# Patient Record
Sex: Female | Born: 1970
Health system: Southern US, Community
[De-identification: ages and names within clinical notes are randomized; demographics above are authoritative.]

## PROBLEM LIST (undated history)

## (undated) DIAGNOSIS — T7840XA Allergy, unspecified, initial encounter: Secondary | ICD-10-CM

## (undated) DIAGNOSIS — M51369 Other intervertebral disc degeneration, lumbar region without mention of lumbar back pain or lower extremity pain: Secondary | ICD-10-CM

## (undated) DIAGNOSIS — I471 Supraventricular tachycardia, unspecified: Secondary | ICD-10-CM

## (undated) DIAGNOSIS — M489 Spondylopathy, unspecified: Secondary | ICD-10-CM

## (undated) DIAGNOSIS — J189 Pneumonia, unspecified organism: Secondary | ICD-10-CM

## (undated) DIAGNOSIS — R634 Abnormal weight loss: Secondary | ICD-10-CM

## (undated) DIAGNOSIS — G43909 Migraine, unspecified, not intractable, without status migrainosus: Secondary | ICD-10-CM

## (undated) DIAGNOSIS — M1990 Unspecified osteoarthritis, unspecified site: Secondary | ICD-10-CM

## (undated) DIAGNOSIS — M5136 Other intervertebral disc degeneration, lumbar region: Secondary | ICD-10-CM

## (undated) DIAGNOSIS — I8390 Asymptomatic varicose veins of unspecified lower extremity: Secondary | ICD-10-CM

## (undated) DIAGNOSIS — I10 Essential (primary) hypertension: Secondary | ICD-10-CM

## (undated) DIAGNOSIS — M539 Dorsopathy, unspecified: Secondary | ICD-10-CM

## (undated) DIAGNOSIS — Z9889 Other specified postprocedural states: Secondary | ICD-10-CM

## (undated) DIAGNOSIS — E739 Lactose intolerance, unspecified: Secondary | ICD-10-CM

## (undated) DIAGNOSIS — M549 Dorsalgia, unspecified: Secondary | ICD-10-CM

## (undated) DIAGNOSIS — E119 Type 2 diabetes mellitus without complications: Secondary | ICD-10-CM

## (undated) DIAGNOSIS — D649 Anemia, unspecified: Secondary | ICD-10-CM

## (undated) DIAGNOSIS — M5126 Other intervertebral disc displacement, lumbar region: Secondary | ICD-10-CM

## (undated) DIAGNOSIS — K219 Gastro-esophageal reflux disease without esophagitis: Secondary | ICD-10-CM

## (undated) DIAGNOSIS — D689 Coagulation defect, unspecified: Secondary | ICD-10-CM

## (undated) DIAGNOSIS — E559 Vitamin D deficiency, unspecified: Secondary | ICD-10-CM

## (undated) DIAGNOSIS — M2550 Pain in unspecified joint: Secondary | ICD-10-CM

## (undated) DIAGNOSIS — E669 Obesity, unspecified: Secondary | ICD-10-CM

## (undated) DIAGNOSIS — M533 Sacrococcygeal disorders, not elsewhere classified: Secondary | ICD-10-CM

## (undated) DIAGNOSIS — R112 Nausea with vomiting, unspecified: Secondary | ICD-10-CM

## (undated) HISTORY — DX: Other intervertebral disc displacement, lumbar region: M51.26

## (undated) HISTORY — DX: Type 2 diabetes mellitus without complications: E11.9

## (undated) HISTORY — DX: Unspecified osteoarthritis, unspecified site: M19.90

## (undated) HISTORY — DX: Essential (primary) hypertension: I10

## (undated) HISTORY — DX: Supraventricular tachycardia, unspecified: I47.10

## (undated) HISTORY — DX: Pneumonia, unspecified organism: J18.9

## (undated) HISTORY — DX: Other intervertebral disc degeneration, lumbar region: M51.36

## (undated) HISTORY — DX: Dorsopathy, unspecified: M53.9

## (undated) HISTORY — DX: Dorsalgia, unspecified: M54.9

## (undated) HISTORY — DX: Other intervertebral disc degeneration, lumbar region without mention of lumbar back pain or lower extremity pain: M51.369

## (undated) HISTORY — DX: Allergy, unspecified, initial encounter: T78.40XA

## (undated) HISTORY — DX: Pain in unspecified joint: M25.50

## (undated) HISTORY — DX: Abnormal weight loss: R63.4

## (undated) HISTORY — DX: Vitamin D deficiency, unspecified: E55.9

## (undated) HISTORY — DX: Lactose intolerance, unspecified: E73.9

## (undated) HISTORY — DX: Sacrococcygeal disorders, not elsewhere classified: M53.3

## (undated) HISTORY — DX: Asymptomatic varicose veins of unspecified lower extremity: I83.90

## (undated) HISTORY — DX: Supraventricular tachycardia: I47.1

## (undated) HISTORY — DX: Obesity, unspecified: E66.9

## (undated) HISTORY — DX: Migraine, unspecified, not intractable, without status migrainosus: G43.909

## (undated) HISTORY — DX: Coagulation defect, unspecified: D68.9

## (undated) HISTORY — DX: Spondylopathy, unspecified: M48.9

## (undated) HISTORY — DX: Gastro-esophageal reflux disease without esophagitis: K21.9

---

## 1994-06-22 HISTORY — PX: TUBAL LIGATION: SHX77

## 2001-09-20 ENCOUNTER — Encounter: Payer: Self-pay | Admitting: Emergency Medicine

## 2001-09-20 ENCOUNTER — Emergency Department (HOSPITAL_COMMUNITY): Admission: EM | Admit: 2001-09-20 | Discharge: 2001-09-20 | Payer: Self-pay | Admitting: *Deleted

## 2002-06-05 ENCOUNTER — Encounter: Payer: Self-pay | Admitting: Otolaryngology

## 2002-06-05 ENCOUNTER — Ambulatory Visit (HOSPITAL_COMMUNITY): Admission: RE | Admit: 2002-06-05 | Discharge: 2002-06-05 | Payer: Self-pay | Admitting: Otolaryngology

## 2004-03-06 ENCOUNTER — Emergency Department (HOSPITAL_COMMUNITY): Admission: EM | Admit: 2004-03-06 | Discharge: 2004-03-06 | Payer: Self-pay | Admitting: Emergency Medicine

## 2004-05-02 ENCOUNTER — Emergency Department (HOSPITAL_COMMUNITY): Admission: EM | Admit: 2004-05-02 | Discharge: 2004-05-02 | Payer: Self-pay | Admitting: Emergency Medicine

## 2004-05-13 ENCOUNTER — Ambulatory Visit (HOSPITAL_COMMUNITY): Admission: RE | Admit: 2004-05-13 | Discharge: 2004-05-13 | Payer: Self-pay | Admitting: Internal Medicine

## 2004-12-19 ENCOUNTER — Encounter: Admission: RE | Admit: 2004-12-19 | Discharge: 2004-12-19 | Payer: Self-pay | Admitting: Internal Medicine

## 2005-06-06 ENCOUNTER — Emergency Department (HOSPITAL_COMMUNITY): Admission: EM | Admit: 2005-06-06 | Discharge: 2005-06-06 | Payer: Self-pay | Admitting: Emergency Medicine

## 2005-06-11 ENCOUNTER — Ambulatory Visit: Payer: Self-pay | Admitting: Orthopedic Surgery

## 2005-06-19 ENCOUNTER — Ambulatory Visit (HOSPITAL_COMMUNITY): Admission: RE | Admit: 2005-06-19 | Discharge: 2005-06-19 | Payer: Self-pay | Admitting: Orthopaedic Surgery

## 2005-06-19 ENCOUNTER — Encounter: Payer: Self-pay | Admitting: Family Medicine

## 2005-06-22 DIAGNOSIS — J189 Pneumonia, unspecified organism: Secondary | ICD-10-CM

## 2005-06-22 DIAGNOSIS — G43909 Migraine, unspecified, not intractable, without status migrainosus: Secondary | ICD-10-CM

## 2005-06-22 HISTORY — DX: Migraine, unspecified, not intractable, without status migrainosus: G43.909

## 2005-06-22 HISTORY — DX: Pneumonia, unspecified organism: J18.9

## 2005-07-02 ENCOUNTER — Ambulatory Visit: Payer: Self-pay | Admitting: Orthopedic Surgery

## 2005-08-21 ENCOUNTER — Ambulatory Visit (HOSPITAL_COMMUNITY): Admission: RE | Admit: 2005-08-21 | Discharge: 2005-08-21 | Payer: Self-pay | Admitting: Neurosurgery

## 2005-08-21 ENCOUNTER — Encounter: Payer: Self-pay | Admitting: Family Medicine

## 2005-09-08 ENCOUNTER — Inpatient Hospital Stay (HOSPITAL_COMMUNITY): Admission: EM | Admit: 2005-09-08 | Discharge: 2005-09-15 | Payer: Self-pay | Admitting: Emergency Medicine

## 2005-09-08 ENCOUNTER — Ambulatory Visit: Payer: Self-pay | Admitting: Cardiology

## 2005-09-08 ENCOUNTER — Ambulatory Visit: Payer: Self-pay | Admitting: Internal Medicine

## 2005-09-10 ENCOUNTER — Encounter: Payer: Self-pay | Admitting: Family Medicine

## 2005-09-11 ENCOUNTER — Encounter (INDEPENDENT_AMBULATORY_CARE_PROVIDER_SITE_OTHER): Payer: Self-pay | Admitting: Specialist

## 2005-09-15 ENCOUNTER — Encounter: Payer: Self-pay | Admitting: Family Medicine

## 2005-09-22 ENCOUNTER — Ambulatory Visit (HOSPITAL_COMMUNITY): Admission: RE | Admit: 2005-09-22 | Discharge: 2005-09-22 | Payer: Self-pay | Admitting: Internal Medicine

## 2005-10-05 ENCOUNTER — Ambulatory Visit: Payer: Self-pay | Admitting: Internal Medicine

## 2005-10-13 ENCOUNTER — Encounter (HOSPITAL_COMMUNITY): Admission: RE | Admit: 2005-10-13 | Discharge: 2005-11-12 | Payer: Self-pay | Admitting: Neurology

## 2005-10-13 ENCOUNTER — Ambulatory Visit (HOSPITAL_COMMUNITY): Payer: Self-pay | Admitting: Neurology

## 2005-10-21 ENCOUNTER — Emergency Department (HOSPITAL_COMMUNITY): Admission: EM | Admit: 2005-10-21 | Discharge: 2005-10-21 | Payer: Self-pay | Admitting: Emergency Medicine

## 2006-04-20 ENCOUNTER — Emergency Department (HOSPITAL_COMMUNITY): Admission: EM | Admit: 2006-04-20 | Discharge: 2006-04-20 | Payer: Self-pay | Admitting: Emergency Medicine

## 2008-06-10 ENCOUNTER — Emergency Department (HOSPITAL_COMMUNITY): Admission: EM | Admit: 2008-06-10 | Discharge: 2008-06-10 | Payer: Self-pay | Admitting: Emergency Medicine

## 2009-04-24 ENCOUNTER — Encounter (INDEPENDENT_AMBULATORY_CARE_PROVIDER_SITE_OTHER): Payer: Self-pay | Admitting: *Deleted

## 2009-04-24 ENCOUNTER — Emergency Department (HOSPITAL_COMMUNITY): Admission: EM | Admit: 2009-04-24 | Discharge: 2009-04-24 | Payer: Self-pay | Admitting: Emergency Medicine

## 2009-04-24 LAB — CONVERTED CEMR LAB
BUN: 13 mg/dL
CO2: 24 meq/L
Creatinine, Ser: 0.84 mg/dL
GFR calc non Af Amer: >60 mL/min
Glomerular Filtration Rate, Af Am: >60 mL/min/{1.73_m2}
Glucose, Bld: 91 mg/dL
HCT: 44.3 %
Hemoglobin: 15 g/dL
Sodium: 139 meq/L
Troponin I: <0.05 ng/mL
WBC: 7.5 10*3/uL

## 2009-05-02 DIAGNOSIS — I152 Hypertension secondary to endocrine disorders: Secondary | ICD-10-CM | POA: Insufficient documentation

## 2009-05-02 DIAGNOSIS — J189 Pneumonia, unspecified organism: Secondary | ICD-10-CM | POA: Insufficient documentation

## 2009-05-02 DIAGNOSIS — I10 Essential (primary) hypertension: Secondary | ICD-10-CM | POA: Insufficient documentation

## 2009-05-02 DIAGNOSIS — I498 Other specified cardiac arrhythmias: Secondary | ICD-10-CM | POA: Insufficient documentation

## 2009-05-02 DIAGNOSIS — E1159 Type 2 diabetes mellitus with other circulatory complications: Secondary | ICD-10-CM | POA: Insufficient documentation

## 2009-05-02 DIAGNOSIS — R634 Abnormal weight loss: Secondary | ICD-10-CM | POA: Insufficient documentation

## 2009-05-03 ENCOUNTER — Encounter (INDEPENDENT_AMBULATORY_CARE_PROVIDER_SITE_OTHER): Payer: Self-pay | Admitting: *Deleted

## 2010-02-10 ENCOUNTER — Ambulatory Visit: Payer: Self-pay | Admitting: Family Medicine

## 2010-02-10 DIAGNOSIS — R1013 Epigastric pain: Secondary | ICD-10-CM

## 2010-02-10 DIAGNOSIS — R5383 Other fatigue: Secondary | ICD-10-CM | POA: Insufficient documentation

## 2010-02-10 DIAGNOSIS — E669 Obesity, unspecified: Secondary | ICD-10-CM | POA: Insufficient documentation

## 2010-02-10 DIAGNOSIS — N926 Irregular menstruation, unspecified: Secondary | ICD-10-CM | POA: Insufficient documentation

## 2010-02-10 DIAGNOSIS — K3189 Other diseases of stomach and duodenum: Secondary | ICD-10-CM | POA: Insufficient documentation

## 2010-02-10 DIAGNOSIS — R5381 Other malaise: Secondary | ICD-10-CM | POA: Insufficient documentation

## 2010-02-10 DIAGNOSIS — E049 Nontoxic goiter, unspecified: Secondary | ICD-10-CM | POA: Insufficient documentation

## 2010-02-11 ENCOUNTER — Encounter: Payer: Self-pay | Admitting: Family Medicine

## 2010-02-16 DIAGNOSIS — L259 Unspecified contact dermatitis, unspecified cause: Secondary | ICD-10-CM | POA: Insufficient documentation

## 2010-02-16 DIAGNOSIS — R9409 Abnormal results of other function studies of central nervous system: Secondary | ICD-10-CM | POA: Insufficient documentation

## 2010-02-16 DIAGNOSIS — B359 Dermatophytosis, unspecified: Secondary | ICD-10-CM | POA: Insufficient documentation

## 2010-02-17 LAB — CONVERTED CEMR LAB
AST: 19 units/L (ref 0–37)
Albumin: 4.4 g/dL (ref 3.5–5.2)
Alkaline Phosphatase: 74 units/L (ref 39–117)
Bilirubin, Direct: 0.2 mg/dL (ref 0.0–0.3)
CO2: 23 meq/L (ref 19–32)
Calcium: 9.4 mg/dL (ref 8.4–10.5)
Chloride: 100 meq/L (ref 96–112)
Creatinine, Ser: 0.91 mg/dL (ref 0.40–1.20)
Glucose, Bld: 95 mg/dL (ref 70–99)
HDL: 37 mg/dL — ABNORMAL LOW (ref >39)
Helicobacter Pylori Antibody-IgG: 0.7
LDL Cholesterol: 91 mg/dL (ref 0–99)
TSH: 2.175 microintl units/mL (ref 0.350–4.500)
Total Bilirubin: 0.4 mg/dL (ref 0.3–1.2)

## 2010-02-25 ENCOUNTER — Ambulatory Visit (HOSPITAL_COMMUNITY): Admission: RE | Admit: 2010-02-25 | Discharge: 2010-02-25 | Payer: Self-pay | Admitting: Family Medicine

## 2010-03-13 ENCOUNTER — Encounter: Payer: Self-pay | Admitting: Family Medicine

## 2010-05-07 ENCOUNTER — Other Ambulatory Visit: Admission: RE | Admit: 2010-05-07 | Discharge: 2010-05-07 | Payer: Self-pay | Admitting: Family Medicine

## 2010-05-07 ENCOUNTER — Ambulatory Visit: Payer: Self-pay | Admitting: Family Medicine

## 2010-05-07 DIAGNOSIS — L909 Atrophic disorder of skin, unspecified: Secondary | ICD-10-CM | POA: Insufficient documentation

## 2010-05-07 DIAGNOSIS — L919 Hypertrophic disorder of the skin, unspecified: Secondary | ICD-10-CM

## 2010-05-12 ENCOUNTER — Ambulatory Visit (HOSPITAL_COMMUNITY): Admission: RE | Admit: 2010-05-12 | Discharge: 2010-05-12 | Payer: Self-pay | Admitting: Family Medicine

## 2010-05-12 LAB — CONVERTED CEMR LAB: Pap Smear: NEGATIVE

## 2010-05-13 ENCOUNTER — Encounter: Payer: Self-pay | Admitting: Family Medicine

## 2010-05-17 DIAGNOSIS — B369 Superficial mycosis, unspecified: Secondary | ICD-10-CM | POA: Insufficient documentation

## 2010-05-17 DIAGNOSIS — E1169 Type 2 diabetes mellitus with other specified complication: Secondary | ICD-10-CM | POA: Insufficient documentation

## 2010-05-17 DIAGNOSIS — E669 Obesity, unspecified: Secondary | ICD-10-CM

## 2010-05-20 ENCOUNTER — Ambulatory Visit (HOSPITAL_COMMUNITY): Admission: RE | Admit: 2010-05-20 | Discharge: 2010-05-20 | Payer: Self-pay | Admitting: Diagnostic Neuroimaging

## 2010-05-31 ENCOUNTER — Encounter: Payer: Self-pay | Admitting: Family Medicine

## 2010-07-13 ENCOUNTER — Encounter: Payer: Self-pay | Admitting: Family Medicine

## 2010-07-24 NOTE — Letter (Signed)
Summary: dermatology  dermatology   Imported By: Lind Guest 06/09/2010 13:19:26  _____________________________________________________________________  External Attachment:    Type:   Image     Comment:   External Document

## 2010-07-24 NOTE — Letter (Signed)
Summary: Discharge Summary  Discharge Summary   Imported By: Lind Guest 02/11/2010 08:48:39  _____________________________________________________________________  External Attachment:    Type:   Image     Comment:   External Document

## 2010-07-24 NOTE — Assessment & Plan Note (Signed)
Summary: new pt per dr. Lodema Hong   Vital Signs:  Patient profile:   40 year old female Menstrual status:  regular LMP:     01/20/2010 Height:      71 inches Weight:      280.25 pounds BMI:     39.23 O2 Sat:      98 % Pulse rate:   71 / minute Pulse rhythm:   regular Resp:     16 per minute BP sitting:   138 / 86  (left arm) Cuff size:   large  Vitals Entered By: Everitt Amber LPN (February 10, 2010 1:10 PM)  Nutrition Counseling: Patient's BMI is greater than 25 and therefore counseled on weight management options. CC: New patient  LMP (date): 01/20/2010     Menstrual Status regular Enter LMP: 01/20/2010   CC:  New patient .  History of Present Illness: New pt evaluation foir this pt with HTN,obesity, h/o abn brain scan whjo was told she had MS in the past. She has recently joinewd weight watchers and i9s doing very well so far. In the past she joined a physician directed weight loss program, lost an excessive amt of weight rapidly, developed protein malnutrtion and required hospitalisation asa result. She had multiple symptoms including abn in her neurologic sysytem, apart from a markedly abn brain scan she was told by 1 neurologist that she appeared to have mS, another told her no such thing. She denies vision disturbance , but reports intermittent tingling and numnbbness iun the extremity. She does HAVE A POSITIVR FAMILY H/O DIabetes, but has never been dx with this She has not started regular physical activity, butintends to add this to her weight loss plan  Current Medications (verified): 1)  Diovan Hct 160-12.5 Mg Tabs (Valsartan-Hydrochlorothiazide) .... Take 1 Tab Daily  Allergies (verified): 1)  ! Codeine 2)  ! Reglan  Past History:  Social History: Last updated: 02/10/2010 Married  x 14 years two daughters ages 74 and 40 Tobacco Use - No.  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  Risk  Factors: Exercise: no (05/02/2009)  Risk Factors: Smoking Status: never (05/02/2009)  Past Medical History: Current Problems:  HYPERTENSION (ICD-401.9) since 2001 SUPRAVENTRICULAR TACHYCARDIA (ICD-427.89) occured in 04/2009 on the job, pt was stressed WEIGHT LOSS (ICD-783.21), hospitalised 4 yrs ago aftyer a 200 pound weight loss in 1 year to 1256 pounds on a supervised program, malnourished with severe protein deficiency, spent 1 week PNEUMONIA (ICD-486) hospitalised in 2007 for this at the same time she was in for malnutrtion Obesity since childhood Migraine headaches, most recent 2007 posssible MS dx in 2007 but uncertain of this, maybe not proven Spinal disease  Past Surgical History: Tubal ligation x  1996  Family History: Mother-living-HTN, diabetes, had a heart blockage and uterine cancer-finished chemo last year, CABG x 1 in her 71's Father-Living-HTN, had prostate cancer age 26 in 2011, has bradycardia and an irregular rate 2 brothers-living-HTN, diabetes (both), one brother had tachycardia in his teens and required surgery to correct this , was done in his 60's in [place]  Social History: Married  x 14 years two daughters ages 22 and 21 Tobacco Use - No.  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 Eyes:  Denies blurring, discharge, eye pain, and red eye. ENT:  Denies hoarseness, nasal congestion, postnasal drainage, sinus pressure, and sore throat. CV:  Denies chest pain or discomfort, difficulty breathing while lying down, lightheadness, palpitations, shortness of breath with exertion, and swelling of feet. Resp:  Denies cough, sputum productive, and wheezing. GI:  Complains of abdominal pain; mild dyspeptic , lactose intolerant. GU:  Complains of abnormal vaginal bleeding; denies discharge, dysuria,  incontinence, and urinary frequency; heavy cycles on avg 5 to 7 days, flood x 3 days  needing double protection, spotting this month. MS:  Complains of low back pain; LBP sometime radiates to right lower ext over 15 yrs, flares intermittently, thinks since she was a teen. Derm:  Complains of itching and rash; bilateral puritic rasch underboth breasts. Neuro:  Complains of headaches, numbness, and tingling; h/o numbness in right leg several yrs ago and abn MRI brain, at one ime felt to have MS, occassional tingling still. Psych:  Denies anxiety, depression, and mental problems. Endo:  Denies cold intolerance, excessive thirst, excessive urination, and heat intolerance. Heme:  Denies abnormal bruising and bleeding. Allergy:  Complains of seasonal allergies; spring primarily pollemn aggravates.  Physical Exam  General:  Well-developed,obese,in no acute distress; alert,appropriate and cooperative throughout examination HEENT: No facial asymmetry,  EOMI, No sinus tenderness, TM's Clear, oropharynx  pink and moist.Goiter   Chest: Clear to auscultation bilaterally.  CVS: S1, S2, No murmurs, No S3.   Abd: Soft, Nontender.  MS: Adequate ROM spine, hips, shoulders and knees.  Ext: No edema.   CNS: CN 2-12 intact, power tone and sensation normal throughout.   Skin: Intact, fungal infection under breast Psych: Good eye contact, normal affect.  Memory intact, not anxious or depressed appearing.    Impression & Recommendations:  Problem # 1:  OTH ABNORMAL BRAIN & CNS FUNCTION STUDY (ICD-794.09) Assessment Comment Only  Orders: Radiology Referral (Radiology)  Problem # 2:  IMPAIRED FASTING GLUCOSE (ICD-790.21) Assessment: Comment Only  Orders: T- Hemoglobin A1C (83036-23375)6.54, will need to follow low carb diet , start regular physical activity and lose weight  Problem # 3:  GOITER, UNSPECIFIED (ICD-240.9) Assessment: Comment Only  Orders: Radiology Referral (Radiology)  Problem # 4:   IRREGULAR MENSES (ICD-626.4) Assessment: Comment Only  Orders: Radiology Referral (Radiology)  Problem # 5:  HYPERTENSION (ICD-401.9) Assessment: Comment Only  The following medications were removed from the medication list:    Diovan Hct 160-12.5 Mg Tabs (Valsartan-hydrochlorothiazide) .Marland Kitchen... Take 1 tab daily Her updated medication list for this problem includes:    Diovan Hct 160-25 Mg Tabs (Valsartan-hydrochlorothiazide) .Marland Kitchen... Take 1 tablet by mouth once a day  Orders: T-Basic Metabolic Panel [id number])  BP today: 138/86  Labs Reviewed: K+: 3.4 (04/24/2009) Creat: : 0.84 (04/24/2009)     Problem # 6:  DERMATOPHYTOSIS OF UNSPECIFIED SITE (ICD-110.9) Assessment: Comment Only clotrimazole/betameth prescribed  Complete Medication List: 1)  Diovan Hct 160-25 Mg Tabs (Valsartan-hydrochlorothiazide) .... Take 1 tablet by mouth once a day 2)  Clotrimazole-betamethasone 1-0.05 % Crea (Clotrimazole-betamethasone) .... Apply twice daily for 10 days to affected areas, then as needed  Other Orders: T-Hepatic Function [id number]) T-Lipid Profile [id number]) T-TSH [id number]) TLB-H. Pylori Abs(Helicobacter Pylori) ([id number])  Patient Instructions: 1)  CPE in3rd week in October. 2)  BMP prior to visit, ICD-9: 3)  Lipid Panel prior to visit, ICD-9: 4)  TSH prior to visit, ICD-9: 5)  CBC w/ Diff prior to visit, ICD-9:   fasting asap 6)  HbgA1C prior to visit, ICD-9: 7)  Vitamin D 8)  It is important that you exercise regularly at least 30 minutes 7 times a week. If you develop chest pain, have severe difficulty breathing, or feel very tired , stop exercising immediately and seek medical attention. 9)  You need to lose weight. Consider a lower calorie diet and regular exercise. Congrats  on new resolve to lose weight healthily. 10)  You are referred for mamo, pelvic and thyroid ultrasounds Prescriptions: CLOTRIMAZOLE-BETAMETHASONE 1-0.05 % CREA  (CLOTRIMAZOLE-BETAMETHASONE) apply twice daily for 10 days to affected areas, then as needed  #45 gm x 2   Entered and Authorized by:   Syliva Overman MD   Signed by:   Syliva Overman MD on 02/16/2010   Method used:   Historical   RxID:   [id number] DIOVAN HCT 160-25 MG TABS (VALSARTAN-HYDROCHLOROTHIAZIDE) Take 1 tablet by mouth once a day  #90 x 1   Entered and Authorized by:   Syliva Overman MD   Signed by:   Syliva Overman MD on 02/10/2010   Method used:   Print then Give to Patient   RxID:   [id number]

## 2010-07-24 NOTE — Miscellaneous (Signed)
  Clinical Lists Changes  Problems: Added new problem of OTH ABNORMAL BRAIN & CNS FUNCTION STUDY (ICD-794.09) Medications: Added new medication of PENICILLIN V POTASSIUM 500 MG TABS (PENICILLIN V POTASSIUM) Take 1 tablet by mouth three times a day - Signed Rx of PENICILLIN V POTASSIUM 500 MG TABS (PENICILLIN V POTASSIUM) Take 1 tablet by mouth three times a day;  #63 x 0;  Signed;  Entered by: Syliva Overman MD;  Authorized by: Syliva Overman MD;  Method used: Electronically to [facility] [address] [phone]*, [address], [place], [place]  [address], Ph: [phone], Fax: [phone] Orders: Added new Referral order of Neurology Referral (Neuro) - Signed    Prescriptions: PENICILLIN V POTASSIUM 500 MG TABS (PENICILLIN V POTASSIUM) Take 1 tablet by mouth three times a day  #63 x 0   Entered and Authorized by:   Syliva Overman MD   Signed by:   Syliva Overman MD on 03/13/2010   Method used:   Electronically to        [facility] [address] [phone]* (retail)       [address] [place]  [address]       Ph: [phone]       Fax: [phone]   RxID:   [id number]

## 2010-07-24 NOTE — Letter (Signed)
Summary: [facility]   Imported By: Lind Guest 05/14/2010 10:45:22  _____________________________________________________________________  External Attachment:    Type:   Image     Comment:   External Document

## 2010-07-24 NOTE — Assessment & Plan Note (Signed)
Summary: PHY   Vital Signs:  Patient profile:   40 year old female Menstrual status:  regular Height:      71 inches Weight:      262 pounds BMI:     36.67 O2 Sat:      94 % on Room air Pulse rate:   91 / minute Pulse rhythm:   irregular Resp:     16 per minute BP sitting:   104 / 80  (left arm)  Vitals Entered By: Mauricia Area CMA (May 07, 2010 2:31 PM)  Nutrition Counseling: Patient's BMI is greater than 25 and therefore counseled on weight management options.  O2 Flow:  Room air CC: physical  Vision Screening:Left eye w/o correction: 20 / 13 Right Eye w/o correction: 20 / 20 Both eyes w/o correction:  20/ 10        Vision Entered By: Mauricia Area CMA (May 07, 2010 2:40 PM)   CC:  physical.  History of Present Illness: Reports  that she has been doing very well. She has been fllowing a reduced calorie diet with excellent results. Denies recent fever or chills. Denies sinus pressure, nasal congestion , ear pain or sore throat. Denies chest congestion, or cough productive of sputum. Denies chest pain, palpitations, PND, orthopnea or leg swelling. Denies abdominal pain, nausea, vomitting, diarrhea or constipation. Denies change in bowel movements or bloody stool. Denies dysuria , frequency, incontinence or hesitancy. Denies  joint pain, swelling, or reduced mobility. Denies headaches, vertigo, seizures. Denies depression, anxiety or insomnia. c/o mutiple skin tags which she wants removed since they are irritating her.     Current Medications (verified): 1)  Diovan Hct 160-25 Mg Tabs (Valsartan-Hydrochlorothiazide) .... Take 1 Tablet By Mouth Once A Day 2)  Clotrimazole-Betamethasone 1-0.05 % Crea (Clotrimazole-Betamethasone) .... Apply Twice Daily For 10 Days To Affected Areas, Then As Needed  Allergies (verified): 1)  ! Codeine 2)  ! Reglan  Review of Systems      See HPI General:  Complains of fatigue. Eyes:  Denies blurring and  discharge. Derm:  Complains of lesion(s). Endo:  Complains of weight change; denies cold intolerance, excessive thirst, excessive urination, and polyuria; intentional weight loss. Heme:  Denies abnormal bruising and bleeding. Allergy:  Denies hives or rash, itching eyes, and seasonal allergies.  Physical Exam  General:  Well-developed,obese,in no acute distress; alert,appropriate and cooperative throughout examination Head:  Normocephalic and atraumatic without obvious abnormalities. No apparent alopecia or balding. Eyes:  No corneal or conjunctival inflammation noted. EOMI. Perrla. Funduscopic exam benign, without hemorrhages, exudates or papilledema. Vision grossly normal. Ears:  External ear exam shows no significant lesions or deformities.  Otoscopic examination reveals clear canals, tympanic membranes are intact bilaterally without bulging, retraction, inflammation or discharge. Hearing is grossly normal bilaterally. Nose:  External nasal examination shows no deformity or inflammation. Nasal mucosa are pink and moist without lesions or exudates. Mouth:  Oral mucosa and oropharynx without lesions or exudates.  Teeth in good repair. Neck:  No deformities, masses, or tenderness noted. Chest Wall:  No deformities, masses, or tenderness noted. Breasts:  No mass, nodules, thickening, tenderness, bulging, retraction, inflamation, nipple discharge or skin changes noted.   Lungs:  Normal respiratory effort, chest expands symmetrically. Lungs are clear to auscultation, no crackles or wheezes. Heart:  Normal rate and regular rhythm. S1 and S2 normal without gallop, murmur, click, rub or other extra sounds. Abdomen:  Bowel sounds positive,abdomen soft and non-tender without masses, organomegaly or hernias noted. Rectal:  No external abnormalities noted. Normal sphincter tone. No rectal masses or tenderness. Genitalia:  Normal introitus for age, no external lesions, no vaginal discharge, mucosa pink and  moist, no vaginal or cervical lesions, no vaginal atrophy, no friaility or hemorrhage, normal uterus size and position, no adnexal masses or tenderness Msk:  No deformity or scoliosis noted of thoracic or lumbar spine.   Pulses:  R and L carotid,radial,femoral,dorsalis pedis and posterior tibial pulses are full and equal bilaterally Extremities:  No clubbing, cyanosis, edema, or deformity noted with normal full range of motion of all joints.   Neurologic:  No cranial nerve deficits noted. Station and gait are normal. Plantar reflexes are down-going bilaterally. DTRs are symmetrical throughout. Sensory, motor and coordinative functions appear intact. Skin:  multiple skin tags, also tinea pedis Cervical Nodes:  No lymphadenopathy noted Axillary Nodes:  No palpable lymphadenopathy Inguinal Nodes:  No significant adenopathy Psych:  Cognition and judgment appear intact. Alert and cooperative with normal attention span and concentration. No apparent delusions, illusions, hallucinations   Impression & Recommendations:  Problem # 1:  SKIN TAG (ICD-701.9) Assessment Comment Only  Orders: Dermatology Referral (Derma)  Problem # 2:  HYPERTENSION (ICD-401.9) Assessment: Improved  Her updated medication list for this problem includes:    Diovan Hct 160-25 Mg Tabs (Valsartan-hydrochlorothiazide) .Marland Kitchen... Take 1 tablet by mouth once a day  BP today: 104/80 Prior BP: 138/86 (02/10/2010)  Labs Reviewed: K+: 4.3 (02/11/2010) Creat: : 0.91 (02/11/2010)   Chol: 159 (02/11/2010)   HDL: 37 (02/11/2010)   LDL: 91 (02/11/2010)   TG: 156 (02/11/2010)  Problem # 3:  OBESITY, UNSPECIFIED (ICD-278.00) Assessment: Improved  Ht: 71 (05/07/2010)   Wt: 262 (05/07/2010)   BMI: 36.67 (05/07/2010)  Problem # 4:  DERMATOMYCOSIS (ICD-111.9) Assessment: Comment Only  Her updated medication list for this problem includes:    Clotrimazole-betamethasone 1-0.05 % Crea (Clotrimazole-betamethasone) .Marland Kitchen... Apply twice  daily for 10 days to affected areas, then as needed  Problem # 5:  PREDIABETES (ICD-790.29) Assessment: Comment Only  Labs Reviewed: Creat: 0.91 (02/11/2010)    hBA1c was 6.5 in August, Pt advised to reduce carbohydrate intake, espescially sweets, and to start regular physical activity, at least 30 minutes 5 days weekly, to enable weight loss, and reduce the risk of becoming diabetic   Complete Medication List: 1)  Diovan Hct 160-25 Mg Tabs (Valsartan-hydrochlorothiazide) .... Take 1 tablet by mouth once a day 2)  Clotrimazole-betamethasone 1-0.05 % Crea (Clotrimazole-betamethasone) .... Apply twice daily for 10 days to affected areas, then as needed  Other Orders: T-Basic Metabolic Panel [phone]) T- Hemoglobin A1C [phone]) T-Basic Metabolic Panel [id number]) T-Lipid Profile [id number]) T- Hemoglobin A1C ([id number]) Radiology Referral (Radiology) Hemoccult Guaiac-1 spec.(in office) ([id number]) Pap Smear ([id number])  Patient Instructions: 1)  Please schedule a follow-up appointment in 4 months. 2)  It is important that you exercise regularly at least 20 minutes 5 times a week. If you develop chest pain, have severe difficulty breathing, or feel very tired , stop exercising immediately and seek medical attention. 3)  You need to lose weight. Consider a lower calorie diet and regular exercise. CONGRATS 4)  HbgA1C prior to visit, ICD-9:  in 2 weeks and chem 7 fasting  first week dec 5)  pls keep appt with neurology. 6)  No med changes, except for med for fungal infection Prescriptions: DIOVAN HCT 160-25 MG TABS (VALSARTAN-HYDROCHLOROTHIAZIDE) Take 1 tablet by mouth once a day  #90 x 1   Entered by:   Adella Hare LPN  Authorized by:   Syliva Overman MD   Signed by:   Adella Hare LPN on 19/14/7829   Method used:   Electronically to        [facility]* (retail)       [address]       [address] Shipping/mailing       [place], [place]   [address]       Ph: [phone]       Fax: [phone]   RxID:   [id number]    Orders Added: 1)  Est. Patient 40-64 years [[id number]] 2)  T-Basic Metabolic Panel [phone] 3)  T- Hemoglobin A1C [[id number]] 4)  T-Basic Metabolic Panel [[id number]] 5)  T-Lipid Profile [[id number]] 6)  T- Hemoglobin A1C [83036-23375] 7)  Radiology Referral [Radiology] 8)  Hemoccult Guaiac-1 spec.(in office) [[id number]] 9)  Pap Smear [[id number]] 10)  Dermatology Referral [Derma]    Laboratory Results  Date/Time Received: May 07, 2010 3:28 PM  Date/Time Reported: May 07, 2010 3:28 PM   Stool - Occult Blood Hemmoccult #1: negative Date: 05/07/2010 Comments: [id number] 13L 10/13 118  10/12 [facility] LPN  May 07, 2010 3:28 PM

## 2010-08-25 ENCOUNTER — Encounter: Payer: Self-pay | Admitting: Family Medicine

## 2010-09-05 LAB — CONVERTED CEMR LAB
BUN: 10 mg/dL (ref 6–23)
CO2: 24 meq/L (ref 19–32)
Calcium: 8.9 mg/dL (ref 8.4–10.5)
Cholesterol: 156 mg/dL (ref 0–200)
Creatinine, Ser: 0.8 mg/dL (ref 0.40–1.20)
Glucose, Bld: 89 mg/dL (ref 70–99)
Hgb A1c MFr Bld: 6.3 % — ABNORMAL HIGH (ref <5.7)
Sodium: 141 meq/L (ref 135–145)

## 2010-09-08 ENCOUNTER — Encounter: Payer: Self-pay | Admitting: Family Medicine

## 2010-09-08 ENCOUNTER — Ambulatory Visit (INDEPENDENT_AMBULATORY_CARE_PROVIDER_SITE_OTHER): Payer: Self-pay | Admitting: Family Medicine

## 2010-09-08 DIAGNOSIS — R7309 Other abnormal glucose: Secondary | ICD-10-CM

## 2010-09-08 DIAGNOSIS — J309 Allergic rhinitis, unspecified: Secondary | ICD-10-CM

## 2010-09-08 DIAGNOSIS — E669 Obesity, unspecified: Secondary | ICD-10-CM

## 2010-09-08 DIAGNOSIS — I10 Essential (primary) hypertension: Secondary | ICD-10-CM

## 2010-09-09 ENCOUNTER — Encounter: Payer: Self-pay | Admitting: Family Medicine

## 2010-09-18 NOTE — Assessment & Plan Note (Signed)
Summary: f up   Vital Signs:  Patient profile:   40 year old female Menstrual status:  regular Height:      71 inches Weight:      257.75 pounds BMI:     36.08 O2 Sat:      97 % Pulse rate:   70 / minute Pulse rhythm:   regular Resp:     16 per minute BP sitting:   108 / 78  (left arm) Cuff size:   large  Vitals Entered By: Everitt Amber LPN (September 08, 2010 4:41 PM)  Nutrition Counseling: Patient's BMI is greater than 25 and therefore counseled on weight management options. CC: Follow up chronic problems   CC:  Follow up chronic problems.  History of Present Illness: Reports  that she is  doing well. Denies recent fever or chills. Denies sinus pressure, nasal congestion , ear pain or sore throat. Denies chest congestion, or cough productive of sputum. Denies chest pain, palpitations, PND, orthopnea or leg swelling. Denies abdominal pain, nausea, vomitting, diarrhea or constipation. Denies change in bowel movements or bloody stool. Denies dysuria , frequency, incontinence or hesitancy. Denies  joint pain, swelling, or reduced mobility. Denies headaches, vertigo, seizures. Denies depression, anxiety or insomnia. Denies  rash, lesions, or itch. Requiests nasal spray for uncontrolle allergies which have already std and will onl;y worsen     Current Medications (verified): 1)  Diovan Hct 160-25 Mg Tabs (Valsartan-Hydrochlorothiazide) .... Take 1 Tablet By Mouth Once A Day 2)  Clotrimazole-Betamethasone 1-0.05 % Crea (Clotrimazole-Betamethasone) .... Apply Twice Daily For 10 Days To Affected Areas, Then As Needed  Allergies (verified): 1)  ! Codeine 2)  ! Reglan  Review of Systems      See HPI Eyes:  Denies blurring and discharge; reportedly advised to have a formal eye exam by neurologist due to possible cataract. Neuro:  full eval by neuriologist conclusion is no Ms , but changes previously seen on mRI likely due to nutrtional deficiency at the time. Endo:  Denies cold  intolerance, excessive hunger, excessive thirst, and excessive urination. Heme:  Denies abnormal bruising, bleeding, and enlarge lymph nodes. Allergy:  Complains of seasonal allergies.  Physical Exam  General:  Well-developed,well-nourished,in no acute distress; alert,appropriate and cooperative throughout examination HEENT: No facial asymmetry,  EOMI, No sinus tenderness, TM's Clear, oropharynx  pink and moist.   Chest: Clear to auscultation bilaterally.  CVS: S1, S2, No murmurs, No S3.   Abd: Soft, Nontender.  MS: Adequate ROM spine, hips, shoulders and knees.  Ext: No edema.   CNS: CN 2-12 intact, power tone and sensation normal throughout.   Skin: Intact, no visible lesions or rashes.  Psych: Good eye contact, normal affect.  Memory intact, not anxious or depressed appearing.    Impression & Recommendations:  Problem # 1:  PREDIABETES (ICD-790.29) Assessment Improved  Orders: Ophthalmology Referral (Ophthalmology) T- Hemoglobin A1C ([id number])6.3 was 6.5  Labs Reviewed: Creat: 0.80 (09/05/2010)     Problem # 2:  HYPERTENSION (ICD-401.9) Assessment: Unchanged  Her updated medication list for this problem includes:    Diovan Hct 160-25 Mg Tabs (Valsartan-hydrochlorothiazide) .Marland Kitchen... Take 1 tablet by mouth once a day  Orders: T-Basic Metabolic Panel [id number])  BP today: 108/78 Prior BP: 104/80 (05/07/2010)  Labs Reviewed: K+: 4.3 (09/05/2010) Creat: : 0.80 (09/05/2010)   Chol: 156 (09/05/2010)   HDL: 45 (09/05/2010)   LDL: 88 (09/05/2010)   TG: 117 (09/05/2010)  Problem # 3:  ALLERGIC RHINITIS CAUSE UNSPECIFIED (ICD-477.9) Assessment: Deteriorated  Her updated medication list for this problem includes:    Fluticasone Propionate 50 Mcg/act Susp (Fluticasone propionate) ..... One to two puffs per nostril once daily for allergies  Problem # 4:  OBESITY (ICD-278.00) Assessment: Improved  Ht: 71 (09/08/2010)   Wt: 257.75 (09/08/2010)   BMI: 36.08  (09/08/2010) therapeutic lifestyle change discussed and encouraged  Complete Medication List: 1)  Diovan Hct 160-25 Mg Tabs (Valsartan-hydrochlorothiazide) .... Take 1 tablet by mouth once a day 2)  Clotrimazole-betamethasone 1-0.05 % Crea (Clotrimazole-betamethasone) .... Apply twice daily for 10 days to affected areas, then as needed 3)  Fluticasone Propionate 50 Mcg/act Susp (Fluticasone propionate) .... One to two puffs per nostril once daily for allergies  Other Orders: T-CBC w/Diff [id number]) T-TSH [id number]) T-Vitamin D (25-Hydroxy) [phone])  Patient Instructions: 1)  CPE and pap 11/18 or after. 2)  It is important that you exercise regularly at least 20 minutes 5 times a week. If you develop chest pain, have severe difficulty breathing, or feel very tired , stop exercising immediately and seek medical attention. 3)  You need to lose weight. Consider a lower calorie diet and regular exercise.  4)  HBA1C, Vit D and CBC in mid september, non fasting. 5)  chem 7 and TSH 6)  BOP is great, and your blood sugars have improved 7)  You are referred for eye exam Prescriptions: FLUTICASONE PROPIONATE 50 MCG/ACT SUSP (FLUTICASONE PROPIONATE) one to two puffs per nostril once daily for allergies  #3 x 2   Entered and Authorized by:   Syliva Overman MD   Signed by:   Syliva Overman MD on 09/08/2010   Method used:   Electronically to        [facility]* (retail)       [address]       [address] Shipping/mailing       [place], [place]  [address]       Ph: [phone]       Fax: [phone]   RxID:   [id number]    Orders Added: 1)  Ophthalmology Referral [Ophthalmology] 2)  T-Basic Metabolic Panel [phone] 3)  T-CBC w/Diff [[id number]] 4)  T-TSH [[id number]] 5)  T- Hemoglobin A1C [83036-23375] 6)  T-Vitamin D (25-Hydroxy) [[id number]] 7)  Est. Patient Level IV [27062]

## 2010-09-24 LAB — DIFFERENTIAL
Basophils Relative: 0 % (ref 0–1)
Eosinophils Absolute: 0.2 10*3/uL (ref 0.0–0.7)
Lymphs Abs: 2.8 10*3/uL (ref 0.7–4.0)
Monocytes Absolute: 1.1 10*3/uL — ABNORMAL HIGH (ref 0.1–1.0)
Monocytes Relative: 14 % — ABNORMAL HIGH (ref 3–12)
Neutro Abs: 3.4 10*3/uL (ref 1.7–7.7)
Neutrophils Relative %: 45 % (ref 43–77)

## 2010-09-24 LAB — POCT CARDIAC MARKERS
CKMB, poc: 1.2 ng/mL (ref 1.0–8.0)
Myoglobin, poc: 153 ng/mL (ref 12–200)

## 2010-09-24 LAB — BASIC METABOLIC PANEL
CO2: 24 mEq/L (ref 19–32)
Chloride: 105 mEq/L (ref 96–112)
Creatinine, Ser: 0.84 mg/dL (ref 0.4–1.2)
GFR calc Af Amer: >60 mL/min (ref >60)
Sodium: 139 mEq/L (ref 135–145)

## 2010-09-24 LAB — CBC
Hemoglobin: 15 g/dL (ref 12.0–15.0)
MCHC: 33.9 g/dL (ref 30.0–36.0)
MCV: 84.1 fL (ref 78.0–100.0)
RBC: 5.27 MIL/uL — ABNORMAL HIGH (ref 3.87–5.11)
WBC: 7.5 10*3/uL (ref 4.0–10.5)

## 2010-11-07 NOTE — Procedures (Signed)
[name], [name]             ACCOUNT NO.:  [id number]   MEDICAL RECORD NO.:  [id number]          PATIENT TYPE:  INP   LOCATION:  A203                          FACILITY:  [facility]   PHYSICIAN:  Olga Millers, M.D. [facility] OF BIRTH:  02-11-1971   DATE OF PROCEDURE:  09/09/2005  DATE OF DISCHARGE:                                  ECHOCARDIOGRAM   INDICATIONS:  The patient is a 40 year old female who has ascites.  The  study is performed to quantify left ventricular function.  It is technically  adequate.  The two-dimensional measurements are as follows:  Left  ventricular end-diastolic dimension is 4.3 cm, left ventricular end-systolic  dimension is 2.9 cm, the septum is 1.2 cm and the posterior wall is 1.1 cm.  The left atrium is 3.9 cm and the aorta is 2.8 cm.   The overall left ventricular function is normal with an estimated ejection  fraction of 65%.  There are no focal wall motion abnormalities noted.  The  left atrial size is normal.  There is evidence of an atrial septal aneurysm.  The right atrium and right ventricle are normal.  There is a small posterior  pericardial effusion.   The aortic valve is trileaflet and there is no aortic stenosis or aortic  insufficiency by Doppler.  The mitral valve is normal and there is trace  mitral regurgitation.  There is trace tricuspid and pulmonic insufficiency.   FINAL INTERPRETATION:  1.  Normal left ventricular function.  2.  Atrial septal aneurysm.  3.  Small posterior pericardial effusion.  4.  Trace mitral, tricuspid and pulmonic insufficiency.           ______________________________  Olga Millers, M.D. [facility]     BC/MEDQ  D:  09/09/2005  T:  09/10/2005  Job:  [id number]

## 2010-11-07 NOTE — H&P (Signed)
[name], [name]             ACCOUNT NO.:  [id number]   MEDICAL RECORD NO.:  [id number]          PATIENT TYPE:  INP   LOCATION:  A203                          FACILITY:  [facility]   PHYSICIAN:  Tesfaye D. [name], MD   DATE OF BIRTH:  06/28/1970   DATE OF ADMISSION:  09/08/2005  DATE OF DISCHARGE:  LH                                HISTORY & PHYSICAL   CHIEF COMPLAINT:  Abdominal distention.   HISTORY OF PRESENT ILLNESS:  This is a 40 year old female patient with  history of hypertension who came to the emergency room with above complaint.  The patient has lost significant amount of weight in the last one year. She  used to weigh over 300. Currently, she is around 130 pounds. However, the  patient claimed she gained about 30 pounds in about three days. She noticed  abdominal distention and pain in the right upper quadrant area. She has also  noticed being weak and nauseated. The patient has no vomiting. She has  epigastric pain. Pain sensation around her epigastric area. CT scan in the  emergency room was done which showed significant ascites with hepatomegaly.  The patient also had a low potassium. The patient was admitted for further  treatment and further evaluation.   PAST MEDICAL HISTORY:  1.  Hypertension.  2.  History of obesity about year ago.  3.  She has previously diabetes mellitus but improved after she lost the      weight.  4.  History of back pain and lower extremity pain, etiology not clear.      Patient is currently on evaluation.   CURRENT MEDICATIONS:  1.  Diovan/hydrochlorothiazide 165/12.5 mg 1 tablet p.o. daily  2.  Neurontin 300 mg p.o. nightly.   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 No  history of alcohol, tobacco or substance abuse.   REVIEW OF SYSTEMS:  The patient feels generally weak and fatigued. No  headache, fever, chills, cough, shortness of breath, dysuria, urgency or  frequency of urination.   PHYSICAL  EXAMINATION:  GENERAL:  The patient is alert, awake and clinically  sick looking with vitals on admission:  Blood pressure 110/57, pulse 157,  respiratory rate 22, temperature 99.1 degree Fahrenheit.  HEENT:  Pupils are equal and reactive.  NECK:  Supple.  CHEST:  Clear lung fields. Good air entry.  CARDIOVASCULAR:  First and second heart sounds heard. Tachycardic. There is  a systolic murmur, grade 3/6.  ABDOMEN:  Soft and lax. Bowel sounds are positive. There is flank fullness  and mid distention.  EXTREMITIES:  No leg edema.   ADMISSION LABORATORY DATA:  CBC:  WBC 6.1, hemoglobin 9.5, hematocrit 28.5,  platelets 173. Sodium 143, potassium 2.8, chloride 106, carbon dioxide 29,  glucose 80, BUN 13, creatinine 0.6, bilirubin 0.5, alkaline phosphatase 67,  AST 60, ALT 35, total protein 5.9, albumin 3.3 and calcium 8.4. Ammonium 54.   ASSESSMENT:  1.  Ascites and hepatomegaly. Etiology not clear at this time.  2.  Hypokalemia.  3.  Supraventricular tachycardia.  4.  History of hypertension.  5.  History of recent accelerated weight loss.   PLAN:  Will supplement patient with potassium. Will give patient Lopressor  to control her tachycardia. Will do GI consult. Will do cardiology consult.  Will monitor her electrolytes and liver functions.      Tesfaye D. [name], MD  Electronically Signed     TDF/MEDQ  D:  09/09/2005  T:  09/09/2005  Job:  [id number]

## 2010-11-07 NOTE — Consult Note (Signed)
NAMERYANN, PAULI             ACCOUNT NO.:  [id number]   MEDICAL RECORD NO.:  [id number]          PATIENT TYPE:  INP   LOCATION:  A203                          FACILITY:  [facility]   PHYSICIAN:  R. Roetta Sessions, M.D. DATE OF BIRTH:  11/13/1970   DATE OF CONSULTATION:  09/08/2005  DATE OF DISCHARGE:                                   CONSULTATION   REASON FOR CONSULTATION:  Hepatomegaly.   HISTORY OF PRESENT ILLNESS:  Naiya is a 40 year old African-American female  patient of Dr. [name] who presented to the emergency department today with  complaints of a 30 pound weight gain over the last 3 days and abdominal  discomfort. She states that she has been feeling fairly weak and fatigued  lately. Over the last 3 days she has gained 30 pounds. She complains of  abdominal distention with abdominal discomfort which she described as  burning pain. The majority of her pain is in the upper abdomen. She has  had some nausea but no vomiting. Denies any change in bowel movements. She  has chronic constipation and uses laxatives intermittently. Denies any  melena or rectal bleeding, dysuria, hematuria. Last menstrual cycle was in  February, 2007. Cycles have been regular. Denies any chest pain, shortness  of breath. She has had some numbness in her lower extremities and has seen a  neurologist. Further details unavailable.   MEDICATIONS AT HOME:  Diovan HCT 165/12.5 mg daily.   ALLERGIES:  CODEINE - causes nausea and vomiting.   PAST MEDICAL HISTORY:  1.  She was diagnosed with non-insulin-dependent diabetes mellitus several      years ago when she was obese. She weighed over 300 pounds. She last      approximately 200 pounds in 1 year and has kept it off for the past 1      year. No evidence of diabetes at this time.  2.  Hypertension.  3.  Prior tubal ligation.   FAMILY HISTORY:  Negative for colorectal cancer or chronic GI illnesses,  liver disease. Her father has been treated for  prostate cancer and is doing  well.   SOCIAL HISTORY:  She is married and has two children. She is currently  unemployed. She denies any alcohol or drug use. She does have a tattoo. She  smokes 5-6 cigarettes daily.   REVIEW OF SYSTEMS:  See history of present illness for GI. Cardiopulmonary -  no chest pain or shortness of breath. Constitutional - see HPI. See HPI for  GU.   PHYSICAL EXAMINATION:  VITAL SIGNS:  Weight 164.6. Height 5 feet, 11 inches.  Temperature 98.9, pulse 100, respiration 22, blood pressure 94/62.  GENERAL:  A pleasant young black female in no acute distress.  SKIN:  Warm and dry, no jaundice.  HEENT:  Conjunctivae are pink. Sclerae are non icteric. Oropharyngeal mucosa  moist and pink, no lesions, erythema or exudates.  NECK:  No lymphadenopathy, thyromegaly.  CHEST:  Lungs are clear to auscultation.  CARDIAC:  Regular rate and rhythm, normal S1, S2, no murmurs, rubs, or  gallops.  ABDOMEN:  Positive bowel sounds, distended  but soft. She has mild diffuse  tenderness to deep palpation but more so in the right upper quadrant.  Subjective guarding with palpation in the right upper quadrant. Liver edge  palpable below the right costal margin in the midclavicular line. Exam  somewhat limited due to patient discomfort. Spleen not appreciated.  EXTREMITIES:  No edema.   LABORATORY DATA:  White count 9400, hemoglobin 11, hematocrit 31.8, MCV  96.9, platelets 210,000. Sodium 143, potassium 2.8, BUN 13, creatinine 0.6,  glucose 80, total bilirubin 0.5, alkaline phosphatase 67, AST 60, ALT 35,  albumin 3.3, calcium 8.4. Urinalysis negative except for ketones. Chest x-  ray negative. CAT scan of the abdomen and pelvis with contrast revealed  ascites, mild peri-portal edema and hepatomegaly, sclerosis of the SI joints  bilaterally.   IMPRESSION:  The patient is a 40 year old lady with new onset ascites, upper  abdominal pain, mildly elevated AST and hypokalemia. She is  mildly anemic as  well. CT reveals evidence of hepatomegaly and ascites. Etiology of the  ascites is unknown at this time but differential includes malignancy,  peritoneal infection, metabolic, cirrhosis. Doubt she has significant heart  failure or nephrotic syndrome based on current clinical findings of labs.   RECOMMENDATIONS:  1.  TSH, INR, iron, ferritin.  2.  Liver function tests, Met-7 in the morning.  3.  Daily weights, agree with low sodium diet.  4.  Agree with IV Protonix.  5.  Review CT.  6.  She will need to have a diagnostic abdominal paracentesis. Will arrange      for this to be done by radiology with ultrasound guidance.  7.  Recommend pelvic/transvaginal ultrasound to assess ovaries and exclude      ovarian neoplasm.  8.  Further recommendations to follow.      Tana Coast, P.AJonathon Bellows, M.D.  Electronically Signed    LL/MEDQ  D:  09/08/2005  T:  09/08/2005  Job:  [id number]   cc:   Tesfaye D. [name], MD  Fax: [phone]

## 2010-11-07 NOTE — Group Therapy Note (Signed)
NAMEFATMATA, LEGERE             ACCOUNT NO.:  [id number]   MEDICAL RECORD NO.:  [id number]          PATIENT TYPE:  INP   LOCATION:  A203                          FACILITY:  [facility]   PHYSICIAN:  Kofi A. Gerilyn Pilgrim, M.D. DATE OF BIRTH:  1970-09-18   DATE OF PROCEDURE:  09/14/2005  DATE OF DISCHARGE:                                   PROGRESS NOTE   The patient does not report any new complaints, although she continues to  have episodic headaches which are relieved with Vicodin.  Blood test results  hemoglobin 8.9, platelets 161.  Vitamin B12 level 612, ferritin 85, TSH 1.5,  ammonia 54 and the repeat 35.  CPK 91.  Ceruloplasmin 35.  Homocystine 10.1.  HIV nonreactive.  Spinal fluid analysis:  A wbc 0, rbc 0, protein 15,  glucose 60.  RPR nonreactive.  ANA negative.  Cryptococcal antigen negative.   ASSESSMENT/PLAN:  Multiple symptoms with a large corpus callosum lesion in  the midline into the right.  Differential is likely multiple sclerosis,  although etiology appears less likely at this time.  There are other tests  pending, particularly the oligoclonal banding which will be followed up.      Kofi A. Gerilyn Pilgrim, M.D.  Electronically Signed     KAD/MEDQ  D:  09/14/2005  T:  09/14/2005  Job:  [id number]

## 2010-11-07 NOTE — Consult Note (Signed)
[name], [name]             ACCOUNT NO.:  [id number]   MEDICAL RECORD NO.:  [id number]          PATIENT TYPE:  INP   LOCATION:  A203                          FACILITY:  [facility]   PHYSICIAN:  Kofi A. Gerilyn Pilgrim, M.D. DATE OF BIRTH:  01-Jun-1971   DATE OF CONSULTATION:  09/11/2005  DATE OF DISCHARGE:                                   CONSULTATION   REFERRING PHYSICIAN:  Tesfaye D. [name], M.D.   REASON FOR CONSULTATION:  Abnormal MRI finding.   HISTORY OF PRESENT ILLNESS:  This is a 40 year old, African-American female  who presented to the hospital with abdominal distention and increased  weight.  She apparently had a 30 pound weight loss over the past year or  more.  She had some nausea, vomiting, epigastric pain and headaches.  She  indicated that she is not a headache person at baseline, at least not  recently.  She indicates as a child she would have frequent headaches, but  recently she may have severe migraines about once or twice a year.  Over the  past several days, however, she has had bifrontal headaches which resulted  in the patient obtaining an MRI scan.   PAST MEDICAL HISTORY:  1.  Hypertension.  2.  Obesity 1 year ago.  3.  Previously diagnosed with diabetes mellitus, but this resolved after      weight loss.  4.  History of back pain.  5.  Paresthesias involving the feet and legs.   MEDICATIONS ON ADMISSION:  Neurontin, Diovan/hydrochlorothiazide.   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  She works at  [employer].  She does smoke cigarettes.  No alcohol or illicit drug use.   REVIEW OF SYSTEMS:  As in history of present illness.  The patient reports  she had a lot of severe fatigue recently.  Significant weight loss.  She did  come to our office several months ago and had a nerve conduction test  because of right leg numbness and paresthesias.  Results are not available  at this time.  She also saw Dr. Romeo Apple because of back pain and leg  weakness.  MRI was done of the entire spine which essentially has been  unrevealing.  She did see a neurosurgeon who thought that the mild spinal  stenosis does not explain her symptoms.   PHYSICAL EXAMINATION:  GENERAL:  This is a thin lady who is currently in no  acute distress.  VITAL SIGNS:  Low-grade fevers and naturally T-max of 100.8, latest  temperature 100.3, pulse 82, respirations 20, blood pressure 89/64.  HEENT:  She does have oral stigmata from chronic tobacco use and poor  dentition.  NECK:  Supple.  ABDOMEN:  Currently soft.  EXTREMITIES:  No significant edema.  NEUROLOGIC:  She is awake and alert.  She converses well.  Speech, language  and cognition are intact.  Pupils equal round and reactive to light and  accommodation.  Visual fields are intact.  Extraocular movements full.  Facial muscle strength is symmetric.  Tongue is midline.  Uvula is midline.  She looks normal.  Motor examination shows normal tone, bulk and strength.  There is no pronator drift.  Coordination is also intact.  Reflexes are  slight diminished.  Plantar reflexes are downgoing.  Sensory examination is  normal to temperature and light touch.  Gait is normal.   LABORATORY DATA AND X-RAY FINDINGS:  Folate 12.4.  Vitamin B12 level 612.  Folate 12.  Cortisol a.m. level 9.1.  Urinalysis negative.  Blood cultures  pending with no growth after 2 days.  Urine cultures showed 50 colonies/mL.  ANA pending.  ESR 11.  CPK 91.  AST 60, ALT 35, Alk phos 67, ammonia 54.  TSH 1.5.   MRI of the brain was reviewed and shows about six to eight very tiny, deep  white matter hyperintensities on flare imaging.  There is a single  hyperintense lesion on diffusion lesion in the corpus callosum involving the  middle of the corpus callosum somewhat more to the right side, but the  middle actually might be involved on both sides.  The lesion is not seen on  any other scans including the flare scans.  The area does not  enhance with  gadolinium which was given.   ASSESSMENT:  1.  A lady with multiple symptoms.  Regarding the magnetic resonance imaging      findings, the differential for this lesion is typically multiple      sclerosis which is very high on the list.  Ischemic stroke is also on      the differential.  Additional considerations are malignancies      particularly primary central nervous system lymphoma in the setting of      human immunodeficiency virus positive.  Glioma is also a present      consideration, although less likely.  2.  She has had a lot of chronic symptoms which could definitely be      explained from multiple sclerosis.  The weight loss is very concerning,      however.   RECOMMENDATIONS:  1.  I think she should have a spinal tap to further assess her condition and      help obtain information regarding the possibility of the multiple      sclerosis or other processes.  2.  She has had some basic blood testing which so far has been negative.      Additional testing should include a RPR, HIV testing and homocystine      level.  3.  We are going to go ahead and treat her with thiamine for presumed      malnutrition as she appears quite malnourished.  4.  Further recommendations will be based on the initial evaluation.   Thanks for this consultation.      Kofi A. Gerilyn Pilgrim, M.D.  Electronically Signed     KAD/MEDQ  D:  09/11/2005  T:  09/11/2005  Job:  [id number]

## 2010-11-07 NOTE — Discharge Summary (Signed)
[name], [name]             ACCOUNT NO.:  [id number]   MEDICAL RECORD NO.:  [id number]          PATIENT TYPE:  INP   LOCATION:  A203                          FACILITY:  [facility]   PHYSICIAN:  Tesfaye D. [name], MD   DATE OF BIRTH:  1970-08-06   DATE OF ADMISSION:  09/08/2005  DATE OF DISCHARGE:  03/27/2007LH                                 DISCHARGE SUMMARY   DISCHARGE DIAGNOSES:  1.  Ascites and hepatomegaly, etiology unclear.  2.  Pneumonia.  3.  Weight loss.  4.  Supraventricular tachycardia.  5.  Multiple lesions on brain MRI, etiology not clear, to rule out multiple      sclerosis.  6.  History of hypertension.  7.  Back pain and lower extremity pain on evaluation etiology not clear at      the time of discharge.   DISCHARGE MEDICATIONS:  1.  Gabapentin 300 mg p.o. q.h.s.  2.  Protonix 40 mg p.o. daily.  3.  Metoprolol 25 mg b.i.d.  4.  Cefzil 500 mg p.o. b.i.d. x5 days.  5.  MiraLax 17 grams p.o. daily.   HOSPITAL COURSE:  This is a 40 year old female patient who was admitted due  to abdominal distention and weight loss. Patient lost over 300 pounds over a  year. The patient was on a diet and exercise. However she continues to lose  weight and has become very weak. Recently patient developed abdominal  distention and abdominal discomfort. She was admitted due to ascites and  hepatomegaly. While the patient was in the hospital she had low-grade fever  and a chest x-ray showed a pneumonia. The patient had a problem with  tachycardia for which she was started on a beta blocker.   The patient had a CT scan of the head due to her generalized weakness and  back and lower extremity pain. There were multiple brain lesions, etiology  not clear. The patient was evaluated by a neurologist and the possibility of  multiple sclerosis was entertained. The patient gradually improved and her  fever subsided and she was discharged to continue follow up as an  outpatient.      Tesfaye D. [name], MD  Electronically Signed     TDF/MEDQ  D:  10/14/2005  T:  10/14/2005  Job:  [id number]

## 2010-11-07 NOTE — Op Note (Signed)
Kelsey, Ortiz             ACCOUNT NO.:  [id number]   MEDICAL RECORD NO.:  [id number]          PATIENT TYPE:  INP   LOCATION:  A203                          FACILITY:  [facility]   PHYSICIAN:  Kofi A. Gerilyn Pilgrim, M.D. DATE OF BIRTH:  October 18, 1970   DATE OF PROCEDURE:  09/11/2005  DATE OF DISCHARGE:                                  PROCEDURE NOTE   PROCEDURE:  Lumbar spinal tap.   INDICATION:  A 40 year old lady who has corpus callosum lesions suspicious  for multiple sclerosis or lymphoma.   DESCRIPTION OF PROCEDURE:  Informed consent was obtained in the usual  fashion.  The patient was placed in the laying down fetal position and the  area prepped.  The L4-L5 interspace was localized and the area anesthetized.  A 22-gauage needle was passed and the spinal fluid was accessed during a  single attempt.  Postop fluid is clear.  The fluid was sent for routine  analysis, additionally oligoclonal banding, cryptococcal antigen cytology  and VDRL are also obtained.  She tolerated the procedure well.      Kofi A. Gerilyn Pilgrim, M.D.  Electronically Signed     KAD/MEDQ  D:  09/11/2005  T:  09/12/2005  Job:  [id number]

## 2010-11-07 NOTE — Group Therapy Note (Signed)
[name], [name]             ACCOUNT NO.:  [id number]   MEDICAL RECORD NO.:  [id number]          PATIENT TYPE:  INP   LOCATION:  A203                          FACILITY:  [facility]   PHYSICIAN:  Angus G. Renard Matter, MD   DATE OF BIRTH:  02/15/1971   DATE OF PROCEDURE:  09/13/2005  DATE OF DISCHARGE:                                   PROGRESS NOTE   SUBJECTIVE:  This patient does have ascites and hepatomegaly, history of SVT  and hypertension.  She had MRI of brain which showed focal area of  abnormality, but nothing to suggest neoplasm.  A chest CT showed lower lobe  opacities compatible with pneumonia.   OBJECTIVE:  VITAL SIGNS:  Blood pressure 100/69, respirations 20, pulse 73,  temperature 98.4.  LUNGS:  Clear.  HEART:  Regular rhythm.  ABDOMEN:  No palpable organs or masses.   ASSESSMENT:  See above.   PLAN:  Continue current regimen.      Angus G. Renard Matter, MD  Electronically Signed     AGM/MEDQ  D:  09/13/2005  T:  09/14/2005  Job:  [id number]

## 2011-01-10 ENCOUNTER — Other Ambulatory Visit: Payer: Self-pay | Admitting: Family Medicine

## 2011-01-12 ENCOUNTER — Telehealth: Payer: Self-pay | Admitting: Family Medicine

## 2011-01-12 NOTE — Telephone Encounter (Signed)
Med sent as requeseted

## 2011-01-13 ENCOUNTER — Encounter: Payer: Self-pay | Admitting: Family Medicine

## 2011-01-15 ENCOUNTER — Ambulatory Visit (INDEPENDENT_AMBULATORY_CARE_PROVIDER_SITE_OTHER): Payer: Commercial Managed Care - PPO | Admitting: Family Medicine

## 2011-01-15 ENCOUNTER — Encounter: Payer: Self-pay | Admitting: Family Medicine

## 2011-01-15 DIAGNOSIS — I10 Essential (primary) hypertension: Secondary | ICD-10-CM

## 2011-01-15 DIAGNOSIS — R5381 Other malaise: Secondary | ICD-10-CM

## 2011-01-15 DIAGNOSIS — R079 Chest pain, unspecified: Secondary | ICD-10-CM | POA: Insufficient documentation

## 2011-01-15 DIAGNOSIS — R5383 Other fatigue: Secondary | ICD-10-CM

## 2011-01-15 DIAGNOSIS — I498 Other specified cardiac arrhythmias: Secondary | ICD-10-CM

## 2011-01-15 DIAGNOSIS — E669 Obesity, unspecified: Secondary | ICD-10-CM

## 2011-01-15 DIAGNOSIS — F17200 Nicotine dependence, unspecified, uncomplicated: Secondary | ICD-10-CM | POA: Insufficient documentation

## 2011-01-15 DIAGNOSIS — R7309 Other abnormal glucose: Secondary | ICD-10-CM

## 2011-01-15 NOTE — Patient Instructions (Addendum)
cPE November 17 or after.  You are being referred to cardiology for further evaluation of chest pain and palpitations.   Labs today. 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    You will be referred to the nutrtionist for individual counselling    Please think about quitting smoking.  This is very important for your health.  Consider setting a quit date, then cutting back or switching brands to prepare to stop.  Also think of the money you will save every day by not smoking.  Quick Tips to Quit Smoking: Fix a date i.e. keep a date in mind from when you would not touch a tobacco product to smoke  Keep yourself busy and block your mind with work loads or reading books or watching movies in malls where smoking is not allowed  Vanish off the things which reminds you about smoking for example match box, or your favorite lighter, or the pipe you used for smoking, or your favorite jeans and shirt with which you used to enjoy smoking, or the club where you used to do smoking  Try to avoid certain people places and incidences where and with whom smoking is a common factor to add on  Praise yourself with some token gifts from the money you saved by stopping smoking  Anti Smoking teams are there to help you. Join their programs  Anti-smoking Gums are there in many medical shops. Try them to quit smoking   Side-effects of Smoking: Disease caused by smoking cigarettes are emphysema, bronchitis, heart failures  Premature death  Cancer is the major side effect of smoking  Heart attacks and strokes are the quick effects of smoking causing sudden death  Some smokers lives end up with limbs amputated  Breathing problem or fast breathing is another side effect of smoking  Due to more intakes of smokes, carbon mono-oxide goes into your brain and other muscles of the body which leads to swelling of the veins and blockage to the air passage to lungs  Carbon monoxide  blocks blood vessels which leads to blockage in the flow of blood to different major body organs like heart lungs and thus leads to attacks and deaths  During pregnancy smoking is very harmful and leads to premature birth of the infant, spontaneous abortions, low weight of the infant during birth  Fat depositions to narrow and blocked blood vessels causing heart attacks  In many cases cigarette smoking caused infertility in men   Mammogram due in November , pls schedule

## 2011-01-15 NOTE — Assessment & Plan Note (Signed)
Needs rept lab, with excessive weight gain and poor eating pt likely has reverted to diabetes, and will require meds. She is refered to diabetic ed individual classes

## 2011-01-15 NOTE — Assessment & Plan Note (Signed)
Multiple cardiac risk factors, both personal and also based on family history, will refer to cardiology

## 2011-01-15 NOTE — Assessment & Plan Note (Signed)
Reports increased use in recent times, counseled to quit

## 2011-01-15 NOTE — Assessment & Plan Note (Signed)
Recurrent episode reported in th past 2 weeks, will refer to cardiology esp as there is also a family h/o tachy rythmia requiring intervention

## 2011-01-15 NOTE — Assessment & Plan Note (Signed)
Deteriorated. Patient re-educated about  the importance of commitment to a  minimum of 150 minutes of exercise per week. The importance of healthy food choices with portion control discussed. Encouraged to start a food diary, count calories and to consider  joining a support group. Sample diet sheets offered. Goals set by the patient for the next several months.    

## 2011-01-15 NOTE — Progress Notes (Signed)
  Subjective:    Patient ID: [name], female    DOB: 10-04-1970, 40 y.o.   MRN: [id number]  HPI 2 week h/o 3 episodes of racing of the heart up to 150 with chest tightness, feeel anxious with this , but denies all symptoms of associated panic or uuncrontoled anxiety.Longest duration , other 2 were 15 mins each She has been under increased financial stress in the past several months but otherwise no new stresses She has in the past received IV med in the ED  Two doses, approx 3 years ago.  Motherhas CAD , dx in her 78's  Brother had rapid heart rate from teen years and eventually had surgical correction in his 95's. Pt is diabetic and has hTN and also smokes Review of Systems Denies recent fever or chills. Denies sinus pressure, nasal congestion, ear pain or sore throat. Denies chest congestion, productive cough or wheezing. Denies  paroxysmal nocturnal dyspnea, orthopnea and leg swelling Denies abdominal pain, nausea, vomiting,diarrhea or constipation.   Denies dysuria, frequency, hesitancy or incontinence. Denies joint pain, swelling and limitation in mobility. Denies headaches, seizure, numbness, or tingling. Denies depression, or insomnia. Denies skin break down or rash.        Objective:   Physical Exam Patient alert and oriented and in no Cardiopulmonary distress.  HEENT: No facial asymmetry, EOMI, no sinus tenderness, TM's clear, Oropharynx pink and moist.  Neck supple no adenopathy.  Chest: Clear to auscultation bilaterally.decreased air entry bilaterally  CVS: S1, S2 no murmurs, no S3.  ABD: Soft non tender. Bowel sounds normal.  Ext: No edema  MS: Adequate ROM spine, shoulders, hips and knees.  Skin: Intact, no ulcerations or rash noted.  Psych: Good eye contact, normal affect. Memory intact not anxious or depressed appearing.  CNS: CN 2-12 intact, power, tone and sensation normal throughout.        Assessment & Plan:

## 2011-01-16 LAB — CBC WITH DIFFERENTIAL/PLATELET
Eosinophils Absolute: 0.2 10*3/uL (ref 0.0–0.7)
Eosinophils Relative: 3 % (ref 0–5)
HCT: 41 % (ref 36.0–46.0)
Hemoglobin: 13.5 g/dL (ref 12.0–15.0)
Lymphs Abs: 3.6 10*3/uL (ref 0.7–4.0)
MCH: 27.4 pg (ref 26.0–34.0)
MCV: 83.3 fL (ref 78.0–100.0)
Monocytes Absolute: 0.9 10*3/uL (ref 0.1–1.0)
Monocytes Relative: 11 % (ref 3–12)
Neutrophils Relative %: 42 % — ABNORMAL LOW (ref 43–77)
RBC: 4.92 MIL/uL (ref 3.87–5.11)

## 2011-01-16 LAB — BASIC METABOLIC PANEL
CO2: 25 mEq/L (ref 19–32)
Chloride: 101 mEq/L (ref 96–112)
Creat: 0.76 mg/dL (ref 0.50–1.10)

## 2011-01-16 LAB — TSH: TSH: 1.169 u[IU]/mL (ref 0.350–4.500)

## 2011-01-16 MED ORDER — METFORMIN HCL 500 MG PO TABS: 500.0000 mg | ORAL_TABLET | Freq: Two times a day (BID) | ORAL | Status: DC

## 2011-01-16 NOTE — Progress Notes (Signed)
Addended by: Adella Hare B on: 01/16/2011 02:48 PM   Modules accepted: Orders

## 2011-01-19 ENCOUNTER — Other Ambulatory Visit: Payer: Self-pay | Admitting: *Deleted

## 2011-01-19 MED ORDER — FREESTYLE LANCETS MISC: Status: DC

## 2011-01-19 MED ORDER — GLUCOSE BLOOD VI STRP: ORAL_STRIP | Status: DC

## 2011-01-20 ENCOUNTER — Encounter: Payer: Self-pay | Admitting: Family Medicine

## 2011-01-23 ENCOUNTER — Encounter: Payer: Self-pay | Admitting: Cardiology

## 2011-01-23 ENCOUNTER — Ambulatory Visit (INDEPENDENT_AMBULATORY_CARE_PROVIDER_SITE_OTHER): Payer: Commercial Managed Care - PPO | Admitting: Cardiology

## 2011-01-23 VITALS — BP 123/82 | HR 86 | Ht 71.0 in | Wt 282.0 lb

## 2011-01-23 DIAGNOSIS — I498 Other specified cardiac arrhythmias: Secondary | ICD-10-CM

## 2011-01-23 DIAGNOSIS — I10 Essential (primary) hypertension: Secondary | ICD-10-CM

## 2011-01-23 DIAGNOSIS — F17200 Nicotine dependence, unspecified, uncomplicated: Secondary | ICD-10-CM

## 2011-01-23 DIAGNOSIS — E669 Obesity, unspecified: Secondary | ICD-10-CM

## 2011-01-23 MED ORDER — DILTIAZEM HCL 30 MG PO TABS: 30.0000 mg | ORAL_TABLET | ORAL | Status: DC | PRN

## 2011-01-23 NOTE — Assessment & Plan Note (Signed)
Discussed diet and weight loss. 

## 2011-01-23 NOTE — Patient Instructions (Signed)
**Note De-Identified Shaivi Rothschild Obfuscation** Your physician has recommended you make the following change in your medication: Start taking Cardizem 30 mg (may take 1 to 2 tablets every 6 hrs. as needed for palpitations)   Your physician recommends that you schedule a follow-up appointment in: 6 months

## 2011-01-23 NOTE — Progress Notes (Signed)
Clinical Summary [name] is a 40 y.o.female referred for cardiology consultation by Dr. Lodema Hong. She has a history of PSVT, noted below, and after a two-year hiatus, had 3 separate episodes back in July. During these episodes she felt rapid palpitations with anxiety, chest tightness, shortness of breath. The longest episode was 30 minutes, and she was able to make things subside with vagal maneuvers, did not require any ER visits. She had no dizziness or syncope.  Record review finds a previous history of PSVT, treated with adenosine and Cardizem during an emergency department visit back in 2010. ECG from that time shows a narrow complex tachycardia at 171 beats per minute, probable reentrant mechanism, although an atypical flutter is also a possibility. She states that her brother had a history of SVT that required what sounds like ablation procedure.  Previously she was prescribed Cardizem CD following her ER visit in 2010, although she states that she did not take the medication, and had no cardiology follow up subsequently.  She reports being under increased stress in July, and since then has had no further episodes. Today we discussed vagal maneuvers, potential mechanisms of PSVT, and avenues for treatment. Also discussed caffeine restriction, smoking cessation.   Allergies  Allergen Reactions  . Codeine   . Metoclopramide Hcl     Medication list reviewed.  Past Medical History  Diagnosis Date  . Essential hypertension, benign   . Supraventricular tachycardia   . Weight loss     Hopitalized 4 years ago after loosing 200lb on a supervised program , malnurished with severe protein deficiency   . Pneumonia 2007  . Obesity     Since childhood   . Migraine headache 2007  . Spinal disease   . Type 2 diabetes mellitus     Past Surgical History  Procedure Date  . Tubal ligation 1996    Family History  Problem Relation Age of Onset  . Hypertension Mother   . Diabetes Mother     . Cancer Mother     Uterine   . Heart failure Mother     CABG  . Cancer Father 63    Prostate   . Hypertension Father   . Bradycardia Father     Irregular heart beat   . Diabetes Brother   . Diabetes Brother   . Hypertension Brother   . Hypertension Brother     Social History [name] reports that she has been smoking Cigarettes.  She has a 3.5 pack-year smoking history. She has never used smokeless tobacco. [name] reports that she does not drink alcohol.  Review of Systems Otherwise negative except as outlined.  Physical Examination Filed Vitals:   01/23/11 1347  BP: 123/82  Pulse: 86  Morbidly obese woman in no acute distress. HEENT: Conjunctiva and lids normal, oropharynx with moist mucosa. Neck: Supple, no elevated JVP or carotid bruits, no thyromegaly. Lungs: Clear to auscultation, and unlabored. Cardiac: Regular rate and rhythm, soft systolic murmur at the base, no S3 gallop. Abdomen: Soft, nontender, bowel sounds present. Skin: Warm and dry. Musculoskeletal: No kyphosis. Extremities: No pitting edema, distal pulses full. Neuropsychiatric: Alert and oriented x3, affect appropriate.   ECG Copy of ECG from July 26 shows normal sinus rhythm, PR interval 188 ms, nonspecific T-wave changes, decreased R wave progression, QT interval approximally 430 ms.  Studies Echocardiogram 09/09/2005: Normal LVEF of 65% without wall motion abnormalities, normal left atrial size, no significant valvular lesions beyond trace mitral regurgitation and trace tricuspid regurgitation.  Small posterior pericardial effusion.    Problem List and Plan

## 2011-01-23 NOTE — Assessment & Plan Note (Signed)
Blood pressure is well-controlled today. 

## 2011-01-23 NOTE — Assessment & Plan Note (Signed)
Have discussed smoking cessation.

## 2011-01-23 NOTE — Assessment & Plan Note (Signed)
Based on her history, suspect recent symptoms of palpitations and chest discomfort reflect recurrent PSVT back in July. She has had no dizziness or syncope with these episodes, able to make them resolve with vagal maneuvers. We discussed dietary modifications, caffeine restriction, and will also provide Cardizem 30 mg p.r.n. If her symptoms progress, we may need to consider daily medical therapy, otherwise can refer her for electrophysiology consultation.

## 2011-03-30 ENCOUNTER — Other Ambulatory Visit: Payer: Self-pay

## 2011-03-30 MED ORDER — VALSARTAN-HYDROCHLOROTHIAZIDE 160-25 MG PO TABS: ORAL_TABLET | ORAL | Status: DC

## 2011-04-20 ENCOUNTER — Telehealth: Payer: Self-pay | Admitting: Family Medicine

## 2011-04-20 MED ORDER — VALSARTAN-HYDROCHLOROTHIAZIDE 160-25 MG PO TABS: ORAL_TABLET | ORAL | Status: DC

## 2011-04-20 NOTE — Telephone Encounter (Signed)
Sent in

## 2011-05-06 ENCOUNTER — Encounter: Payer: Self-pay | Admitting: Family Medicine

## 2011-05-11 ENCOUNTER — Encounter: Payer: Self-pay | Admitting: Family Medicine

## 2011-06-22 ENCOUNTER — Encounter: Payer: Self-pay | Admitting: Family Medicine

## 2011-06-24 ENCOUNTER — Encounter: Payer: Commercial Managed Care - PPO | Admitting: Family Medicine

## 2012-01-03 ENCOUNTER — Emergency Department (HOSPITAL_COMMUNITY): Payer: 59

## 2012-01-03 ENCOUNTER — Encounter (HOSPITAL_COMMUNITY): Payer: Self-pay

## 2012-01-03 ENCOUNTER — Emergency Department (HOSPITAL_COMMUNITY)
Admission: EM | Admit: 2012-01-03 | Discharge: 2012-01-03 | Disposition: A | Discharge status: Home / Self Care | Payer: 59 | Attending: Emergency Medicine | Admitting: Emergency Medicine

## 2012-01-03 DIAGNOSIS — G43909 Migraine, unspecified, not intractable, without status migrainosus: Secondary | ICD-10-CM | POA: Insufficient documentation

## 2012-01-03 DIAGNOSIS — E119 Type 2 diabetes mellitus without complications: Secondary | ICD-10-CM | POA: Insufficient documentation

## 2012-01-03 DIAGNOSIS — F17200 Nicotine dependence, unspecified, uncomplicated: Secondary | ICD-10-CM | POA: Insufficient documentation

## 2012-01-03 DIAGNOSIS — I10 Essential (primary) hypertension: Secondary | ICD-10-CM | POA: Insufficient documentation

## 2012-01-03 LAB — POCT I-STAT, CHEM 8
BUN: 9 mg/dL (ref 6–23)
Creatinine, Ser: 0.8 mg/dL (ref 0.50–1.10)
Potassium: 3.4 mEq/L — ABNORMAL LOW (ref 3.5–5.1)
Sodium: 140 mEq/L (ref 135–145)
TCO2: 23 mmol/L (ref 0–100)

## 2012-01-03 MED ORDER — DIPHENHYDRAMINE HCL 50 MG/ML IJ SOLN
12.5000 mg | Freq: Once | INTRAMUSCULAR | Status: AC
  Administered 2012-01-03: 12.5 mg via INTRAVENOUS
  Filled 2012-01-03: qty 1

## 2012-01-03 MED ORDER — ONDANSETRON HCL 4 MG/2ML IJ SOLN
4.0000 mg | Freq: Once | INTRAMUSCULAR | Status: AC
  Administered 2012-01-03: 4 mg via INTRAVENOUS
  Filled 2012-01-03: qty 2

## 2012-01-03 MED ORDER — PROCHLORPERAZINE EDISYLATE 5 MG/ML IJ SOLN: 10.0000 mg | Freq: Once | INTRAMUSCULAR | Status: DC

## 2012-01-03 MED ORDER — ISOMETHEPTENE-APAP-DICHLORAL 65-325-100 MG PO CAPS: 1.0000 | ORAL_CAPSULE | Freq: Four times a day (QID) | ORAL | Status: DC | PRN

## 2012-01-03 MED ORDER — HYDROMORPHONE HCL PF 1 MG/ML IJ SOLN
1.0000 mg | Freq: Once | INTRAMUSCULAR | Status: AC
  Administered 2012-01-03: 1 mg via INTRAVENOUS
  Filled 2012-01-03: qty 1

## 2012-01-03 NOTE — ED Notes (Signed)
Patient vomited, EDP made aware before IV removed.

## 2012-01-03 NOTE — ED Provider Notes (Signed)
History     CSN: [id number]  Arrival date & time 01/03/12  1345   First MD Initiated Contact with Patient 01/03/12 1403      Chief Complaint  Patient presents with  . Headache    (Consider location/radiation/quality/duration/timing/severity/associated sxs/prior treatment) Patient is a 41 y.o. female presenting with headaches. The history is provided by the patient.  Headache  This is a new (Pt had history of migraines, used to get a bad one once per year but has not had any in a while.) problem. The current episode started 6 to 12 hours ago. The problem occurs constantly. The problem has not changed since onset.The headache is associated with nothing. Pain location: top of her head. The quality of the pain is described as dull and throbbing. The pain is at a severity of 7/10. The pain is severe. The pain does not radiate. Associated symptoms include anorexia and nausea. Pertinent negatives include no fever, no chest pressure and no vomiting. She has tried acetaminophen for the symptoms. The treatment provided no relief.  She does feel like her right side is heavy today.  No history of stroke.  She did have an episode of leg numbness in the past and was checked for MS but that was negative.  Past Medical History  Diagnosis Date  . Essential hypertension, benign   . Supraventricular tachycardia   . Weight loss     Hopitalized 4 years ago after loosing 200lb on a supervised program , malnurished with severe protein deficiency   . Pneumonia 2007  . Obesity     Since childhood   . Migraine headache 2007  . Spinal disease   . Type 2 diabetes mellitus     Past Surgical History  Procedure Date  . Tubal ligation 1996    Family History  Problem Relation Age of Onset  . Hypertension Mother   . Diabetes Mother   . Cancer Mother     Uterine   . Heart failure Mother     CABG  . Cancer Father 86    Prostate   . Hypertension Father   . Bradycardia Father     Irregular heart beat     . Diabetes Brother   . Diabetes Brother   . Hypertension Brother   . Hypertension Brother     History  Substance Use Topics  . Smoking status: Current Everyday Smoker -- 0.5 packs/day for 7 years    Types: Cigarettes  . Smokeless tobacco: Never Used  . Alcohol Use: No    OB History    Grav Para Term Preterm Abortions TAB SAB Ect Mult Living                  Review of Systems  Constitutional: Negative for fever.  Gastrointestinal: Positive for nausea and anorexia. Negative for vomiting.  Neurological: Positive for headaches.    Allergies  Codeine and Metoclopramide hcl  Home Medications   Current Outpatient Rx  Name Route Sig Dispense Refill  . CLOTRIMAZOLE-BETAMETHASONE 1-0.05 % EX CREA Topical Apply topically 2 (two) times daily. Apply twice a day for 10 days to affected areas, then as needed     . DILTIAZEM HCL 30 MG PO TABS Oral Take 1 tablet (30 mg total) by mouth as needed. May take 1 to 2 tablets by mouth every 6 hrs as needed for palpitations  30 tablet 6  . GLUCOSE BLOOD VI STRP  Once daily testing 100 each 11  . FREESTYLE LANCETS MISC  Once daily testing 100 each 11  . METFORMIN HCL 500 MG PO TABS Oral Take 1 tablet (500 mg total) by mouth 2 (two) times daily with a meal. 60 tablet 3  . VALSARTAN-HYDROCHLOROTHIAZIDE 160-25 MG PO TABS  TAKE 1 TABLET BY MOUTH EVERY DAY 90 tablet 0    BP 190/87  Pulse 96  Temp 98.3 F (36.8 C)  Resp 18  Ht 5\' 11"  (1.803 m)  Wt 285 lb (129.275 kg)  BMI 39.75 kg/m2  SpO2 97%  LMP 01/01/2012  Physical Exam  Nursing note and vitals reviewed. Constitutional: She is oriented to person, place, and time. She appears well-developed and well-nourished. No distress.  HENT:  Head: Normocephalic and atraumatic.  Right Ear: External ear normal.  Left Ear: External ear normal.  Mouth/Throat: Oropharynx is clear and moist.  Eyes: Conjunctivae are normal. Right eye exhibits no discharge. Left eye exhibits no discharge. No scleral  icterus.  Neck: Neck supple. No tracheal deviation present.  Cardiovascular: Normal rate, regular rhythm and intact distal pulses.   Pulmonary/Chest: Effort normal and breath sounds normal. No stridor. No respiratory distress. She has no wheezes. She has no rales.  Abdominal: Soft. Bowel sounds are normal. She exhibits no distension. There is no tenderness. There is no rebound and no guarding.  Musculoskeletal: She exhibits no edema and no tenderness.  Neurological: She is alert and oriented to person, place, and time. She has normal strength. No cranial nerve deficit ( no gross defecits noted) or sensory deficit. She exhibits normal muscle tone. She displays no seizure activity. Coordination normal.       No pronator drift bilateral upper extrem, able to hold both legs off bed for 5 seconds, sensation intact in all extremities, no visual field cuts, no left or right sided neglect  Skin: Skin is warm and dry. No rash noted.  Psychiatric: She has a normal mood and affect.    ED Course  Procedures (including critical care time) EKG Normal sinus rhythm Possible Left atrial enlargement Incomplete right bundle branch block Prolonged QT Abnormal ECG When compared with ECG of 24-Apr-2009 12:57, Incomplete right bundle branch block is now Present Labs Reviewed  POCT I-STAT, CHEM 8 - Abnormal; Notable for the following:    Potassium 3.4 (*)     Glucose, Bld 106 (*)     All other components within normal limits   Ct Head Wo Contrast  01/03/2012  *RADIOLOGY REPORT*  Clinical Data:  Severe headache.  CT HEAD WITHOUT CONTRAST  Technique:  Contiguous axial images were obtained from the base of the skull through the vertex without contrast  Comparison:  MRI of brain dated 02/25/2010  Findings:  The brain has a normal appearance without evidence for hemorrhage, acute infarction, hydrocephalus, or mass lesion.  There is no extra axial fluid collection.  The skull and paranasal sinuses are normal.   IMPRESSION: Normal CT of the head without contrast.  Original Report Authenticated By: Reola Calkins, M.D.     MDM  Pt without focal neuro findings on exam.  Suspect her symptoms are related to a complex migraine.  Doubt TIA, stroke.  Symptoms are better after treatment in the ED.  Will dc home with pain medications.  Encouraged to return for worsening symptoms and follow up with her PCP if not improving, other concerns.        Celene Kras, MD 01/03/12 [phone]

## 2012-01-03 NOTE — ED Notes (Signed)
Pt reports woke up around 0730 with severe headache in top of head and c/o nausea.  Reports r side started feeling "heavy" approx 1 hour ago.  Reports history of migraine headaches but says hasn't had a migraine in years.

## 2012-04-18 ENCOUNTER — Encounter: Payer: Self-pay | Admitting: Family Medicine

## 2012-04-18 ENCOUNTER — Other Ambulatory Visit: Payer: Self-pay | Admitting: Family Medicine

## 2012-04-18 ENCOUNTER — Ambulatory Visit (INDEPENDENT_AMBULATORY_CARE_PROVIDER_SITE_OTHER): Payer: 59 | Admitting: Family Medicine

## 2012-04-18 ENCOUNTER — Other Ambulatory Visit (HOSPITAL_COMMUNITY)
Admission: RE | Admit: 2012-04-18 | Discharge: 2012-04-18 | Disposition: A | Discharge status: Home / Self Care | Payer: 59 | Source: Ambulatory Visit | Attending: Family Medicine | Admitting: Family Medicine

## 2012-04-18 VITALS — BP 142/78 | HR 98 | Resp 18 | Ht 71.5 in | Wt 300.0 lb

## 2012-04-18 DIAGNOSIS — Z01419 Encounter for gynecological examination (general) (routine) without abnormal findings: Secondary | ICD-10-CM | POA: Insufficient documentation

## 2012-04-18 DIAGNOSIS — Z13220 Encounter for screening for lipoid disorders: Secondary | ICD-10-CM

## 2012-04-18 DIAGNOSIS — Z1151 Encounter for screening for human papillomavirus (HPV): Secondary | ICD-10-CM | POA: Insufficient documentation

## 2012-04-18 DIAGNOSIS — R5383 Other fatigue: Secondary | ICD-10-CM

## 2012-04-18 DIAGNOSIS — J309 Allergic rhinitis, unspecified: Secondary | ICD-10-CM

## 2012-04-18 DIAGNOSIS — Z0000 Encounter for general adult medical examination without abnormal findings: Secondary | ICD-10-CM | POA: Insufficient documentation

## 2012-04-18 DIAGNOSIS — IMO0001 Reserved for inherently not codable concepts without codable children: Secondary | ICD-10-CM

## 2012-04-18 DIAGNOSIS — Z1211 Encounter for screening for malignant neoplasm of colon: Secondary | ICD-10-CM

## 2012-04-18 DIAGNOSIS — R5381 Other malaise: Secondary | ICD-10-CM

## 2012-04-18 DIAGNOSIS — E049 Nontoxic goiter, unspecified: Secondary | ICD-10-CM

## 2012-04-18 DIAGNOSIS — I10 Essential (primary) hypertension: Secondary | ICD-10-CM

## 2012-04-18 DIAGNOSIS — R7309 Other abnormal glucose: Secondary | ICD-10-CM

## 2012-04-18 DIAGNOSIS — F17200 Nicotine dependence, unspecified, uncomplicated: Secondary | ICD-10-CM

## 2012-04-18 MED ORDER — BUPROPION HCL ER (SR) 150 MG PO TB12: 150.0000 mg | ORAL_TABLET | Freq: Two times a day (BID) | ORAL | Status: DC

## 2012-04-18 MED ORDER — FLUTICASONE PROPIONATE 50 MCG/ACT NA SUSP: 2.0000 | Freq: Every day | NASAL | Status: DC | PRN

## 2012-04-18 MED ORDER — VALSARTAN-HYDROCHLOROTHIAZIDE 160-25 MG PO TABS: 1.0000 | ORAL_TABLET | Freq: Every day | ORAL | Status: DC

## 2012-04-18 NOTE — Progress Notes (Signed)
  Subjective:    Patient ID: [name], female    DOB: 1971/03/28, 41 y.o.   MRN: [id number]  HPI Pt in fo annual exam after not being in for over 12 months. Nicotine use is worse, eating habits have deteriorated with weight gain States she has ben under increased stress and has had poor control over unhealthy habits, wants to quit smoking and interested in  Stopped metformin say it caused diarreah   Review of Systems See HPI Denies recent fever or chills. Denies sinus pressure, nasal congestion, ear pain or sore throat. Denies chest congestion, productive cough or wheezing. Denies chest pains, palpitations and leg swelling Denies abdominal pain, nausea, vomiting,diarrhea or constipation.   Denies dysuria, frequency, hesitancy or incontinence. Denies joint pain, swelling and limitation in mobility. Denies headaches, seizures, numbness, or tingling. Denies skin break down or rash.        Objective:   Physical Exam Pleasant obese, alert and oriented x 3, in no cardio-pulmonary distress. Afebrile. HEENT No facial trauma or asymetry. Sinuses non tender.  EOMI, PERTL, fundoscopic exam is normal, no hemorhage or exudate.  External ears normal, tympanic membranes clear. Oropharynx moist, no exudate, good dentition. Neck: supple, no adenopathy,JVD or thyromegaly.No bruits.  Chest: Clear to ascultation bilaterally.No crackles or wheezes.decrease air entry throughout Non tender to palpation  Breast: No asymetry,no masses. No nipple discharge or inversion. No axillary or supraclavicular adenopathy  Cardiovascular system; Heart sounds normal,  S1 and  S2 ,no S3.  No murmur, or thrill. Apical beat not displaced Peripheral pulses normal.  Abdomen: Soft, non tender, no organomegaly or masses. No bruits. Bowel sounds normal. No guarding, tenderness or rebound.  Rectal:  No mass. Guaiac negative stool.  GU: External genitalia normal. No lesions. Vaginal canal  normal.No discharge. Uterus normal size, no adnexal masses, no cervical motion or adnexal tenderness.  Musculoskeletal exam: Full ROM of spine, hips , shoulders and knees. No deformity ,swelling or crepitus noted. No muscle wasting or atrophy.   Neurologic: Cranial nerves 2 to 12 intact. Power, tone ,sensation and reflexes normal throughout. No disturbance in gait. No tremor.  Skin: Intact, no ulceration, erythema , scaling or rash noted. Pigmentation normal throughout  Psych; Normal mood and affect. Judgement and concentration normal        Assessment & Plan:

## 2012-04-18 NOTE — Assessment & Plan Note (Signed)
Uncontrolled, no med change DASH diet and commitment to daily physical activity for a minimum of 30 minutes discussed and encouraged, as a part of hypertension management. The importance of attaining a healthy weight is also discussed.  

## 2012-04-18 NOTE — Assessment & Plan Note (Signed)
Controlled , meds refilled  

## 2012-04-18 NOTE — Assessment & Plan Note (Signed)
Deteriorated Patient counseled for approximately 5 minutes regarding the health risks of ongoing nicotine use, specifically all types of cancer, heart disease, stroke and respiratory failure. The options available for help with cessation ,the behavioral changes to assist the process, and the option to either gradully reduce usage  Or abruptly stop.is also discussed. Pt is also encouraged to set specific goals in number of cigarettes used daily, as well as to set a quit date.    

## 2012-04-18 NOTE — Assessment & Plan Note (Addendum)
General exam significant for morbid obesity with weight gain and recurrent nicotine use. counselled pt re need for lifestyle change to improve health and lose weight Mammogram past due pt to schedule

## 2012-04-18 NOTE — Assessment & Plan Note (Signed)
Patient educated about the importance of limiting  Carbohydrate intake , the need to commit to daily physical activity for a minimum of 30 minutes , and to commit weight loss. The fact that changes in all these areas will reduce or eliminate all together the development of diabetes is stressed.   Updated lab needed 

## 2012-04-18 NOTE — Patient Instructions (Addendum)
F/U in 3 month  It is important that you exercise regularly at least 30 minutes 5 times a week. If you develop chest pain, have severe difficulty breathing, or feel very tired, stop exercising immediately and seek medical attention    A healthy diet is rich in fruit, vegetables and whole grains. Poultry fish, nuts and beans are a healthy choice for protein rather then red meat. A low sodium diet and drinking 64 ounces of water daily is generally recommended. Oils and sweet should be limited. Carbohydrates especially for those who are diabetic or overweight, should be limited to 34-45 gram per meal. It is important to eat on a regular schedule, at least 3 times daily. Snacks should be primarily fruits, vegetables or nuts. Weight loss goal of 4 pounds per month  You need to set a quit date and stop smoking. You are prescribed wellbutrin to help with this, start with 1 tab daily for 1 week then increase to twice daily as prescribed.  Fasting CBC, chem 7 lipid, TSH and HBA1C THIS WEEK please  90 day supply of diovan and flonase with 1 refill will be sent to your pharmacy  Pls verify when you had your Tdap and let us know

## 2012-04-20 LAB — CBC
Hemoglobin: 13.4 g/dL (ref 12.0–15.0)
MCH: 26.9 pg (ref 26.0–34.0)
MCV: 77.8 fL — ABNORMAL LOW (ref 78.0–100.0)
RBC: 4.99 MIL/uL (ref 3.87–5.11)

## 2012-04-20 LAB — LIPID PANEL
Cholesterol: 157 mg/dL (ref 0–200)
Total CHOL/HDL Ratio: 4.2 Ratio
Triglycerides: 171 mg/dL — ABNORMAL HIGH (ref <150)
VLDL: 34 mg/dL (ref 0–40)

## 2012-04-20 LAB — COMPREHENSIVE METABOLIC PANEL
BUN: 10 mg/dL (ref 6–23)
CO2: 25 mEq/L (ref 19–32)
Creat: 0.79 mg/dL (ref 0.50–1.10)
Glucose, Bld: 110 mg/dL — ABNORMAL HIGH (ref 70–99)
Total Bilirubin: 0.4 mg/dL (ref 0.3–1.2)

## 2012-04-21 ENCOUNTER — Other Ambulatory Visit: Payer: Self-pay | Admitting: Family Medicine

## 2012-04-25 ENCOUNTER — Telehealth: Payer: Self-pay | Admitting: Family Medicine

## 2012-04-25 MED ORDER — SITAGLIPTIN PHOS-METFORMIN HCL 50-1000 MG PO TABS: 1.0000 | ORAL_TABLET | Freq: Two times a day (BID) | ORAL | Status: DC

## 2012-04-25 NOTE — Telephone Encounter (Signed)
Resent into pharmacy 

## 2012-04-26 ENCOUNTER — Other Ambulatory Visit: Payer: Self-pay

## 2012-04-26 ENCOUNTER — Other Ambulatory Visit: Payer: Self-pay | Admitting: Family Medicine

## 2012-04-26 MED ORDER — METRONIDAZOLE 500 MG PO TABS: ORAL_TABLET | ORAL | Status: DC

## 2012-04-26 NOTE — Progress Notes (Signed)
There was no message when I opened the phone message sent to me

## 2012-05-16 ENCOUNTER — Ambulatory Visit (HOSPITAL_COMMUNITY)
Admission: RE | Admit: 2012-05-16 | Discharge: 2012-05-16 | Disposition: A | Discharge status: Home / Self Care | Payer: 59 | Source: Ambulatory Visit | Attending: Family Medicine | Admitting: Family Medicine

## 2012-05-16 DIAGNOSIS — IMO0001 Reserved for inherently not codable concepts without codable children: Secondary | ICD-10-CM

## 2012-05-16 DIAGNOSIS — Z1231 Encounter for screening mammogram for malignant neoplasm of breast: Secondary | ICD-10-CM | POA: Insufficient documentation

## 2012-07-19 ENCOUNTER — Ambulatory Visit: Payer: 59 | Admitting: Family Medicine

## 2012-08-03 ENCOUNTER — Ambulatory Visit: Payer: 59 | Admitting: Family Medicine

## 2012-09-06 ENCOUNTER — Ambulatory Visit: Payer: 59 | Admitting: Family Medicine

## 2012-09-22 ENCOUNTER — Encounter: Payer: Self-pay | Admitting: Family Medicine

## 2012-09-22 ENCOUNTER — Ambulatory Visit (INDEPENDENT_AMBULATORY_CARE_PROVIDER_SITE_OTHER): Payer: 59 | Admitting: Family Medicine

## 2012-09-22 VITALS — BP 130/84 | HR 98 | Resp 18 | Wt 301.1 lb

## 2012-09-22 DIAGNOSIS — I10 Essential (primary) hypertension: Secondary | ICD-10-CM

## 2012-09-22 DIAGNOSIS — J309 Allergic rhinitis, unspecified: Secondary | ICD-10-CM

## 2012-09-22 DIAGNOSIS — E669 Obesity, unspecified: Secondary | ICD-10-CM

## 2012-09-22 DIAGNOSIS — E119 Type 2 diabetes mellitus without complications: Secondary | ICD-10-CM

## 2012-09-22 LAB — HEMOGLOBIN A1C
Hgb A1c MFr Bld: 7.8 % — ABNORMAL HIGH (ref <5.7)
Mean Plasma Glucose: 177 mg/dL — ABNORMAL HIGH (ref <117)

## 2012-09-22 MED ORDER — LOSARTAN POTASSIUM-HCTZ 100-25 MG PO TABS: 1.0000 | ORAL_TABLET | Freq: Every day | ORAL | Status: DC

## 2012-09-22 MED ORDER — FLUTICASONE PROPIONATE 50 MCG/ACT NA SUSP: 2.0000 | Freq: Every day | NASAL | Status: DC

## 2012-09-22 NOTE — Progress Notes (Signed)
  Subjective:    Patient ID: [name], female    DOB: Nov 03, 1970, 42 y.o.   MRN: [id number]  HPI The PT is here for follow up and re-evaluation of chronic medical conditions, medication management and review of any available recent lab and radiology data.  Preventive health is updated, specifically  Cancer screening and Immunization.   Questions or concerns regarding consultations or procedures which the PT has had in the interim are  addressed. The PT denies any adverse reactions to current medications since the last visit.  There are no new concerns.  There are no specific complaints       Review of Systems See HPI Denies recent fever or chills. Denies sinus pressure, nasal congestion, ear pain or sore throat. Denies chest congestion, productive cough or wheezing. Denies chest pains, palpitations and leg swelling Denies abdominal pain, nausea, vomiting,diarrhea or constipation.   Denies dysuria, frequency, hesitancy or incontinence. Denies joint pain, swelling and limitation in mobility. Denies headaches, seizures, numbness, or tingling. Denies depression, anxiety or insomnia. Denies skin break down or rash.        Objective:   Physical Exam  Patient alert and oriented and in no cardiopulmonary distress.  HEENT: No facial asymmetry, EOMI, no sinus tenderness,  oropharynx pink and moist.  Neck supple no adenopathy.  Chest: Clear to auscultation bilaterally.  CVS: S1, S2 no murmurs, no S3.  ABD: Soft non tender. Bowel sounds normal.  Ext: No edema  MS: Adequate ROM spine, shoulders, hips and knees.  Skin: Intact, no ulcerations or rash noted.  Psych: Good eye contact, normal affect. Memory intact not anxious or depressed appearing.  CNS: CN 2-12 intact, power, tone and sensation normal throughout.       Assessment & Plan:

## 2012-09-22 NOTE — Assessment & Plan Note (Signed)
Patient advised to reduce carb and sweets, commit to regular physical activity, take meds as prescribed, test blood as directed, and attempt to lose weight, to improve blood sugar control. Updated lab today 

## 2012-09-22 NOTE — Assessment & Plan Note (Signed)
Increased symptoms in Spring, flonase prescribed

## 2012-09-22 NOTE — Patient Instructions (Addendum)
F/u in 4 month  It is important that you exercise regularly at least 30 minutes 5 times a week. If you develop chest pain, have severe difficulty breathing, or feel very tired, stop exercising immediately and seek medical attention   Weight loss goal of 16 to 20 pounds.  Congrats on smoking cessation  HBA1C , chem 7 and EGFR todat  Fasting lipid, chem 7 and HBA1C in 4 month and EGFR  Change in blood pressure med due to formulary change.  Pneumonia vaccine and microalb today. You are also referred for eye exam  If blood sugar not at goal , less than 7 I will add additional drug [place]

## 2012-09-22 NOTE — Assessment & Plan Note (Signed)
Unchanged. Patient re-educated about  the importance of commitment to a  minimum of 150 minutes of exercise per week. The importance of healthy food choices with portion control discussed. Encouraged to start a food diary, count calories and to consider  joining a support group. Sample diet sheets offered. Goals set by the patient for the next several months.    

## 2012-09-22 NOTE — Assessment & Plan Note (Signed)
Controlled, however, due to formulary change med is being changed DASH diet and commitment to daily physical activity for a minimum of 30 minutes discussed and encouraged, as a part of hypertension management. The importance of attaining a healthy weight is also discussed.

## 2012-09-23 ENCOUNTER — Other Ambulatory Visit: Payer: Self-pay

## 2012-09-23 ENCOUNTER — Other Ambulatory Visit: Payer: Self-pay | Admitting: Family Medicine

## 2012-09-23 LAB — COMPLETE METABOLIC PANEL WITH GFR
ALT: 17 U/L (ref 0–35)
AST: 21 U/L (ref 0–37)
Albumin: 4.3 g/dL (ref 3.5–5.2)
BUN: 11 mg/dL (ref 6–23)
CO2: 26 mEq/L (ref 19–32)
Calcium: 9.6 mg/dL (ref 8.4–10.5)
Chloride: 98 mEq/L (ref 96–112)
Creat: 0.81 mg/dL (ref 0.50–1.10)
GFR, Est African American: >89 mL/min
Potassium: 4.1 mEq/L (ref 3.5–5.3)

## 2012-09-23 LAB — MICROALBUMIN / CREATININE URINE RATIO: Microalb, Ur: 0.5 mg/dL (ref 0.00–1.89)

## 2012-09-23 MED ORDER — DAPAGLIFLOZIN PROPANEDIOL 5 MG PO TABS: 1.0000 | ORAL_TABLET | Freq: Every day | ORAL | Status: DC

## 2012-12-29 ENCOUNTER — Other Ambulatory Visit: Payer: Self-pay

## 2013-01-24 ENCOUNTER — Ambulatory Visit: Payer: 59 | Admitting: Family Medicine

## 2013-04-27 ENCOUNTER — Other Ambulatory Visit: Payer: Self-pay

## 2013-07-24 ENCOUNTER — Ambulatory Visit (INDEPENDENT_AMBULATORY_CARE_PROVIDER_SITE_OTHER): Payer: 59 | Admitting: Cardiology

## 2013-07-24 ENCOUNTER — Encounter: Payer: Self-pay | Admitting: Cardiology

## 2013-07-24 VITALS — BP 132/73 | HR 82 | Ht 71.0 in | Wt 303.8 lb

## 2013-07-24 DIAGNOSIS — I471 Supraventricular tachycardia: Secondary | ICD-10-CM

## 2013-07-24 DIAGNOSIS — I10 Essential (primary) hypertension: Secondary | ICD-10-CM

## 2013-07-24 DIAGNOSIS — I498 Other specified cardiac arrhythmias: Secondary | ICD-10-CM

## 2013-07-24 MED ORDER — DILTIAZEM HCL ER COATED BEADS 120 MG PO CP24: 120.0000 mg | ORAL_CAPSULE | Freq: Every day | ORAL | Status: DC

## 2013-07-24 MED ORDER — DILTIAZEM HCL 30 MG PO TABS: ORAL_TABLET | ORAL | Status: DC

## 2013-07-24 MED ORDER — DILTIAZEM HCL 30 MG PO TABS
ORAL_TABLET | ORAL | Status: DC
Start: 2013-07-24 — End: 2013-07-24

## 2013-07-24 NOTE — Patient Instructions (Addendum)
Your physician recommends that you schedule a follow-up appointment in: 3 months  Your physician has recommended you make the following change in your medication:   START Cardizem CD 120 mg daily  USE Cardizem 30 mg  AS NEEDED for breakthrough palpitations   Thanks for choosing [facility] !

## 2013-07-24 NOTE — Progress Notes (Signed)
Clinical Summary [name] is a 43 y.o.female last seen in the office in August 2012. She presents describing recent breakthrough episodes of SVT. In the last year she had 3 episodes, but in the last week she has had an additional 3 episodes. Longest one lasted for several hours despite use of short acting Cardizem. She has been under stress with her job, but otherwise reports no major health changes, no increase in caffeine. She has had no chest pain or syncope with these events.  She has a previous history of PSVT, treated with adenosine and Cardizem during an emergency department visit back in 2010. ECG from that time shows a narrow complex tachycardia at 171 beats per minute, probable reentrant mechanism, although an atypical flutter is also a possibility. She states that her brother had a history of SVT that required what sounds like ablation procedure.  ECG today shows normal sinus rhythm, borderline low voltage in the precordial leads, leftward axis, decreased R wave progression.  Allergies  Allergen Reactions  . Codeine Nausea And Vomiting  . Metoclopramide Hcl Other (See Comments)    Not in right state of mind    Current Outpatient Prescriptions  Medication Sig Dispense Refill  . acetaminophen (TYLENOL) 500 MG tablet Take 1,000 mg by mouth every 6 (six) hours as needed. Pain      . Dapagliflozin Propanediol (FARXIGA) 5 MG TABS Take 1 tablet by mouth daily.  90 tablet  1  . fluticasone (FLONASE) 50 MCG/ACT nasal spray Place 2 sprays into the nose daily.  48 g  1  . losartan-hydrochlorothiazide (HYZAAR) 100-25 MG per tablet Take 1 tablet by mouth daily.  90 tablet  3  . sitaGLIPtan-metformin (JANUMET) 50-1000 MG per tablet Take 1 tablet by mouth 2 (two) times daily with a meal.  180 tablet  1  . diltiazem (CARDIZEM CD) 120 MG 24 hr capsule Take 1 capsule (120 mg total) by mouth daily.  90 capsule  3   No current facility-administered medications for this visit.    Past  Medical History  Diagnosis Date  . Essential hypertension, benign   . Supraventricular tachycardia   . Weight loss     Hopitalized 4 years ago after loosing 200lb on a supervised program , malnurished with severe protein deficiency   . Pneumonia 2007  . Obesity     Since childhood   . Migraine headache 2007  . Spinal disease   . Type 2 diabetes mellitus     Past Surgical History  Procedure Laterality Date  . Tubal ligation  1996    Review of Systems Negative except as outlined above.  Physical Examination Filed Vitals:   07/24/13 1616  BP: 132/73  Pulse: 82   Filed Weights   07/24/13 1616  Weight: 303 lb 12 oz (137.78 kg)    Obese woman in no acute distress.  HEENT: Conjunctiva and lids normal, oropharynx with moist mucosa.  Neck: Supple, no elevated JVP or carotid bruits, no thyromegaly.  Lungs: Clear to auscultation, and nonlabored.  Cardiac: Regular rate and rhythm, soft systolic murmur at the base, no S3 gallop.  Skin: Warm and dry.  Extremities: No pitting edema, distal pulses full.  Neuropsychiatric: Alert and oriented x3, affect appropriate.   Problem List and Plan   SUPRAVENTRICULAR TACHYCARDIA Previously documented history as outlined above. She has had more frequent symptoms in the last week over baseline. ECG reviewed. Plan is to initiate Cardizem CD 120 mg daily. She can still use  short-acting 30 mg doses as needed. May need to concurrently reduce her dose of Hyzaar depending on affect on blood pressure. If symptoms do not abate with medication adjustments, we have discussed referral to EP for consideration of ablation. For now she was most comfortable with medication trial.  Hypertension goal BP (blood pressure) < 130/80 I have asked her to keep an eye on her blood pressure. May need to consider reducing dose of Hyzaar once she starts on Cardizem CD.    Satira Sark, M.D., F.A.C.C.

## 2013-07-24 NOTE — Assessment & Plan Note (Signed)
I have asked her to keep an eye on her blood pressure. May need to consider reducing dose of Hyzaar once she starts on Cardizem CD.

## 2013-07-24 NOTE — Assessment & Plan Note (Signed)
Previously documented history as outlined above. She has had more frequent symptoms in the last week over baseline. ECG reviewed. Plan is to initiate Cardizem CD 120 mg daily. She can still use short-acting 30 mg doses as needed. May need to concurrently reduce her dose of Hyzaar depending on affect on blood pressure. If symptoms do not abate with medication adjustments, we have discussed referral to EP for consideration of ablation. For now she was most comfortable with medication trial.

## 2013-07-25 ENCOUNTER — Telehealth: Payer: Self-pay | Admitting: Family Medicine

## 2013-07-25 NOTE — Telephone Encounter (Signed)
Left message for patient to call back and schedule an appointment.

## 2013-07-25 NOTE — Telephone Encounter (Signed)
Pls try and contact pt , needs OV has not been in for over 1 year, I see where she was recently at the cardiologist , will need fasting labs for this visit, I will send a flag to Velna Hatchet re labs needed and ask her to fax the request to the lab

## 2013-08-02 ENCOUNTER — Telehealth: Payer: Self-pay

## 2013-08-02 DIAGNOSIS — Z79899 Other long term (current) drug therapy: Secondary | ICD-10-CM

## 2013-08-02 DIAGNOSIS — R5383 Other fatigue: Secondary | ICD-10-CM

## 2013-08-02 DIAGNOSIS — I10 Essential (primary) hypertension: Secondary | ICD-10-CM

## 2013-08-02 DIAGNOSIS — E669 Obesity, unspecified: Secondary | ICD-10-CM

## 2013-08-02 DIAGNOSIS — R5381 Other malaise: Secondary | ICD-10-CM

## 2013-08-02 DIAGNOSIS — E119 Type 2 diabetes mellitus without complications: Secondary | ICD-10-CM

## 2013-08-02 NOTE — Telephone Encounter (Signed)
Labs ordered and will be mailed.

## 2013-08-21 ENCOUNTER — Other Ambulatory Visit: Payer: Self-pay | Admitting: Family Medicine

## 2013-08-21 DIAGNOSIS — Z1231 Encounter for screening mammogram for malignant neoplasm of breast: Secondary | ICD-10-CM

## 2013-08-28 ENCOUNTER — Ambulatory Visit (HOSPITAL_COMMUNITY)
Admission: RE | Admit: 2013-08-28 | Discharge: 2013-08-28 | Disposition: A | Discharge status: Home / Self Care | Payer: 59 | Source: Ambulatory Visit | Attending: Family Medicine | Admitting: Family Medicine

## 2013-08-28 DIAGNOSIS — Z1231 Encounter for screening mammogram for malignant neoplasm of breast: Secondary | ICD-10-CM | POA: Insufficient documentation

## 2013-09-14 ENCOUNTER — Other Ambulatory Visit: Payer: Self-pay

## 2013-09-14 DIAGNOSIS — J309 Allergic rhinitis, unspecified: Secondary | ICD-10-CM

## 2013-09-14 MED ORDER — FLUTICASONE PROPIONATE 50 MCG/ACT NA SUSP: 2.0000 | Freq: Every day | NASAL | Status: DC

## 2013-10-24 LAB — CBC WITH DIFFERENTIAL/PLATELET
Basophils Absolute: 0 10*3/uL (ref 0.0–0.1)
Basophils Relative: 0 % (ref 0–1)
Eosinophils Absolute: 0.1 10*3/uL (ref 0.0–0.7)
Eosinophils Relative: 2 % (ref 0–5)
HCT: 37.5 % (ref 36.0–46.0)
Hemoglobin: 12.5 g/dL (ref 12.0–15.0)
Lymphocytes Relative: 57 % — ABNORMAL HIGH (ref 12–46)
Lymphs Abs: 4 10*3/uL (ref 0.7–4.0)
MCH: 26.9 pg (ref 26.0–34.0)
MCHC: 33.3 g/dL (ref 30.0–36.0)
MCV: 80.8 fL (ref 78.0–100.0)
Monocytes Absolute: 0.6 10*3/uL (ref 0.1–1.0)
Monocytes Relative: 8 % (ref 3–12)
NEUTROS PCT: 33 % — AB (ref 43–77)
Neutro Abs: 2.3 10*3/uL (ref 1.7–7.7)
PLATELETS: 321 10*3/uL (ref 150–400)
RBC: 4.64 MIL/uL (ref 3.87–5.11)
RDW: 14.8 % (ref 11.5–15.5)
WBC: 7.1 10*3/uL (ref 4.0–10.5)

## 2013-10-24 LAB — COMPLETE METABOLIC PANEL WITH GFR
ALBUMIN: 3.7 g/dL (ref 3.5–5.2)
ALT: 21 U/L (ref 0–35)
AST: 28 U/L (ref 0–37)
Alkaline Phosphatase: 61 U/L (ref 39–117)
BUN: 9 mg/dL (ref 6–23)
CO2: 25 mEq/L (ref 19–32)
Calcium: 8.7 mg/dL (ref 8.4–10.5)
Chloride: 104 mEq/L (ref 96–112)
Creat: 0.74 mg/dL (ref 0.50–1.10)
GFR, Est African American: >89 mL/min
GFR, Est Non African American: >89 mL/min
Glucose, Bld: 148 mg/dL — ABNORMAL HIGH (ref 70–99)
POTASSIUM: 3.9 meq/L (ref 3.5–5.3)
SODIUM: 139 meq/L (ref 135–145)
Total Bilirubin: 0.2 mg/dL (ref 0.2–1.2)
Total Protein: 6.6 g/dL (ref 6.0–8.3)

## 2013-10-24 LAB — LIPID PANEL
Cholesterol: 137 mg/dL (ref 0–200)
HDL: 32 mg/dL — AB (ref >39)
LDL CALC: 74 mg/dL (ref 0–99)
TRIGLYCERIDES: 154 mg/dL — AB (ref <150)
Total CHOL/HDL Ratio: 4.3 Ratio
VLDL: 31 mg/dL (ref 0–40)

## 2013-10-24 LAB — HEMOGLOBIN A1C
Hgb A1c MFr Bld: 8.7 % — ABNORMAL HIGH (ref <5.7)
Mean Plasma Glucose: 203 mg/dL — ABNORMAL HIGH (ref <117)

## 2013-10-24 LAB — TSH: TSH: 3.916 u[IU]/mL (ref 0.350–4.500)

## 2013-10-25 ENCOUNTER — Ambulatory Visit (INDEPENDENT_AMBULATORY_CARE_PROVIDER_SITE_OTHER): Payer: 59 | Admitting: Family Medicine

## 2013-10-25 ENCOUNTER — Encounter: Payer: Self-pay | Admitting: Family Medicine

## 2013-10-25 ENCOUNTER — Other Ambulatory Visit (HOSPITAL_COMMUNITY)
Admission: RE | Admit: 2013-10-25 | Discharge: 2013-10-25 | Disposition: A | Discharge status: Home / Self Care | Payer: 59 | Source: Ambulatory Visit | Attending: Family Medicine | Admitting: Family Medicine

## 2013-10-25 VITALS — BP 132/84 | HR 95 | Resp 18 | Ht 71.5 in | Wt 298.0 lb

## 2013-10-25 DIAGNOSIS — Z0000 Encounter for general adult medical examination without abnormal findings: Secondary | ICD-10-CM

## 2013-10-25 DIAGNOSIS — E1165 Type 2 diabetes mellitus with hyperglycemia: Secondary | ICD-10-CM

## 2013-10-25 DIAGNOSIS — Z124 Encounter for screening for malignant neoplasm of cervix: Secondary | ICD-10-CM

## 2013-10-25 DIAGNOSIS — Z1211 Encounter for screening for malignant neoplasm of colon: Secondary | ICD-10-CM

## 2013-10-25 DIAGNOSIS — I10 Essential (primary) hypertension: Secondary | ICD-10-CM

## 2013-10-25 DIAGNOSIS — Z01419 Encounter for gynecological examination (general) (routine) without abnormal findings: Secondary | ICD-10-CM | POA: Insufficient documentation

## 2013-10-25 DIAGNOSIS — E118 Type 2 diabetes mellitus with unspecified complications: Secondary | ICD-10-CM

## 2013-10-25 DIAGNOSIS — IMO0002 Reserved for concepts with insufficient information to code with codable children: Secondary | ICD-10-CM

## 2013-10-25 DIAGNOSIS — E119 Type 2 diabetes mellitus without complications: Secondary | ICD-10-CM

## 2013-10-25 LAB — POC HEMOCCULT BLD/STL (OFFICE/1-CARD/DIAGNOSTIC): Fecal Occult Blood, POC: NEGATIVE

## 2013-10-25 MED ORDER — GLIPIZIDE ER 10 MG PO TB24: 10.0000 mg | ORAL_TABLET | Freq: Every day | ORAL | Status: DC

## 2013-10-25 MED ORDER — ROSUVASTATIN CALCIUM 5 MG PO TABS: 5.0000 mg | ORAL_TABLET | Freq: Every day | ORAL | Status: DC

## 2013-10-25 NOTE — Patient Instructions (Addendum)
F/u in 3 month, call if you need me before  Blood sugar is too high, it is out of control.You need to test daily, take med as prescribed, call with problems, and follow the diet and exercise plan as best as you are able Goal for fasting blood sugar ranges from 80 to 120 and 2 hours after any meal or at bedtime should be between 130 to 170.  New medication for lipids to help reduce heart disease risk  Fasting lipid, cmp and EGFr and HBA1C in 3 month

## 2013-10-26 LAB — MICROALBUMIN / CREATININE URINE RATIO
Creatinine, Urine: 56.9 mg/dL
Microalb, Ur: <0.5 mg/dL (ref 0.00–1.89)

## 2013-10-26 NOTE — Assessment & Plan Note (Signed)
Annual exam as documented. Counseling done  re healthy lifestyle involving commitment to 150 minutes exercise per week, heart healthy diet, and attaining healthy weight.The importance of adequate sleep also discussed. Regular seat belt use and safe storage  of firearms if patient has them, is also discussed. Changes in health habits are decided on by the patient with goals and time frames  set for achieving them. Immunization and cancer screening needs are specifically addressed at this visit.  

## 2013-10-26 NOTE — Progress Notes (Signed)
Subjective:    Patient ID: [name], female    DOB: 13-Nov-1970, 43 y.o.   MRN: [id number]  HPI Patient is in for annual exam Health maintainance is reviewed and updated, specifically screening tests and recommended immunizations. Recent lab and radiologic data, since previous visit is also reviewed with the patient. Chronic medical conditions are also reviewed, pt has not been in for 1 year and her diabetes is uncontrolled, states she does not know how to test   Has been having recurrent SVT in recent times and may need ablation, her brother needed ablation at a young age also    Review of Systems See HPI Denies recent fever or chills. Denies sinus pressure, nasal congestion, ear pain or sore throat. Denies chest congestion, productive cough or wheezing.  Denies abdominal pain, nausea, vomiting,diarrhea or constipation.   Denies dysuria, frequency, hesitancy or incontinence. Denies joint pain, swelling and limitation in mobility. Denies headaches, seizures, numbness, or tingling. Denies depression, anxiety or insomnia. Denies skin break down or rash.        Objective:   Physical Exam BP 132/84  Pulse 95  Resp 18  Ht 5' 11.5" (1.816 m)  Wt 298 lb 0.6 oz (135.19 kg)  BMI 40.99 kg/m2  SpO2 95% Pleasant obese female, alert and oriented x 3, in no cardio-pulmonary distress. Afebrile. HEENT No facial trauma or asymetry. Sinuses non tender.  EOMI, PERTL, fundoscopic exam is normal, no hemorhage or exudate.  External ears normal, tympanic membranes clear. Oropharynx moist, no exudate, good dentition. Neck: supple, no adenopathy,JVD or thyromegaly.No bruits.  Chest: Clear to ascultation bilaterally.No crackles or wheezes. Non tender to palpation  Breast: No asymetry,no masses. No nipple discharge or inversion. No axillary or supraclavicular adenopathy  Cardiovascular system; Heart sounds normal,  S1 and  S2 ,no S3.  No murmur, or thrill. Apical beat not  displaced Peripheral pulses normal.  Abdomen: Soft, non tender, no organomegaly or masses. No bruits. Bowel sounds normal. No guarding, tenderness or rebound.  Rectal:  No mass. Guaiac negative stool.  GU: External genitalia normal. No lesions. Vaginal canal normal.No discharge. Uterus normal size, no adnexal masses, no cervical motion or adnexal tenderness.  Musculoskeletal exam: Full ROM of spine, hips , shoulders and knees. No deformity ,swelling or crepitus noted. No muscle wasting or atrophy.   Neurologic: Cranial nerves 2 to 12 intact. Power, tone ,sensation and reflexes normal throughout. No disturbance in gait. No tremor.  Skin: Intact, no ulceration, erythema , scaling or rash noted. Pigmentation normal throughout  Psych; Normal mood and affect. Judgement and concentration normal        Assessment & Plan:  Routine general medical examination at a health care facility Annual exam as documented. Counseling done  re healthy lifestyle involving commitment to 150 minutes exercise per week, heart healthy diet, and attaining healthy weight.The importance of adequate sleep also discussed. Regular seat belt use and safe storage  of firearms if patient has them, is also discussed. Changes in health habits are decided on by the patient with goals and time frames  set for achieving them. Immunization and cancer screening needs are specifically addressed at this visit.   Diabetes mellitus type 2, uncontrolled, with complications Non compliant with medication , not  Tolerant of prescribed meds, not testing. Re educated, start glipizide call oin 4 weeks if still uncontrolled , f/u in 3 month Add statin Commit to daily exercise and change in diet to improve blood sugar and promote weight loss

## 2013-10-26 NOTE — Assessment & Plan Note (Signed)
Non compliant with medication , not  Tolerant of prescribed meds, not testing. Re educated, start glipizide call oin 4 weeks if still uncontrolled , f/u in 3 month Add statin Commit to daily exercise and change in diet to improve blood sugar and promote weight loss

## 2013-11-02 ENCOUNTER — Encounter: Payer: Self-pay | Admitting: Family Medicine

## 2013-11-02 ENCOUNTER — Telehealth: Payer: Self-pay | Admitting: Family Medicine

## 2013-11-02 MED ORDER — GLIPIZIDE ER 5 MG PO TB24: 5.0000 mg | ORAL_TABLET | Freq: Every day | ORAL | Status: DC

## 2013-11-02 NOTE — Telephone Encounter (Signed)
Spoke with patient through [name]. She understands Dr Camillia Herter directions that were sent to her

## 2013-11-02 NOTE — Telephone Encounter (Signed)
Called patient and left message for them to return call at the office   

## 2013-11-02 NOTE — Telephone Encounter (Signed)
She was in a meeting when I called and left a message for her to call back. Per Mychart message he understands directions and wanted to know where the med was sent. I told her [facility] and to call if she has any questions. She said she understands.

## 2013-11-02 NOTE — Telephone Encounter (Signed)
Please go over pt message response directly with pt since there is a CHANGE in med dose, ensure she undwerstands. I have sent in the extra glipizide 5mg  tab let her know, please

## 2013-11-03 ENCOUNTER — Ambulatory Visit (INDEPENDENT_AMBULATORY_CARE_PROVIDER_SITE_OTHER): Payer: 59 | Admitting: Cardiology

## 2013-11-03 ENCOUNTER — Encounter: Payer: Self-pay | Admitting: Cardiology

## 2013-11-03 VITALS — BP 108/64 | HR 88 | Ht 71.0 in | Wt 292.0 lb

## 2013-11-03 DIAGNOSIS — I10 Essential (primary) hypertension: Secondary | ICD-10-CM

## 2013-11-03 DIAGNOSIS — I471 Supraventricular tachycardia, unspecified: Secondary | ICD-10-CM

## 2013-11-03 DIAGNOSIS — I498 Other specified cardiac arrhythmias: Secondary | ICD-10-CM

## 2013-11-03 NOTE — Progress Notes (Signed)
Clinical Summary [name] is a 43 y.o.female last seen in February of this year. At that time we added calcium channel blocker therapy due to recurring palpitations with her history of PSVT. She reports 3 additional episodes, essentially once a month, and requiring additional short acting Cardizem for symptom control. She is interested in EP consultation to discuss the possibility of an ablation.  She has a previous history of PSVT, treated with adenosine and Cardizem during an emergency department visit back in 2010. ECG from that time shows a narrow complex tachycardia at 171 beats per minute, probable reentrant mechanism, although an atypical flutter is also a possibility. She states that her brother had a history of SVT that required what sounds like ablation procedure, and he has done well since then.  Recent lab work showed cholesterol 137, triglycerides 154, HDL 32, LDL 74.   Allergies  Allergen Reactions  . Codeine Nausea And Vomiting  . Metoclopramide Hcl Other (See Comments)    Not in right state of mind    Current Outpatient Prescriptions  Medication Sig Dispense Refill  . acetaminophen (TYLENOL) 500 MG tablet Take 1,000 mg by mouth every 6 (six) hours as needed. Pain      . diltiazem (CARDIZEM CD) 120 MG 24 hr capsule Take 1 capsule (120 mg total) by mouth daily.  90 capsule  3  . diltiazem (CARDIZEM) 30 MG tablet Use cardizem 30 mg as needed for breakthrough palpitations  90 tablet  3  . fluticasone (FLONASE) 50 MCG/ACT nasal spray Place 2 sprays into both nostrils daily.  48 g  1  . glipiZIDE (GLUCOTROL XL) 10 MG 24 hr tablet Take 1 tablet (10 mg total) by mouth daily with breakfast.  90 tablet  1  . glipiZIDE (GLUCOTROL XL) 5 MG 24 hr tablet Take 1 tablet (5 mg total) by mouth daily with breakfast.  90 tablet  3  . rosuvastatin (CRESTOR) 5 MG tablet Take 1 tablet (5 mg total) by mouth at bedtime.  90 tablet  1  . losartan-hydrochlorothiazide (HYZAAR) 100-25 MG per  tablet Take 1 tablet by mouth daily.  90 tablet  3   No current facility-administered medications for this visit.    Past Medical History  Diagnosis Date  . Essential hypertension, benign   . Supraventricular tachycardia   . Weight loss     Hopitalized 4 years ago after loosing 200lb on a supervised program , malnurished with severe protein deficiency   . Pneumonia 2007  . Obesity     Since childhood   . Migraine headache 2007  . Spinal disease   . Type 2 diabetes mellitus     Social History [name] reports that she quit smoking about 14 months ago. Her smoking use included Cigarettes. She has a 3.5 pack-year smoking history. She has never used smokeless tobacco. [name] reports that she does not drink alcohol.  Review of Systems Negative except as outlined.  Physical Examination Filed Vitals:   11/03/13 1622  BP: 108/64  Pulse: 88   Filed Weights   11/03/13 1622  Weight: 292 lb (132.45 kg)    Obese woman in no acute distress.  HEENT: Conjunctiva and lids normal, oropharynx with moist mucosa.  Neck: Supple, no elevated JVP or carotid bruits, no thyromegaly.  Lungs: Clear to auscultation, and nonlabored.  Cardiac: Regular rate and rhythm, soft systolic murmur at the base, no S3 gallop.  Skin: Warm and dry.  Extremities: No pitting edema, distal pulses  full.  Neuropsychiatric: Alert and oriented x3, affect appropriate.   Problem List and Plan   SUPRAVENTRICULAR TACHYCARDIA Plan to continue calcium channel blocker therapy and refer her for EP consultation regarding possibility of radiofrequency ablation.  Essential hypertension, benign Blood pressure is well-controlled today.    Satira Sark, M.D., F.A.C.C.

## 2013-11-03 NOTE — Patient Instructions (Addendum)
Your physician recommends that you schedule a follow-up appointment in: 3 months    Your physician recommends that you continue on your current medications as directed. Please refer to the Current Medication list given to you today.    We will schedule you an appointment with our EP physicians      Thank you for choosing [facility] !

## 2013-11-03 NOTE — Assessment & Plan Note (Signed)
Blood pressure is well-controlled today. 

## 2013-11-03 NOTE — Assessment & Plan Note (Signed)
Plan to continue calcium channel blocker therapy and refer her for EP consultation regarding possibility of radiofrequency ablation.

## 2013-11-23 ENCOUNTER — Ambulatory Visit (INDEPENDENT_AMBULATORY_CARE_PROVIDER_SITE_OTHER): Payer: 59 | Admitting: Internal Medicine

## 2013-11-23 ENCOUNTER — Encounter: Payer: Self-pay | Admitting: Internal Medicine

## 2013-11-23 ENCOUNTER — Encounter: Payer: Self-pay | Admitting: *Deleted

## 2013-11-23 VITALS — BP 125/77 | HR 83 | Ht 71.0 in | Wt 288.0 lb

## 2013-11-23 DIAGNOSIS — I471 Supraventricular tachycardia: Secondary | ICD-10-CM

## 2013-11-23 DIAGNOSIS — I498 Other specified cardiac arrhythmias: Secondary | ICD-10-CM

## 2013-11-23 NOTE — Progress Notes (Signed)
HPI [name] is referred today by Dr. Domenic Polite for evaluation of SVT. She is a pleasant 43 yo woman with a h/o tachypalpitations and HTN. Her history dates back 5 years when she presented with SVT at 170/min. The patient describes being treated with IV adenosine with no termination of her rhythm and the IV cardizem with resolution of her symptoms. Her longest episode of palpitations occurred for almost 8 hours. She takes po cardizem. Her symptoms start and stop suddenly, and are associated with sob, near syncope and chest pressure. Allergies  Allergen Reactions  . Codeine Nausea And Vomiting  . Metoclopramide Hcl Other (See Comments)    Not in right state of mind     Current Outpatient Prescriptions  Medication Sig Dispense Refill  . acetaminophen (TYLENOL) 500 MG tablet Take 1,000 mg by mouth every 6 (six) hours as needed. Pain      . diltiazem (CARDIZEM CD) 120 MG 24 hr capsule Take 1 capsule (120 mg total) by mouth daily.  90 capsule  3  . diltiazem (CARDIZEM) 30 MG tablet Use cardizem 30 mg as needed for breakthrough palpitations  90 tablet  3  . fluticasone (FLONASE) 50 MCG/ACT nasal spray Place 2 sprays into both nostrils daily.  48 g  1  . glipiZIDE (GLUCOTROL XL) 10 MG 24 hr tablet Take 1 tablet (10 mg total) by mouth daily with breakfast.  90 tablet  1  . glipiZIDE (GLUCOTROL XL) 5 MG 24 hr tablet Take 1 tablet (5 mg total) by mouth daily with breakfast.  90 tablet  3  . rosuvastatin (CRESTOR) 5 MG tablet Take 1 tablet (5 mg total) by mouth at bedtime.  90 tablet  1  . losartan-hydrochlorothiazide (HYZAAR) 100-25 MG per tablet Take 1 tablet by mouth daily.  90 tablet  3   No current facility-administered medications for this visit.     Past Medical History  Diagnosis Date  . Essential hypertension, benign   . Supraventricular tachycardia   . Weight loss     Hopitalized 4 years ago after loosing 200lb on a supervised program , malnurished with severe protein  deficiency   . Pneumonia 2007  . Obesity     Since childhood   . Migraine headache 2007  . Spinal disease   . Type 2 diabetes mellitus     ROS:   All systems reviewed and negative except as noted in the HPI.   Past Surgical History  Procedure Laterality Date  . Tubal ligation  1996     Family History  Problem Relation Age of Onset  . Hypertension Mother   . Diabetes Mother   . Cancer Mother     Uterine   . Heart failure Mother     CABG  . Cancer Father 65    Prostate   . Hypertension Father   . Bradycardia Father     Irregular heart beat   . Diabetes Brother   . Diabetes Brother   . Hypertension Brother   . Hypertension Brother      History   Social History  . Marital Status: Single    Spouse Name: N/A    Number of Children: 2  . Years of Education: N/A   Occupational History  . 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 Smoking status: Former Smoker -- 0.50 packs/day for 7 years    Types: Cigarettes    Quit date: 08/05/2012  . Smokeless  tobacco: Never Used  . Alcohol Use: No  . Drug Use: No  . Sexual Activity: Not on file   Other Topics Concern  . Not on file   Social History Narrative  . No narrative on file     BP 125/77  Pulse 83  Ht 5\' 11"  (1.803 m)  Wt 288 lb (130.636 kg)  BMI 40.19 kg/m2  Physical Exam:  Obese appearing NAD HEENT: Unremarkable Neck:  No JVD, no thyromegally Back:  No CVA tenderness Lungs:  Clear with no wheezes, rales or rhonchi. HEART:  Regular rate rhythm, no murmurs, no rubs, no clicks Abd:  soft, positive bowel sounds, no organomegally, no rebound, no guarding Ext:  2 plus pulses, no edema, no cyanosis, no clubbing Skin:  No rashes no nodules Neuro:  CN II through XII intact, motor grossly intact  EKG - nsr with no pre-excitation   Assess/Plan:

## 2013-11-23 NOTE — Assessment & Plan Note (Signed)
We need to try and obtain a 12 lead ECG of her SVT. I suspect that she has atrial tachycardia based on her response to adenosine. I discussed the treatment options including the risks/benefits of catheter ablation as well as anti-arrhythmic therapy with flecainide. She is strongly considering catheter ablation. If so, we will want CARTO.

## 2013-11-23 NOTE — Patient Instructions (Addendum)
Your physician has recommended that you have an ablation. Catheter ablation is a medical procedure used to treat some cardiac arrhythmias (irregular heartbeats). During catheter ablation, a long, thin, flexible tube is put into a blood vessel in your groin (upper thigh), or neck. This tube is called an ablation catheter. It is then guided to your heart through the blood vessel. Radio frequency waves destroy small areas of heart tissue where abnormal heartbeats may cause an arrhythmia to start. Please see the instruction sheet given to you today---12/28/13---will need to be at the hospital at 5:30am  Please come to our office on 12/21/13 anytime for labs---do not have to be fasting

## 2013-12-20 ENCOUNTER — Encounter: Payer: Self-pay | Admitting: Family Medicine

## 2013-12-20 ENCOUNTER — Other Ambulatory Visit: Payer: Self-pay | Admitting: Family Medicine

## 2013-12-20 ENCOUNTER — Other Ambulatory Visit: Payer: Self-pay

## 2013-12-20 DIAGNOSIS — I10 Essential (primary) hypertension: Secondary | ICD-10-CM

## 2013-12-20 MED ORDER — LOSARTAN POTASSIUM-HCTZ 100-25 MG PO TABS: 1.0000 | ORAL_TABLET | Freq: Every day | ORAL | Status: DC

## 2013-12-21 ENCOUNTER — Other Ambulatory Visit (INDEPENDENT_AMBULATORY_CARE_PROVIDER_SITE_OTHER): Payer: 59

## 2013-12-21 DIAGNOSIS — I471 Supraventricular tachycardia: Secondary | ICD-10-CM

## 2013-12-21 DIAGNOSIS — I498 Other specified cardiac arrhythmias: Secondary | ICD-10-CM

## 2013-12-21 LAB — CBC WITH DIFFERENTIAL/PLATELET
BASOS ABS: 0 10*3/uL (ref 0.0–0.1)
BASOS PCT: 0.3 % (ref 0.0–3.0)
Eosinophils Absolute: 0.1 10*3/uL (ref 0.0–0.7)
Eosinophils Relative: 1.4 % (ref 0.0–5.0)
HEMATOCRIT: 37.9 % (ref 36.0–46.0)
HEMOGLOBIN: 12.7 g/dL (ref 12.0–15.0)
LYMPHS ABS: 3.7 10*3/uL (ref 0.7–4.0)
LYMPHS PCT: 40.9 % (ref 12.0–46.0)
MCHC: 33.4 g/dL (ref 30.0–36.0)
MCV: 82.5 fl (ref 78.0–100.0)
Monocytes Absolute: 0.7 10*3/uL (ref 0.1–1.0)
Monocytes Relative: 8.2 % (ref 3.0–12.0)
Neutro Abs: 4.4 10*3/uL (ref 1.4–7.7)
Neutrophils Relative %: 49.2 % (ref 43.0–77.0)
Platelets: 336 10*3/uL (ref 150.0–400.0)
RBC: 4.6 Mil/uL (ref 3.87–5.11)
RDW: 14.4 % (ref 11.5–15.5)
WBC: 8.9 10*3/uL (ref 4.0–10.5)

## 2013-12-21 LAB — BASIC METABOLIC PANEL
BUN: 14 mg/dL (ref 6–23)
CO2: 28 meq/L (ref 19–32)
Calcium: 9.2 mg/dL (ref 8.4–10.5)
Chloride: 103 mEq/L (ref 96–112)
Creatinine, Ser: 0.9 mg/dL (ref 0.4–1.2)
GFR: 88.65 mL/min (ref >60.00)
GLUCOSE: 174 mg/dL — AB (ref 70–99)
Potassium: 3.1 mEq/L — ABNORMAL LOW (ref 3.5–5.1)
SODIUM: 136 meq/L (ref 135–145)

## 2013-12-25 ENCOUNTER — Encounter (HOSPITAL_COMMUNITY): Payer: Self-pay | Admitting: Pharmacy Technician

## 2013-12-28 ENCOUNTER — Encounter (HOSPITAL_COMMUNITY): Payer: Self-pay | Admitting: Anesthesiology

## 2013-12-28 ENCOUNTER — Ambulatory Visit (HOSPITAL_COMMUNITY)
Admission: RE | Admit: 2013-12-28 | Discharge: 2013-12-29 | Disposition: A | Discharge status: Home / Self Care | Payer: 59 | Source: Ambulatory Visit | Attending: Internal Medicine | Admitting: Internal Medicine

## 2013-12-28 ENCOUNTER — Encounter (HOSPITAL_COMMUNITY)
Admission: RE | Disposition: A | Discharge status: Home / Self Care | Payer: Self-pay | Source: Ambulatory Visit | Attending: Internal Medicine

## 2013-12-28 DIAGNOSIS — E1165 Type 2 diabetes mellitus with hyperglycemia: Secondary | ICD-10-CM

## 2013-12-28 DIAGNOSIS — Z6841 Body Mass Index (BMI) 40.0 and over, adult: Secondary | ICD-10-CM | POA: Insufficient documentation

## 2013-12-28 DIAGNOSIS — E669 Obesity, unspecified: Secondary | ICD-10-CM | POA: Insufficient documentation

## 2013-12-28 DIAGNOSIS — Z87891 Personal history of nicotine dependence: Secondary | ICD-10-CM | POA: Insufficient documentation

## 2013-12-28 DIAGNOSIS — E118 Type 2 diabetes mellitus with unspecified complications: Secondary | ICD-10-CM

## 2013-12-28 DIAGNOSIS — E119 Type 2 diabetes mellitus without complications: Secondary | ICD-10-CM | POA: Insufficient documentation

## 2013-12-28 DIAGNOSIS — I471 Supraventricular tachycardia: Secondary | ICD-10-CM

## 2013-12-28 DIAGNOSIS — I10 Essential (primary) hypertension: Secondary | ICD-10-CM | POA: Insufficient documentation

## 2013-12-28 DIAGNOSIS — I498 Other specified cardiac arrhythmias: Secondary | ICD-10-CM | POA: Insufficient documentation

## 2013-12-28 DIAGNOSIS — IMO0002 Reserved for concepts with insufficient information to code with codable children: Secondary | ICD-10-CM

## 2013-12-28 HISTORY — PX: SUPRAVENTRICULAR TACHYCARDIA ABLATION: SHX5492

## 2013-12-28 HISTORY — PX: ELECTROPHYSIOLOGY STUDY: SHX5467

## 2013-12-28 HISTORY — PX: ABLATION: SHX5711

## 2013-12-28 LAB — GLUCOSE, CAPILLARY
Glucose-Capillary: 89 mg/dL (ref 70–99)
Glucose-Capillary: 98 mg/dL (ref 70–99)
Glucose-Capillary: 89 mg/dL (ref 70–99)

## 2013-12-28 LAB — POTASSIUM: Potassium: 3.6 mEq/L — ABNORMAL LOW (ref 3.7–5.3)

## 2013-12-28 SURGERY — ELECTROPHYSIOLOGY STUDY
Anesthesia: LOCAL

## 2013-12-28 MED ORDER — ACETAMINOPHEN 500 MG PO TABS: 1000.0000 mg | ORAL_TABLET | Freq: Four times a day (QID) | ORAL | Status: DC | PRN

## 2013-12-28 MED ORDER — MIDAZOLAM HCL 5 MG/5ML IJ SOLN
INTRAMUSCULAR | Status: AC
  Filled 2013-12-28: qty 5

## 2013-12-28 MED ORDER — FENTANYL CITRATE 0.05 MG/ML IJ SOLN
INTRAMUSCULAR | Status: AC
  Filled 2013-12-28: qty 2

## 2013-12-28 MED ORDER — FLUTICASONE PROPIONATE 50 MCG/ACT NA SUSP
2.0000 | Freq: Every day | NASAL | Status: DC
  Administered 2013-12-28 – 2013-12-29 (×2): 2 via NASAL
  Filled 2013-12-28: qty 16

## 2013-12-28 MED ORDER — HEPARIN (PORCINE) IN NACL 2-0.9 UNIT/ML-% IJ SOLN
INTRAMUSCULAR | Status: AC
  Filled 2013-12-28: qty 500

## 2013-12-28 MED ORDER — SODIUM CHLORIDE 0.9 % IV SOLN: 250.0000 mL | INTRAVENOUS | Status: DC | PRN

## 2013-12-28 MED ORDER — ACETAMINOPHEN 325 MG PO TABS
650.0000 mg | ORAL_TABLET | ORAL | Status: DC | PRN
  Administered 2013-12-28: 650 mg via ORAL

## 2013-12-28 MED ORDER — LOSARTAN POTASSIUM-HCTZ 100-25 MG PO TABS: 1.0000 | ORAL_TABLET | Freq: Every day | ORAL | Status: DC

## 2013-12-28 MED ORDER — GLIPIZIDE ER 10 MG PO TB24: 10.0000 mg | ORAL_TABLET | Freq: Every day | ORAL | Status: DC

## 2013-12-28 MED ORDER — DEXTROSE 50 % IV SOLN
INTRAVENOUS | Status: AC
  Filled 2013-12-28: qty 50

## 2013-12-28 MED ORDER — ATORVASTATIN CALCIUM 10 MG PO TABS
10.0000 mg | ORAL_TABLET | Freq: Every day | ORAL | Status: DC
  Administered 2013-12-28: 10 mg via ORAL
  Filled 2013-12-28 (×2): qty 1

## 2013-12-28 MED ORDER — ONDANSETRON HCL 4 MG/2ML IJ SOLN
4.0000 mg | Freq: Four times a day (QID) | INTRAMUSCULAR | Status: DC | PRN
  Filled 2013-12-28: qty 2

## 2013-12-28 MED ORDER — SODIUM CHLORIDE 0.9 % IJ SOLN
3.0000 mL | Freq: Two times a day (BID) | INTRAMUSCULAR | Status: DC
  Administered 2013-12-28: 3 mL via INTRAVENOUS

## 2013-12-28 MED ORDER — BUPIVACAINE HCL (PF) 0.25 % IJ SOLN
INTRAMUSCULAR | Status: AC
  Filled 2013-12-28: qty 60

## 2013-12-28 MED ORDER — DILTIAZEM HCL ER COATED BEADS 120 MG PO CP24
120.0000 mg | ORAL_CAPSULE | Freq: Every day | ORAL | Status: DC
  Administered 2013-12-28 – 2013-12-29 (×2): 120 mg via ORAL
  Filled 2013-12-28 (×2): qty 1

## 2013-12-28 MED ORDER — HYDROCHLOROTHIAZIDE 25 MG PO TABS
25.0000 mg | ORAL_TABLET | Freq: Every day | ORAL | Status: DC
  Administered 2013-12-28 – 2013-12-29 (×2): 25 mg via ORAL
  Filled 2013-12-28 (×2): qty 1

## 2013-12-28 MED ORDER — SODIUM CHLORIDE 0.9 % IJ SOLN: 3.0000 mL | INTRAMUSCULAR | Status: DC | PRN

## 2013-12-28 MED ORDER — GLIPIZIDE ER 5 MG PO TB24
15.0000 mg | ORAL_TABLET | Freq: Every day | ORAL | Status: DC
Start: 2013-12-29 — End: 2013-12-29
  Administered 2013-12-29: 15 mg via ORAL
  Filled 2013-12-28 (×2): qty 3

## 2013-12-28 MED ORDER — LOSARTAN POTASSIUM 50 MG PO TABS
100.0000 mg | ORAL_TABLET | Freq: Every day | ORAL | Status: DC
  Administered 2013-12-28 – 2013-12-29 (×2): 100 mg via ORAL
  Filled 2013-12-28 (×2): qty 2

## 2013-12-28 NOTE — CV Procedure (Signed)
Electrophysiology procedure note  Procedure: Electrophysiology study and catheter ablation of AV node reentrant tachycardia  Indication: Symptomatic SVT, despite medical therapy  Description of the procedure: After informed consent was obtained, the patient was taken to the diagnostic electrophysiology laboratory in the fasting state. After the usual preparation and draping, a 6 French quadripolar catheter was inserted percutaneously into the right femoral vein and advanced to the right ventricle. A 6 French quadripolar catheter was inserted percutaneously into the right femoral vein and advanced to the His bundle region. A 6 French hexapolar catheter was inserted percutaneously into the right jugular vein and advanced under fluoroscopic guidance to the coronary sinus. After measurement of the basic intervals, rapid ventricular pacing was carried out from the right ventricle at 600 ms. The pacing cycle length was stepwise decreased down to 320 ms where VA block was demonstrated. During rapid ventricular pacing, the atrial activation was midline and decremental. Next programmed ventricular stimulation was carried out from the right ventricle at a pacing cycle length of 600 ms. The S1-S2 interval was stepwise decreased from 500 ms down to 230 ms where the ventricular ERP was demonstrated. During programmed ventricular stimulation, the atrial activation was midline and decremental. There was a VA jump noted. There is no inducible SVT. Next programmed atrial stimulation was carried out from the coronary sinus at 500 ms. The S1 and S2 interval was stepwise decreased from 440 ms down to 290 ms, resulting in the initiation of SVT. The cycle length of SVT was approximately 430 ms. The VA interval was essentially 0. The atrial activation was midline. PVCs placed at the time of His bundle refractoriness did not preexcitation. During tachycardia, ventricular pacing demonstrated a VAV activation sequence. Finally, the  tachycardia was terminated by pacing in the atrium at 300 ms. Next rapid atrial pacing was carried out from the coronary sinus and stepwise decreased down to 330 ms were A-V block was demonstrated. The PR interval was equal to the RR interval. There was no inducible SVT with rapid atrial pacing. At this point a 7 [place] ablation catheter was maneuvered via the right femoral vein into the right atrium. Mapping was carried out. Koch's triangle was displaced superiorly. 11 RF energy applications were delivered. During RF application, accelerated junction rhythm was present but there would be VA block and the catheter moved immediately. Following the 9th RFA there was no inducible SVT and the patient was observed for 30 minutes. Additional AEST was carried out and the patient had recurrent, slower  AVNRT at 460 ms. At this point 2 additional RF's were delivered and during the last RF, there was prolonged junctional rhythm and after there was no inducible SVT. The catheters were removed, and the patient was returned to the recovery area in satisfactory condition.  Complications: no immediate complications.  Results. A. Baseline ECG. The baseline ECG demonstrated nsr with normal axis and intervals. B. Baseline intervals. The sinus node cycle length was 823 ms, the AH interval was 85, and HV - 59, QRS duration - 100 ms. C. Rapid ventricular pacing. Rapid ventricular pacing was carried out at 500 ms and decreased down to 320 ms where VA block was demonstrated. D. Programmed Ventricular stimulation. Programmed ventricular stimulation was carried out at an S1 coupling interval of 500/440 and stepwise decreased down to 230 ms where ventricular refractoriness was demonstrated. The atrial activation appeared midline and decremental E. Rapid ventricular pacing. RV pacing was carried out from the RV at 600 ms and stepwise decreased down  to 320 ms where VA block was observed. F. Programmed atrial stimulation was carried  out at 500 ms. The S1S2 interval was stepwise decreased down to 290 ms where SVT was induced. Following ablation, the S1S2 interval was stepwise decreased down to 260 ms and there was no inducible SVT. G. Rapid atrial pacing. Rapid atrial pacing was carried out at 500 ms and stepwise decreased down to 330 ms where AV block was demonstrated. During rapid atrial pacing, there was no inducible SVT and the PR interval was less than or equal to the RR interval. H. Arrhythmias observed. AVNRT, initiation was with AEST, duration was sustained and the episode was terminated with RAP. I. Mapping. Mapping of Koch's triangle demonstrated that the region was displaced superiorly. J. RF energy application. 11 RF energy applications were delivered resulting in accelerated junction rhytm.   Conclusion. Successful EPS/RFA of AVNRT with 11 RF energy applications, applied very close to the region of the AV node.  [name].D.

## 2013-12-28 NOTE — Progress Notes (Signed)
Site area: right groin; venous sheaths x3  Site Prior to Removal:  Level 0  Pressure Applied For 15 MINUTES    Manual: yes  Patient Status During Pull:  Stable  Post Pull Groin Site:  Level 0  Post Pull Instructions Given:  Yes.    Post Pull Pulses Present:  Yes.    Dressing Applied:  Yes.     Bedrest begins @ 11:15  Comments Patient stable during sheath pull

## 2013-12-28 NOTE — Progress Notes (Signed)
Site area: right IJ  Site Prior to Removal:  Level 0  Pressure Applied For 15  MINUTES    Manual:   Yes.    Patient Status During Pull:  Stable   Post Pull IJ Site:  Level 0  Post Pull Instructions Given:  Yes.    Dressing Applied:  Yes.

## 2013-12-28 NOTE — H&P (Signed)
HPI [name] is referred today by Dr. Domenic Polite for evaluation of SVT. She is a pleasant 43 yo woman with a h/o tachypalpitations and HTN. Her history dates back 5 years when she presented with SVT at 170/min. The patient describes being treated with IV adenosine with no termination of her rhythm and the IV cardizem with resolution of her symptoms. Her longest episode of palpitations occurred for almost 8 hours. She takes po cardizem. Her symptoms start and stop suddenly, and are associated with sob, near syncope and chest pressure. Allergies   Allergen  Reactions   .  Codeine  Nausea And Vomiting   .  Metoclopramide Hcl  Other (See Comments)       Not in right state of mind           Current Outpatient Prescriptions   Medication  Sig  Dispense  Refill   .  acetaminophen (TYLENOL) 500 MG tablet  Take 1,000 mg by mouth every 6 (six) hours as needed. Pain         .  diltiazem (CARDIZEM CD) 120 MG 24 hr capsule  Take 1 capsule (120 mg total) by mouth daily.   90 capsule   3   .  diltiazem (CARDIZEM) 30 MG tablet  Use cardizem 30 mg as needed for breakthrough palpitations   90 tablet   3   .  fluticasone (FLONASE) 50 MCG/ACT nasal spray  Place 2 sprays into both nostrils daily.   48 g   1   .  glipiZIDE (GLUCOTROL XL) 10 MG 24 hr tablet  Take 1 tablet (10 mg total) by mouth daily with breakfast.   90 tablet   1   .  glipiZIDE (GLUCOTROL XL) 5 MG 24 hr tablet  Take 1 tablet (5 mg total) by mouth daily with breakfast.   90 tablet   3   .  rosuvastatin (CRESTOR) 5 MG tablet  Take 1 tablet (5 mg total) by mouth at bedtime.   90 tablet   1   .  losartan-hydrochlorothiazide (HYZAAR) 100-25 MG per tablet  Take 1 tablet by mouth daily.   90 tablet   3       No current facility-administered medications for this visit.           Past Medical History   Diagnosis  Date   .  Essential hypertension, benign     .  Supraventricular tachycardia     .  Weight loss         Hopitalized 4 years ago  after loosing 200lb on a supervised program , malnurished with severe protein deficiency    .  Pneumonia  2007   .  Obesity         Since childhood    .  Migraine headache  2007   .  Spinal disease     .  Type 2 diabetes mellitus          ROS:    All systems reviewed and negative except as noted in the HPI.      Past Surgical History   Procedure  Laterality  Date   .  Tubal ligation    1996           Family History   Problem  Relation  Age of Onset   .  Hypertension  Mother     .  Diabetes  Mother     .  Cancer  Mother  Uterine    .  Heart failure  Mother         CABG   .  Cancer  Father  9       Prostate    .  Hypertension  Father     .  Bradycardia  Father         Irregular heart beat    .  Diabetes  Brother     .  Diabetes  Brother     .  Hypertension  Brother     .  Hypertension  Brother             History       Social History   .  Marital Status:  Single       Spouse Name:  N/A       Number of Children:  2   .  Years of Education:  N/A       Occupational History   .  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  Smoking status:  Former Smoker -- 0.50 packs/day for 7 years       Types:  Cigarettes       Quit date:  08/05/2012   .  Smokeless tobacco:  Never Used   .  Alcohol Use:  No   .  Drug Use:  No   .  Sexual Activity:  Not on file       Other Topics  Concern   .  Not on file       Social History Narrative   .  No narrative on file          BP 125/77  Pulse 83  Ht 5\' 11"  (1.803 m)  Wt 288 lb (130.636 kg)  BMI 40.19 kg/m2   Physical Exam:   Obese appearing NAD HEENT: Unremarkable Neck:  No JVD, no thyromegally Back:  No CVA tenderness Lungs:  Clear with no wheezes, rales or rhonchi. HEART:  Regular rate rhythm, no murmurs, no rubs, no clicks Abd:  soft, positive bowel sounds, no organomegally, no rebound, no guarding Ext:  2 plus pulses, no edema, no cyanosis, no clubbing Skin:  No  rashes no nodules Neuro:  CN II through XII intact, motor grossly intact   EKG - nsr with no pre-excitation     Assess/Plan:         SUPRAVENTRICULAR TACHYCARDIA -     Status: Written Related Problem: SUPRAVENTRICULAR TACHYCARDIA    We need to try and obtain a 12 lead ECG of her SVT. I suspect that she has atrial tachycardia based on her response to adenosine. I discussed the treatment options including the risks/benefits of catheter ablation as well as anti-arrhythmic therapy with flecainide. She is strongly considering catheter ablation. If so, we will want CARTO.     EP Attending  Patient seen and examined. Since her prior clinic visit 5 weeks ago she has decided to proceed with catheter ablation.   Mikle Bosworth.D.

## 2013-12-28 NOTE — Anesthesia Preprocedure Evaluation (Deleted)
Anesthesia Evaluation  Patient identified by MRN, date of birth, ID band Patient awake    Reviewed: Allergy & Precautions, H&P , NPO status , Patient's Chart, lab work & pertinent test results  Airway       Dental   Pulmonary former smoker,          Cardiovascular hypertension, + dysrhythmias     Neuro/Psych  Headaches,    GI/Hepatic   Endo/Other  diabetes, Type 2, Oral Hypoglycemic AgentsMorbid obesity  Renal/GU      Musculoskeletal   Abdominal   Peds  Hematology   Anesthesia Other Findings   Reproductive/Obstetrics                           Anesthesia Physical Anesthesia Plan  ASA: III  Anesthesia Plan: General and MAC   Post-op Pain Management:    Induction: Intravenous  Airway Management Planned: LMA  Additional Equipment:   Intra-op Plan:   Post-operative Plan: Extubation in OR  Informed Consent: I have reviewed the patients History and Physical, chart, labs and discussed the procedure including the risks, benefits and alternatives for the proposed anesthesia with the patient or authorized representative who has indicated his/her understanding and acceptance.     Plan Discussed with:   Anesthesia Plan Comments:         Anesthesia Quick Evaluation

## 2013-12-29 DIAGNOSIS — I471 Supraventricular tachycardia: Secondary | ICD-10-CM

## 2013-12-29 LAB — GLUCOSE, CAPILLARY: GLUCOSE-CAPILLARY: 103 mg/dL — AB (ref 70–99)

## 2013-12-29 NOTE — Discharge Instructions (Signed)
Cardiac Ablation  Cardiac ablation is a procedure to stop some heart tissue from causing problems. The heart has many electrical connections. Sometimes these connections cause the heart to beat very fast or irregularly. Removing some of the problem areas can improve heart rhythm or make it normal. Ablation is done for people who:   Have Wolff-Parkinson-White syndrome.  Have other fast heart rhythms (tachycardia).  Have taken medicines for an abnormal heart rhythm (arrhythmia) and the medicines had:  No success.  Side effects.  May have a type of heartbeat that could cause death. BEFORE THE PROCEDURE   Follow instructions from your doctor about eating and drinking before the procedure.  Take your medicines as told by your doctor. Take them at regular times with water unless told differently by your doctor.  If you are taking diabetes medicine, ask your doctor how to take it. Ask if there are any special instructions you should follow. Your doctor may change how much insulin you take the day of the procedure. PROCEDURE   A special type of X-ray will be used. The X-ray helps your doctor see images of your heart during the procedure.  A small cut (incision) will be made in your neck or groin.  An IV tube will be started before the procedure begins.  You will be given a numbing medicine (anesthetic) or a medicine to help you relax (sedative).  The skin on your neck or groin will be numbed.  A needle will be put into a large vein in your neck or groin.  A thin, flexible tube (catheter) will be put in to reach your heart.  A dye will be put in the tube. The dye will show up on X-rays. It will help your doctor see the area of the heart that needs treatment.  When the heart tissue that is causing problems is found, the tip of the tube will send an electrical current to it. This will stop it from causing problems.  The tube will be taken out.  Pressure will be put on the area where  the tube was. This will keep it from bleeding. A bandage will be placed over the area.  The procedure can take 1-6 hours to complete. AFTER THE PROCEDURE  You will be taken to a recovery area. Your blood pressure, heart rate, and breathing will be watched. The area where the tube was will also be watched for bleeding.  You will need to lie still for 4-6 hours. This keeps the area where the tube was from bleeding.  If your blood pressure, heart rate, and breathing are stable and if no bleeding occurs, you may go home.  If complications occur or your doctor feels you should be watched, you may need to stay in the hospital overnight. Document Released: 02/08/2013 Document Reviewed: 02/08/2013 [facility] Patient Information 2015 [place]. This information is not intended to replace advice given to you by your health care provider. Make sure you discuss any questions you have with your health care provider.

## 2013-12-29 NOTE — Progress Notes (Signed)
Reviewed discharge instructions with patient and family, they stated their understanding.  Discharged home with family.  Sanda Linger

## 2013-12-29 NOTE — Discharge Summary (Signed)
ELECTROPHYSIOLOGY PROCEDURE DISCHARGE SUMMARY    Patient ID: [name],  MRN: [id number], DOB/AGE: 1970/08/16 43 y.o.  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  Hypertension 2.  Diabetes 3.  Obesity  Allergies  Allergen Reactions  . Codeine Nausea And Vomiting  . Metoclopramide Hcl Other (See Comments)    Not in right state of mind     Procedures This Admission:  1.  Electrophysiology study and radiofrequency catheter ablation of AVNRT on 12-28-2013 by Dr Lovena Le.  This study demonstrated successful ablation of AVNRT.  There were no early post procedure complications.   Brief HPI: [name] is a 43 y.o. female with a 5 year history of tachypalpitations.  Her episodes have been treated with adenosine and cardizem and are lifestyle limiting.  The patient was referred to EP in the outpatient setting for treatment options.  Risks, benefits, and alternatives to ablation were reviewed with the patient who wished to proceed.   Hospital Course:  The patient was admitted and underwent ablation of AVNRT with details as outlined above.  She was monitored on telemetry overnight which demonstrated sinus rhythm.  Her groin and neck incisions were without complication.  She was examined by Dr Rayann Heman and considered stable for discharge to home.  Wound care and activity restrictions were reviewed with the patient.   Discharge Vitals: Blood pressure 96/59, pulse 78, temperature 98.1 F (36.7 C), temperature source Oral, resp. rate 20, height 5\' 11"  (1.803 m), weight 279 lb 1.6 oz (126.6 kg), last menstrual period 12/06/2013, SpO2 97.00%.  Physical Exam: Filed Vitals:   12/28/13 1122 12/28/13 1127 12/28/13 2025 12/29/13 0500  BP: 130/69 121/71 90/47 96/59     Pulse: 77 75 64 78  Temp:   98.4 F (36.9 C) 98.1 F (36.7 C)  TempSrc:   Oral Oral  Resp: 22 24 20 20   Height:      Weight:    279 lb 1.6 oz (126.6 kg)  SpO2: 97% 97% 96% 97%    GEN- The patient is well appearing, alert and oriented x 3 today.   Head- normocephalic, atraumatic Eyes-  Sclera clear, conjunctiva pink Ears- hearing intact Oropharynx- clear Neck- supple, Lungs- Clear to ausculation bilaterally, normal work of breathing Heart- Regular rate and rhythm, no murmurs, rubs or gallops, PMI not laterally displaced GI- soft, NT, ND, + BS Extremities- no clubbing, cyanosis, or edema, groin is without hematoma/ bruit Neuro- strength and sensation are intact   Labs:   Lab Results  Component Value Date   WBC 8.9 12/21/2013   HGB 12.7 12/21/2013   HCT 37.9 12/21/2013   MCV 82.5 12/21/2013   PLT 336.0 12/21/2013     Recent Labs Lab 12/28/13 0622  K 3.6*     Discharge Medications:    Medication List    ASK your doctor about these medications       acetaminophen 500 MG tablet  Commonly known as:  TYLENOL  Take 1,000 mg by mouth every 6 (six) hours as needed (for pain).     diltiazem 120 MG 24 hr capsule  Commonly known as:  CARDIZEM CD  Take 1 capsule (120 mg total) by mouth daily.     diltiazem 30 MG tablet  Commonly known as:  CARDIZEM  Take 30 mg by mouth as needed (for  breakthrough palpitations).     fluticasone 50 MCG/ACT nasal spray  Commonly known as:  FLONASE  Place 2 sprays into both nostrils daily.     glipiZIDE 10 MG 24 hr tablet  Commonly known as:  GLUCOTROL XL  Take 10 mg by mouth daily with breakfast. Take 5 mg and 10 mg to equal 15 mg     glipiZIDE 5 MG 24 hr tablet  Commonly known as:  GLUCOTROL XL  Take 5 mg by mouth daily with breakfast. Take 5 mg and 10 mg to equal 15 mg     losartan-hydrochlorothiazide 100-25 MG per tablet  Commonly known as:  HYZAAR  Take 1 tablet by mouth daily.     rosuvastatin 5 MG tablet  Commonly known as:   CRESTOR  Take 1 tablet (5 mg total) by mouth at bedtime.        Disposition:     Duration of Discharge Encounter: Greater than 30 minutes including physician time.  Signed,   Thompson Grayer MD

## 2014-01-08 ENCOUNTER — Encounter: Payer: Self-pay | Admitting: Family Medicine

## 2014-01-22 LAB — COMPLETE METABOLIC PANEL WITH GFR
ALBUMIN: 4.1 g/dL (ref 3.5–5.2)
ALT: 14 U/L (ref 0–35)
AST: 14 U/L (ref 0–37)
Alkaline Phosphatase: 62 U/L (ref 39–117)
BUN: 14 mg/dL (ref 6–23)
CALCIUM: 9.4 mg/dL (ref 8.4–10.5)
CHLORIDE: 101 meq/L (ref 96–112)
CO2: 27 mEq/L (ref 19–32)
Creat: 0.85 mg/dL (ref 0.50–1.10)
GFR, EST NON AFRICAN AMERICAN: 84 mL/min
GFR, Est African American: >89 mL/min
Glucose, Bld: 94 mg/dL (ref 70–99)
POTASSIUM: 4.1 meq/L (ref 3.5–5.3)
Sodium: 138 mEq/L (ref 135–145)
Total Bilirubin: 0.3 mg/dL (ref 0.2–1.2)
Total Protein: 7.1 g/dL (ref 6.0–8.3)

## 2014-01-22 LAB — LIPID PANEL
CHOLESTEROL: 88 mg/dL (ref 0–200)
HDL: 33 mg/dL — AB (ref >39)
LDL Cholesterol: 34 mg/dL (ref 0–99)
Total CHOL/HDL Ratio: 2.7 Ratio
Triglycerides: 105 mg/dL (ref <150)
VLDL: 21 mg/dL (ref 0–40)

## 2014-01-22 LAB — HEMOGLOBIN A1C
Hgb A1c MFr Bld: 6.4 % — ABNORMAL HIGH (ref <5.7)
Mean Plasma Glucose: 137 mg/dL — ABNORMAL HIGH (ref <117)

## 2014-01-25 ENCOUNTER — Ambulatory Visit (INDEPENDENT_AMBULATORY_CARE_PROVIDER_SITE_OTHER): Payer: 59 | Admitting: Family Medicine

## 2014-01-25 ENCOUNTER — Encounter: Payer: Self-pay | Admitting: Family Medicine

## 2014-01-25 VITALS — BP 104/70 | HR 100 | Resp 18 | Ht 71.0 in | Wt 274.1 lb

## 2014-01-25 DIAGNOSIS — I10 Essential (primary) hypertension: Secondary | ICD-10-CM

## 2014-01-25 DIAGNOSIS — E118 Type 2 diabetes mellitus with unspecified complications: Principal | ICD-10-CM

## 2014-01-25 DIAGNOSIS — Z23 Encounter for immunization: Secondary | ICD-10-CM

## 2014-01-25 DIAGNOSIS — J309 Allergic rhinitis, unspecified: Secondary | ICD-10-CM

## 2014-01-25 DIAGNOSIS — E1165 Type 2 diabetes mellitus with hyperglycemia: Secondary | ICD-10-CM

## 2014-01-25 DIAGNOSIS — IMO0002 Reserved for concepts with insufficient information to code with codable children: Secondary | ICD-10-CM

## 2014-01-25 DIAGNOSIS — E669 Obesity, unspecified: Secondary | ICD-10-CM

## 2014-01-25 DIAGNOSIS — E785 Hyperlipidemia, unspecified: Secondary | ICD-10-CM

## 2014-01-25 MED ORDER — METFORMIN HCL ER (OSM) 500 MG PO TB24: 500.0000 mg | ORAL_TABLET | Freq: Every day | ORAL | Status: DC

## 2014-01-25 NOTE — Patient Instructions (Addendum)
F/u in 3 month, call if you need me before  CONGRATS , keep it up  HBA1C , chem 7 and EGFR  In 3 month  New for diabetes, metformin 557m daily and reduce the glucotrol from 15 mg daily to 10 mg daily, call if blood sugars are high.  KEEP counting carbs!  Prevnar today

## 2014-01-26 DIAGNOSIS — Z23 Encounter for immunization: Secondary | ICD-10-CM | POA: Insufficient documentation

## 2014-01-26 DIAGNOSIS — E785 Hyperlipidemia, unspecified: Secondary | ICD-10-CM | POA: Insufficient documentation

## 2014-01-26 DIAGNOSIS — E1169 Type 2 diabetes mellitus with other specified complication: Secondary | ICD-10-CM | POA: Insufficient documentation

## 2014-01-26 DIAGNOSIS — E663 Overweight: Secondary | ICD-10-CM | POA: Insufficient documentation

## 2014-01-26 NOTE — Assessment & Plan Note (Signed)
Controlled, no change in medication  

## 2014-01-26 NOTE — Assessment & Plan Note (Signed)
Improved TG, needs to commit to exercise to improve HDl Continue statin therapy due to dx of DM

## 2014-01-26 NOTE — Assessment & Plan Note (Signed)
Improved. Pt applauded on succesful weight loss through lifestyle change, and encouraged to continue same. Weight loss goal set for the next several months.  

## 2014-01-26 NOTE — Assessment & Plan Note (Signed)
Vaccine administered.

## 2014-01-26 NOTE — Assessment & Plan Note (Signed)
Controlled, no change in medication DASH diet and commitment to daily physical activity for a minimum of 30 minutes discussed and encouraged, as a part of hypertension management. The importance of attaining a healthy weight is also discussed.  

## 2014-01-26 NOTE — Progress Notes (Signed)
   Subjective:    Patient ID: [name], female    DOB: 07-26-1970, 43 y.o.   MRN: [id number]  HPI The PT is here for follow up and re-evaluation of chronic medical conditions, medication management and review of any available recent lab and radiology data.  Preventive health is updated, specifically  Cancer screening and Immunization.   She has had ablation since last seen, still has palpitations , but less frequently, fells "much better" The PT denies any adverse reactions to current medications since the last visit.  There are no new concerns. She has been very diligent with dietary change with great success with weight loss and improved blood sugars , she is also now testing her blood sugars Blood sugars are sometimes down to 70's, has not yet committed to regular exercise but intends to do so       Review of Systems See HPI Denies recent fever or chills. Denies sinus pressure, nasal congestion, ear pain or sore throat. Denies chest congestion, productive cough or wheezing. Denies chest pains, PND or orthopnea and leg swelling Denies abdominal pain, nausea, vomiting,diarrhea or constipation.   Denies dysuria, frequency, hesitancy or incontinence. Denies joint pain, swelling and limitation in mobility. Denies headaches, seizures, numbness, or tingling. Denies depression, anxiety or insomnia. Denies skin break down or rash.        Objective:   Physical Exam BP 104/70  Pulse 100  Resp 18  Ht 5\' 11"  (1.803 m)  Wt 274 lb 1.9 oz (124.34 kg)  BMI 38.25 kg/m2  SpO2 95% Patient alert and oriented and in no cardiopulmonary distress.  HEENT: No facial asymmetry, EOMI,   oropharynx pink and moist.  Neck supple no JVD, no mass.  Chest: Clear to auscultation bilaterally.  CVS: S1, S2 no murmurs, no S3.Regular rate.  ABD: Soft non tender.   Ext: No edema  MS: Adequate ROM spine, shoulders, hips and knees.  Skin: Intact, no ulcerations or rash noted.  Psych: Good  eye contact, normal affect. Memory intact not anxious or depressed appearing.  CNS: CN 2-12 intact, power,  normal throughout.no focal deficits noted.        Assessment & Plan:  Diabetes mellitus type 2, uncontrolled, with complications Marked improvement on medication and with dietary change. Add metformin and reduce glipizide dose Patient advised to reduce carb and sweets, commit to regular physical activity, take meds as prescribed, test blood as directed, and attempt to lose weight, to improve blood sugar control. Pt applauded on success  Essential hypertension, benign Controlled, no change in medication DASH diet and commitment to daily physical activity for a minimum of 30 minutes discussed and encouraged, as a part of hypertension management. The importance of attaining a healthy weight is also discussed.   Hyperlipidemia LDL goal <100 Improved TG, needs to commit to exercise to improve HDl Continue statin therapy due to dx of DM  ALLERGIC RHINITIS CAUSE UNSPECIFIED Controlled, no change in medication   Obesity (BMI 30-39.9) Improved. Pt applauded on succesful weight loss through lifestyle change, and encouraged to continue same. Weight loss goal set for the next several months.   Need for vaccination with 13-polyvalent pneumococcal conjugate vaccine Vaccine administered

## 2014-01-26 NOTE — Assessment & Plan Note (Signed)
Marked improvement on medication and with dietary change. Add metformin and reduce glipizide dose Patient advised to reduce carb and sweets, commit to regular physical activity, take meds as prescribed, test blood as directed, and attempt to lose weight, to improve blood sugar control. Pt applauded on success

## 2014-02-01 ENCOUNTER — Encounter: Payer: Self-pay | Admitting: Cardiology

## 2014-02-01 ENCOUNTER — Ambulatory Visit (INDEPENDENT_AMBULATORY_CARE_PROVIDER_SITE_OTHER): Payer: 59 | Admitting: Cardiology

## 2014-02-01 VITALS — BP 108/68 | HR 70 | Ht 71.0 in | Wt 272.0 lb

## 2014-02-01 DIAGNOSIS — I471 Supraventricular tachycardia: Secondary | ICD-10-CM

## 2014-02-01 DIAGNOSIS — I498 Other specified cardiac arrhythmias: Secondary | ICD-10-CM

## 2014-02-01 NOTE — Progress Notes (Signed)
Clinical Summary [name] is a 43 y.o.female last seen in May of this year with history of PSVT. I referred her to Dr. Lovena Le for EP consultation to discuss radiofrequency ablation. She underwent successful ablation of AVNRT in July.  She has been doing well since hospital discharge. No significant palpitations at this time. ECG today shows normal sinus rhythm. She will be seeing Dr. Lovena Le soon.  Recent lab work showed cholesterol 88, triglycerides 105, HDL 33, LDL 34, potassium 4.1, BUN 14, creatinine 0.8, normal LFTs.  Allergies  Allergen Reactions  . Codeine Nausea And Vomiting  . Metoclopramide Hcl Other (See Comments)    Not in right state of mind    Current Outpatient Prescriptions  Medication Sig Dispense Refill  . acetaminophen (TYLENOL) 500 MG tablet Take 1,000 mg by mouth every 6 (six) hours as needed (for pain).       Marland Kitchen diltiazem (CARDIZEM CD) 120 MG 24 hr capsule Take 1 capsule (120 mg total) by mouth daily.  90 capsule  3  . diltiazem (CARDIZEM) 30 MG tablet Take 30 mg by mouth as needed (for breakthrough palpitations).      . fluticasone (FLONASE) 50 MCG/ACT nasal spray Place 2 sprays into both nostrils daily.  48 g  1  . glipiZIDE (GLUCOTROL XL) 10 MG 24 hr tablet Take 10 mg by mouth daily with breakfast. Take 10mg  daily      . losartan-hydrochlorothiazide (HYZAAR) 100-25 MG per tablet Take 1 tablet by mouth daily.  90 tablet  3  . metformin (FORTAMET) 500 MG (OSM) 24 hr tablet Take 1 tablet (500 mg total) by mouth daily with breakfast.  90 tablet  1  . rosuvastatin (CRESTOR) 5 MG tablet Take 1 tablet (5 mg total) by mouth at bedtime.  90 tablet  1   No current facility-administered medications for this visit.    Past Medical History  Diagnosis Date  . Essential hypertension, benign   . Supraventricular tachycardia   . Weight loss     Hopitalized 4 years ago after loosing 200lb on a supervised program , malnurished with severe protein deficiency   .  Pneumonia 2007  . Obesity     Since childhood   . Migraine headache 2007  . Spinal disease   . Type 2 diabetes mellitus     Social History [name] reports that she quit smoking about 17 months ago. Her smoking use included Cigarettes. She has a 3.5 pack-year smoking history. She has never used smokeless tobacco. [name] reports that she does not drink alcohol.  Review of Systems Other systems reviewed and negative.  Physical Examination Filed Vitals:   02/01/14 1026  BP: 108/68  Pulse: 70   Filed Weights   02/01/14 1026  Weight: 272 lb (123.378 kg)    Obese woman in no acute distress.  HEENT: Conjunctiva and lids normal, oropharynx with moist mucosa.  Neck: Supple, no elevated JVP or carotid bruits, no thyromegaly.  Lungs: Clear to auscultation, and nonlabored.  Cardiac: Regular rate and rhythm, soft systolic murmur at the base, no S3 gallop.  Skin: Warm and dry.  Extremities: No pitting edema, distal pulses full.  Neuropsychiatric: Alert and oriented x3, affect appropriate.   Problem List and Plan   SUPRAVENTRICULAR TACHYCARDIA Now status post radiofrequency ablation of AVNRT by Dr. Lovena Le. Patient is doing well. ECG is normal today. She will be seeing Dr. Lovena Le soon in followup. I expect that she might be able to stop her Cardizem  CD and have short acting Cardizem for as needed management of palpitations. Followup scheduled.    Satira Sark, M.D., F.A.C.C.

## 2014-02-01 NOTE — Patient Instructions (Signed)
Your physician wants you to follow-up in: 1 year You will receive a reminder letter in the mail two months in advance. If you don't receive a letter, please call our office to schedule the follow-up appointment.     Your physician recommends that you continue on your current medications as directed. Please refer to the Current Medication list given to you today.     Follow up with Dr.Taylor as planned     Thank you for choosing [facility] !

## 2014-02-01 NOTE — Assessment & Plan Note (Signed)
Now status post radiofrequency ablation of AVNRT by Dr. Lovena Le. Patient is doing well. ECG is normal today. She will be seeing Dr. Lovena Le soon in followup. I expect that she might be able to stop her Cardizem CD and have short acting Cardizem for as needed management of palpitations. Followup scheduled.

## 2014-02-07 ENCOUNTER — Ambulatory Visit (INDEPENDENT_AMBULATORY_CARE_PROVIDER_SITE_OTHER): Payer: 59 | Admitting: Internal Medicine

## 2014-02-07 ENCOUNTER — Encounter: Payer: Self-pay | Admitting: Internal Medicine

## 2014-02-07 VITALS — BP 82/60 | HR 88 | Ht 71.0 in | Wt 273.0 lb

## 2014-02-07 DIAGNOSIS — I498 Other specified cardiac arrhythmias: Secondary | ICD-10-CM

## 2014-02-07 DIAGNOSIS — I471 Supraventricular tachycardia: Secondary | ICD-10-CM

## 2014-02-07 NOTE — Patient Instructions (Signed)
Your physician has recommended you make the following change in your medication:  1) STOP Cardizem  Follow up with Dr. Lovena Le as needed.

## 2014-02-07 NOTE — Progress Notes (Signed)
HPI [name] returns today for followup. She is a pleasant 43 yo woman with SVT who underwent catheter ablation several weeks ago. At that time she had inducible AVNRT and underwent catheter ablation. She has done well in the interim. She notes that her blood pressure runs a little low but she has been asymptomatic. She has stayed on Cardizem. Allergies  Allergen Reactions  . Codeine Nausea And Vomiting  . Metoclopramide Hcl Other (See Comments)    Not in right state of mind     Current Outpatient Prescriptions  Medication Sig Dispense Refill  . acetaminophen (TYLENOL) 500 MG tablet Take 1,000 mg by mouth every 6 (six) hours as needed (for pain).       Marland Kitchen diltiazem (CARDIZEM CD) 120 MG 24 hr capsule Take 1 capsule (120 mg total) by mouth daily.  90 capsule  3  . diltiazem (CARDIZEM) 30 MG tablet Take 30 mg by mouth as needed (for breakthrough palpitations).      . fluticasone (FLONASE) 50 MCG/ACT nasal spray Place 2 sprays into both nostrils daily.  48 g  1  . glipiZIDE (GLUCOTROL XL) 10 MG 24 hr tablet Take 10 mg by mouth daily with breakfast. Take 10mg  daily      . losartan-hydrochlorothiazide (HYZAAR) 100-25 MG per tablet Take 1 tablet by mouth daily.  90 tablet  3  . metformin (FORTAMET) 500 MG (OSM) 24 hr tablet Take 1 tablet (500 mg total) by mouth daily with breakfast.  90 tablet  1  . rosuvastatin (CRESTOR) 5 MG tablet Take 1 tablet (5 mg total) by mouth at bedtime.  90 tablet  1   No current facility-administered medications for this visit.     Past Medical History  Diagnosis Date  . Essential hypertension, benign   . Supraventricular tachycardia   . Weight loss     Hopitalized 4 years ago after loosing 200lb on a supervised program , malnurished with severe protein deficiency   . Pneumonia 2007  . Obesity     Since childhood   . Migraine headache 2007  . Spinal disease   . Type 2 diabetes mellitus     ROS:   All systems reviewed and negative except as  noted in the HPI.   Past Surgical History  Procedure Laterality Date  . Tubal ligation  1996  . Ablation  12-28-2013    slow pathway modification of AVNRT by Dr Lovena Le     Family History  Problem Relation Age of Onset  . Hypertension Mother   . Diabetes Mother   . Cancer Mother     Uterine   . Heart failure Mother     CABG  . Cancer Father 71    Prostate   . Hypertension Father   . Bradycardia Father     Irregular heart beat   . Diabetes Brother   . Diabetes Brother   . Hypertension Brother   . Hypertension Brother      History   Social History  . Marital Status: Married    Spouse Name: [name]    Number of Children: 2  . Years of Education: [name]   Occupational History  . 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 Smoking status: Former Smoker -- 0.50 packs/day for 7 years    Types: Cigarettes    Quit date: 08/05/2012  . Smokeless tobacco: Never Used  . Alcohol Use: No  . Drug Use: No  .  Sexual Activity: Not on file   Other Topics Concern  . Not on file   Social History Narrative  . No narrative on file     BP 82/60  Pulse 88  Ht 5\' 11"  (1.803 m)  Wt 273 lb (123.832 kg)  BMI 38.09 kg/m2  Physical Exam:  Well appearing 43 yo woman, NAD HEENT: Unremarkable Neck:  No JVD, no thyromegally Back:  No CVA tenderness Lungs:  Clear with no wheezes HEART:  Regular rate rhythm, no murmurs, no rubs, no clicks Abd:  soft, positive bowel sounds, no organomegally, no rebound, no guarding Ext:  2 plus pulses, no edema, no cyanosis, no clubbing Skin:  No rashes no nodules Neuro:  CN II through XII intact, motor grossly intact  EKG - nsr  Assess/Plan:

## 2014-02-07 NOTE — Assessment & Plan Note (Signed)
She is s/p EPS/catheter ablation. She is doing well and has had no recurrent arrhythmias. I will see her back as needed.

## 2014-04-30 LAB — COMPLETE METABOLIC PANEL WITH GFR
ALK PHOS: 52 U/L (ref 39–117)
ALT: 10 U/L (ref 0–35)
AST: 11 U/L (ref 0–37)
Albumin: 3.8 g/dL (ref 3.5–5.2)
BILIRUBIN TOTAL: 0.2 mg/dL (ref 0.2–1.2)
BUN: 17 mg/dL (ref 6–23)
CO2: 25 meq/L (ref 19–32)
CREATININE: 0.92 mg/dL (ref 0.50–1.10)
Calcium: 9.1 mg/dL (ref 8.4–10.5)
Chloride: 103 mEq/L (ref 96–112)
GFR, EST AFRICAN AMERICAN: 88 mL/min
GFR, EST NON AFRICAN AMERICAN: 76 mL/min
GLUCOSE: 97 mg/dL (ref 70–99)
Potassium: 4.1 mEq/L (ref 3.5–5.3)
Sodium: 138 mEq/L (ref 135–145)
Total Protein: 6.8 g/dL (ref 6.0–8.3)

## 2014-04-30 LAB — HEMOGLOBIN A1C
Hgb A1c MFr Bld: 5.7 % — ABNORMAL HIGH (ref <5.7)
Mean Plasma Glucose: 117 mg/dL — ABNORMAL HIGH (ref <117)

## 2014-05-02 ENCOUNTER — Encounter: Payer: Self-pay | Admitting: Family Medicine

## 2014-05-02 ENCOUNTER — Ambulatory Visit (INDEPENDENT_AMBULATORY_CARE_PROVIDER_SITE_OTHER): Payer: 59 | Admitting: Family Medicine

## 2014-05-02 VITALS — BP 96/70 | HR 82 | Resp 16 | Ht 71.0 in | Wt 252.8 lb

## 2014-05-02 DIAGNOSIS — E119 Type 2 diabetes mellitus without complications: Secondary | ICD-10-CM

## 2014-05-02 DIAGNOSIS — M25561 Pain in right knee: Secondary | ICD-10-CM

## 2014-05-02 DIAGNOSIS — E785 Hyperlipidemia, unspecified: Secondary | ICD-10-CM

## 2014-05-02 DIAGNOSIS — I10 Essential (primary) hypertension: Secondary | ICD-10-CM

## 2014-05-02 DIAGNOSIS — E1169 Type 2 diabetes mellitus with other specified complication: Secondary | ICD-10-CM

## 2014-05-02 DIAGNOSIS — E669 Obesity, unspecified: Secondary | ICD-10-CM

## 2014-05-02 DIAGNOSIS — I471 Supraventricular tachycardia: Secondary | ICD-10-CM

## 2014-05-02 MED ORDER — GLIPIZIDE ER 5 MG PO TB24: 5.0000 mg | ORAL_TABLET | Freq: Every day | ORAL | Status: DC

## 2014-05-02 NOTE — Progress Notes (Signed)
   Subjective:    Patient ID: [name], female    DOB: 1971-05-03, 43 y.o.   MRN: [id number]  HPI The PT is here for follow up and re-evaluation of chronic medical conditions, medication management and review of any available recent lab and radiology data.  Preventive health is updated, specifically  Cancer screening and Immunization.   Questions or concerns regarding consultations or procedures which the PT has had in the interim are  addressed. The PT denies any adverse reactions to current medications since the last visit.  1 week h/o acute right knee pain with limited mobility after twisting movement while wearing high heels Has continued with excellent weight loss as she controls food intake, needs to re commit to execise, states she has "slacked off" Denies polyuria, polydipsia, blurred vision , or hypoglycemic episodes.     Review of Systems   See HPI Denies recent fever or chills. Denies sinus pressure, nasal congestion, ear pain or sore throat. Denies chest congestion, productive cough or wheezing. Denies chest pains, palpitations and leg swelling Denies abdominal pain, nausea, vomiting,diarrhea or constipation.   Denies dysuria, frequency, hesitancy or incontinence.  Denies headaches, seizures, numbness, or tingling. Denies depression, anxiety or insomnia. Denies skin break down or rash.     Objective:   Physical Exam  BP 96/70 mmHg  Pulse 82  Resp 16  Ht 5\' 11"  (6.433 m)  Wt 252 lb 12.8 oz (114.669 kg)  BMI 35.27 kg/m2  SpO2 98% Patient alert and oriented and in no cardiopulmonary distress.  HEENT: No facial asymmetry, EOMI,   oropharynx pink and moist.  Neck supple no JVD, no mass.  Chest: Clear to auscultation bilaterally.  CVS: S1, S2 no murmurs, no S3.Regular rate.  ABD: Soft non tender.   Ext: No edema  MS: Adequate ROM spine, shoulders, hips and reduced in right knee.  Skin: Intact, no ulcerations or rash noted.  Psych: Good eye  contact, normal affect. Memory intact not anxious or depressed appearing.  CNS: CN 2-12 intact, power,  normal throughout.no focal deficits noted.       Assessment & Plan:  Diabetes mellitus type 2 in obese Over corrected with successful lifestyle change Reduce glipizde dose Patient advised to reduce carb and sweets, commit to regular physical activity, take meds as prescribed, test blood as directed, and attempt to lose weight, to improve blood sugar control. Updated lab needed at/ before next visit.   Hyperlipidemia LDL goal <100 Controlled, no change in medication Hyperlipidemia:Low fat diet discussed and encouraged.  Pt encouraged to commit to more regular exercise to improve HDL  Obesity (BMI 30-39.9) Improved. Pt applauded on succesful weight loss through lifestyle change, and encouraged to continue same. Weight loss goal set for the next several months.   Paroxysmal supraventricular tachycardia Rate controlled following ablation Followed by cardiology  Essential hypertension, benign Controlled, systolic somewhat low but pt denies light headedness,and due to her heart disease, will not change any medicatons , and will leave cardiology to manage, as weight loss continues , and with exercise resumption may need lower med dose  Knee pain, acute Acute pain related to sudden motion and improper footwear, excessively high heels Pt education done and she will use tylenol for pain Call basck if pain worsens or mobility is limited after next 1 too 2 weeks

## 2014-05-02 NOTE — Patient Instructions (Addendum)
F/u in 4 month, call if you need me before  CONGRATS , keep it up!  Fasting lipid, cmp and EGFR, HBA1c in 4 month  Stop glipizide 88m , start glipizide 5 mg one daily and continue metformin as before  It is important that you exercise regularly at least 30 minutes 5 times a week. If you develop chest pain, have severe difficulty breathing, or feel very tired, stop exercising immediately and seek medical attention

## 2014-05-05 DIAGNOSIS — M25569 Pain in unspecified knee: Secondary | ICD-10-CM | POA: Insufficient documentation

## 2014-05-05 NOTE — Assessment & Plan Note (Signed)
Over corrected with successful lifestyle change Reduce glipizde dose Patient advised to reduce carb and sweets, commit to regular physical activity, take meds as prescribed, test blood as directed, and attempt to lose weight, to improve blood sugar control. Updated lab needed at/ before next visit.

## 2014-05-05 NOTE — Assessment & Plan Note (Signed)
Rate controlled following ablation Followed by cardiology

## 2014-05-05 NOTE — Assessment & Plan Note (Signed)
Acute pain related to sudden motion and improper footwear, excessively high heels Pt education done and she will use tylenol for pain Call basck if pain worsens or mobility is limited after next 1 too 2 weeks

## 2014-05-05 NOTE — Assessment & Plan Note (Signed)
Improved. Pt applauded on succesful weight loss through lifestyle change, and encouraged to continue same. Weight loss goal set for the next several months.  

## 2014-05-05 NOTE — Assessment & Plan Note (Signed)
Controlled, no change in medication Hyperlipidemia:Low fat diet discussed and encouraged.  Pt encouraged to commit to more regular exercise to improve HDL

## 2014-05-05 NOTE — Assessment & Plan Note (Signed)
Controlled, systolic somewhat low but pt denies light headedness,and due to her heart disease, will not change any medicatons , and will leave cardiology to manage, as weight loss continues , and with exercise resumption may need lower med dose

## 2014-05-31 ENCOUNTER — Encounter (HOSPITAL_COMMUNITY): Payer: Self-pay | Admitting: Internal Medicine

## 2014-08-02 ENCOUNTER — Other Ambulatory Visit: Payer: Self-pay | Admitting: Family Medicine

## 2014-08-27 ENCOUNTER — Other Ambulatory Visit: Payer: Self-pay | Admitting: Family Medicine

## 2014-08-27 DIAGNOSIS — Z1231 Encounter for screening mammogram for malignant neoplasm of breast: Secondary | ICD-10-CM

## 2014-09-03 ENCOUNTER — Ambulatory Visit (HOSPITAL_COMMUNITY)
Admission: RE | Admit: 2014-09-03 | Discharge: 2014-09-03 | Disposition: A | Discharge status: Home / Self Care | Payer: 59 | Source: Ambulatory Visit | Attending: Family Medicine | Admitting: Family Medicine

## 2014-09-03 DIAGNOSIS — Z1231 Encounter for screening mammogram for malignant neoplasm of breast: Secondary | ICD-10-CM | POA: Diagnosis present

## 2014-09-04 LAB — COMPLETE METABOLIC PANEL WITH GFR
ALT: 10 U/L (ref 0–35)
AST: 13 U/L (ref 0–37)
Albumin: 3.8 g/dL (ref 3.5–5.2)
Alkaline Phosphatase: 44 U/L (ref 39–117)
BUN: 14 mg/dL (ref 6–23)
CALCIUM: 9.1 mg/dL (ref 8.4–10.5)
CHLORIDE: 102 meq/L (ref 96–112)
CO2: 29 mEq/L (ref 19–32)
CREATININE: 0.81 mg/dL (ref 0.50–1.10)
GFR, Est African American: >89 mL/min
GFR, Est Non African American: 89 mL/min
GLUCOSE: 78 mg/dL (ref 70–99)
Potassium: 4 mEq/L (ref 3.5–5.3)
Sodium: 139 mEq/L (ref 135–145)
Total Bilirubin: 0.3 mg/dL (ref 0.2–1.2)
Total Protein: 6.8 g/dL (ref 6.0–8.3)

## 2014-09-04 LAB — LIPID PANEL
CHOL/HDL RATIO: 2.9 ratio
CHOLESTEROL: 153 mg/dL (ref 0–200)
HDL: 52 mg/dL (ref >=46)
LDL Cholesterol: 81 mg/dL (ref 0–99)
Triglycerides: 99 mg/dL (ref <150)
VLDL: 20 mg/dL (ref 0–40)

## 2014-09-04 LAB — HEMOGLOBIN A1C
HEMOGLOBIN A1C: 5.9 % — AB (ref <5.7)
Mean Plasma Glucose: 123 mg/dL — ABNORMAL HIGH (ref <117)

## 2014-09-05 ENCOUNTER — Ambulatory Visit (INDEPENDENT_AMBULATORY_CARE_PROVIDER_SITE_OTHER): Payer: 59 | Admitting: Family Medicine

## 2014-09-05 ENCOUNTER — Encounter: Payer: Self-pay | Admitting: Family Medicine

## 2014-09-05 VITALS — BP 104/66 | HR 62 | Resp 18 | Ht 73.0 in | Wt 243.1 lb

## 2014-09-05 DIAGNOSIS — E119 Type 2 diabetes mellitus without complications: Secondary | ICD-10-CM

## 2014-09-05 DIAGNOSIS — Z1159 Encounter for screening for other viral diseases: Secondary | ICD-10-CM

## 2014-09-05 DIAGNOSIS — E785 Hyperlipidemia, unspecified: Secondary | ICD-10-CM

## 2014-09-05 DIAGNOSIS — I10 Essential (primary) hypertension: Secondary | ICD-10-CM

## 2014-09-05 DIAGNOSIS — J3089 Other allergic rhinitis: Secondary | ICD-10-CM

## 2014-09-05 DIAGNOSIS — E669 Obesity, unspecified: Secondary | ICD-10-CM

## 2014-09-05 DIAGNOSIS — Z87891 Personal history of nicotine dependence: Secondary | ICD-10-CM

## 2014-09-05 DIAGNOSIS — R911 Solitary pulmonary nodule: Secondary | ICD-10-CM

## 2014-09-05 DIAGNOSIS — E1169 Type 2 diabetes mellitus with other specified complication: Secondary | ICD-10-CM

## 2014-09-05 MED ORDER — METFORMIN HCL ER 500 MG PO TB24: ORAL_TABLET | ORAL | Status: DC

## 2014-09-05 NOTE — Assessment & Plan Note (Signed)
Stable , no episodes of tachycardia or lit headedness. F/u with cardiology is as needed per record review and pt reporting

## 2014-09-05 NOTE — Assessment & Plan Note (Signed)
Controlled, no change in medication DASH diet and commitment to daily physical activity for a minimum of 30 minutes discussed and encouraged, as a part of hypertension management. The importance of attaining a healthy weight is also discussed.  

## 2014-09-05 NOTE — Progress Notes (Signed)
   Subjective:    Patient ID: [name], female    DOB: [birth year], 44 y.o.   MRN: [id number]  HPI The PT is here for follow up and re-evaluation of chronic medical conditions, medication management and review of any available recent lab and radiology data.  Preventive health is updated, specifically  Cancer screening and Immunization.   Questions or concerns regarding consultations or procedures which the PT has had in the interim are  addressed. The PT denies any adverse reactions to current medications since the last visit.  There are no new concerns.  There are no specific complaints  Denies polyuria, polydipsia, blurred vision , or hypoglycemic episodes.       Review of Systems See HPI Denies recent fever or chills. Denies sinus pressure, nasal congestion, ear pain or sore throat. Denies chest congestion, productive cough or wheezing. Denies chest pains, palpitations and leg swelling Denies abdominal pain, nausea, vomiting,diarrhea or constipation.   Denies dysuria, frequency, hesitancy or incontinence. Denies joint pain, swelling and limitation in mobility. Denies headaches, seizures, numbness, or tingling. Denies depression, anxiety or insomnia. Denies skin break down or rash.        Objective:   Physical Exam BP 104/66 mmHg  Pulse 62  Resp 18  Ht 6\' 1"  (1.854 m)  Wt 243 lb 1.9 oz (110.279 kg)  BMI 32.08 kg/m2  SpO2 96%  LMP 08/21/2014 Patient alert and oriented and in no cardiopulmonary distress.  HEENT: No facial asymmetry, EOMI,   oropharynx pink and moist.  Neck supple no JVD, no mass.  Chest: Clear to auscultation bilaterally.  CVS: S1, S2 no murmurs, no S3.Regular rate.  ABD: Soft non tender.   Ext: No edema  MS: Adequate ROM spine, shoulders, hips and knees.  Skin: Intact, no ulcerations or rash noted.  Psych: Good eye contact, normal affect. Memory intact not anxious or depressed appearing.  CNS: CN 2-12 intact, power,  normal  throughout.no focal deficits noted.        Assessment & Plan:  SUPRAVENTRICULAR TACHYCARDIA Stable , no episodes of tachycardia or lit headedness. F/u with cardiology is as needed per record review and pt reporting   Diabetes mellitus type 2 in obese Controlled , d/c glipizide and increase metformin dose Patient advised to reduce carb and sweets, commit to regular physical activity, take meds as prescribed, test blood as directed, and attempt to lose weight, to improve blood sugar control.    Hyperlipidemia LDL goal <100 Controlled, no change in medication Hyperlipidemia:Low fat diet discussed and encouraged.     Obesity (BMI 30-39.9) Improved. Pt applauded on succesful weight loss through lifestyle change, and encouraged to continue same. Weight loss goal set for the next several months.    Allergic rhinitis Increased symptoms with season change, start daily flonase   Essential hypertension, benign Controlled, no change in medication DASH diet and commitment to daily physical activity for a minimum of 30 minutes discussed and encouraged, as a part of hypertension management. The importance of attaining a healthy weight is also discussed.    Solitary lung nodule Needs re imaging , will order rept scan, first noted in 2007 and pot has h/o nicotine use

## 2014-09-05 NOTE — Patient Instructions (Addendum)
F/u in 4 month ,with rectal , call if you need me before  CONGRATS, keep doin the good you are , and start watching you favorite tV show for 30 mins on the treadmill  Stop glipizde, increase metformin to TWO 500 mg tabs once daily  Non fasting chem 7 and EGFr, HBa1C, and HIV, TSH, microalb, CBC in 4 months  Thanks for choosing [facility], we consider it a privelige to serve you.

## 2014-09-05 NOTE — Assessment & Plan Note (Signed)
Controlled , d/c glipizide and increase metformin dose Patient advised to reduce carb and sweets, commit to regular physical activity, take meds as prescribed, test blood as directed, and attempt to lose weight, to improve blood sugar control.

## 2014-09-05 NOTE — Assessment & Plan Note (Signed)
Increased symptoms with season change, start daily flonase

## 2014-09-05 NOTE — Assessment & Plan Note (Signed)
Needs re imaging , will order rept scan, first noted in 2007 and pot has h/o nicotine use

## 2014-09-05 NOTE — Assessment & Plan Note (Signed)
Improved. Pt applauded on succesful weight loss through lifestyle change, and encouraged to continue same. Weight loss goal set for the next several months.  

## 2014-09-05 NOTE — Assessment & Plan Note (Signed)
Controlled, no change in medication Hyperlipidemia:Low fat diet discussed and encouraged.  \ 

## 2014-09-06 ENCOUNTER — Other Ambulatory Visit: Payer: Self-pay | Admitting: Family Medicine

## 2014-09-06 DIAGNOSIS — R911 Solitary pulmonary nodule: Secondary | ICD-10-CM

## 2014-09-06 DIAGNOSIS — Z87891 Personal history of nicotine dependence: Secondary | ICD-10-CM

## 2014-09-07 ENCOUNTER — Other Ambulatory Visit: Payer: Self-pay | Admitting: Family Medicine

## 2014-09-07 DIAGNOSIS — R911 Solitary pulmonary nodule: Secondary | ICD-10-CM

## 2014-09-07 DIAGNOSIS — Z87891 Personal history of nicotine dependence: Secondary | ICD-10-CM

## 2014-09-12 ENCOUNTER — Ambulatory Visit (HOSPITAL_COMMUNITY)
Admission: RE | Admit: 2014-09-12 | Discharge: 2014-09-12 | Disposition: A | Discharge status: Home / Self Care | Payer: 59 | Source: Ambulatory Visit | Attending: Family Medicine | Admitting: Family Medicine

## 2014-09-12 ENCOUNTER — Other Ambulatory Visit (HOSPITAL_COMMUNITY): Payer: 59

## 2014-09-12 DIAGNOSIS — R911 Solitary pulmonary nodule: Secondary | ICD-10-CM | POA: Diagnosis not present

## 2014-09-12 DIAGNOSIS — Z87891 Personal history of nicotine dependence: Secondary | ICD-10-CM | POA: Diagnosis not present

## 2014-09-12 MED ORDER — IOHEXOL 300 MG/ML  SOLN
80.0000 mL | Freq: Once | INTRAMUSCULAR | Status: AC | PRN
  Administered 2014-09-12: 80 mL via INTRAVENOUS

## 2014-09-13 ENCOUNTER — Encounter: Payer: Self-pay | Admitting: Family Medicine

## 2014-09-19 ENCOUNTER — Encounter (HOSPITAL_COMMUNITY): Payer: Self-pay

## 2014-09-19 ENCOUNTER — Emergency Department (HOSPITAL_COMMUNITY)
Admission: EM | Admit: 2014-09-19 | Discharge: 2014-09-19 | Disposition: A | Discharge status: Home / Self Care | Payer: 59 | Attending: Emergency Medicine | Admitting: Emergency Medicine

## 2014-09-19 ENCOUNTER — Emergency Department (HOSPITAL_COMMUNITY): Payer: 59

## 2014-09-19 DIAGNOSIS — E669 Obesity, unspecified: Secondary | ICD-10-CM | POA: Insufficient documentation

## 2014-09-19 DIAGNOSIS — M545 Low back pain: Secondary | ICD-10-CM | POA: Insufficient documentation

## 2014-09-19 DIAGNOSIS — I10 Essential (primary) hypertension: Secondary | ICD-10-CM | POA: Diagnosis not present

## 2014-09-19 DIAGNOSIS — Z720 Tobacco use: Secondary | ICD-10-CM | POA: Diagnosis not present

## 2014-09-19 DIAGNOSIS — R51 Headache: Secondary | ICD-10-CM | POA: Diagnosis not present

## 2014-09-19 DIAGNOSIS — M5136 Other intervertebral disc degeneration, lumbar region: Secondary | ICD-10-CM | POA: Insufficient documentation

## 2014-09-19 DIAGNOSIS — E119 Type 2 diabetes mellitus without complications: Secondary | ICD-10-CM | POA: Diagnosis not present

## 2014-09-19 DIAGNOSIS — Z8701 Personal history of pneumonia (recurrent): Secondary | ICD-10-CM | POA: Insufficient documentation

## 2014-09-19 DIAGNOSIS — Z79899 Other long term (current) drug therapy: Secondary | ICD-10-CM | POA: Insufficient documentation

## 2014-09-19 DIAGNOSIS — M79605 Pain in left leg: Secondary | ICD-10-CM

## 2014-09-19 DIAGNOSIS — Z7951 Long term (current) use of inhaled steroids: Secondary | ICD-10-CM | POA: Insufficient documentation

## 2014-09-19 MED ORDER — HYDROCODONE-ACETAMINOPHEN 5-325 MG PO TABS: 1.0000 | ORAL_TABLET | ORAL | Status: DC | PRN

## 2014-09-19 MED ORDER — CELECOXIB 100 MG PO CAPS: 100.0000 mg | ORAL_CAPSULE | Freq: Two times a day (BID) | ORAL | Status: DC

## 2014-09-19 MED ORDER — MORPHINE SULFATE 4 MG/ML IJ SOLN
8.0000 mg | Freq: Once | INTRAMUSCULAR | Status: AC
  Administered 2014-09-19: 8 mg via INTRAMUSCULAR
  Filled 2014-09-19 (×2): qty 2

## 2014-09-19 MED ORDER — ONDANSETRON HCL 4 MG PO TABS
4.0000 mg | ORAL_TABLET | Freq: Once | ORAL | Status: AC
  Administered 2014-09-19: 4 mg via ORAL
  Filled 2014-09-19: qty 1

## 2014-09-19 MED ORDER — METHOCARBAMOL 500 MG PO TABS: 500.0000 mg | ORAL_TABLET | Freq: Three times a day (TID) | ORAL | Status: DC

## 2014-09-19 NOTE — ED Provider Notes (Signed)
CSN: [id number]     Arrival date & time 09/19/14  0912 History   First MD Initiated Contact with Patient 09/19/14 [phone]     Chief Complaint  Patient presents with  . Back Pain     (Consider location/radiation/quality/duration/timing/severity/associated sxs/prior Treatment) Patient is a 44 y.o. female presenting with back pain. The history is provided by the patient.  Back Pain Location:  Lumbar spine Quality:  Aching and shooting Radiates to:  L thigh Pain severity:  Severe Pain is:  Same all the time Onset quality:  Gradual Timing:  Intermittent Progression:  Worsening Chronicity: acute on chronic. Context comment:  Hx of spine disease. Associated symptoms: headaches     Past Medical History  Diagnosis Date  . Essential hypertension, benign   . Supraventricular tachycardia   . Weight loss     Hopitalized 4 years ago after loosing 200lb on a supervised program , malnurished with severe protein deficiency   . Pneumonia 2007  . Obesity     Since childhood   . Migraine headache 2007  . Spinal disease   . Type 2 diabetes mellitus    Past Surgical History  Procedure Laterality Date  . Tubal ligation  1996  . Ablation  12-28-2013    slow pathway modification of AVNRT by Dr Lovena Le  . Electrophysiology study N/A 12/28/2013    Procedure: ELECTROPHYSIOLOGY STUDY;  Surgeon: Evans Lance, MD;  Location: [facility] CATH LAB;  Service: Cardiovascular;  Laterality: N/A;  . Supraventricular tachycardia ablation N/A 12/28/2013    Procedure: SUPRAVENTRICULAR TACHYCARDIA ABLATION;  Surgeon: Evans Lance, MD;  Location: [facility] CATH LAB;  Service: Cardiovascular;  Laterality: N/A;   Family History  Problem Relation Age of Onset  . Hypertension Mother   . Diabetes Mother   . Cancer Mother     Uterine   . Heart failure Mother     CABG  . Cancer Father 35    Prostate   . Hypertension Father   . Bradycardia Father     Irregular heart beat   . Diabetes Brother   . Diabetes Brother   .  Hypertension Brother   . Hypertension Brother    History  Substance Use Topics  . Smoking status: Current Every Day Smoker -- 0.50 packs/day for 7 years    Types: Cigarettes    Last Attempt to Quit: 08/05/2012  . Smokeless tobacco: Never Used  . Alcohol Use: No   OB History    No data available     Review of Systems  Musculoskeletal: Positive for back pain.  Neurological: Positive for headaches.  All other systems reviewed and are negative.     Allergies  Codeine and Metoclopramide hcl  Home Medications   Prior to Admission medications   Medication Sig Start Date End Date Taking? Authorizing Provider  acetaminophen (TYLENOL) 500 MG tablet Take 1,000 mg by mouth every 6 (six) hours as needed (for pain).     Historical Provider, MD  fluticasone (FLONASE) 50 MCG/ACT nasal spray Place 2 sprays into both nostrils daily. 09/14/13 09/14/14  Fayrene Helper, MD  losartan-hydrochlorothiazide (HYZAAR) 100-25 MG per tablet Take 1 tablet by mouth daily. 12/20/13 12/20/14  Fayrene Helper, MD  metFORMIN (GLUCOPHAGE XR) 500 MG 24 hr tablet Two tablets once daily 09/05/14   Fayrene Helper, MD  rosuvastatin (CRESTOR) 5 MG tablet Take 1 tablet (5 mg total) by mouth at bedtime. 10/25/13   Fayrene Helper, MD   BP 114/66 mmHg  Pulse  74  Temp(Src) 98.4 F (36.9 C) (Oral)  Resp 22  Ht 5\' 11"  (1.803 m)  Wt 241 lb (109.317 kg)  BMI 33.63 kg/m2  SpO2 98%  LMP 09/16/2014 Physical Exam  Constitutional: She is oriented to person, place, and time. She appears well-developed and well-nourished.  Non-toxic appearance.  HENT:  Head: Normocephalic.  Right Ear: Tympanic membrane and external ear normal.  Left Ear: Tympanic membrane and external ear normal.  Eyes: EOM and lids are normal. Pupils are equal, round, and reactive to light.  Neck: Normal range of motion. Neck supple. Carotid bruit is not present.  Cardiovascular: Normal rate, regular rhythm, normal heart sounds, intact distal  pulses and normal pulses.   Pulmonary/Chest: Breath sounds normal. No respiratory distress.  Abdominal: Soft. Bowel sounds are normal. There is no tenderness. There is no guarding.  Musculoskeletal:       Lumbar back: She exhibits decreased range of motion, tenderness, pain and spasm.       Back:       Arms: Lymphadenopathy:       Head (right side): No submandibular adenopathy present.       Head (left side): No submandibular adenopathy present.    She has no cervical adenopathy.  Neurological: She is alert and oriented to person, place, and time. She has normal strength. No cranial nerve deficit or sensory deficit.  Skin: Skin is warm and dry.  Psychiatric: She has a normal mood and affect. Her speech is normal.  Nursing note and vitals reviewed.   ED Course  Procedures (including critical care time) Labs Review Labs Reviewed - No data to display  Imaging Review No results found.   EKG Interpretation None      MDM  No gross neurologic deficits appreciated on today's examination. Exam is consistent with low back pain and degenerative disc disease.  Plan at this time is for the patient received Celebrex and Robaxin. Patient is to follow with primary physician.    Final diagnoses:  Low back pain radiating to left leg  DDD (degenerative disc disease), lumbar    **I have reviewed nursing notes, vital signs, and all appropriate lab and imaging results for this patient.Lily Kocher, PA-C 09/19/14 1637  Ripley Fraise, MD 09/20/14 [phone]

## 2014-09-19 NOTE — Discharge Instructions (Signed)
Your xrays reveal degenerative disc disease of the lumbar spine, and arthritis of the bones in the pelvis. Heating pad to the lower back may be helpful. Please see Dr Moshe Cipro as soon as possible for evaluation and management and appropriate referral. Rx for robaxin and norco have been ordered. These may cause drowsiness, use with caution. Do not drive or operate machines while taking this medication. Back Pain, Adult Low back pain is very common. About 1 in 5 people have back pain.The cause of low back pain is rarely dangerous. The pain often gets better over time.About half of people with a sudden onset of back pain feel better in just 2 weeks. About 8 in 10 people feel better by 6 weeks.  CAUSES Some common causes of back pain include:  Strain of the muscles or ligaments supporting the spine.  Wear and tear (degeneration) of the spinal discs.  Arthritis.  Direct injury to the back. DIAGNOSIS Most of the time, the direct cause of low back pain is not known.However, back pain can be treated effectively even when the exact cause of the pain is unknown.Answering your caregiver's questions about your overall health and symptoms is one of the most accurate ways to make sure the cause of your pain is not dangerous. If your caregiver needs more information, he or she may order lab work or imaging tests (X-rays or MRIs).However, even if imaging tests show changes in your back, this usually does not require surgery. HOME CARE INSTRUCTIONS For many people, back pain returns.Since low back pain is rarely dangerous, it is often a condition that people can learn to [facility] their own.   Remain active. It is stressful on the back to sit or stand in one place. Do not sit, drive, or stand in one place for more than 30 minutes at a time. Take short walks on level surfaces as soon as pain allows.Try to increase the length of time you walk each day.  Do not stay in bed.Resting more than 1 or 2 days can  delay your recovery.  Do not avoid exercise or work.Your body is made to move.It is not dangerous to be active, even though your back may hurt.Your back will likely heal faster if you return to being active before your pain is gone.  Pay attention to your body when you bend and lift. Many people have less discomfortwhen lifting if they bend their knees, keep the load close to their bodies,and avoid twisting. Often, the most comfortable positions are those that put less stress on your recovering back.  Find a comfortable position to sleep. Use a firm mattress and lie on your side with your knees slightly bent. If you lie on your back, put a pillow under your knees.  Only take over-the-counter or prescription medicines as directed by your caregiver. Over-the-counter medicines to reduce pain and inflammation are often the most helpful.Your caregiver may prescribe muscle relaxant drugs.These medicines help dull your pain so you can more quickly return to your normal activities and healthy exercise.  Put ice on the injured area.  Put ice in a plastic bag.  Place a towel between your skin and the bag.  Leave the ice on for 15-20 minutes, 03-04 times a day for the first 2 to 3 days. After that, ice and heat may be alternated to reduce pain and spasms.  Ask your caregiver about trying back exercises and gentle massage. This may be of some benefit.  Avoid feeling anxious or stressed.Stress increases  muscle tension and can worsen back pain.It is important to recognize when you are anxious or stressed and learn ways to manage it.Exercise is a great option. SEEK MEDICAL CARE IF:  You have pain that is not relieved with rest or medicine.  You have pain that does not improve in 1 week.  You have new symptoms.  You are generally not feeling well. SEEK IMMEDIATE MEDICAL CARE IF:   You have pain that radiates from your back into your legs.  You develop new bowel or bladder control  problems.  You have unusual weakness or numbness in your arms or legs.  You develop nausea or vomiting.  You develop abdominal pain.  You feel faint. Document Released: 06/08/2005 Document Revised: 12/08/2011 Document Reviewed: 10/10/2013 [facility] Patient Information 2015 [place], [place]. This information is not intended to replace advice given to you by your health care provider. Make sure you discuss any questions you have with your health care provider.

## 2014-09-19 NOTE — ED Notes (Signed)
Pt reports pain in lower back that radiates down left leg.  Denies injury.  Reports history of lower back pain.

## 2014-09-19 NOTE — ED Notes (Signed)
Pt verbalized understanding of no driving and to use caution within 4 hours of taking pain meds due to meds cause drowsiness 

## 2014-09-20 ENCOUNTER — Ambulatory Visit (INDEPENDENT_AMBULATORY_CARE_PROVIDER_SITE_OTHER): Payer: 59 | Admitting: Family Medicine

## 2014-09-20 ENCOUNTER — Encounter: Payer: Self-pay | Admitting: Family Medicine

## 2014-09-20 VITALS — BP 112/72 | HR 68 | Resp 18 | Ht 73.0 in | Wt 243.0 lb

## 2014-09-20 DIAGNOSIS — M5430 Sciatica, unspecified side: Secondary | ICD-10-CM | POA: Insufficient documentation

## 2014-09-20 DIAGNOSIS — I10 Essential (primary) hypertension: Secondary | ICD-10-CM

## 2014-09-20 DIAGNOSIS — M5440 Lumbago with sciatica, unspecified side: Secondary | ICD-10-CM | POA: Diagnosis not present

## 2014-09-20 DIAGNOSIS — M5441 Lumbago with sciatica, right side: Secondary | ICD-10-CM | POA: Diagnosis not present

## 2014-09-20 DIAGNOSIS — M549 Dorsalgia, unspecified: Secondary | ICD-10-CM | POA: Insufficient documentation

## 2014-09-20 DIAGNOSIS — M5442 Lumbago with sciatica, left side: Secondary | ICD-10-CM | POA: Diagnosis not present

## 2014-09-20 MED ORDER — METHYLPREDNISOLONE ACETATE 80 MG/ML IJ SUSP
80.0000 mg | Freq: Once | INTRAMUSCULAR | Status: AC
  Administered 2014-09-20: 80 mg via INTRAMUSCULAR

## 2014-09-20 MED ORDER — GABAPENTIN 300 MG PO CAPS: ORAL_CAPSULE | ORAL | Status: DC

## 2014-09-20 MED ORDER — PREDNISONE (PAK) 5 MG PO TABS: 5.0000 mg | ORAL_TABLET | ORAL | Status: DC

## 2014-09-20 NOTE — Assessment & Plan Note (Signed)
Pt woke up on 03/30 with ten plus  Low back pain radiating to anterior left thigh to knee, no associated weakness and numbness, and no incontinence Was in ED yesterday, now a 3 still recovering from morphine

## 2014-09-20 NOTE — Patient Instructions (Signed)
F/u as before , call if you need me sooner  Depo medrol in office today , then start prednisone tomorrow once you collect this  Back and left thigh pain is from arthritis and disc disease.  New for use if needed on a daily basis is gabapentin one at bedtime For the next 2 days use th hydrocodone at bedtime, then after start the gabapentin, not addictive , and less sedating Work excuse from 03/30 to return on 09/23/2014 as per ED recommendation

## 2014-09-21 ENCOUNTER — Ambulatory Visit: Payer: 59 | Admitting: Family Medicine

## 2014-09-22 NOTE — Progress Notes (Signed)
   Subjective:    Patient ID: [name], female    DOB: [birth year], 44 y.o.   MRN: [id number]  HPI Awoke 1 day ago with severe low back pain radiating down anterior left  Thigh to knee, no specific aggravating factor. Treated 1 day ago in ED , much better, but off for next 5 days No lower ext weakness or numbness or incontinence Prior to this has been doing well No prior episode of this kind   Review of Systems See HPI Denies polyuria, polydipsia, blurred vision , or hypoglycemic episodes.     Objective:   Physical Exam BP 112/72 mmHg  Pulse 68  Resp 18  Ht 6\' 1"  (1.854 m)  Wt 243 lb (110.224 kg)  BMI 32.07 kg/m2  SpO2 99%  LMP 09/16/2014 Patient alert and oriented and in no cardiopulmonary distress.Drowsy from medication recently received ni [facility]  HEENT: No facial asymmetry, EOMI,   oropharynx pink and moist.  Neck supple no JVD, no mass.  Chest: Clear to auscultation bilaterally.  CVS: S1, S2 no murmurs, no S3.Regular rate.  ABD: Soft non tender.   Ext: No edema  MS: Decreased  ROM lumbar  Spine, adequate in  shoulders, hips and knees.  .  .  CNS: CN 2-12 intact, power,  normal throughout.no focal deficits noted.        Assessment & Plan:  Back pain with sciatica Pt woke up on 03/30 with ten plus  Low back pain radiating to anterior left thigh to knee, no associated weakness and numbness, and no incontinence Was in ED yesterday, now a 3 still recovering from morphine   Essential hypertension, benign Controlled, no change in medication

## 2014-09-22 NOTE — Assessment & Plan Note (Signed)
Controlled, no change in medication  

## 2014-12-12 ENCOUNTER — Encounter: Payer: Self-pay | Admitting: Family Medicine

## 2014-12-13 ENCOUNTER — Other Ambulatory Visit: Payer: Self-pay

## 2014-12-13 MED ORDER — TEMAZEPAM 30 MG PO CAPS: 30.0000 mg | ORAL_CAPSULE | Freq: Every evening | ORAL | Status: DC | PRN

## 2014-12-17 ENCOUNTER — Other Ambulatory Visit: Payer: Self-pay

## 2014-12-20 ENCOUNTER — Other Ambulatory Visit: Payer: Self-pay | Admitting: Family Medicine

## 2015-01-04 LAB — MICROALBUMIN / CREATININE URINE RATIO
CREATININE, URINE: 108.9 mg/dL
Microalb Creat Ratio: 2.8 mg/g (ref 0.0–30.0)
Microalb, Ur: 0.3 mg/dL (ref <2.0)

## 2015-01-04 LAB — COMPLETE METABOLIC PANEL WITH GFR
ALBUMIN: 4.1 g/dL (ref 3.5–5.2)
ALK PHOS: 44 U/L (ref 39–117)
ALT: 9 U/L (ref 0–35)
AST: 11 U/L (ref 0–37)
BUN: 15 mg/dL (ref 6–23)
CALCIUM: 9.5 mg/dL (ref 8.4–10.5)
CO2: 28 mEq/L (ref 19–32)
Chloride: 100 mEq/L (ref 96–112)
Creat: 0.79 mg/dL (ref 0.50–1.10)
GFR, Est African American: >89 mL/min
GLUCOSE: 93 mg/dL (ref 70–99)
POTASSIUM: 3.4 meq/L — AB (ref 3.5–5.3)
Sodium: 141 mEq/L (ref 135–145)
Total Bilirubin: 0.3 mg/dL (ref 0.2–1.2)
Total Protein: 7.3 g/dL (ref 6.0–8.3)

## 2015-01-04 LAB — HEMOGLOBIN A1C
Hgb A1c MFr Bld: 6.1 % — ABNORMAL HIGH (ref <5.7)
MEAN PLASMA GLUCOSE: 128 mg/dL — AB (ref <117)

## 2015-01-04 LAB — HIV ANTIBODY (ROUTINE TESTING W REFLEX): HIV 1&2 Ab, 4th Generation: NONREACTIVE

## 2015-01-04 LAB — TSH: TSH: 1.148 u[IU]/mL (ref 0.350–4.500)

## 2015-01-07 ENCOUNTER — Ambulatory Visit (INDEPENDENT_AMBULATORY_CARE_PROVIDER_SITE_OTHER): Payer: 59 | Admitting: Family Medicine

## 2015-01-07 ENCOUNTER — Encounter: Payer: Self-pay | Admitting: Family Medicine

## 2015-01-07 ENCOUNTER — Other Ambulatory Visit: Payer: Self-pay | Admitting: Family Medicine

## 2015-01-07 VITALS — BP 104/70 | HR 78 | Resp 16 | Ht 71.0 in | Wt 226.0 lb

## 2015-01-07 DIAGNOSIS — E669 Obesity, unspecified: Secondary | ICD-10-CM

## 2015-01-07 DIAGNOSIS — E785 Hyperlipidemia, unspecified: Secondary | ICD-10-CM

## 2015-01-07 DIAGNOSIS — I10 Essential (primary) hypertension: Secondary | ICD-10-CM | POA: Diagnosis not present

## 2015-01-07 DIAGNOSIS — E119 Type 2 diabetes mellitus without complications: Secondary | ICD-10-CM

## 2015-01-07 DIAGNOSIS — J309 Allergic rhinitis, unspecified: Secondary | ICD-10-CM

## 2015-01-07 DIAGNOSIS — F4321 Adjustment disorder with depressed mood: Secondary | ICD-10-CM | POA: Diagnosis not present

## 2015-01-07 DIAGNOSIS — E1169 Type 2 diabetes mellitus with other specified complication: Secondary | ICD-10-CM

## 2015-01-07 MED ORDER — FLUOXETINE HCL (PMDD) 10 MG PO CAPS: ORAL_CAPSULE | ORAL | Status: DC

## 2015-01-07 NOTE — Patient Instructions (Signed)
F/u in 8 weeks, call if you need me before  New to help with depression, and grief short term is fluoxetine, one daily  PLEASE call employee health for counseling  There is also a grief sharing /counselling group at [facility] , I have only heard very positive feedback about the group  Labs are excellent Pls resume crestor

## 2015-01-15 NOTE — Assessment & Plan Note (Signed)
Controlled, no change in medication  

## 2015-01-15 NOTE — Progress Notes (Signed)
[name]     MRN: [id number]      DOB: January 16, 1971   HPI [name] is here for follow up and re-evaluation of chronic medical conditions, medication management and review of any available recent lab and radiology data.  Preventive health is updated, specifically  Cancer screening and Immunization.   The PT denies any adverse reactions to current medications since the last visit.  Unfortunately, she unexpectedly lost her spouse last month and is appropriately grieving No regular exercise currently and reduced level of functioning currently, as she is appropriately grieving  ROS Denies recent fever or chills. Denies sinus pressure, nasal congestion, ear pain or sore throat. Denies chest congestion, productive cough or wheezing. Denies chest pains, palpitations and leg swelling Denies abdominal pain, nausea, vomiting,diarrhea or constipation.   Denies dysuria, frequency, hesitancy or incontinence. Denies joint pain, swelling and limitation in mobility. Denies headaches, seizures, numbness, or tingling.  Denies skin break down or rash.   PE  BP 104/70 mmHg  Pulse 78  Resp 16  Ht 5\' 11"  (1.803 m)  Wt 226 lb (102.513 kg)  BMI 31.53 kg/m2  SpO2 99%  Patient alert and oriented and in no cardiopulmonary distress.  HEENT: No facial asymmetry, EOMI,   oropharynx pink and moist.  Neck supple no JVD, no mass.  Chest: Clear to auscultation bilaterally.  CVS: S1, S2 no murmurs, no S3.Regular rate.  ABD: Soft non tender.   Ext: No edema  MS: Adequate ROM spine, shoulders, hips and knees.  Skin: Intact, no ulcerations or rash noted.  Psych: Good eye contact, normal affect. Memory intact not anxious , mildly  depressed appearing.  CNS: CN 2-12 intact, power,  normal throughout.no focal deficits noted.   Assessment & Plan   Essential hypertension, benign Controlled, no change in medication DASH diet and commitment to daily physical activity for a minimum of 30  minutes discussed and encouraged, as a part of hypertension management. The importance of attaining a healthy weight is also discussed.  BP/Weight 01/07/2015 09/20/2014 09/19/2014 09/05/2014 05/02/2014 02/07/2014 7/34/1937  Systolic BP 902 409 735 329 96 82 924  Diastolic BP 70 72 54 66 70 60 68  Wt. (Lbs) 226 243 241 243.12 252.8 273 272  BMI 31.53 32.07 33.63 32.08 35.27 38.09 37.95        Diabetes mellitus type 2 in obese Controlled, no change in medication [name] is reminded of the importance of commitment to daily physical activity for 30 minutes or more, as able and the need to limit carbohydrate intake to 30 to 60 grams per meal to help with blood sugar control.   The need to take medication as prescribed, test blood sugar as directed, and to call between visits if there is a concern that blood sugar is uncontrolled is also discussed.   [name] is reminded of the importance of daily foot exam, annual eye examination, and good blood sugar, blood pressure and cholesterol control.  Diabetic Labs Latest Ref Rng 01/03/2015 09/03/2014 04/30/2014 01/22/2014 12/21/2013  HbA1c <5.7 % 6.1(H) 5.9(H) 5.7(H) 6.4(H) -  Microalbumin <2.0 mg/dL 0.3 - - - -  Micro/Creat Ratio 0.0 - 30.0 mg/g 2.8 - - - -  Chol 0 - 200 mg/dL - 153 - 88 -  HDL >=46 mg/dL - 52 - 33(L) -  Calc LDL 0 - 99 mg/dL - 81 - 34 -  Triglycerides <150 mg/dL - 99 - 105 -  Creatinine 0.50 - 1.10 mg/dL 0.79 0.81 0.92  0.85 0.9  GFR >60.00 mL/min - - - - 88.65   BP/Weight 01/07/2015 09/20/2014 09/19/2014 09/05/2014 05/02/2014 02/07/2014 0/97/3532  Systolic BP 992 426 834 196 96 82 222  Diastolic BP 70 72 54 66 70 60 68  Wt. (Lbs) 226 243 241 243.12 252.8 273 272  BMI 31.53 32.07 33.63 32.08 35.27 38.09 37.95   Foot/eye exam completion dates 01/07/2015 10/25/2013  Foot Form Completion Done Done         Obesity (BMI 30-39.9) Improved. Patient re-educated about  the importance of commitment to a  minimum of 150 minutes of  exercise per week.  The importance of healthy food choices with portion control discussed. Encouraged to start a food diary, count calories and to consider  joining a support group. Sample diet sheets offered. Goals set by the patient for the next several months.   Weight /BMI 01/07/2015 09/20/2014 09/19/2014  WEIGHT 226 lb 243 lb 241 lb  HEIGHT 5\' 11"  6\' 1"  5\' 11"   BMI 31.53 kg/m2 32.07 kg/m2 33.63 kg/m2    Current exercise per week 60 minutes.   Hyperlipidemia LDL goal <100 Hyperlipidemia:Low fat diet discussed and encouraged.   Lipid Panel  Lab Results  Component Value Date   CHOL 153 09/03/2014   HDL 52 09/03/2014   LDLCALC 81 09/03/2014   TRIG 99 09/03/2014   CHOLHDL 2.9 09/03/2014      Resume crestor  Grief Unexpected death of spouse in past month, currently appropriately grieving and depressed, recommend counselling and she will start fluoxetine short term also  Allergic rhinitis Controlled, no change in medication

## 2015-01-15 NOTE — Assessment & Plan Note (Signed)
Hyperlipidemia:Low fat diet discussed and encouraged.   Lipid Panel  Lab Results  Component Value Date   CHOL 153 09/03/2014   HDL 52 09/03/2014   LDLCALC 81 09/03/2014   TRIG 99 09/03/2014   CHOLHDL 2.9 09/03/2014      Resume crestor

## 2015-01-15 NOTE — Assessment & Plan Note (Signed)
Controlled, no change in medication [name] is reminded of the importance of commitment to daily physical activity for 30 minutes or more, as able and the need to limit carbohydrate intake to 30 to 60 grams per meal to help with blood sugar control.   The need to take medication as prescribed, test blood sugar as directed, and to call between visits if there is a concern that blood sugar is uncontrolled is also discussed.   [name] is reminded of the importance of daily foot exam, annual eye examination, and good blood sugar, blood pressure and cholesterol control.  Diabetic Labs Latest Ref Rng 01/03/2015 09/03/2014 04/30/2014 01/22/2014 12/21/2013  HbA1c <5.7 % 6.1(H) 5.9(H) 5.7(H) 6.4(H) -  Microalbumin <2.0 mg/dL 0.3 - - - -  Micro/Creat Ratio 0.0 - 30.0 mg/g 2.8 - - - -  Chol 0 - 200 mg/dL - 153 - 88 -  HDL >=46 mg/dL - 52 - 33(L) -  Calc LDL 0 - 99 mg/dL - 81 - 34 -  Triglycerides <150 mg/dL - 99 - 105 -  Creatinine 0.50 - 1.10 mg/dL 0.79 0.81 0.92 0.85 0.9  GFR >60.00 mL/min - - - - 88.65   BP/Weight 01/07/2015 09/20/2014 09/19/2014 09/05/2014 05/02/2014 02/07/2014 1/61/0960  Systolic BP 454 098 119 147 96 82 829  Diastolic BP 70 72 54 66 70 60 68  Wt. (Lbs) 226 243 241 243.12 252.8 273 272  BMI 31.53 32.07 33.63 32.08 35.27 38.09 37.95   Foot/eye exam completion dates 01/07/2015 10/25/2013  Foot Form Completion Done Done

## 2015-01-15 NOTE — Assessment & Plan Note (Signed)
Improved. Patient re-educated about  the importance of commitment to a  minimum of 150 minutes of exercise per week.  The importance of healthy food choices with portion control discussed. Encouraged to start a food diary, count calories and to consider  joining a support group. Sample diet sheets offered. Goals set by the patient for the next several months.   Weight /BMI 01/07/2015 09/20/2014 09/19/2014  WEIGHT 226 lb 243 lb 241 lb  HEIGHT 5\' 11"  6\' 1"  5\' 11"   BMI 31.53 kg/m2 32.07 kg/m2 33.63 kg/m2    Current exercise per week 60 minutes.

## 2015-01-15 NOTE — Assessment & Plan Note (Signed)
Controlled, no change in medication DASH diet and commitment to daily physical activity for a minimum of 30 minutes discussed and encouraged, as a part of hypertension management. The importance of attaining a healthy weight is also discussed.  BP/Weight 01/07/2015 09/20/2014 09/19/2014 09/05/2014 05/02/2014 02/07/2014 4/74/2595  Systolic BP 638 756 433 295 96 82 188  Diastolic BP 70 72 54 66 70 60 68  Wt. (Lbs) 226 243 241 243.12 252.8 273 272  BMI 31.53 32.07 33.63 32.08 35.27 38.09 37.95

## 2015-01-15 NOTE — Assessment & Plan Note (Signed)
Unexpected death of spouse in past month, currently appropriately grieving and depressed, recommend counselling and she will start fluoxetine short term also

## 2015-02-05 ENCOUNTER — Ambulatory Visit (INDEPENDENT_AMBULATORY_CARE_PROVIDER_SITE_OTHER): Payer: 59

## 2015-02-05 ENCOUNTER — Other Ambulatory Visit: Payer: Self-pay

## 2015-02-05 DIAGNOSIS — M5440 Lumbago with sciatica, unspecified side: Secondary | ICD-10-CM

## 2015-02-05 MED ORDER — METHYLPREDNISOLONE ACETATE 80 MG/ML IJ SUSP
80.0000 mg | Freq: Once | INTRAMUSCULAR | Status: AC
  Administered 2015-02-05: 80 mg via INTRAMUSCULAR

## 2015-02-05 MED ORDER — METHOCARBAMOL 500 MG PO TABS: 500.0000 mg | ORAL_TABLET | Freq: Three times a day (TID) | ORAL | Status: DC

## 2015-02-05 MED ORDER — PREDNISONE 5 MG (21) PO TBPK: 5.0000 mg | ORAL_TABLET | Freq: Every day | ORAL | Status: DC

## 2015-02-05 NOTE — Progress Notes (Signed)
Patient in for injection for back pain.  Injection of depo medrol 80mg  given IM.  Prednisone dose pak refilled as will as robaxin requested.  Patient will call back if she has any further problems.

## 2015-02-19 LAB — HM DIABETES EYE EXAM

## 2015-03-07 ENCOUNTER — Ambulatory Visit (INDEPENDENT_AMBULATORY_CARE_PROVIDER_SITE_OTHER): Payer: 59 | Admitting: Family Medicine

## 2015-03-07 ENCOUNTER — Encounter: Payer: Self-pay | Admitting: Family Medicine

## 2015-03-07 VITALS — BP 102/66 | HR 63 | Resp 18 | Wt 220.1 lb

## 2015-03-07 DIAGNOSIS — H1032 Unspecified acute conjunctivitis, left eye: Secondary | ICD-10-CM

## 2015-03-07 DIAGNOSIS — I10 Essential (primary) hypertension: Secondary | ICD-10-CM

## 2015-03-07 DIAGNOSIS — E669 Obesity, unspecified: Secondary | ICD-10-CM

## 2015-03-07 DIAGNOSIS — E119 Type 2 diabetes mellitus without complications: Secondary | ICD-10-CM | POA: Diagnosis not present

## 2015-03-07 DIAGNOSIS — E1169 Type 2 diabetes mellitus with other specified complication: Secondary | ICD-10-CM

## 2015-03-07 DIAGNOSIS — F4321 Adjustment disorder with depressed mood: Secondary | ICD-10-CM

## 2015-03-07 MED ORDER — GENTAMICIN SULFATE 0.1 % EX CREA: TOPICAL_CREAM | CUTANEOUS | Status: DC

## 2015-03-07 NOTE — Patient Instructions (Signed)
F/u end November  Or early December, call if you need me before  Antibiotic cream sent for 1 week use if needed  Please keep hands clean and do not rub eye  HBA1C, chem 7 and EGFr for Dec visit  Thankful you are doing better  Conjunctivitis Conjunctivitis is commonly called "pink eye." Conjunctivitis can be caused by bacterial or viral infection, allergies, or injuries. There is usually redness of the lining of the eye, itching, discomfort, and sometimes discharge. There may be deposits of matter along the eyelids. A viral infection usually causes a watery discharge, while a bacterial infection causes a yellowish, thick discharge. Pink eye is very contagious and spreads by direct contact. You may be given antibiotic eyedrops as part of your treatment. Before using your eye medicine, remove all drainage from the eye by washing gently with warm water and cotton balls. Continue to use the medication until you have awakened 2 mornings in a row without discharge from the eye. Do not rub your eye. This increases the irritation and helps spread infection. Use separate towels from other household members. Wash your hands with soap and water before and after touching your eyes. Use cold compresses to reduce pain and sunglasses to relieve irritation from light. Do not wear contact lenses or wear eye makeup until the infection is gone. SEEK MEDICAL CARE IF:   Your symptoms are not better after 3 days of treatment.  You have increased pain or trouble seeing.  The outer eyelids become very red or swollen. Document Released: 07/16/2004 Document Revised: 08/31/2011 Document Reviewed: 06/08/2005 [facility] Patient Information 2015 [place], [place]. This information is not intended to replace advice given to you by your health care provider. Make sure you discuss any questions you have with your health care provider.

## 2015-03-07 NOTE — Progress Notes (Signed)
   Subjective:    Patient ID: [name], female    DOB: 02/24/1971, 44 y.o.   MRN: [id number]  HPI   [name]     MRN: [id number]      DOB: September 26, 1970   HPI [name] is here for follow up and re-evaluation of chronic medical conditions, medication management and review of any available recent lab and radiology data.  Preventive health is updated, specifically  Cancer screening and Immunization.   Has had grief counseling only through a Church member, and has found this to be sufficient. Stopped fluoxetine, did not like how it made her feel. States she is "hyper " and on the go all the time, has to find something to do now that she is on her own.   2 day h/o red draining left eye which originally felt sticky/ gritty  ROS Denies recent fever or chills. Denies sinus pressure, nasal congestion, ear pain or sore throat. Denies chest congestion, productive cough or wheezing. Denies chest pains, palpitations and leg swelling Denies abdominal pain, nausea, vomiting,diarrhea or constipation.   Denies dysuria, frequency, hesitancy or incontinence. Denies joint pain, swelling and limitation in mobility. Denies headaches, seizures, numbness, or tingling. Denies uncontrolled  depression, anxiety or insomnia. Denies skin break down or rash.   PE  BP 102/66 mmHg  Pulse 63  Resp 18  Wt 220 lb 1.9 oz (99.846 kg)  SpO2 99%  Patient alert and oriented and in no cardiopulmonary distress.  HEENT: No facial asymmetry, EOMI,   oropharynx pink and moist.  Neck supple no JVD, no mass.Mild erythema and conjunctival injection of left eye,  Drainage present , slightly thick  Chest: Clear to auscultation bilaterally.  CVS: S1, S2 no murmurs, no S3.Regular rate.  ABD: Soft non tender.   Ext: No edema  MS: Adequate ROM spine, shoulders, hips and knees.  Skin: Intact, no ulcerations or rash noted.  Psych: Good eye contact, normal affect. Memory intact not anxious or depressed  appearing.  CNS: CN 2-12 intact, power,  normal throughout.no focal deficits noted.   Assessment & Plan   Essential hypertension, benign Controlled, no change in medication DASH diet and commitment to daily physical activity for a minimum of 30 minutes discussed and encouraged, as a part of hypertension management. The importance of attaining a healthy weight is also discussed.  BP/Weight 03/07/2015 01/07/2015 09/20/2014 09/19/2014 09/05/2014 05/02/2014 1/49/7026  Systolic BP 378 588 502 774 128 96 82  Diastolic BP 66 70 72 54 66 70 60  Wt. (Lbs) 220.12 226 243 241 243.12 252.8 273  BMI 30.71 31.53 32.07 33.63 32.08 35.27 38.09        Obesity (BMI 30-39.9) Improved. Pt applauded on succesful weight loss through lifestyle change, and encouraged to continue same. Weight loss goal set for the next several months.   Grief Marked improvement despite recent loss of spouse unexpectedly 3 months ago. Will continue to use her faith to power through, on no medication currently and using support system from her church only  Acute conjunctivitis of left eye Pt educated re natural history of illness and steps to take to prevent spread, antibiotic prescribed for topical use to affected eye in the event of bacterial superinfection       Review of Systems     Objective:   Physical Exam        Assessment & Plan:

## 2015-03-10 NOTE — Assessment & Plan Note (Signed)
Controlled, no change in medication DASH diet and commitment to daily physical activity for a minimum of 30 minutes discussed and encouraged, as a part of hypertension management. The importance of attaining a healthy weight is also discussed.  BP/Weight 03/07/2015 01/07/2015 09/20/2014 09/19/2014 09/05/2014 05/02/2014 8/00/3491  Systolic BP 791 505 697 948 016 96 82  Diastolic BP 66 70 72 54 66 70 60  Wt. (Lbs) 220.12 226 243 241 243.12 252.8 273  BMI 30.71 31.53 32.07 33.63 32.08 35.27 38.09

## 2015-03-10 NOTE — Assessment & Plan Note (Signed)
Marked improvement despite recent loss of spouse unexpectedly 3 months ago. Will continue to use her faith to power through, on no medication currently and using support system from her church only

## 2015-03-10 NOTE — Assessment & Plan Note (Signed)
Pt educated re natural history of illness and steps to take to prevent spread, antibiotic prescribed for topical use to affected eye in the event of bacterial superinfection

## 2015-03-10 NOTE — Assessment & Plan Note (Signed)
Improved. Pt applauded on succesful weight loss through lifestyle change, and encouraged to continue same. Weight loss goal set for the next several months.  

## 2015-04-01 ENCOUNTER — Other Ambulatory Visit: Payer: Self-pay

## 2015-04-01 ENCOUNTER — Telehealth: Payer: Self-pay

## 2015-04-01 MED ORDER — PREDNISONE 5 MG (21) PO TBPK: 5.0000 mg | ORAL_TABLET | Freq: Every day | ORAL | Status: DC

## 2015-04-01 NOTE — Telephone Encounter (Signed)
Prednisone dose pak refilled.   If patient continues to have problems she will call back or go to the urgent care.

## 2015-04-02 ENCOUNTER — Encounter (HOSPITAL_COMMUNITY): Payer: Self-pay | Admitting: Emergency Medicine

## 2015-04-02 ENCOUNTER — Emergency Department (HOSPITAL_COMMUNITY)
Admission: EM | Admit: 2015-04-02 | Discharge: 2015-04-02 | Disposition: A | Discharge status: Home / Self Care | Payer: 59 | Attending: Emergency Medicine | Admitting: Emergency Medicine

## 2015-04-02 DIAGNOSIS — Z8701 Personal history of pneumonia (recurrent): Secondary | ICD-10-CM | POA: Diagnosis not present

## 2015-04-02 DIAGNOSIS — G43909 Migraine, unspecified, not intractable, without status migrainosus: Secondary | ICD-10-CM | POA: Diagnosis not present

## 2015-04-02 DIAGNOSIS — E119 Type 2 diabetes mellitus without complications: Secondary | ICD-10-CM | POA: Insufficient documentation

## 2015-04-02 DIAGNOSIS — Z87891 Personal history of nicotine dependence: Secondary | ICD-10-CM | POA: Diagnosis not present

## 2015-04-02 DIAGNOSIS — M545 Low back pain: Secondary | ICD-10-CM | POA: Diagnosis present

## 2015-04-02 DIAGNOSIS — Z79899 Other long term (current) drug therapy: Secondary | ICD-10-CM | POA: Insufficient documentation

## 2015-04-02 DIAGNOSIS — E669 Obesity, unspecified: Secondary | ICD-10-CM | POA: Diagnosis not present

## 2015-04-02 DIAGNOSIS — I10 Essential (primary) hypertension: Secondary | ICD-10-CM | POA: Insufficient documentation

## 2015-04-02 DIAGNOSIS — Z7952 Long term (current) use of systemic steroids: Secondary | ICD-10-CM | POA: Insufficient documentation

## 2015-04-02 DIAGNOSIS — Z7951 Long term (current) use of inhaled steroids: Secondary | ICD-10-CM | POA: Diagnosis not present

## 2015-04-02 DIAGNOSIS — M5442 Lumbago with sciatica, left side: Secondary | ICD-10-CM | POA: Insufficient documentation

## 2015-04-02 MED ORDER — DICLOFENAC SODIUM 75 MG PO TBEC: 75.0000 mg | DELAYED_RELEASE_TABLET | Freq: Two times a day (BID) | ORAL | Status: DC

## 2015-04-02 MED ORDER — KETOROLAC TROMETHAMINE 60 MG/2ML IM SOLN
60.0000 mg | Freq: Once | INTRAMUSCULAR | Status: AC
  Administered 2015-04-02: 60 mg via INTRAMUSCULAR
  Filled 2015-04-02: qty 2

## 2015-04-02 MED ORDER — METHOCARBAMOL 500 MG PO TABS: 500.0000 mg | ORAL_TABLET | Freq: Four times a day (QID) | ORAL | Status: DC

## 2015-04-02 MED ORDER — METHYLPREDNISOLONE SODIUM SUCC 125 MG IJ SOLR
125.0000 mg | Freq: Once | INTRAMUSCULAR | Status: AC
  Administered 2015-04-02: 125 mg via INTRAMUSCULAR
  Filled 2015-04-02: qty 2

## 2015-04-02 NOTE — Discharge Instructions (Signed)
Chronic Back Pain ° When back pain lasts longer than 3 months, it is called chronic back pain. People with chronic back pain often go through certain periods that are more intense (flare-ups).  °CAUSES °Chronic back pain can be caused by wear and tear (degeneration) on different structures in your back. These structures include: °· The bones of your spine (vertebrae) and the joints surrounding your spinal cord and nerve roots (facets). °· The strong, fibrous tissues that connect your vertebrae (ligaments). °Degeneration of these structures may result in pressure on your nerves. This can lead to constant pain. °HOME CARE INSTRUCTIONS °· Avoid bending, heavy lifting, prolonged sitting, and activities which make the problem worse. °· Take brief periods of rest throughout the day to reduce your pain. Lying down or standing usually is better than sitting while you are resting. °· Take over-the-counter or prescription medicines only as directed by your caregiver. °SEEK IMMEDIATE MEDICAL CARE IF:  °· You have weakness or numbness in one of your legs or feet. °· You have trouble controlling your bladder or bowels. °· You have nausea, vomiting, abdominal pain, shortness of breath, or fainting. °  °This information is not intended to replace advice given to you by your health care provider. Make sure you discuss any questions you have with your health care provider. °  °Document Released: 07/16/2004 Document Revised: 08/31/2011 Document Reviewed: 11/26/2014 °Elsevier Interactive Patient Education ©2016 Else[employer] Inc[employer] ° °Sciatica °Sciatica is pain, weakness, numbness, or tingling along the path of the sciatic nerve. The nerve starts in the lower back and runs down the back of each leg. The nerve controls the muscles in the lower leg and in the back of the knee, while also providing sensation to the back of the thigh, lower leg, and the sole of your foot. Sciatica is a symptom of another medical condition. For instance, nerve  damage or certain conditions, such as a herniated disk or bone spur on the spine, pinch or put pressure on the sciatic nerve. This causes the pain, weakness, or other sensations normally associated with sciatica. Generally, sciatica only affects one side of the body. °CAUSES  °· Herniated or slipped disc. °· Degenerative disk disease. °· A pain disorder involving the narrow muscle in the buttocks (piriformis syndrome). °· Pelvic injury or fracture. °· Pregnancy. °· Tumor (rare). °SYMPTOMS  °Symptoms can vary from mild to very severe. The symptoms usually travel from the low back to the buttocks and down the back of the leg. Symptoms can include: °· Mild tingling or dull aches in the lower back, leg, or hip. °· Numbness in the back of the calf or sole of the foot. °· Burning sensations in the lower back, leg, or hip. °· Sharp pains in the lower back, leg, or hip. °· Leg weakness. °· Severe back pain inhibiting movement. °These symptoms may get worse with coughing, sneezing, laughing, or prolonged sitting or standing. Also, being overweight may worsen symptoms. °DIAGNOSIS  °Your caregiver will perform a physical exam to look for common symptoms of sciatica. He or she may ask you to do certain movements or activities that would trigger sciatic nerve pain. Other tests may be performed to find the cause of the sciatica. These may include: °· Blood tests. °· X-rays. °· Imaging tests, such as an MRI or CT scan. °TREATMENT  °Treatment is directed at the cause of the sciatic pain. Sometimes, treatment is not necessary and the pain and discomfort goes away on its own. If treatment is needed, your   caregiver may suggest: °· Over-the-counter medicines to relieve pain. °· Prescription medicines, such as anti-inflammatory medicine, muscle relaxants, or narcotics. °· Applying heat or ice to the painful area. °· Steroid injections to lessen pain, irritation, and inflammation around the nerve. °· Reducing activity during periods of  pain. °· Exercising and stretching to strengthen your abdomen and improve flexibility of your spine. Your caregiver may suggest losing weight if the extra weight makes the back pain worse. °· Physical therapy. °· Surgery to eliminate what is pressing or pinching the nerve, such as a bone spur or part of a herniated disk. °HOME CARE INSTRUCTIONS  °· Only take over-the-counter or prescription medicines for pain or discomfort as directed by your caregiver. °· Apply ice to the affected area for 20 minutes, 3-4 times a day for the first 48-72 hours. Then try heat in the same way. °· Exercise, stretch, or perform your usual activities if these do not aggravate your pain. °· Attend physical therapy sessions as directed by your caregiver. °· Keep all follow-up appointments as directed by your caregiver. °· Do not wear high heels or shoes that do not provide proper support. °· Check your mattress to see if it is too soft. A firm mattress may lessen your pain and discomfort. °SEEK IMMEDIATE MEDICAL CARE IF:  °· You lose control of your bowel or bladder (incontinence). °· You have increasing weakness in the lower back, pelvis, buttocks, or legs. °· You have redness or swelling of your back. °· You have a burning sensation when you urinate. °· You have pain that gets worse when you lie down or awakens you at night. °· Your pain is worse than you have experienced in the past. °· Your pain is lasting longer than 4 weeks. °· You are suddenly losing weight without reason. °MAKE SURE YOU: °· Understand these instructions. °· Will watch your condition. °· Will get help right away if you are not doing well or get worse. °  °This information is not intended to replace advice given to you by your health care provider. Make sure you discuss any questions you have with your health care provider. °  °Document Released: 06/02/2001 Document Revised: 02/27/2015 Document Reviewed: 10/18/2011 °Elsevier Interactive Patient Education ©2016  Elsevier Inc. ° °

## 2015-04-02 NOTE — ED Provider Notes (Signed)
CSN: [id number]     Arrival date & time 04/02/15  1308 History   First MD Initiated Contact with Patient 04/02/15 1010     Chief Complaint  Patient presents with  . Back Pain     (Consider location/radiation/quality/duration/timing/severity/associated sxs/prior Treatment) Patient is a 44 y.o. female presenting with back pain. The history is provided by the patient. No language interpreter was used.  Back Pain Location:  Lumbar spine Quality:  Aching and shooting Radiates to:  Does not radiate Pain severity:  Severe Onset quality:  Gradual Duration:  1 day Timing:  Constant Progression:  Worsening Chronicity:  New Relieved by:  Nothing Worsened by:  Nothing tried Ineffective treatments:  None tried Associated symptoms: no numbness and no weakness   Risk factors: not obese   Pt has a history of back pain.  Pt reports she has had to have prednisone in the past. Pt reports Dr. Griffin Dakin office called in Prednisone taper.  Pt is out of robaxin.  Pt is requesting a cortisone injection.  Pt reports this normally helps her get better.  Past Medical History  Diagnosis Date  . Essential hypertension, benign   . Supraventricular tachycardia ([place])   . Weight loss     Hopitalized 4 years ago after loosing 200lb on a supervised program , malnurished with severe protein deficiency   . Pneumonia 2007  . Obesity     Since childhood   . Migraine headache 2007  . Spinal disease   . Type 2 diabetes mellitus [facility])    Past Surgical History  Procedure Laterality Date  . Tubal ligation  1996  . Ablation  12-28-2013    slow pathway modification of AVNRT by Dr Lovena Le  . Electrophysiology study N/A 12/28/2013    Procedure: ELECTROPHYSIOLOGY STUDY;  Surgeon: Evans Lance, MD;  Location: [facility] CATH LAB;  Service: Cardiovascular;  Laterality: N/A;  . Supraventricular tachycardia ablation N/A 12/28/2013    Procedure: SUPRAVENTRICULAR TACHYCARDIA ABLATION;  Surgeon: Evans Lance, MD;  Location: [facility] CATH  LAB;  Service: Cardiovascular;  Laterality: N/A;   Family History  Problem Relation Age of Onset  . Hypertension Mother   . Diabetes Mother   . Cancer Mother     Uterine   . Heart failure Mother     CABG  . Cancer Father 57    Prostate   . Hypertension Father   . Bradycardia Father     Irregular heart beat   . Diabetes Brother   . Diabetes Brother   . Hypertension Brother   . Hypertension Brother    Social History  Substance Use Topics  . Smoking status: Former Smoker -- 0.50 packs/day for 7 years    Types: Cigarettes    Quit date: 08/05/2012  . Smokeless tobacco: Never Used     Comment: quit in 2014  . Alcohol Use: No   OB History    No data available     Review of Systems  Musculoskeletal: Positive for back pain.  Neurological: Negative for weakness and numbness.  All other systems reviewed and are negative.     Allergies  Codeine and Metoclopramide hcl  Home Medications   Prior to Admission medications   Medication Sig Start Date End Date Taking? Authorizing Provider  acetaminophen (TYLENOL) 500 MG tablet Take 1,000 mg by mouth every 6 (six) hours as needed (for pain).     Historical Provider, MD  CRESTOR 5 MG tablet TAKE 1 TABLET BY MOUTH ONCE DAILY AT BEDTIME  01/08/15   Fayrene Helper, MD  diclofenac (VOLTAREN) 75 MG EC tablet Take 1 tablet (75 mg total) by mouth 2 (two) times daily. 04/02/15   Fransico Meadow, PA-C  fluticasone [facility]) 50 MCG/ACT nasal spray Place 2 sprays into both nostrils daily. 09/14/13   Fayrene Helper, MD  gabapentin (NEURONTIN) 300 MG capsule One at bedtime as needed, for back pain radiatng to left thigh 09/20/14   Fayrene Helper, MD  gentamicin cream (GARAMYCIN) 0.1 % Apply to affected eye 3 times daily for 1 week 03/07/15   Fayrene Helper, MD  ibuprofen (ADVIL,MOTRIN) 200 MG tablet Take 400 mg by mouth every 6 (six) hours as needed for moderate pain.    Historical Provider, MD  losartan-hydrochlorothiazide (HYZAAR)  100-25 MG per tablet TAKE 1 TABLET BY MOUTH ONCE DAILY 12/20/14   Fayrene Helper, MD  metFORMIN (GLUCOPHAGE XR) 500 MG 24 hr tablet Two tablets once daily 09/05/14   Fayrene Helper, MD  methocarbamol (ROBAXIN) 500 MG tablet Take 1 tablet (500 mg total) by mouth 4 (four) times daily. 04/02/15   Fransico Meadow, PA-C  predniSONE (STERAPRED UNI-PAK 21 TAB) 5 MG (21) TBPK tablet Take 1 tablet (5 mg total) by mouth daily. 04/01/15   Fayrene Helper, MD  temazepam (RESTORIL) 30 MG capsule Take 1 capsule (30 mg total) by mouth at bedtime as needed for sleep. 12/13/14   Fayrene Helper, MD   BP 132/92 mmHg  Pulse 72  Temp(Src) 97.7 F (36.5 C) (Oral)  Resp 18  Ht 5\' 11"  (1.803 m)  Wt 212 lb (96.163 kg)  BMI 29.58 kg/m2  SpO2 99%  LMP 03/23/2015 Physical Exam  Constitutional: She is oriented to person, place, and time. She appears well-developed and well-nourished.  HENT:  Head: Normocephalic and atraumatic.  Right Ear: External ear normal.  Left Ear: External ear normal.  Eyes: Conjunctivae are normal. Pupils are equal, round, and reactive to light.  Neck: Normal range of motion. Neck supple.  Cardiovascular: Normal rate.   Pulmonary/Chest: Effort normal.  Abdominal: Soft.  Musculoskeletal: Normal range of motion.  Neurological: She is alert and oriented to person, place, and time. She has normal reflexes.  Skin: Skin is warm.  Psychiatric: She has a normal mood and affect.  Nursing note and vitals reviewed.   ED Course  Procedures (including critical care time) Labs Review Labs Reviewed - No data to display  Imaging Review No results found. I have personally reviewed and evaluated these images and lab results as part of my medical decision-making.   EKG Interpretation None      MDM  Pt given torodol and solumedrol IM.   Rx for robaxin.   Pt advise to continue prednisone taper, I will add Voltaren for acute discomfort.   Pt advised to see Dr. Moshe Cipro for recheck    Final diagnoses:  Midline low back pain with left-sided sciatica    voltaren Robaxin avs    Fransico Meadow, PA-C 04/02/15 [place], MD 04/04/15 [phone]

## 2015-04-02 NOTE — ED Notes (Signed)
Having back pain since Sunday.  Increasing pain, rating pain 10/10.  Denies any injury.

## 2015-04-17 ENCOUNTER — Ambulatory Visit (INDEPENDENT_AMBULATORY_CARE_PROVIDER_SITE_OTHER): Payer: 59 | Admitting: Cardiology

## 2015-04-17 ENCOUNTER — Encounter: Payer: Self-pay | Admitting: Cardiology

## 2015-04-17 VITALS — BP 108/62 | HR 69 | Ht 71.0 in | Wt 215.1 lb

## 2015-04-17 DIAGNOSIS — I10 Essential (primary) hypertension: Secondary | ICD-10-CM | POA: Diagnosis not present

## 2015-04-17 DIAGNOSIS — I471 Supraventricular tachycardia: Secondary | ICD-10-CM | POA: Diagnosis not present

## 2015-04-17 NOTE — Progress Notes (Signed)
Cardiology Office Note  Date: 04/17/2015   ID: [name], [name] 04-27-1971, MRN [id number]  PCP: Kelsey Nakayama, MD  Primary Cardiologist: Kelsey Lesches, MD   Chief Complaint  Patient presents with  . PSVT    History of Present Illness: [name] is a 44 y.o. female last seen in August 2015. Interval follow-up noted with Dr. Lovena Ortiz. She has a history of RFA for management of AVNRT in July 2015. She presents for a routine visit today and states that she has done very well, no significant palpitations over the last year. She is no longer on Cardizem.  Follow-up ECG today shows sinus rhythm with borderline low voltage and nonspecific T-wave changes.   Past Medical History  Diagnosis Date  . Essential hypertension, benign   . Supraventricular tachycardia [facility])     Status post RFA July 2015 - Dr. Lovena Ortiz  . Weight loss     Hopitalized 4 years ago after loosing 200lb on a supervised program, malnurished with severe protein deficiency   . Pneumonia 2007  . Obesity     Since childhood   . Migraine headache 2007  . Spinal disease   . Type 2 diabetes mellitus ([place])     Current Outpatient Prescriptions  Medication Sig Dispense Refill  . acetaminophen (TYLENOL) 500 MG tablet Take 1,000 mg by mouth every 6 (six) hours as needed (for pain).     . CRESTOR 5 MG tablet TAKE 1 TABLET BY MOUTH ONCE DAILY AT BEDTIME 90 tablet 1  . diclofenac (VOLTAREN) 75 MG EC tablet Take 1 tablet (75 mg total) by mouth 2 (two) times daily. 20 tablet 0  . fluticasone (FLONASE) 50 MCG/ACT nasal spray Place 2 sprays into both nostrils daily. 48 g 1  . gabapentin (NEURONTIN) 300 MG capsule One at bedtime as needed, for back pain radiatng to left thigh 90 capsule 0  . gentamicin cream (GARAMYCIN) 0.1 % Apply to affected eye 3 times daily for 1 week 15 g 0  . ibuprofen (ADVIL,MOTRIN) 200 MG tablet Take 400 mg by mouth every 6 (six) hours as needed for moderate pain.    Marland Kitchen  losartan-hydrochlorothiazide (HYZAAR) 100-25 MG per tablet TAKE 1 TABLET BY MOUTH ONCE DAILY 90 tablet 1  . metFORMIN (GLUCOPHAGE XR) 500 MG 24 hr tablet Two tablets once daily 180 tablet 2  . methocarbamol (ROBAXIN) 500 MG tablet Take 1 tablet (500 mg total) by mouth 4 (four) times daily. 30 tablet 0  . predniSONE (STERAPRED UNI-PAK 21 TAB) 5 MG (21) TBPK tablet Take 1 tablet (5 mg total) by mouth daily. 21 tablet 0  . temazepam (RESTORIL) 30 MG capsule Take 1 capsule (30 mg total) by mouth at bedtime as needed for sleep. 30 capsule 2   No current facility-administered medications for this visit.    Allergies:  Codeine and Metoclopramide hcl   Social History: The patient  reports that she quit smoking about 2 years ago. Her smoking use included Cigarettes. She has a 3.5 pack-year smoking history. She has never used smokeless tobacco. She reports that she does not drink alcohol or use illicit drugs.   ROS:  Please see the history of present illness. Otherwise, complete review of systems is positive for none.  All other systems are reviewed and negative.   Physical Exam: VS:  BP 108/62 mmHg  Pulse 69  Ht 5\' 11"  (1.803 m)  Wt 215 lb 1.6 oz (97.569 kg)  BMI 30.01 kg/m2  SpO2 98%  LMP 03/23/2015, BMI Body mass index is 30.01 kg/(m^2).  Wt Readings from Last 3 Encounters:  04/17/15 215 lb 1.6 oz (97.569 kg)  04/02/15 212 lb (96.163 kg)  03/07/15 220 lb 1.9 oz (99.846 kg)     Patient appears comfortable at rest..  HEENT: Conjunctiva and lids normal, oropharynx clear.  Neck: Supple, no elevated JVP or carotid bruits, no thyromegaly.  Lungs: Clear to auscultation, and nonlabored.  Cardiac: Regular rate and rhythm, soft systolic murmur at the base, no S3 gallop.  Skin: Warm and dry.  Extremities: No pitting edema, distal pulses full.  Neuropsychiatric: Alert and oriented x3, affect appropriate.   ECG: ECG is ordered today.  Recent Labwork: 01/03/2015: ALT 9; AST 11; BUN 15;  Creat 0.79; Potassium 3.4*; Sodium 141; TSH 1.148     Component Value Date/Time   CHOL 153 09/03/2014 0735   TRIG 99 09/03/2014 0735   HDL 52 09/03/2014 0735   CHOLHDL 2.9 09/03/2014 0735   VLDL 20 09/03/2014 0735   LDLCALC 81 09/03/2014 0735    ASSESSMENT AND PLAN:  History of PSVT, status post RFA in July 2015 by Dr. Lovena Ortiz. She has done very well with no recurrent palpitations or obvious arrhythmia. She is now off Cardizem. ECG reviewed. Would recommend strategy of observation at this time. We can see her back as needed.  Current medicines were reviewed at length with the patient today.   Orders Placed This Encounter  Procedures  . EKG 12-Lead    Disposition: FU with me as needed.   Signed, Kelsey Sark, MD, [facility] 04/17/2015 9:22 AM    [facility] [address] [place], [place] [address] Phone: [phone]; Fax: [phone]

## 2015-04-17 NOTE — Patient Instructions (Signed)
Your physician recommends that you schedule a follow-up appointment As Needed  Your physician recommends that you continue on your current medications as directed. Please refer to the Current Medication list given to you today.  If you need a refill on your cardiac medications before your next appointment, please call your pharmacy.  Thank you for choosing [facility]!

## 2015-06-04 LAB — COMPLETE METABOLIC PANEL WITH GFR
ALT: 7 U/L (ref 6–29)
AST: 11 U/L (ref 10–35)
Albumin: 4.3 g/dL (ref 3.6–5.1)
Alkaline Phosphatase: 37 U/L (ref 33–115)
BILIRUBIN TOTAL: 0.3 mg/dL (ref 0.2–1.2)
BUN: 12 mg/dL (ref 7–25)
CHLORIDE: 100 mmol/L (ref 98–110)
CO2: 28 mmol/L (ref 20–31)
Calcium: 9.6 mg/dL (ref 8.6–10.2)
Creat: 0.74 mg/dL (ref 0.50–1.10)
GFR, Est Non African American: >89 mL/min (ref >=60)
Glucose, Bld: 77 mg/dL (ref 65–99)
POTASSIUM: 3.5 mmol/L (ref 3.5–5.3)
SODIUM: 137 mmol/L (ref 135–146)
TOTAL PROTEIN: 7.5 g/dL (ref 6.1–8.1)

## 2015-06-04 LAB — HEMOGLOBIN A1C
HEMOGLOBIN A1C: 5.8 % — AB (ref <5.7)
MEAN PLASMA GLUCOSE: 120 mg/dL — AB (ref <117)

## 2015-06-06 ENCOUNTER — Ambulatory Visit (INDEPENDENT_AMBULATORY_CARE_PROVIDER_SITE_OTHER): Payer: 59 | Admitting: Family Medicine

## 2015-06-06 ENCOUNTER — Encounter: Payer: Self-pay | Admitting: Family Medicine

## 2015-06-06 VITALS — BP 106/68 | HR 86 | Resp 18 | Ht 71.0 in | Wt 208.0 lb

## 2015-06-06 DIAGNOSIS — M5440 Lumbago with sciatica, unspecified side: Secondary | ICD-10-CM | POA: Diagnosis not present

## 2015-06-06 DIAGNOSIS — E1169 Type 2 diabetes mellitus with other specified complication: Secondary | ICD-10-CM

## 2015-06-06 DIAGNOSIS — E119 Type 2 diabetes mellitus without complications: Secondary | ICD-10-CM

## 2015-06-06 DIAGNOSIS — F418 Other specified anxiety disorders: Secondary | ICD-10-CM | POA: Diagnosis not present

## 2015-06-06 DIAGNOSIS — I10 Essential (primary) hypertension: Secondary | ICD-10-CM

## 2015-06-06 DIAGNOSIS — E669 Obesity, unspecified: Secondary | ICD-10-CM

## 2015-06-06 MED ORDER — METFORMIN HCL ER 500 MG PO TB24: 500.0000 mg | ORAL_TABLET | Freq: Every day | ORAL | Status: DC

## 2015-06-06 MED ORDER — MIRTAZAPINE 15 MG PO TBDP: 15.0000 mg | ORAL_TABLET | Freq: Every day | ORAL | Status: DC

## 2015-06-06 MED ORDER — BUSPIRONE HCL 5 MG PO TABS: 5.0000 mg | ORAL_TABLET | Freq: Two times a day (BID) | ORAL | Status: DC

## 2015-06-06 NOTE — Progress Notes (Signed)
Subjective:    Patient ID: [name], female    DOB: December 24, 1970, 44 y.o.   MRN: [id number]  HPI   [name]     MRN: [id number]      DOB: 23-Aug-1970   HPI [name] is here for follow up and re-evaluation of chronic medical conditions, medication management and review of any available recent lab and radiology data.  Preventive health is updated, specifically  Cancer screening and Immunization.    The PT stopped antidepressant previously prescribed, states she "felt weird", and she has not seen a therapist about her grief and depression following unexpected death of her spouse approx 6 months ago. Mentally she is deteriorating, not suicidal or homicidal,poor appetite , weight loss, anxiety and poor sleep C/o increased symptoms of depression, feels overwhelmed , her first Christmas since unexpected death of her spouse  ROS Denies recent fever or chills. Denies sinus pressure, nasal congestion, ear pain or sore throat. Denies chest congestion, productive cough or wheezing. Denies chest pains, palpitations and leg swelling Denies abdominal pain, nausea, vomiting,diarrhea or constipation.   Denies dysuria, frequency, hesitancy or incontinence. C/o back pain , mainly when initiating movement , she experiences stiffness Denies headaches, seizures, numbness, or tingling.  Denies skin break down or rash.   PE  BP 106/68 mmHg  Pulse 86  Resp 18  Ht 5\' 11"  (1.803 m)  Wt 208 lb (94.348 kg)  BMI 29.02 kg/m2  SpO2 100%  Patient alert and oriented and in no cardiopulmonary distress.  HEENT: No facial asymmetry, EOMI,   oropharynx pink and moist.  Neck supple no JVD, no mass.  Chest: Clear to auscultation bilaterally.  CVS: S1, S2 no murmurs, no S3.Regular rate.  ABD: Soft non tender.   Ext: No edema  MS: Adequate ROM spine, shoulders, hips and knees.  Skin: Intact, no ulcerations or rash noted.  Psych: Good eye contact, tearful affect. Memory intact mildly  anxious and  depressed appearing.  CNS: CN 2-12 intact, power,  normal throughout.no focal deficits noted.   Assessment & Plan   Depression with anxiety Deteriorated, pt to start medication prescribed and instructed to call in if unable to tolerate. Recommend psych eval if cannot take this med as she  Has deteriorated since last visit, and still has not been to official Counsellor for help with her grief   Back pain with sciatica Not as severe due to weight loss, no need for chronic pain medication, tylenol recommended for as needed use  Essential hypertension, benign Controlled, no change in medication DASH diet and commitment to daily physical activity for a minimum of 30 minutes discussed and encouraged, as a part of hypertension management. The importance of attaining a healthy weight is also discussed.  BP/Weight 06/06/2015 04/17/2015 04/02/2015 03/07/2015 01/07/2015 09/20/2014 [id number]  Systolic BP A999333 123XX123 [id number]  Diastolic BP 68 62 75 66 70 72 54  Wt. (Lbs) 208 215.1 212 220.12 226 243 241  BMI 29.02 30.01 29.58 30.71 31.53 32.07 33.63        Diabetes mellitus type 2 in obese Controlled, no change in medication [name] is reminded of the importance of commitment to daily physical activity for 30 minutes or more, as able and the need to limit carbohydrate intake to 30 to 60 grams per meal to help with blood sugar control.   The need to take medication as prescribed, test blood sugar as directed, and to call between visits if there is  a concern that blood sugar is uncontrolled is also discussed.   [name] is reminded of the importance of daily foot exam, annual eye examination, and good blood sugar, blood pressure and cholesterol control.  Diabetic Labs Latest Ref Rng 06/03/2015 01/03/2015 09/03/2014 04/30/2014 01/22/2014  HbA1c <5.7 % 5.8(H) 6.1(H) 5.9(H) 5.7(H) 6.4(H)  Microalbumin <2.0 mg/dL - 0.3 - - -  Micro/Creat Ratio 0.0 - 30.0 mg/g - 2.8 - - -    Chol 0 - 200 mg/dL - - 153 - 88  HDL >=46 mg/dL - - 52 - 33(L)  Calc LDL 0 - 99 mg/dL - - 81 - 34  Triglycerides <150 mg/dL - - 99 - 105  Creatinine 0.50 - 1.10 mg/dL 0.74 0.79 0.81 0.92 0.85  GFR >60.00 mL/min - - - - -   BP/Weight 06/06/2015 04/17/2015 04/02/2015 03/07/2015 01/07/2015 09/20/2014 [id number]  Systolic BP A999333 123XX123 [id number] A999333 [id number]  Diastolic BP 68 62 75 66 70 72 54  Wt. (Lbs) 208 215.1 212 220.12 226 243 241  BMI 29.02 30.01 29.58 30.71 31.53 32.07 33.63   Foot/eye exam completion dates Latest Ref Rng 02/19/2015 01/07/2015  Eye Exam No Retinopathy No Retinopathy -  Foot Form Completion - - Done              Review of Systems     Objective:   Physical Exam        Assessment & Plan:

## 2015-06-06 NOTE — Patient Instructions (Signed)
F/u in 6 weeeks , call if you need me before   Med changes as discussed, buspar, Remeron and lower dose metformin    Better days ahead , one day at a time  Info for counselling through job will be provided

## 2015-06-07 ENCOUNTER — Other Ambulatory Visit: Payer: Self-pay | Admitting: Family Medicine

## 2015-06-24 DIAGNOSIS — F418 Other specified anxiety disorders: Secondary | ICD-10-CM | POA: Insufficient documentation

## 2015-06-24 NOTE — Assessment & Plan Note (Signed)
Not as severe due to weight loss, no need for chronic pain medication, tylenol recommended for as needed use

## 2015-06-24 NOTE — Assessment & Plan Note (Signed)
Controlled, no change in medication [name] is reminded of the importance of commitment to daily physical activity for 30 minutes or more, as able and the need to limit carbohydrate intake to 30 to 60 grams per meal to help with blood sugar control.   The need to take medication as prescribed, test blood sugar as directed, and to call between visits if there is a concern that blood sugar is uncontrolled is also discussed.   [name] is reminded of the importance of daily foot exam, annual eye examination, and good blood sugar, blood pressure and cholesterol control.  Diabetic Labs Latest Ref Rng 06/03/2015 01/03/2015 09/03/2014 04/30/2014 01/22/2014  HbA1c <5.7 % 5.8(H) 6.1(H) 5.9(H) 5.7(H) 6.4(H)  Microalbumin <2.0 mg/dL - 0.3 - - -  Micro/Creat Ratio 0.0 - 30.0 mg/g - 2.8 - - -  Chol 0 - 200 mg/dL - - 153 - 88  HDL >=46 mg/dL - - 52 - 33(L)  Calc LDL 0 - 99 mg/dL - - 81 - 34  Triglycerides <150 mg/dL - - 99 - 105  Creatinine 0.50 - 1.10 mg/dL 0.74 0.79 0.81 0.92 0.85  GFR >60.00 mL/min - - - - -   BP/Weight 06/06/2015 04/17/2015 04/02/2015 03/07/2015 01/07/2015 09/20/2014 [id number]  Systolic BP A999333 123XX123 [id number] A999333 [id number]  Diastolic BP 68 62 75 66 70 72 54  Wt. (Lbs) 208 215.1 212 220.12 226 243 241  BMI 29.02 30.01 29.58 30.71 31.53 32.07 33.63   Foot/eye exam completion dates Latest Ref Rng 02/19/2015 01/07/2015  Eye Exam No Retinopathy No Retinopathy -  Foot Form Completion - - Done

## 2015-06-24 NOTE — Assessment & Plan Note (Signed)
Deteriorated, pt to start medication prescribed and instructed to call in if unable to tolerate. Recommend psych eval if cannot take this med as she  Has deteriorated since last visit, and still has not been to official Counsellor for help with her grief

## 2015-06-24 NOTE — Assessment & Plan Note (Signed)
Controlled, no change in medication DASH diet and commitment to daily physical activity for a minimum of 30 minutes discussed and encouraged, as a part of hypertension management. The importance of attaining a healthy weight is also discussed.  BP/Weight 06/06/2015 04/17/2015 04/02/2015 03/07/2015 01/07/2015 09/20/2014 [id number]  Systolic BP A999333 123XX123 [id number] A999333 [id number]  Diastolic BP 68 62 75 66 70 72 54  Wt. (Lbs) 208 215.1 212 220.12 226 243 241  BMI 29.02 30.01 29.58 30.71 31.53 32.07 33.63

## 2015-07-18 ENCOUNTER — Ambulatory Visit: Payer: 59 | Admitting: Family Medicine

## 2015-08-21 ENCOUNTER — Other Ambulatory Visit: Payer: Self-pay | Admitting: Family Medicine

## 2015-08-21 DIAGNOSIS — Z1231 Encounter for screening mammogram for malignant neoplasm of breast: Secondary | ICD-10-CM

## 2015-09-09 ENCOUNTER — Ambulatory Visit (HOSPITAL_COMMUNITY)
Admission: RE | Admit: 2015-09-09 | Discharge: 2015-09-09 | Disposition: A | Discharge status: Home / Self Care | Payer: 59 | Source: Ambulatory Visit | Attending: Family Medicine | Admitting: Family Medicine

## 2015-09-09 DIAGNOSIS — Z1231 Encounter for screening mammogram for malignant neoplasm of breast: Secondary | ICD-10-CM | POA: Diagnosis not present

## 2015-09-09 DIAGNOSIS — R928 Other abnormal and inconclusive findings on diagnostic imaging of breast: Secondary | ICD-10-CM | POA: Insufficient documentation

## 2015-09-16 ENCOUNTER — Ambulatory Visit (INDEPENDENT_AMBULATORY_CARE_PROVIDER_SITE_OTHER): Payer: 59 | Admitting: Family Medicine

## 2015-09-16 ENCOUNTER — Encounter: Payer: Self-pay | Admitting: Family Medicine

## 2015-09-16 ENCOUNTER — Other Ambulatory Visit: Payer: Self-pay | Admitting: Family Medicine

## 2015-09-16 VITALS — BP 94/60 | HR 68 | Resp 18 | Ht 71.0 in | Wt 211.0 lb

## 2015-09-16 DIAGNOSIS — R928 Other abnormal and inconclusive findings on diagnostic imaging of breast: Secondary | ICD-10-CM

## 2015-09-16 DIAGNOSIS — F418 Other specified anxiety disorders: Secondary | ICD-10-CM

## 2015-09-16 DIAGNOSIS — E785 Hyperlipidemia, unspecified: Secondary | ICD-10-CM

## 2015-09-16 DIAGNOSIS — E119 Type 2 diabetes mellitus without complications: Secondary | ICD-10-CM

## 2015-09-16 DIAGNOSIS — E669 Obesity, unspecified: Secondary | ICD-10-CM

## 2015-09-16 DIAGNOSIS — E559 Vitamin D deficiency, unspecified: Secondary | ICD-10-CM

## 2015-09-16 DIAGNOSIS — I10 Essential (primary) hypertension: Secondary | ICD-10-CM | POA: Diagnosis not present

## 2015-09-16 DIAGNOSIS — E1169 Type 2 diabetes mellitus with other specified complication: Secondary | ICD-10-CM

## 2015-09-16 MED ORDER — LOSARTAN POTASSIUM 50 MG PO TABS: 50.0000 mg | ORAL_TABLET | Freq: Every day | ORAL | Status: DC

## 2015-09-16 NOTE — Patient Instructions (Addendum)
F/u with rectal in 6 to 8 weeks , call if you need me sooner  STOP Losartan/ HCTZ , blood pressure is low  NEW is cozaar 50 mg daily  Fasting labs are due this week  Depression improved  Call and schedule rept mammogrm as recent was abn  Thanks for choosing [facility], we consider it a privelige to serve you. Labs needed asap

## 2015-09-16 NOTE — Progress Notes (Signed)
Subjective:    Patient ID: [name], female    DOB: Mar 30, 1971, 44 y.o.   MRN: [id number]  HPI    [name]     MRN: [id number]      DOB: 1971-03-03   HPI [name] is here for follow up and re-evaluation of chronic medical conditions, medication management and review of any available recent lab and radiology data.  Preventive health is updated, specifically  Cancer screening and Immunization.   Questions or concerns regarding consultations or procedures which the PT has had in the interim are  addressed. The PT denies any adverse reactions to current medications since the last visit.  There are no new concerns.  There are no specific complaints   ROS Denies recent fever or chills. Denies sinus pressure, nasal congestion, ear pain or sore throat. Denies chest congestion, productive cough or wheezing. Denies chest pains, palpitations and leg swelling Denies abdominal pain, nausea, vomiting,diarrhea or constipation.   Denies dysuria, frequency, hesitancy or incontinence. Denies joint pain, swelling and limitation in mobility. Denies headaches, seizures, numbness, or tingling. Denies depression, anxiety or insomnia. Denies skin break down or rash.   PE  BP 94/60 mmHg  Pulse 68  Resp 18  Ht 5\' 11"  (1.803 m)  Wt 211 lb (95.709 kg)  BMI 29.44 kg/m2  SpO2 98%  LMP 08/26/2015  Patient alert and oriented and in no cardiopulmonary distress.  HEENT: No facial asymmetry, EOMI,   oropharynx pink and moist.  Neck supple no JVD, no mass.  Chest: Clear to auscultation bilaterally.  CVS: S1, S2 no murmurs, no S3.Regular rate.  ABD: Soft non tender.   Ext: No edema  MS: Adequate ROM spine, shoulders, hips and knees.  Skin: Intact, no ulcerations or rash noted.  Psych: Good eye contact, normal affect. Memory intact not anxious or depressed appearing.  CNS: CN 2-12 intact, power,  normal throughout.no focal deficits noted.   Assessment &  Plan   Essential hypertension, benign Over corrected, change BP med , recheck BP in 6 weeks DASH diet and commitment to daily physical activity for a minimum of 30 minutes discussed and encouraged, as a part of hypertension management. The importance of attaining a healthy weight is also discussed.  BP/Weight 09/16/2015 06/06/2015 04/17/2015 04/02/2015 03/07/2015 01/07/2015 [id number]  Systolic BP 94 A999333 123XX123 [id number] A999333 [id number]  Diastolic BP 60 68 62 75 66 70 72  Wt. (Lbs) 211 208 215.1 212 220.12 226 243  BMI 29.44 29.02 30.01 29.58 30.71 31.53 32.07        Depression with anxiety Marked improvement, some of which is because her daughter had a breakdown mentally dealing with her Dad's passing which [pushed [name] in the recovery mode for herself She will continue current med  Diabetes mellitus type 2 in obese Controlled, no change in medication [name] is reminded of the importance of commitment to daily physical activity for 30 minutes or more, as able and the need to limit carbohydrate intake to 30 to 60 grams per meal to help with blood sugar control.   The need to take medication as prescribed, test blood sugar as directed, and to call between visits if there is a concern that blood sugar is uncontrolled is also discussed.   [name] is reminded of the importance of daily foot exam, annual eye examination, and good blood sugar, blood pressure and cholesterol control.  Diabetic Labs Latest Ref Rng 06/03/2015 01/03/2015 09/03/2014 04/30/2014 01/22/2014  HbA1c <5.7 %  5.8(H) 6.1(H) 5.9(H) 5.7(H) 6.4(H)  Microalbumin <2.0 mg/dL - 0.3 - - -  Micro/Creat Ratio 0.0 - 30.0 mg/g - 2.8 - - -  Chol 0 - 200 mg/dL - - 153 - 88  HDL >=46 mg/dL - - 52 - 33(L)  Calc LDL 0 - 99 mg/dL - - 81 - 34  Triglycerides <150 mg/dL - - 99 - 105  Creatinine 0.50 - 1.10 mg/dL 0.74 0.79 0.81 0.92 0.85  GFR >60.00 mL/min - - - - -   BP/Weight 09/16/2015 06/06/2015 04/17/2015 04/02/2015 03/07/2015 01/07/2015  [id number]  Systolic BP 94 A999333 123XX123 [id number] A999333 [id number]  Diastolic BP 60 68 62 75 66 70 72  Wt. (Lbs) 211 208 215.1 212 220.12 226 243  BMI 29.44 29.02 30.01 29.58 30.71 31.53 32.07   Foot/eye exam completion dates Latest Ref Rng 02/19/2015 01/07/2015  Eye Exam No Retinopathy No Retinopathy -  Foot Form Completion - - Done         Obesity (BMI 30-39.9) Deteriorated. Patient re-educated about  the importance of commitment to a  minimum of 150 minutes of exercise per week.  The importance of healthy food choices with portion control discussed. Encouraged to start a food diary, count calories and to consider  joining a support group. Sample diet sheets offered. Goals set by the patient for the next several months.   Weight /BMI 09/16/2015 06/06/2015 04/17/2015  WEIGHT 211 lb 208 lb 215 lb 1.6 oz  HEIGHT 5\' 11"  5\' 11"  5\' 11"   BMI 29.44 kg/m2 29.02 kg/m2 30.01 kg/m2    Current exercise per week 90 minutes.   Hyperlipidemia LDL goal <100 Hyperlipidemia:Low fat diet discussed and encouraged.   Lipid Panel  Lab Results  Component Value Date   CHOL 153 09/03/2014   HDL 52 09/03/2014   LDLCALC 81 09/03/2014   TRIG 99 09/03/2014   CHOLHDL 2.9 09/03/2014   Updated lab needed at/ before next visit.          Review of Systems     Objective:   Physical Exam        Assessment & Plan:

## 2015-09-17 ENCOUNTER — Other Ambulatory Visit (HOSPITAL_COMMUNITY): Payer: Self-pay | Admitting: Family Medicine

## 2015-09-17 DIAGNOSIS — R928 Other abnormal and inconclusive findings on diagnostic imaging of breast: Secondary | ICD-10-CM

## 2015-09-17 NOTE — Assessment & Plan Note (Signed)
Controlled, no change in medication [name] is reminded of the importance of commitment to daily physical activity for 30 minutes or more, as able and the need to limit carbohydrate intake to 30 to 60 grams per meal to help with blood sugar control.   The need to take medication as prescribed, test blood sugar as directed, and to call between visits if there is a concern that blood sugar is uncontrolled is also discussed.   [name] is reminded of the importance of daily foot exam, annual eye examination, and good blood sugar, blood pressure and cholesterol control.  Diabetic Labs Latest Ref Rng 06/03/2015 01/03/2015 09/03/2014 04/30/2014 01/22/2014  HbA1c <5.7 % 5.8(H) 6.1(H) 5.9(H) 5.7(H) 6.4(H)  Microalbumin <2.0 mg/dL - 0.3 - - -  Micro/Creat Ratio 0.0 - 30.0 mg/g - 2.8 - - -  Chol 0 - 200 mg/dL - - 153 - 88  HDL >=46 mg/dL - - 52 - 33(L)  Calc LDL 0 - 99 mg/dL - - 81 - 34  Triglycerides <150 mg/dL - - 99 - 105  Creatinine 0.50 - 1.10 mg/dL 0.74 0.79 0.81 0.92 0.85  GFR >60.00 mL/min - - - - -   BP/Weight 09/16/2015 06/06/2015 04/17/2015 04/02/2015 03/07/2015 01/07/2015 123XX123  Systolic BP 94 A999333 123XX123 [id number] A999333 [id number]  Diastolic BP 60 68 62 75 66 70 72  Wt. (Lbs) 211 208 215.1 212 220.12 226 243  BMI 29.44 29.02 30.01 29.58 30.71 31.53 32.07   Foot/eye exam completion dates Latest Ref Rng 02/19/2015 01/07/2015  Eye Exam No Retinopathy No Retinopathy -  Foot Form Completion - - Done

## 2015-09-17 NOTE — Assessment & Plan Note (Signed)
Marked improvement, some of which is because her daughter had a breakdown mentally dealing with her Dad's passing which [pushed Timyra in the recovery mode for herself She will continue current med

## 2015-09-17 NOTE — Assessment & Plan Note (Signed)
Over corrected, change BP med , recheck BP in 6 weeks DASH diet and commitment to daily physical activity for a minimum of 30 minutes discussed and encouraged, as a part of hypertension management. The importance of attaining a healthy weight is also discussed.  BP/Weight 09/16/2015 06/06/2015 04/17/2015 04/02/2015 03/07/2015 01/07/2015 123XX123  Systolic BP 94 A999333 123XX123 [id number] A999333 [id number]  Diastolic BP 60 68 62 75 66 70 72  Wt. (Lbs) 211 208 215.1 212 220.12 226 243  BMI 29.44 29.02 30.01 29.58 30.71 31.53 32.07

## 2015-09-17 NOTE — Assessment & Plan Note (Signed)
Hyperlipidemia:Low fat diet discussed and encouraged.   Lipid Panel  Lab Results  Component Value Date   CHOL 153 09/03/2014   HDL 52 09/03/2014   LDLCALC 81 09/03/2014   TRIG 99 09/03/2014   CHOLHDL 2.9 09/03/2014   Updated lab needed at/ before next visit.

## 2015-09-17 NOTE — Assessment & Plan Note (Signed)
Deteriorated. Patient re-educated about  the importance of commitment to a  minimum of 150 minutes of exercise per week.  The importance of healthy food choices with portion control discussed. Encouraged to start a food diary, count calories and to consider  joining a support group. Sample diet sheets offered. Goals set by the patient for the next several months.   Weight /BMI 09/16/2015 06/06/2015 04/17/2015  WEIGHT 211 lb 208 lb 215 lb 1.6 oz  HEIGHT 5\' 11"  5\' 11"  5\' 11"   BMI 29.44 kg/m2 29.02 kg/m2 30.01 kg/m2    Current exercise per week 90 minutes.

## 2015-09-21 DIAGNOSIS — E119 Type 2 diabetes mellitus without complications: Secondary | ICD-10-CM | POA: Diagnosis not present

## 2015-09-21 DIAGNOSIS — I10 Essential (primary) hypertension: Secondary | ICD-10-CM | POA: Diagnosis not present

## 2015-09-21 DIAGNOSIS — E559 Vitamin D deficiency, unspecified: Secondary | ICD-10-CM | POA: Diagnosis not present

## 2015-09-21 DIAGNOSIS — E785 Hyperlipidemia, unspecified: Secondary | ICD-10-CM | POA: Diagnosis not present

## 2015-09-21 DIAGNOSIS — E669 Obesity, unspecified: Secondary | ICD-10-CM | POA: Diagnosis not present

## 2015-09-21 LAB — COMPLETE METABOLIC PANEL WITH GFR
ALT: 9 U/L (ref 6–29)
AST: 11 U/L (ref 10–35)
Albumin: 4.2 g/dL (ref 3.6–5.1)
Alkaline Phosphatase: 32 U/L — ABNORMAL LOW (ref 33–115)
BUN: 16 mg/dL (ref 7–25)
CHLORIDE: 103 mmol/L (ref 98–110)
CO2: 24 mmol/L (ref 20–31)
CREATININE: 0.89 mg/dL (ref 0.50–1.10)
Calcium: 9.2 mg/dL (ref 8.6–10.2)
GFR, Est African American: >89 mL/min (ref >=60)
GFR, Est Non African American: 79 mL/min (ref >=60)
Glucose, Bld: 79 mg/dL (ref 65–99)
Potassium: 4.1 mmol/L (ref 3.5–5.3)
SODIUM: 138 mmol/L (ref 135–146)
TOTAL PROTEIN: 6.9 g/dL (ref 6.1–8.1)
Total Bilirubin: 0.4 mg/dL (ref 0.2–1.2)

## 2015-09-21 LAB — LIPID PANEL
CHOLESTEROL: 160 mg/dL (ref 125–200)
HDL: 67 mg/dL (ref >=46)
LDL Cholesterol: 79 mg/dL (ref <130)
Total CHOL/HDL Ratio: 2.4 Ratio (ref <=5.0)
Triglycerides: 72 mg/dL (ref <150)
VLDL: 14 mg/dL (ref <30)

## 2015-09-22 LAB — HEMOGLOBIN A1C
Hgb A1c MFr Bld: 6 % — ABNORMAL HIGH (ref <5.7)
MEAN PLASMA GLUCOSE: 126 mg/dL

## 2015-09-23 LAB — VITAMIN D 25 HYDROXY (VIT D DEFICIENCY, FRACTURES): VIT D 25 HYDROXY: 15 ng/mL — AB (ref 30–100)

## 2015-09-24 ENCOUNTER — Other Ambulatory Visit (HOSPITAL_COMMUNITY): Payer: Self-pay | Admitting: Family Medicine

## 2015-09-24 ENCOUNTER — Ambulatory Visit (HOSPITAL_COMMUNITY)
Admission: RE | Admit: 2015-09-24 | Discharge: 2015-09-24 | Disposition: A | Discharge status: Home / Self Care | Payer: 59 | Source: Ambulatory Visit | Attending: Family Medicine | Admitting: Family Medicine

## 2015-09-24 DIAGNOSIS — N6489 Other specified disorders of breast: Secondary | ICD-10-CM | POA: Diagnosis not present

## 2015-09-24 DIAGNOSIS — R928 Other abnormal and inconclusive findings on diagnostic imaging of breast: Secondary | ICD-10-CM

## 2015-09-24 DIAGNOSIS — N63 Unspecified lump in breast: Secondary | ICD-10-CM | POA: Insufficient documentation

## 2015-09-25 MED ORDER — VITAMIN D (ERGOCALCIFEROL) 1.25 MG (50000 UNIT) PO CAPS: 50000.0000 [IU] | ORAL_CAPSULE | ORAL | Status: DC

## 2015-09-25 NOTE — Addendum Note (Signed)
Addended by: Denman George B on: 09/25/2015 08:51 AM   Modules accepted: Orders

## 2015-09-30 ENCOUNTER — Encounter: Payer: Self-pay | Admitting: Family Medicine

## 2015-10-01 ENCOUNTER — Ambulatory Visit (HOSPITAL_COMMUNITY)
Admission: RE | Admit: 2015-10-01 | Discharge: 2015-10-01 | Disposition: A | Discharge status: Home / Self Care | Payer: 59 | Source: Ambulatory Visit | Attending: Family Medicine | Admitting: Family Medicine

## 2015-10-01 ENCOUNTER — Other Ambulatory Visit: Payer: Self-pay | Admitting: Family Medicine

## 2015-10-01 ENCOUNTER — Ambulatory Visit (HOSPITAL_COMMUNITY): Admission: RE | Admit: 2015-10-01 | Payer: 59 | Source: Ambulatory Visit

## 2015-10-01 ENCOUNTER — Encounter (HOSPITAL_COMMUNITY): Payer: Self-pay

## 2015-10-01 DIAGNOSIS — R928 Other abnormal and inconclusive findings on diagnostic imaging of breast: Secondary | ICD-10-CM

## 2015-10-01 DIAGNOSIS — N63 Unspecified lump in breast: Secondary | ICD-10-CM | POA: Diagnosis not present

## 2015-10-01 DIAGNOSIS — N6310 Unspecified lump in the right breast, unspecified quadrant: Secondary | ICD-10-CM

## 2015-10-01 HISTORY — PX: BREAST BIOPSY: SHX20

## 2015-10-01 MED ORDER — LIDOCAINE-EPINEPHRINE (PF) 1 %-1:200000 IJ SOLN
INTRAMUSCULAR | Status: AC
  Filled 2015-10-01: qty 10

## 2015-10-01 MED ORDER — LIDOCAINE HCL (PF) 1 % IJ SOLN
INTRAMUSCULAR | Status: AC
  Filled 2015-10-01: qty 5

## 2015-10-02 ENCOUNTER — Telehealth: Payer: Self-pay | Admitting: Family Medicine

## 2015-10-02 ENCOUNTER — Other Ambulatory Visit: Payer: Self-pay

## 2015-10-02 MED ORDER — HYDROCHLOROTHIAZIDE 12.5 MG PO CAPS: 12.5000 mg | ORAL_CAPSULE | Freq: Every day | ORAL | Status: DC

## 2015-10-02 NOTE — Telephone Encounter (Signed)
States her legs have been swelling for a week or so now and she had some left over hctz pills and she took them and the swelling went down. Now they are starting to swell again. Wants an rx for a fluid pill sent to [facility] to use prn

## 2015-10-02 NOTE — Telephone Encounter (Signed)
[name] is calling stating that she thinks she  needs her fluid pill back, please advise?

## 2015-10-02 NOTE — Telephone Encounter (Signed)
Med sent and message left for patient to call back and schedule a nurse visit

## 2015-10-02 NOTE — Telephone Encounter (Signed)
pls send HCTZ 12.5 mg daily and arrange nurse BP check in 8 weeks I thought I had responded top this already!

## 2015-11-01 ENCOUNTER — Telehealth: Payer: 59 | Admitting: Family

## 2015-11-01 DIAGNOSIS — B349 Viral infection, unspecified: Secondary | ICD-10-CM | POA: Diagnosis not present

## 2015-11-01 DIAGNOSIS — J329 Chronic sinusitis, unspecified: Secondary | ICD-10-CM

## 2015-11-01 DIAGNOSIS — B9789 Other viral agents as the cause of diseases classified elsewhere: Secondary | ICD-10-CM

## 2015-11-01 NOTE — Progress Notes (Signed)

## 2015-11-13 ENCOUNTER — Ambulatory Visit (INDEPENDENT_AMBULATORY_CARE_PROVIDER_SITE_OTHER): Payer: 59 | Admitting: Family Medicine

## 2015-11-13 ENCOUNTER — Encounter: Payer: Self-pay | Admitting: Family Medicine

## 2015-11-13 VITALS — BP 110/64 | HR 92 | Resp 18 | Ht 71.0 in | Wt 212.0 lb

## 2015-11-13 DIAGNOSIS — Z1211 Encounter for screening for malignant neoplasm of colon: Secondary | ICD-10-CM | POA: Diagnosis not present

## 2015-11-13 DIAGNOSIS — E559 Vitamin D deficiency, unspecified: Secondary | ICD-10-CM | POA: Diagnosis not present

## 2015-11-13 DIAGNOSIS — E663 Overweight: Secondary | ICD-10-CM

## 2015-11-13 DIAGNOSIS — I10 Essential (primary) hypertension: Secondary | ICD-10-CM | POA: Diagnosis not present

## 2015-11-13 DIAGNOSIS — E1169 Type 2 diabetes mellitus with other specified complication: Secondary | ICD-10-CM

## 2015-11-13 DIAGNOSIS — E785 Hyperlipidemia, unspecified: Secondary | ICD-10-CM

## 2015-11-13 DIAGNOSIS — E119 Type 2 diabetes mellitus without complications: Secondary | ICD-10-CM | POA: Diagnosis not present

## 2015-11-13 DIAGNOSIS — E669 Obesity, unspecified: Secondary | ICD-10-CM

## 2015-11-13 DIAGNOSIS — M5440 Lumbago with sciatica, unspecified side: Secondary | ICD-10-CM

## 2015-11-13 LAB — POC HEMOCCULT BLD/STL (OFFICE/1-CARD/DIAGNOSTIC): Fecal Occult Blood, POC: NEGATIVE

## 2015-11-13 NOTE — Progress Notes (Signed)
Subjective:    Patient ID: [name], female    DOB: 1971/01/17, 45 y.o.   MRN: [id number]  HPI   [name]     MRN: [id number]      DOB: Apr 24, 1971   HPI [name] is here for follow up and re-evaluation of chronic medical conditions, medication management and review of any available recent lab and radiology data.  Preventive health is updated, specifically  Cancer screening and Immunization.   Questions or concerns regarding consultations or procedures which the PT has had in the interim are  addressed. The PT denies any adverse reactions to current medications since the last visit.  There are no new concerns.  There are no specific complaints   ROS Denies recent fever or chills. Denies sinus pressure, nasal congestion, ear pain or sore throat. Denies chest congestion, productive cough or wheezing. Denies chest pains, palpitations and leg swelling Denies abdominal pain, nausea, vomiting,diarrhea or constipation.   Denies dysuria, frequency, hesitancy or incontinence. Denies joint pain, swelling and limitation in mobility. Denies headaches, seizures, numbness, or tingling. Denies depression, anxiety or insomnia. Denies skin break down or rash.   PE  BP 110/64 mmHg  Pulse 92  Resp 18  Ht 5\' 11"  (1.803 m)  Wt 212 lb (96.163 kg)  BMI 29.58 kg/m2  SpO2 96%  Patient alert and oriented and in no cardiopulmonary distress.  HEENT: No facial asymmetry, EOMI,   oropharynx pink and moist.  Neck supple no JVD, no mass.  Chest: Clear to auscultation bilaterally.  CVS: S1, S2 no murmurs, no S3.Regular rate.  ABD: Soft non tender. No organomegaly or mass Rectal; no mass, heme negative stool  Ext: No edema  MS: Adequate ROM spine, shoulders, hips and knees.  Skin: Intact, no ulcerations or rash noted.  Psych: Good eye contact, normal affect. Memory intact not anxious or depressed appearing.  CNS: CN 2-12 intact, power,  normal throughout.no focal deficits  noted.   Assessment & Plan   Essential hypertension, benign Controlled, no change in medication DASH diet and commitment to daily physical activity for a minimum of 30 minutes discussed and encouraged, as a part of hypertension management. The importance of attaining a healthy weight is also discussed.  BP/Weight 11/13/2015 09/16/2015 06/06/2015 04/17/2015 04/02/2015 03/07/2015 [id number]  Systolic BP A999333 94 A999333 123XX123 [id number] A999333 123456  Diastolic BP 64 60 68 62 75 66 70  Wt. (Lbs) 212 211 208 215.1 212 220.12 226  BMI 29.58 29.44 29.02 30.01 29.58 30.71 31.53        SUPRAVENTRICULAR TACHYCARDIA Rate controlled and stable  Diabetes mellitus type 2 in obese Controlled, no change in medication [name] is reminded of the importance of commitment to daily physical activity for 30 minutes or more, as able and the need to limit carbohydrate intake to 30 to 60 grams per meal to help with blood sugar control.   The need to take medication as prescribed, test blood sugar as directed, and to call between visits if there is a concern that blood sugar is uncontrolled is also discussed.   [name] is reminded of the importance of daily foot exam, annual eye examination, and good blood sugar, blood pressure and cholesterol control.  Diabetic Labs Latest Ref Rng 09/21/2015 06/03/2015 01/03/2015 09/03/2014 04/30/2014  HbA1c <5.7 % 6.0(H) 5.8(H) 6.1(H) 5.9(H) 5.7(H)  Microalbumin <2.0 mg/dL - - 0.3 - -  Micro/Creat Ratio 0.0 - 30.0 mg/g - - 2.8 - -  Chol 125 -  200 mg/dL 160 - - 153 -  HDL >=46 mg/dL 67 - - 52 -  Calc LDL <130 mg/dL 79 - - 81 -  Triglycerides <150 mg/dL 72 - - 99 -  Creatinine 0.50 - 1.10 mg/dL 0.89 0.74 0.79 0.81 0.92  GFR >60.00 mL/min - - - - -   BP/Weight 11/13/2015 09/16/2015 06/06/2015 04/17/2015 04/02/2015 03/07/2015 [id number]  Systolic BP A999333 94 A999333 123XX123 [id number] A999333 123456  Diastolic BP 64 60 68 62 75 66 70  Wt. (Lbs) 212 211 208 215.1 212 220.12 226  BMI 29.58 29.44 29.02 30.01  29.58 30.71 31.53   Foot/eye exam completion dates Latest Ref Rng 02/19/2015 01/07/2015  Eye Exam No Retinopathy No Retinopathy -  Foot Form Completion - - Done         Hyperlipidemia LDL goal <100 Hyperlipidemia:Low fat diet discussed and encouraged.   Lipid Panel  Lab Results  Component Value Date   CHOL 160 09/21/2015   HDL 67 09/21/2015   LDLCALC 79 09/21/2015   TRIG 72 09/21/2015   CHOLHDL 2.4 09/21/2015   Controlled, no change in medication      Overweight (BMI 25.0-29.9) Deteriorated. Patient re-educated about  the importance of commitment to a  minimum of 150 minutes of exercise per week.  The importance of healthy food choices with portion control discussed. Encouraged to start a food diary, count calories and to consider  joining a support group. Sample diet sheets offered. Goals set by the patient for the next several months.   Weight /BMI 11/13/2015 09/16/2015 06/06/2015  WEIGHT 212 lb 211 lb 208 lb  HEIGHT 5\' 11"  5\' 11"  5\' 11"   BMI 29.58 kg/m2 29.44 kg/m2 29.02 kg/m2    Current exercise per week 90 minutes.   Back pain with sciatica No current flare, usese med as needed      Review of Systems     Objective:   Physical Exam        Assessment & Plan:

## 2015-11-13 NOTE — Patient Instructions (Addendum)
F/u in 5 month, call if you need me before  Fasting labs in 5 months  Meds as discussed and reviewed  Thankful you are better  Rectal exam today  Please work on good  health habits so that your health will improve. 1. Commitment to daily physical activity for 30 to 60  minutes, if you are able to do this.  2. Commitment to wise food choices. Aim for half of your  food intake to be vegetable and fruit, one quarter starchy foods, and one quarter protein. Try to eat on a regular schedule  3 meals per day, snacking between meals should be limited to vegetables or fruits or small portions of nuts. 64 ounces of water per day is generally recommended, unless you have specific health conditions, like heart failure or kidney failure where you will need to limit fluid intake.  3. Commitment to sufficient and a  good quality of physical and mental rest daily, generally between 6 to 8 hours per day.  WITH PERSISTANCE AND PERSEVERANCE, THE IMPOSSIBLE , BECOMES THE NORM! Thank you  for choosing [facility]. We consider it a privelige to serve you.  Delivering excellent health care in a caring and  compassionate way is our goal.  Partnering with you,  so that together we can achieve this goal is our strategy.

## 2015-11-17 NOTE — Assessment & Plan Note (Signed)
Controlled, no change in medication [name] is reminded of the importance of commitment to daily physical activity for 30 minutes or more, as able and the need to limit carbohydrate intake to 30 to 60 grams per meal to help with blood sugar control.   The need to take medication as prescribed, test blood sugar as directed, and to call between visits if there is a concern that blood sugar is uncontrolled is also discussed.   [name] is reminded of the importance of daily foot exam, annual eye examination, and good blood sugar, blood pressure and cholesterol control.  Diabetic Labs Latest Ref Rng 09/21/2015 06/03/2015 01/03/2015 09/03/2014 04/30/2014  HbA1c <5.7 % 6.0(H) 5.8(H) 6.1(H) 5.9(H) 5.7(H)  Microalbumin <2.0 mg/dL - - 0.3 - -  Micro/Creat Ratio 0.0 - 30.0 mg/g - - 2.8 - -  Chol 125 - 200 mg/dL 160 - - 153 -  HDL >=46 mg/dL 67 - - 52 -  Calc LDL <130 mg/dL 79 - - 81 -  Triglycerides <150 mg/dL 72 - - 99 -  Creatinine 0.50 - 1.10 mg/dL 0.89 0.74 0.79 0.81 0.92  GFR >60.00 mL/min - - - - -   BP/Weight 11/13/2015 09/16/2015 06/06/2015 04/17/2015 04/02/2015 03/07/2015 123456  Systolic BP A999333 94 A999333 123XX123 [id number] A999333 123456  Diastolic BP 64 60 68 62 75 66 70  Wt. (Lbs) 212 211 208 215.1 212 220.12 226  BMI 29.58 29.44 29.02 30.01 29.58 30.71 31.53   Foot/eye exam completion dates Latest Ref Rng 02/19/2015 01/07/2015  Eye Exam No Retinopathy No Retinopathy -  Foot Form Completion - - Done

## 2015-11-17 NOTE — Assessment & Plan Note (Signed)
No current flare, usese med as needed

## 2015-11-17 NOTE — Assessment & Plan Note (Signed)
Rate controlled and stable

## 2015-11-17 NOTE — Assessment & Plan Note (Signed)
Controlled, no change in medication DASH diet and commitment to daily physical activity for a minimum of 30 minutes discussed and encouraged, as a part of hypertension management. The importance of attaining a healthy weight is also discussed.  BP/Weight 11/13/2015 09/16/2015 06/06/2015 04/17/2015 04/02/2015 03/07/2015 123456  Systolic BP A999333 94 A999333 123XX123 [id number] 123456  Diastolic BP 64 60 68 62 75 66 70  Wt. (Lbs) 212 211 208 215.1 212 220.12 226  BMI 29.58 29.44 29.02 30.01 29.58 30.71 31.53

## 2015-11-17 NOTE — Assessment & Plan Note (Signed)
Deteriorated. Patient re-educated about  the importance of commitment to a  minimum of 150 minutes of exercise per week.  The importance of healthy food choices with portion control discussed. Encouraged to start a food diary, count calories and to consider  joining a support group. Sample diet sheets offered. Goals set by the patient for the next several months.   Weight /BMI 11/13/2015 09/16/2015 06/06/2015  WEIGHT 212 lb 211 lb 208 lb  HEIGHT 5\' 11"  5\' 11"  5\' 11"   BMI 29.58 kg/m2 29.44 kg/m2 29.02 kg/m2    Current exercise per week 90 minutes.

## 2015-11-17 NOTE — Assessment & Plan Note (Signed)
Hyperlipidemia:Low fat diet discussed and encouraged.   Lipid Panel  Lab Results  Component Value Date   CHOL 160 09/21/2015   HDL 67 09/21/2015   LDLCALC 79 09/21/2015   TRIG 72 09/21/2015   CHOLHDL 2.4 09/21/2015   Controlled, no change in medication

## 2015-12-04 NOTE — Addendum Note (Signed)
Addended by: Denman George B on: 12/04/2015 08:56 AM   Modules accepted: Orders

## 2016-01-02 ENCOUNTER — Other Ambulatory Visit: Payer: Self-pay | Admitting: Family Medicine

## 2016-01-02 ENCOUNTER — Other Ambulatory Visit: Payer: Self-pay

## 2016-01-02 ENCOUNTER — Encounter: Payer: Self-pay | Admitting: Family Medicine

## 2016-01-02 MED ORDER — HYDROCHLOROTHIAZIDE 12.5 MG PO CAPS: 12.5000 mg | ORAL_CAPSULE | Freq: Every day | ORAL | Status: DC

## 2016-01-18 ENCOUNTER — Emergency Department (HOSPITAL_COMMUNITY)
Admission: EM | Admit: 2016-01-18 | Discharge: 2016-01-18 | Disposition: A | Discharge status: Home / Self Care | Payer: 59 | Attending: Emergency Medicine | Admitting: Emergency Medicine

## 2016-01-18 ENCOUNTER — Encounter (HOSPITAL_COMMUNITY): Payer: Self-pay | Admitting: Emergency Medicine

## 2016-01-18 DIAGNOSIS — K0889 Other specified disorders of teeth and supporting structures: Secondary | ICD-10-CM | POA: Diagnosis not present

## 2016-01-18 DIAGNOSIS — Z7984 Long term (current) use of oral hypoglycemic drugs: Secondary | ICD-10-CM | POA: Diagnosis not present

## 2016-01-18 DIAGNOSIS — R51 Headache: Secondary | ICD-10-CM | POA: Insufficient documentation

## 2016-01-18 DIAGNOSIS — R519 Headache, unspecified: Secondary | ICD-10-CM

## 2016-01-18 DIAGNOSIS — Z79899 Other long term (current) drug therapy: Secondary | ICD-10-CM | POA: Diagnosis not present

## 2016-01-18 DIAGNOSIS — E119 Type 2 diabetes mellitus without complications: Secondary | ICD-10-CM | POA: Insufficient documentation

## 2016-01-18 DIAGNOSIS — Z791 Long term (current) use of non-steroidal anti-inflammatories (NSAID): Secondary | ICD-10-CM | POA: Insufficient documentation

## 2016-01-18 DIAGNOSIS — I10 Essential (primary) hypertension: Secondary | ICD-10-CM | POA: Diagnosis not present

## 2016-01-18 DIAGNOSIS — Z87891 Personal history of nicotine dependence: Secondary | ICD-10-CM | POA: Diagnosis not present

## 2016-01-18 DIAGNOSIS — E785 Hyperlipidemia, unspecified: Secondary | ICD-10-CM | POA: Insufficient documentation

## 2016-01-18 MED ORDER — PROCHLORPERAZINE EDISYLATE 5 MG/ML IJ SOLN
10.0000 mg | Freq: Once | INTRAMUSCULAR | Status: AC
  Administered 2016-01-18: 10 mg via INTRAMUSCULAR
  Filled 2016-01-18: qty 2

## 2016-01-18 MED ORDER — KETOROLAC TROMETHAMINE 60 MG/2ML IM SOLN
60.0000 mg | Freq: Once | INTRAMUSCULAR | Status: AC
  Administered 2016-01-18: 60 mg via INTRAMUSCULAR
  Filled 2016-01-18: qty 2

## 2016-01-18 NOTE — Discharge Instructions (Signed)

## 2016-01-18 NOTE — ED Notes (Signed)
Pt states light and sound sensitivity started today. Reports N/, no V/. A & O X4.

## 2016-01-18 NOTE — ED Triage Notes (Addendum)
Patient c/o right upper dental pain. Per patient had deep cleaning to teeth on Tuesday which lead to the pain. Patient given Ultram and Motrin in which she has been taking with no relief. Per patient has antibiotic called into pharmacy but has not went to pick it up yet. Patient states dental pain has now triggered her to have a migraine with nausea and sensitivity to light. Denies any vomitting. Patient reports hx of migraines-stating "I haven't had one in years."

## 2016-01-18 NOTE — ED Provider Notes (Signed)
[facility] DEPT Provider Note   CSN: [id number] Arrival date & time: 01/18/16  1219  First Provider Contact:   First MD Initiated Contact with Patient 01/18/16 1236     By signing my name below, I, Eustaquio Maize, attest that this documentation has been prepared under the direction and in the presence of Noemi Chapel, MD. Electronically Signed: Eustaquio Maize, ED Scribe. 01/18/16. 12:43 PM   History   Chief Complaint Chief Complaint  Patient presents with  . Dental Pain    HPI [name] is a 45 y.o. female with PMHx migraine headache who presents to the Emergency Department complaining of gradual onset, constant, throbbing, frontal headache that began this morning. Pt also complains of gradual onset, constant, right upper dental pain x 5 days. She reports that she had a deep cleaning of her teeth 5 days ago at the dentist's office and has been having pain since then. Pt mentions having right sided facial swelling as well. She called her dentist's office who called in an antibiotic yesterday but pt has not been able to pick it up yet. She cannot recall the name of the antibiotic. She also complains of nausea and has not been eating very much today due to the nausea. Pt states this headache feels similar to previous migraine headaches. Denies fever, vomiting, visual changes, or any other associated symptoms.   The history is provided by the patient. No language interpreter was used.    Past Medical History:  Diagnosis Date  . Essential hypertension, benign   . Migraine headache 2007  . Obesity    Since childhood   . Pneumonia 2007  . Spinal disease   . Supraventricular tachycardia Surgery [facility])    Status post RFA July 2015 - Dr. Lovena Le  . Type 2 diabetes mellitus ([place])   . Weight loss    Hopitalized 4 years ago after loosing 200lb on a supervised program, malnurished with severe protein deficiency     Patient Active Problem List   Diagnosis Date Noted  . Back pain with  sciatica 09/20/2014  . Solitary lung nodule 09/05/2014  . Hyperlipidemia LDL goal <100 01/26/2014  . Overweight (BMI 25.0-29.9) 01/26/2014  . Allergic rhinitis 09/08/2010  . Diabetes mellitus type 2 in obese ([place]) 05/17/2010  . OTH ABNORMAL BRAIN & CNS FUNCTION STUDY 02/16/2010  . GOITER, UNSPECIFIED 02/10/2010  . Essential hypertension, benign 05/02/2009  . SUPRAVENTRICULAR TACHYCARDIA 05/02/2009    Past Surgical History:  Procedure Laterality Date  . ABLATION  12-28-2013   slow pathway modification of AVNRT by Dr Lovena Le  . ELECTROPHYSIOLOGY STUDY N/A 12/28/2013   Procedure: ELECTROPHYSIOLOGY STUDY;  Surgeon: Evans Lance, MD;  Location[facility] CATH LAB;  Service: Cardiovascular;  Laterality: N/A;  . SUPRAVENTRICULAR TACHYCARDIA ABLATION N/A 12/28/2013   Procedure: SUPRAVENTRICULAR TACHYCARDIA ABLATION;  Surgeon: Evans Lance, MD;  Location[facility] C[facility] LAB;  Service: Cardiovascular;  Laterality: N/A;  . TUBAL LIGATION  1996    OB History    No data available       Home Medications    Prior to Admission medications   Medication Sig Start Date End Date Taking? Authorizing Provider  acetaminophen (TYLENOL) 500 MG tablet Take 1,000 mg by mouth every 6 (six) hours as needed (for pain).    Yes Historical Provider, MD  CRESTOR 5 MG tablet TAKE 1 TABLET BY MOUTH ONCE DAILY AT BEDTIME 01/08/15  Yes Fayrene Helper, MD  fluticasone [facility]) 50 MCG/ACT nasal spray Place 2 sprays into both nostrils  daily as needed for allergies or rhinitis.   Yes Historical Provider, MD  hydrochlorothiazide (MICROZIDE) 12.5 MG capsule Take 1 capsule (12.5 mg total) by mouth daily. 01/02/16  Yes Fayrene Helper, MD  ibuprofen (ADVIL,MOTRIN) 200 MG tablet Take 400 mg by mouth every 6 (six) hours as needed for moderate pain.   Yes Historical Provider, MD  losartan (COZAAR) 50 MG tablet Take 1 tablet (50 mg total) by mouth daily. 09/16/15  Yes Fayrene Helper, MD  metFORMIN (GLUCOPHAGE XR) 500 MG 24 hr tablet  Take 1 tablet (500 mg total) by mouth daily with breakfast. 06/06/15  Yes Fayrene Helper, MD  methocarbamol (ROBAXIN) 500 MG tablet Take 1 tablet (500 mg total) by mouth 4 (four) times daily. Patient taking differently: Take 500 mg by mouth every 6 (six) hours as needed for muscle spasms.  04/02/15  Yes Hollace Kinnier Sofia, PA-C  gabapentin (NEURONTIN) 300 MG capsule One at bedtime as needed, for back pain radiatng to left thigh 09/20/14   Fayrene Helper, MD  Vitamin D, Ergocalciferol, (DRISDOL) 50000 units CAPS capsule Take 1 capsule (50,000 Units total) by mouth every 7 (seven) days. 09/25/15   Fayrene Helper, MD    Family History Family History  Problem Relation Age of Onset  . Hypertension Mother   . Diabetes Mother   . Cancer Mother     Uterine   . Heart failure Mother     CABG  . Cancer Father 28    Prostate   . Hypertension Father   . Bradycardia Father     Irregular heart beat   . Diabetes Brother   . Diabetes Brother   . Hypertension Brother   . Hypertension Brother     Social History Social History  Substance Use Topics  . Smoking status: Former Smoker    Packs/day: 0.50    Years: 7.00    Types: Cigarettes    Quit date: 08/05/2012  . Smokeless tobacco: Never Used     Comment: quit in 2014  . Alcohol use No     Allergies   Codeine and Metoclopramide hcl   Review of Systems Review of Systems  Constitutional: Negative for fever.  HENT: Positive for dental problem.   Gastrointestinal: Positive for nausea.  Musculoskeletal: Negative for neck stiffness.     Physical Exam Updated Vital Signs BP 132/87   Pulse (!) 55   Temp 98.4 F (36.9 C) (Oral)   Resp 16   Ht 5\' 11"  (1.803 m)   Wt 209 lb (94.8 kg)   LMP 01/18/2016   SpO2 98%   BMI 29.15 kg/m   Physical Exam  Constitutional: She appears well-developed and well-nourished.  HENT:  Head: Normocephalic and atraumatic.  Right upper last molar is tender to palpation. Tenderness over the  surrounding gingiva of same tooth.  No assymmetry to the face. No trismus or torticollis.   Eyes: Conjunctivae are normal. Right eye exhibits no discharge. Left eye exhibits no discharge.  Pulmonary/Chest: Effort normal. No respiratory distress.  Neurological: She is alert. Coordination normal.  Normal neuro  Neurologic exam:  Speech clear, pupils equal round reactive to light, extraocular movements intact  Normal peripheral visual fields Cranial nerves III through XII normal including no facial droop Follows commands, moves all extremities x4, normal strength to bilateral upper and lower extremities at all major muscle groups including grip Sensation normal to light touch and pinprick Coordination intact, no limb ataxia, finger-nose-finger normal Rapid alternating movements normal No  pronator drift Gait normal   Skin: Skin is warm and dry. No rash noted. She is not diaphoretic. No erythema.  Psychiatric: She has a normal mood and affect.  Nursing note and vitals reviewed.    ED Treatments / Results    DIAGNOSTIC STUDIES: Oxygen Saturation is 100% on RA, normal by my interpretation.    COORDINATION OF CARE: 12:43 PM-Discussed treatment plan which includes Toradol injection and Compazine injection with pt at bedside and pt agreed to plan.   Labs (all labs ordered are listed, but only abnormal results are displayed) Labs Reviewed - No data to display  Radiology No results found.  Procedures Procedures (including critical care time)  Medications Ordered in ED Medications  ketorolac (TORADOL) injection 60 mg (60 mg Intramuscular Given 01/18/16 1247)  prochlorperazine (COMPAZINE) injection 10 mg (10 mg Intramuscular Given 01/18/16 1248)     Initial Impression / Assessment and Plan / ED Course  I have reviewed the triage vital signs and the nursing notes.  Pertinent labs & imaging results that were available during my care of the patient were reviewed by me and considered  in my medical decision making (see chart for details).  Clinical Course  Comment By Time  Reexamined, no pain, states that she is completely resolved her symptoms after medication Toradol and Compazine intramuscular. She is stable for discharge and will pick up her antibiotic on the way home Noemi Chapel, MD 07/29 1419    I personally performed the services described in this documentation, which was scribed in my presence. The recorded information has been reviewed and is accurate.      Final Clinical Impressions(s) / ED Diagnoses   Final diagnoses:  Nonintractable headache, unspecified chronicity pattern, unspecified headache type  Pain, dental    New Prescriptions New Prescriptions   No medications on file     Noemi Chapel, MD 01/18/16 1421

## 2016-02-05 ENCOUNTER — Other Ambulatory Visit: Payer: Self-pay

## 2016-02-05 ENCOUNTER — Other Ambulatory Visit: Payer: Self-pay | Admitting: Family Medicine

## 2016-02-05 ENCOUNTER — Encounter: Payer: Self-pay | Admitting: Family Medicine

## 2016-02-05 DIAGNOSIS — E669 Obesity, unspecified: Principal | ICD-10-CM

## 2016-02-05 DIAGNOSIS — E1169 Type 2 diabetes mellitus with other specified complication: Secondary | ICD-10-CM

## 2016-02-05 MED ORDER — METFORMIN HCL ER 500 MG PO TB24: 500.0000 mg | ORAL_TABLET | Freq: Every day | ORAL | 1 refills | Status: DC

## 2016-02-25 DIAGNOSIS — H524 Presbyopia: Secondary | ICD-10-CM | POA: Diagnosis not present

## 2016-02-25 DIAGNOSIS — E119 Type 2 diabetes mellitus without complications: Secondary | ICD-10-CM | POA: Diagnosis not present

## 2016-02-25 LAB — HM DIABETES EYE EXAM

## 2016-03-04 ENCOUNTER — Other Ambulatory Visit: Payer: Self-pay | Admitting: Family Medicine

## 2016-03-04 ENCOUNTER — Encounter: Payer: Self-pay | Admitting: Family Medicine

## 2016-03-04 DIAGNOSIS — R928 Other abnormal and inconclusive findings on diagnostic imaging of breast: Secondary | ICD-10-CM

## 2016-03-25 IMAGING — DX DG HIP (WITH OR WITHOUT PELVIS) 2-3V*L*
3 series · 4 of 4 positions shown · non-contrast
Comparison: None.

CLINICAL DATA: Low back pain and left hip and leg pain.

EXAM:
LEFT HIP (WITH PELVIS) 2-3 VIEWS

[Series 1: pelvis ap · 0.14mm/px · 2 of 2 slices shown]
[im 1/2]
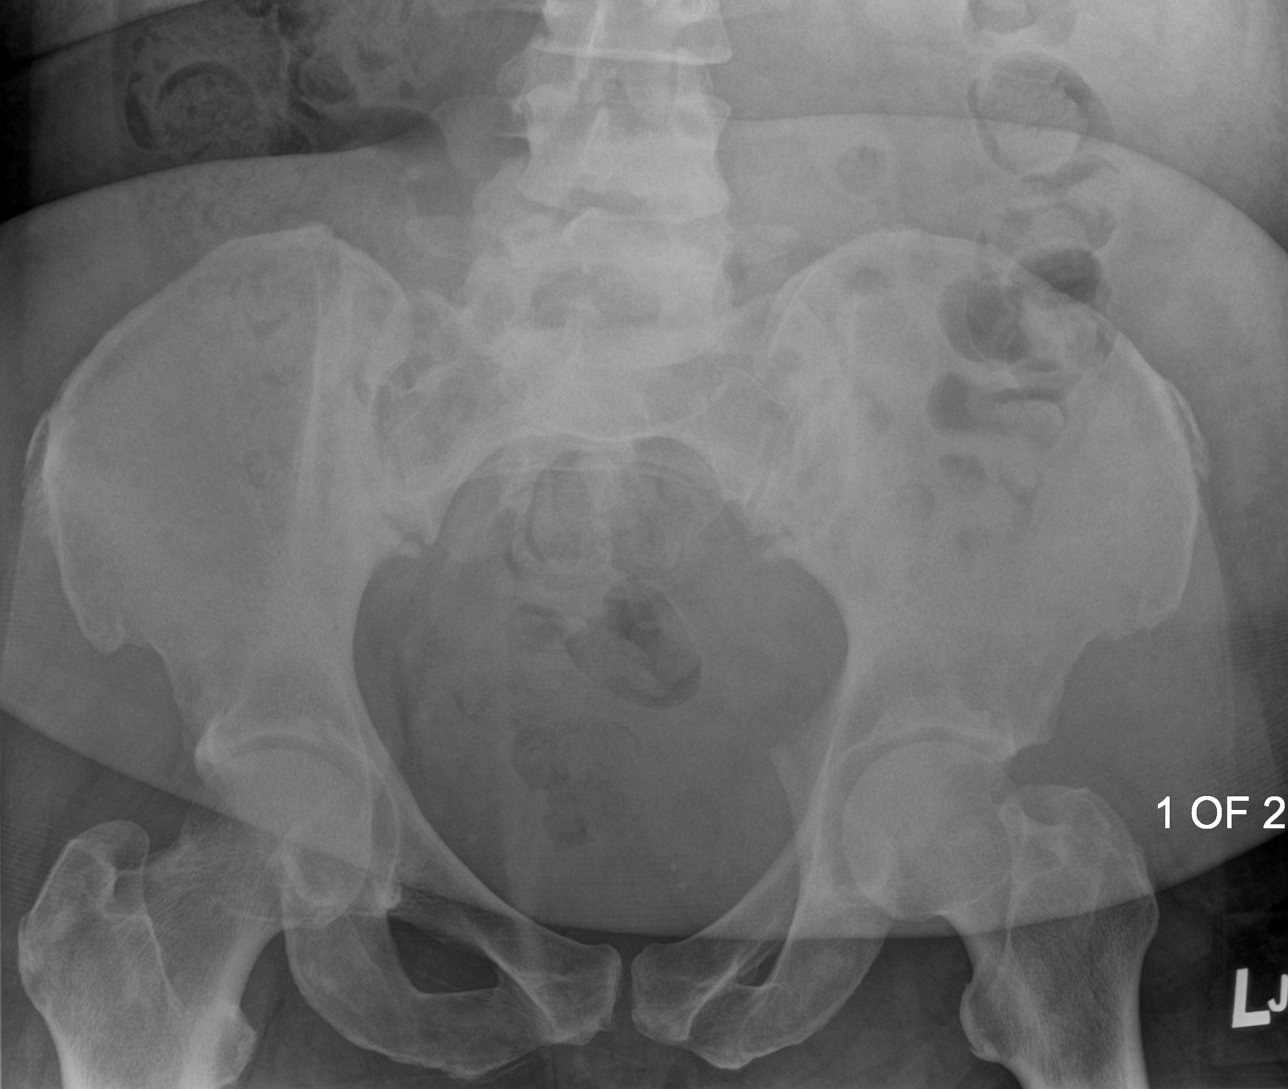
[im 2/2]
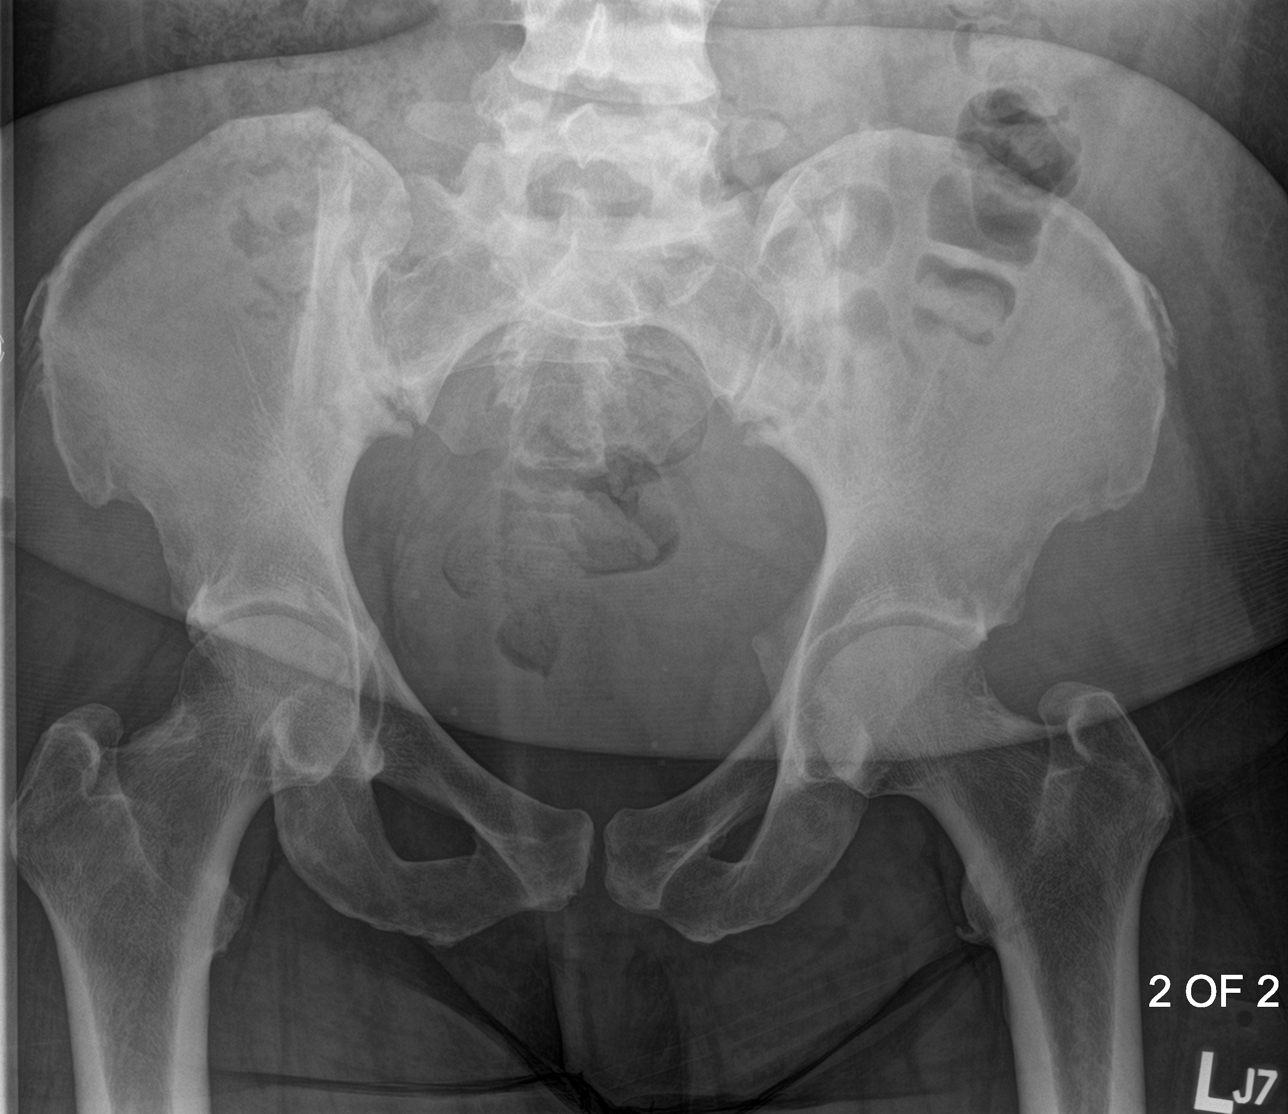

[hip ap]
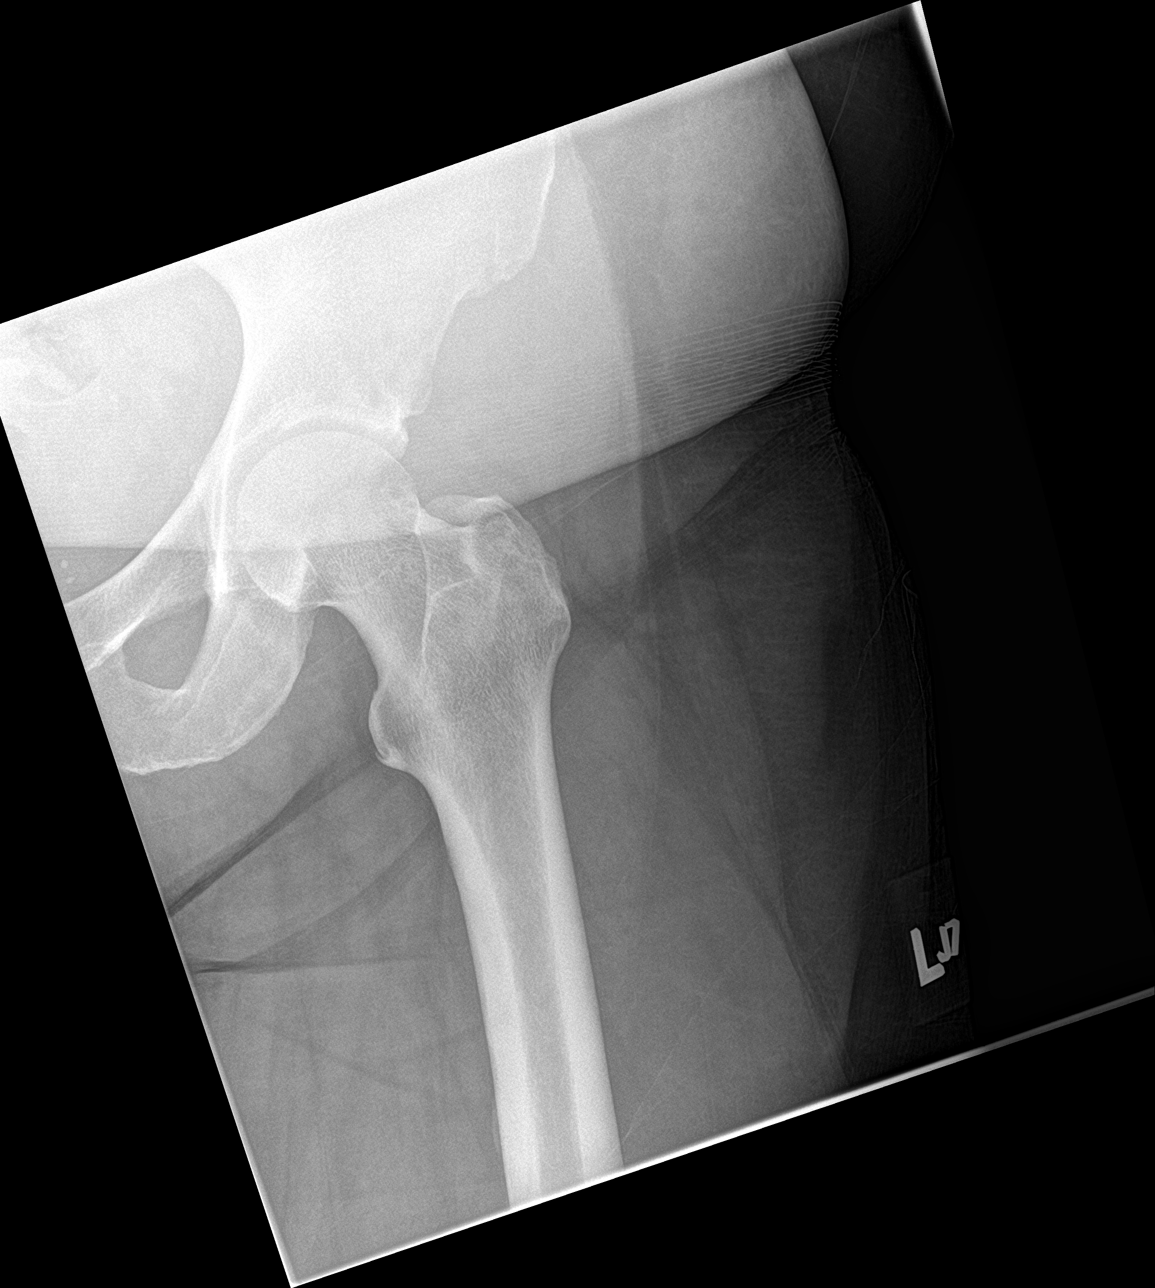

[hip lat]
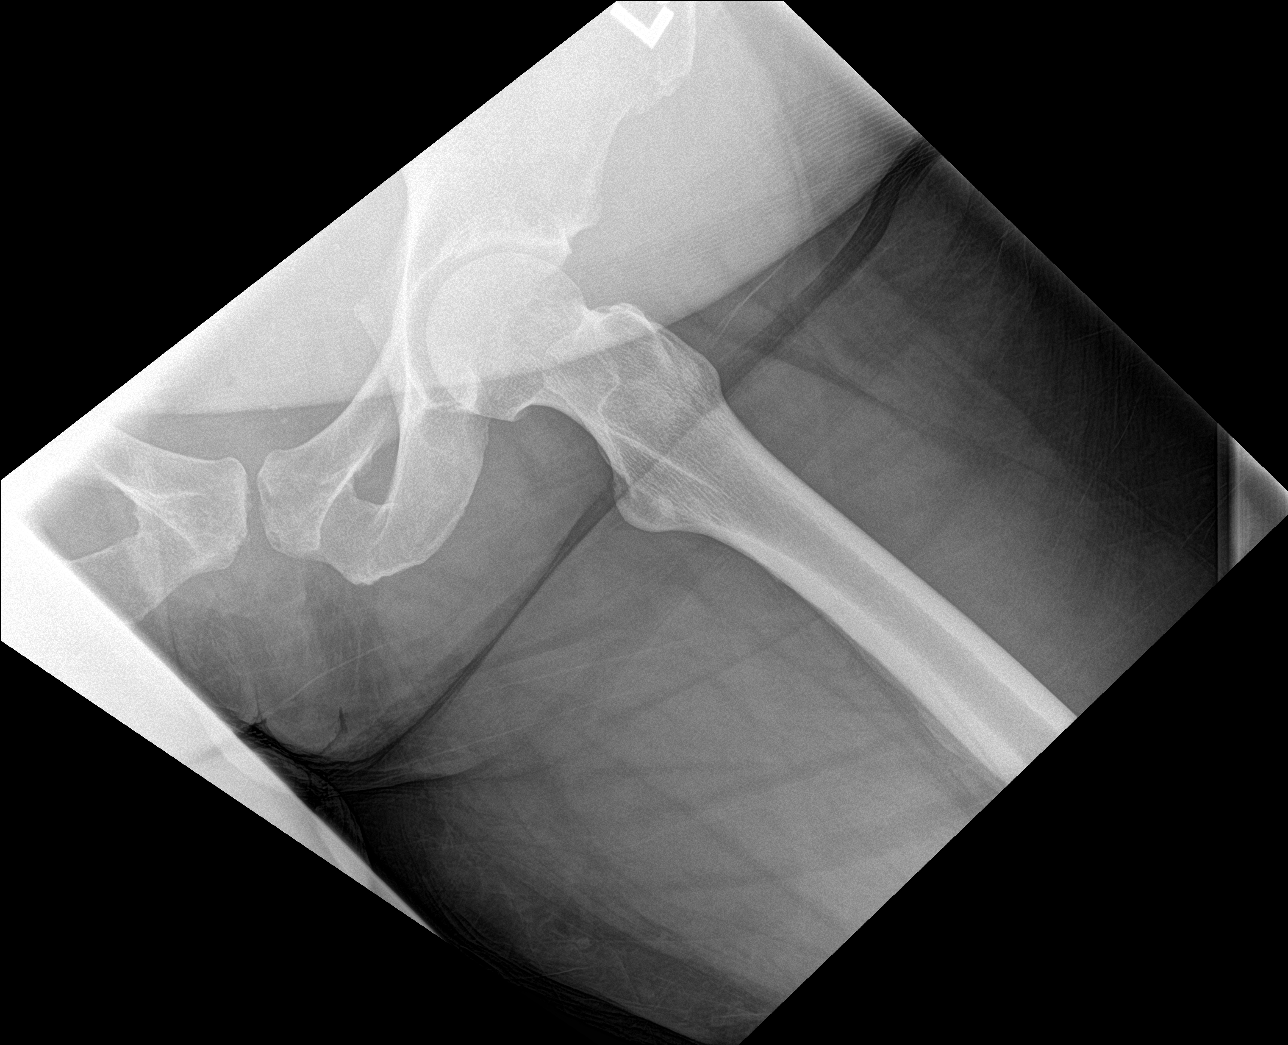

[4 of 4 positions shown; findings below may reference images not displayed]

FINDINGS: There is no fracture, dislocation, joint space narrowing, or other
significant abnormality.
IMPRESSION: Normal exam.

## 2016-03-27 ENCOUNTER — Telehealth: Payer: Self-pay | Admitting: Family Medicine

## 2016-03-27 NOTE — Telephone Encounter (Addendum)
Izora Gala is calling stating that [facility] mammo orders need to be changed to Diagnostic Rt Breast Tomo and U/S RT Breast Limited, Diagnosis is : Follow Up in Comments: Nodule at [address] in [place]

## 2016-03-30 ENCOUNTER — Telehealth: Payer: Self-pay | Admitting: Family Medicine

## 2016-03-30 DIAGNOSIS — Z09 Encounter for follow-up examination after completed treatment for conditions other than malignant neoplasm: Secondary | ICD-10-CM

## 2016-03-30 NOTE — Telephone Encounter (Signed)
Orders placed.

## 2016-03-30 NOTE — Telephone Encounter (Signed)
Imaging entered  

## 2016-03-30 NOTE — Telephone Encounter (Signed)
[facility] mammo needs to be changed to MM Diag Brst Tomo Rt and U/S Rt Breast Limited please advise?

## 2016-04-01 ENCOUNTER — Encounter: Payer: Self-pay | Admitting: Family Medicine

## 2016-04-01 ENCOUNTER — Other Ambulatory Visit: Payer: Self-pay | Admitting: Family Medicine

## 2016-04-02 ENCOUNTER — Other Ambulatory Visit: Payer: Self-pay

## 2016-04-02 MED ORDER — HYDROCHLOROTHIAZIDE 12.5 MG PO CAPS: 12.5000 mg | ORAL_CAPSULE | Freq: Every day | ORAL | 1 refills | Status: DC

## 2016-04-07 ENCOUNTER — Ambulatory Visit (HOSPITAL_COMMUNITY)
Admission: RE | Admit: 2016-04-07 | Discharge: 2016-04-07 | Disposition: A | Discharge status: Home / Self Care | Payer: 59 | Source: Ambulatory Visit | Attending: Family Medicine | Admitting: Family Medicine

## 2016-04-07 ENCOUNTER — Encounter (HOSPITAL_COMMUNITY): Payer: 59

## 2016-04-07 DIAGNOSIS — N63 Unspecified lump in unspecified breast: Secondary | ICD-10-CM | POA: Diagnosis not present

## 2016-04-07 DIAGNOSIS — Z09 Encounter for follow-up examination after completed treatment for conditions other than malignant neoplasm: Secondary | ICD-10-CM | POA: Diagnosis not present

## 2016-04-07 DIAGNOSIS — N6310 Unspecified lump in the right breast, unspecified quadrant: Secondary | ICD-10-CM | POA: Diagnosis not present

## 2016-04-20 DIAGNOSIS — E559 Vitamin D deficiency, unspecified: Secondary | ICD-10-CM | POA: Diagnosis not present

## 2016-04-20 DIAGNOSIS — E119 Type 2 diabetes mellitus without complications: Secondary | ICD-10-CM | POA: Diagnosis not present

## 2016-04-20 DIAGNOSIS — E785 Hyperlipidemia, unspecified: Secondary | ICD-10-CM | POA: Diagnosis not present

## 2016-04-20 DIAGNOSIS — E669 Obesity, unspecified: Secondary | ICD-10-CM | POA: Diagnosis not present

## 2016-04-20 DIAGNOSIS — I10 Essential (primary) hypertension: Secondary | ICD-10-CM | POA: Diagnosis not present

## 2016-04-20 LAB — CBC
HCT: 37.5 % (ref 35.0–45.0)
Hemoglobin: 12.3 g/dL (ref 11.7–15.5)
MCH: 28.3 pg (ref 27.0–33.0)
MCHC: 32.8 g/dL (ref 32.0–36.0)
MCV: 86.4 fL (ref 80.0–100.0)
MPV: 9.6 fL (ref 7.5–12.5)
PLATELETS: 283 10*3/uL (ref 140–400)
RBC: 4.34 MIL/uL (ref 3.80–5.10)
RDW: 14.5 % (ref 11.0–15.0)
WBC: 4.4 10*3/uL (ref 3.8–10.8)

## 2016-04-21 LAB — MICROALBUMIN / CREATININE URINE RATIO
Creatinine, Urine: 227 mg/dL (ref 20–320)
MICROALB/CREAT RATIO: 2 ug/mg{creat} (ref <30)
Microalb, Ur: 0.5 mg/dL

## 2016-04-21 LAB — COMPLETE METABOLIC PANEL WITH GFR
ALBUMIN: 3.9 g/dL (ref 3.6–5.1)
ALK PHOS: 38 U/L (ref 33–115)
ALT: 7 U/L (ref 6–29)
AST: 12 U/L (ref 10–35)
BILIRUBIN TOTAL: 0.3 mg/dL (ref 0.2–1.2)
BUN: 11 mg/dL (ref 7–25)
CALCIUM: 8.8 mg/dL (ref 8.6–10.2)
CO2: 27 mmol/L (ref 20–31)
CREATININE: 0.85 mg/dL (ref 0.50–1.10)
Chloride: 105 mmol/L (ref 98–110)
GFR, Est African American: >89 mL/min (ref >=60)
GFR, Est Non African American: 82 mL/min (ref >=60)
GLUCOSE: 103 mg/dL — AB (ref 65–99)
POTASSIUM: 3.6 mmol/L (ref 3.5–5.3)
SODIUM: 140 mmol/L (ref 135–146)
TOTAL PROTEIN: 6.6 g/dL (ref 6.1–8.1)

## 2016-04-21 LAB — HEMOGLOBIN A1C
Hgb A1c MFr Bld: 5.5 % (ref <5.7)
Mean Plasma Glucose: 111 mg/dL

## 2016-04-21 LAB — VITAMIN D 25 HYDROXY (VIT D DEFICIENCY, FRACTURES): VIT D 25 HYDROXY: 34 ng/mL (ref 30–100)

## 2016-04-22 ENCOUNTER — Ambulatory Visit: Payer: 59 | Admitting: Family Medicine

## 2016-04-29 ENCOUNTER — Encounter: Payer: Self-pay | Admitting: Family Medicine

## 2016-04-29 ENCOUNTER — Ambulatory Visit (INDEPENDENT_AMBULATORY_CARE_PROVIDER_SITE_OTHER): Payer: 59 | Admitting: Family Medicine

## 2016-04-29 VITALS — BP 112/78 | HR 79 | Resp 15 | Ht 73.0 in | Wt 198.0 lb

## 2016-04-29 DIAGNOSIS — E1169 Type 2 diabetes mellitus with other specified complication: Secondary | ICD-10-CM

## 2016-04-29 DIAGNOSIS — R102 Pelvic and perineal pain: Secondary | ICD-10-CM | POA: Diagnosis not present

## 2016-04-29 DIAGNOSIS — I10 Essential (primary) hypertension: Secondary | ICD-10-CM | POA: Diagnosis not present

## 2016-04-29 DIAGNOSIS — E785 Hyperlipidemia, unspecified: Secondary | ICD-10-CM

## 2016-04-29 DIAGNOSIS — E669 Obesity, unspecified: Secondary | ICD-10-CM

## 2016-04-29 DIAGNOSIS — E663 Overweight: Secondary | ICD-10-CM

## 2016-04-29 NOTE — Assessment & Plan Note (Addendum)
Corrected. D/c metformin [name] is reminded of the importance of commitment to daily physical activity for 30 minutes or more, as able and the need to limit carbohydrate intake to 30 to 60 grams per meal to help with blood sugar control.   [name] is reminded of the importance of daily foot exam, annual eye examination, and good blood sugar, blood pressure and cholesterol control.  Diabetic Labs Latest Ref Rng & Units 04/20/2016 09/21/2015 06/03/2015 01/03/2015 09/03/2014  HbA1c <5.7 % 5.5 6.0(H) 5.8(H) 6.1(H) 5.9(H)  Microalbumin Not estab mg/dL 0.5 - - 0.3 -  Micro/Creat Ratio <30 mcg/mg creat 2 - - 2.8 -  Chol 125 - 200 mg/dL - 160 - - 153  HDL >=46 mg/dL - 67 - - 52  Calc LDL <130 mg/dL - 79 - - 81  Triglycerides <150 mg/dL - 72 - - 99  Creatinine 0.50 - 1.10 mg/dL 0.85 0.89 0.74 0.79 0.81  GFR >60.00 mL/min - - - - -   BP/Weight 04/29/2016 01/18/2016 04/02/2015 09/19/2014 09/05/2014 05/02/2014 [id number]  Systolic BP XX123456 A999333 [id number] XX123456 123456 96 82  Diastolic BP 78 73 75 54 66 70 60  Wt. (Lbs) 198 209 212 241 243.12 252.8 273  BMI 26.12 29.15 29.58 33.63 32.08 35.27 38.09  Some encounter information is confidential and restricted. Go to Review Flowsheets activity to see all data.   Foot/eye exam completion dates Latest Ref Rng & Units 04/29/2016 02/25/2016  Eye Exam No Retinopathy - No Retinopathy  Foot Form Completion - Done -  Some encounter information is confidential and restricted. Go to Review Flowsheets activity to see all data.

## 2016-04-29 NOTE — Assessment & Plan Note (Signed)
Hyperlipidemia:Low fat diet discussed and encouraged.   Lipid Panel  Lab Results  Component Value Date   CHOL 160 09/21/2015   HDL 67 09/21/2015   LDLCALC 79 09/21/2015   TRIG 72 09/21/2015   CHOLHDL 2.4 09/21/2015   D/c crestor Updated lab needed at/ before next visit.

## 2016-04-29 NOTE — Assessment & Plan Note (Signed)
Improved. Pt applauded on succesful weight loss through lifestyle change, and encouraged to continue same. Weight loss goal set for the next several months.  

## 2016-04-29 NOTE — Assessment & Plan Note (Signed)
Controlled, no change in medication DASH diet and commitment to daily physical activity for a minimum of 30 minutes discussed and encouraged, as a part of hypertension management. The importance of attaining a healthy weight is also discussed.  BP/Weight 04/29/2016 01/18/2016 04/02/2015 09/19/2014 09/05/2014 05/02/2014 [id number]  Systolic BP XX123456 A999333 [id number] XX123456 123456 96 82  Diastolic BP 78 73 75 54 66 70 60  Wt. (Lbs) 198 209 212 241 243.12 252.8 273  BMI 26.12 29.15 29.58 33.63 32.08 35.27 38.09  Some encounter information is confidential and restricted. Go to Review Flowsheets activity to see all data.

## 2016-04-29 NOTE — Assessment & Plan Note (Signed)
Controlled rate, clinically stable

## 2016-04-29 NOTE — Assessment & Plan Note (Signed)
New pelvic pain in pt with h/o endometrial cancer in her mother, recurrent, refer for pelvic US

## 2016-04-29 NOTE — Progress Notes (Signed)
[name]     MRN: [id number]      DOB: [birth year]   HPI [name] is here for follow up and re-evaluation of chronic medical conditions, medication management and review of any available recent lab and radiology data.  Preventive health is updated, specifically  Cancer screening and Immunization.   Questions or concerns regarding consultations or procedures which the PT has had in the interim are  addressed. The PT denies any adverse reactions to current medications since the last visit.  4 month h/o pelvic pain intermittently, reports regular menses, burying her mother this week  Due to recurrent endometrial cancer Denies polyuria, polydipsia, blurred vision , or hypoglycemic episodes. Committed to healthy lifestyle with ongoing weight loss and improved bloodwork  ROS Denies recent fever or chills. Denies sinus pressure, nasal congestion, ear pain or sore throat. Denies chest congestion, productive cough or wheezing. Denies chest pains, palpitations and leg swelling Denies abdominal pain, nausea, vomiting,diarrhea or constipation.   Denies dysuria, frequency, hesitancy or incontinence. Denies joint pain, swelling and limitation in mobility. Denies headaches, seizures, numbness, or tingling. Denies uncontrolled  depression, anxiety or insomnia. Denies skin break down or rash.   PE  BP 112/78   Pulse 79   Resp 15   Ht 6\' 1"  (1.854 m)   Wt 198 lb (89.8 kg)   SpO2 99%   BMI 26.12 kg/m   Patient alert and oriented and in no cardiopulmonary distress.  HEENT: No facial asymmetry, EOMI,   oropharynx pink and moist.  Neck supple no JVD, no mass.  Chest: Clear to auscultation bilaterally.  CVS: S1, S2 no murmurs, no S3.Regular rate.  ABD: Soft non tender. No palpable abdominal mass  Ext: No edema  MS: Adequate ROM spine, shoulders, hips and knees.  Skin: Intact, no ulcerations or rash noted.  Psych: Good eye contact, normal affect. Memory intact not anxious or  depressed appearing.  CNS: CN 2-12 intact, power,  normal throughout.no focal deficits noted.   Assessment & Plan  Essential hypertension, benign Controlled, no change in medication DASH diet and commitment to daily physical activity for a minimum of 30 minutes discussed and encouraged, as a part of hypertension management. The importance of attaining a healthy weight is also discussed.  BP/Weight 04/29/2016 01/18/2016 04/02/2015 09/19/2014 09/05/2014 05/02/2014 [id number]  Systolic BP XX123456 A999333 [id number] XX123456 123456 96 82  Diastolic BP 78 73 75 54 66 70 60  Wt. (Lbs) 198 209 212 241 243.12 252.8 273  BMI 26.12 29.15 29.58 33.63 32.08 35.27 38.09  Some encounter information is confidential and restricted. Go to Review Flowsheets activity to see all data.       SUPRAVENTRICULAR TACHYCARDIA Controlled rate, clinically stable  Diabetes mellitus type 2 in obese Corrected. D/c metformin [name] is reminded of the importance of commitment to daily physical activity for 30 minutes or more, as able and the need to limit carbohydrate intake to 30 to 60 grams per meal to help with blood sugar control.   [name] is reminded of the importance of daily foot exam, annual eye examination, and good blood sugar, blood pressure and cholesterol control.  Diabetic Labs Latest Ref Rng & Units 04/20/2016 09/21/2015 06/03/2015 01/03/2015 09/03/2014  HbA1c <5.7 % 5.5 6.0(H) 5.8(H) 6.1(H) 5.9(H)  Microalbumin Not estab mg/dL 0.5 - - 0.3 -  Micro/Creat Ratio <30 mcg/mg creat 2 - - 2.8 -  Chol 125 - 200 mg/dL - 160 - - 153  HDL >=46 mg/dL -  67 - - 52  Calc LDL <130 mg/dL - 79 - - 81  Triglycerides <150 mg/dL - 72 - - 99  Creatinine 0.50 - 1.10 mg/dL 0.85 0.89 0.74 0.79 0.81  GFR >60.00 mL/min - - - - -   BP/Weight 04/29/2016 01/18/2016 04/02/2015 09/19/2014 09/05/2014 05/02/2014 [id number]  Systolic BP XX123456 A999333 [id number] XX123456 123456 96 82  Diastolic BP 78 73 75 54 66 70 60  Wt. (Lbs) 198 209 212 241 243.12 252.8 273  BMI  26.12 29.15 29.58 33.63 32.08 35.27 38.09  Some encounter information is confidential and restricted. Go to Review Flowsheets activity to see all data.   Foot/eye exam completion dates Latest Ref Rng & Units 04/29/2016 02/25/2016  Eye Exam No Retinopathy - No Retinopathy  Foot Form Completion - Done -  Some encounter information is confidential and restricted. Go to Review Flowsheets activity to see all data.        Hyperlipidemia LDL goal <100 Hyperlipidemia:Low fat diet discussed and encouraged.   Lipid Panel  Lab Results  Component Value Date   CHOL 160 09/21/2015   HDL 67 09/21/2015   LDLCALC 79 09/21/2015   TRIG 72 09/21/2015   CHOLHDL 2.4 09/21/2015   D/c crestor Updated lab needed at/ before next visit.     Overweight (BMI 25.0-29.9) Improved. Pt applauded on succesful weight loss through lifestyle change, and encouraged to continue same. Weight loss goal set for the next several months.   Pelvic pain in female New pelvic pain in pt with h/o endometrial cancer in her mother, recurrent, refer for pelvic US

## 2016-04-29 NOTE — Patient Instructions (Addendum)
F/u in 4.5 month, call if you need me ebfore  D/C crestor and metformin  Condolence re Mom's passing  You need rept mammogram in April  Good foot exam  You are referred for pelvic US re pelvic pain and f/h endometrial cancer  Fasting lipid, cmp and egfr, HBa1C, TSh and vit D in 4.5 months  Thank you  for choosing [facility]. We consider it a privelige to serve you.  Delivering excellent health care in a caring and  compassionate way is our goal.  Partnering with you,  so that together we can achieve this goal is our strategy.

## 2016-05-05 ENCOUNTER — Ambulatory Visit (HOSPITAL_COMMUNITY)
Admission: RE | Admit: 2016-05-05 | Discharge: 2016-05-05 | Disposition: A | Discharge status: Home / Self Care | Payer: 59 | Source: Ambulatory Visit | Attending: Family Medicine | Admitting: Family Medicine

## 2016-05-05 DIAGNOSIS — R102 Pelvic and perineal pain: Secondary | ICD-10-CM | POA: Insufficient documentation

## 2016-05-05 DIAGNOSIS — N859 Noninflammatory disorder of uterus, unspecified: Secondary | ICD-10-CM | POA: Insufficient documentation

## 2016-05-05 DIAGNOSIS — N858 Other specified noninflammatory disorders of uterus: Secondary | ICD-10-CM | POA: Diagnosis not present

## 2016-05-06 ENCOUNTER — Encounter: Payer: Self-pay | Admitting: Family Medicine

## 2016-05-06 ENCOUNTER — Other Ambulatory Visit: Payer: Self-pay

## 2016-05-06 DIAGNOSIS — I10 Essential (primary) hypertension: Secondary | ICD-10-CM

## 2016-05-06 MED ORDER — LOSARTAN POTASSIUM 50 MG PO TABS: 50.0000 mg | ORAL_TABLET | Freq: Every day | ORAL | 1 refills | Status: DC

## 2016-08-01 IMAGING — US US BREAST*R* LIMITED INC AXILLA
1 series · 6 of 6 positions shown · non-contrast
Comparison: 09/03/2014 and earlier

CLINICAL DATA: Patient returns after screening study for evaluation
of possible right breast asymmetry.

EXAM:
2D DIGITAL DIAGNOSTIC RIGHT MAMMOGRAM WITH ADJUNCT TOMO
ULTRASOUND RIGHT BREAST

[Series 1: us breast*right* limited inc axilla · 0.07mm/px · 6 of 6 slices shown]
[im 1/6]
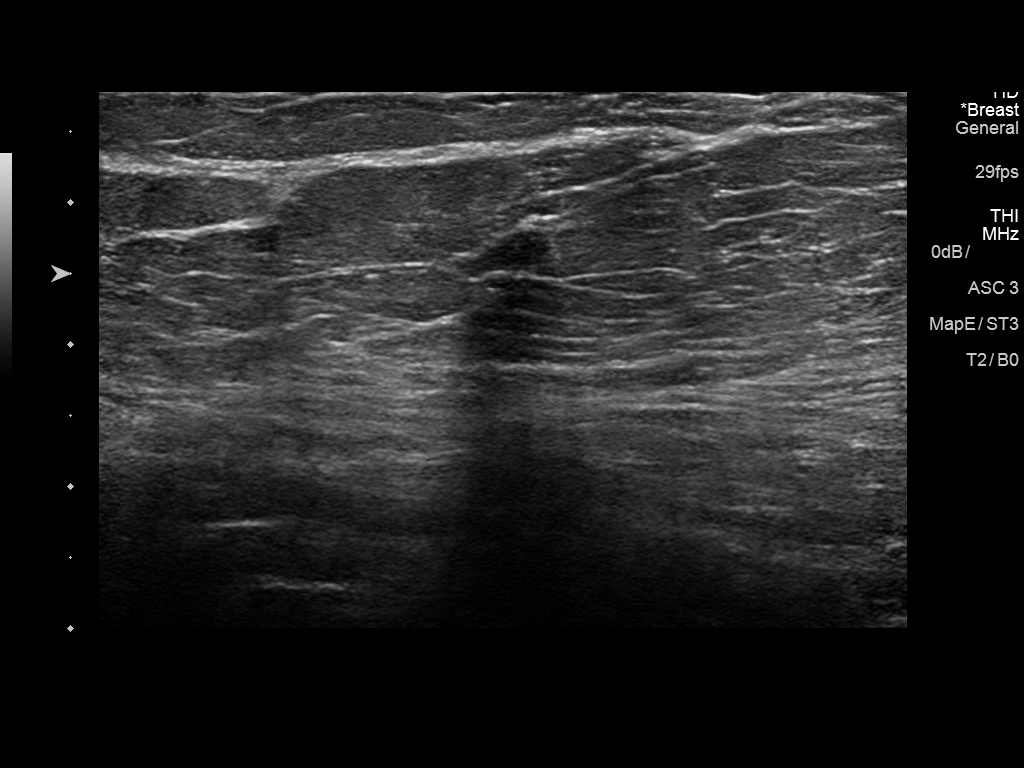
[im 2/6]
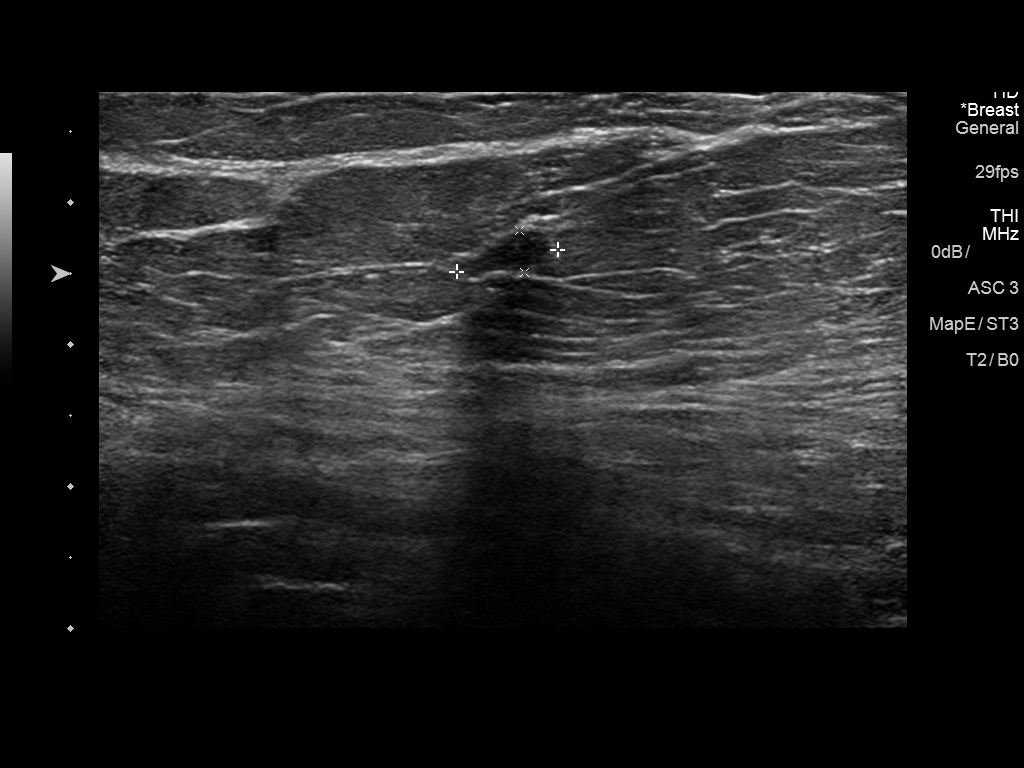
[im 3/6]
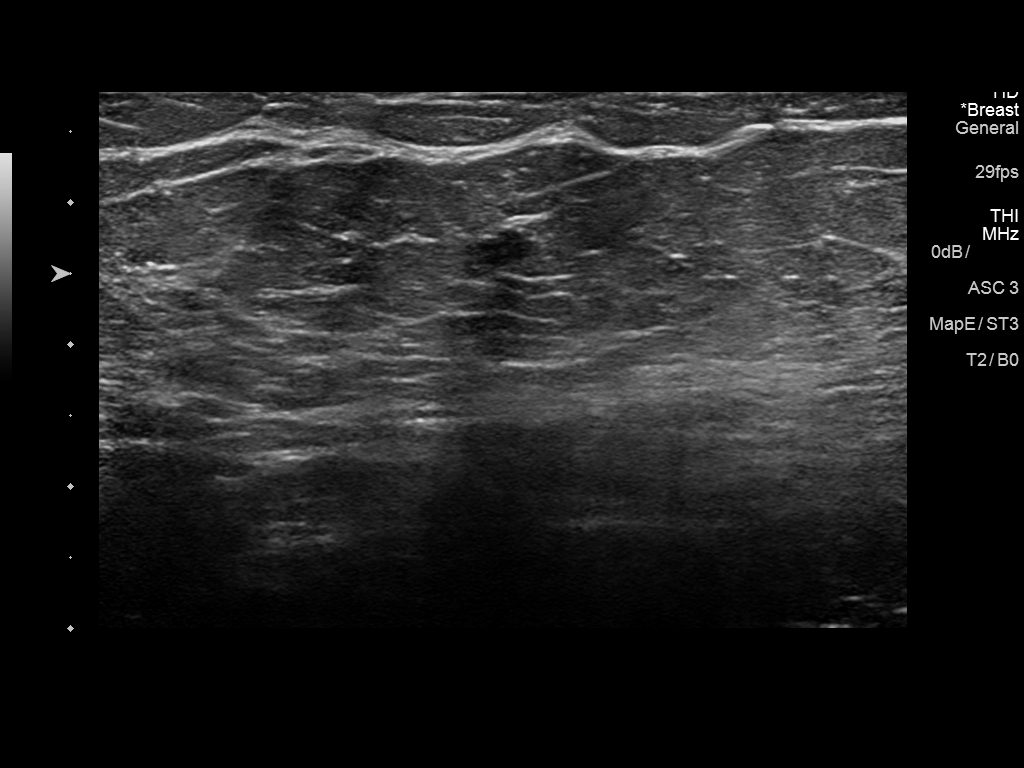
[im 4/6]
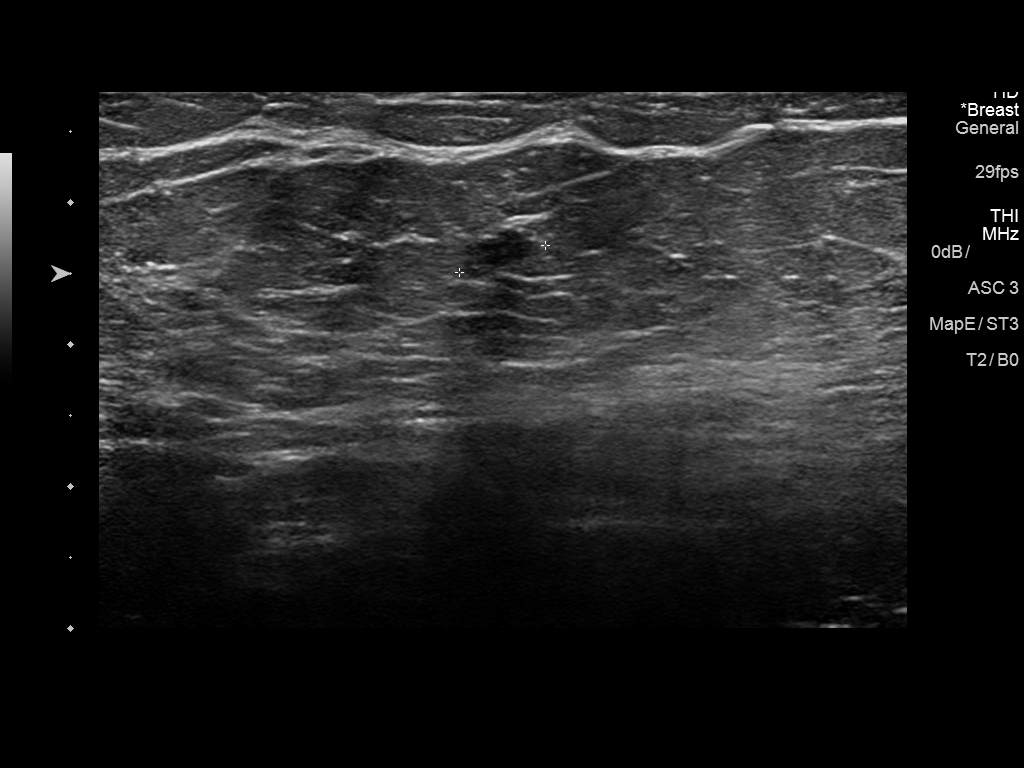
[im 5/6]
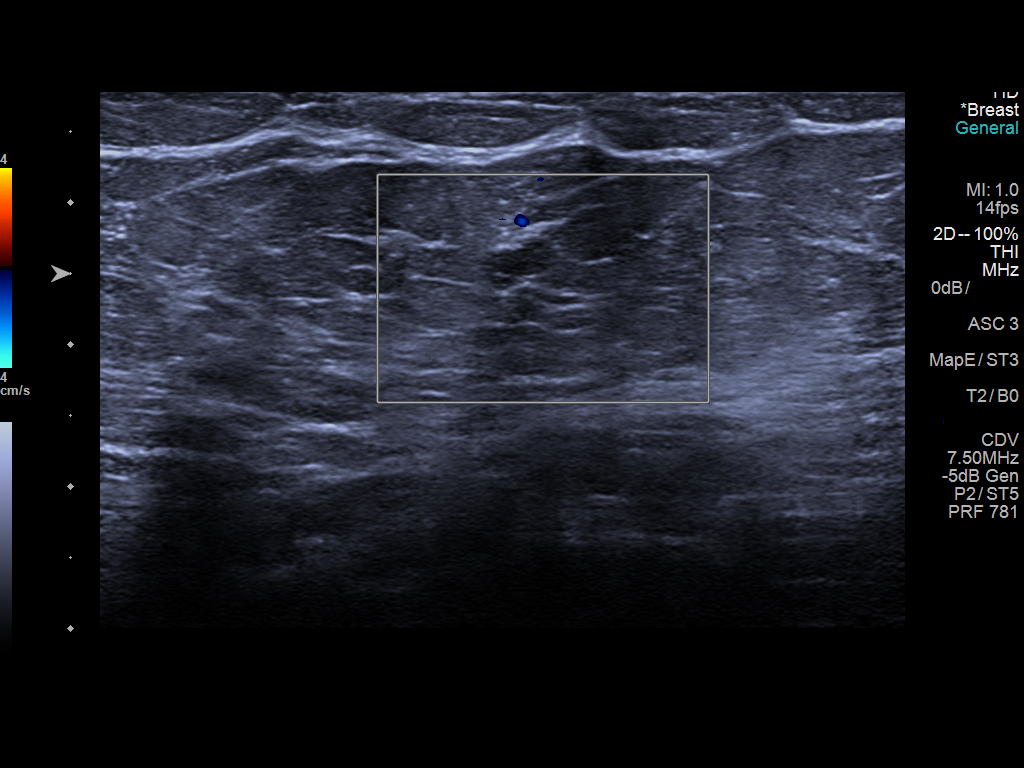
[im 6/6]
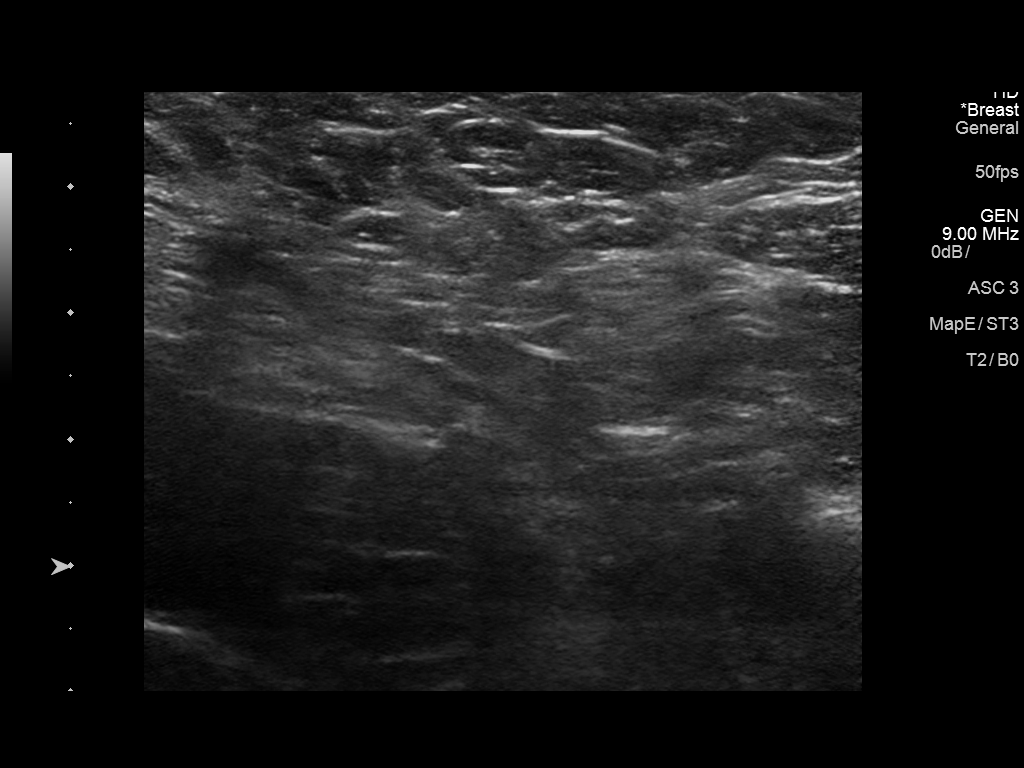

[6 of 6 positions shown; findings below may reference images not displayed]

ACR Breast Density Category b: There are scattered areas of
fibroglandular density.
FINDINGS: Additional views are performed, confirming presence of a small
nodule in the upper central portion of the right breast.

On physical exam, I palpate no abnormality in the upper central
portion of the right breast.

Targeted ultrasound is performed, showing a hypoechoic oval parallel
mass in the 12 o'clock location of the right breast 10 cm from the
nipple. This measures 0.7 x 0.3 x 0.6 cm. There is associated faint
acoustic shadowing. Margins are slightly irregular. No internal
blood flow identified by Doppler evaluation. Evaluation of the
axilla is negative for adenopathy.
IMPRESSION: 1. Indeterminate small nodule in the 12 o'clock location of the
right breast.
2. Biopsy is recommended.

RECOMMENDATION:
Ultrasound-guided core biopsy is recommended and scheduled for the
patient at her convenience.

I have discussed the findings and recommendations with the patient.
Results were also provided in writing at the conclusion of the
visit. If applicable, a reminder letter will be sent to the patient
regarding the next appointment.

BI-RADS CATEGORY  4: Suspicious.

## 2016-08-14 ENCOUNTER — Encounter: Payer: Self-pay | Admitting: Family Medicine

## 2016-08-20 ENCOUNTER — Encounter: Payer: Self-pay | Admitting: Family Medicine

## 2016-08-22 DIAGNOSIS — E785 Hyperlipidemia, unspecified: Secondary | ICD-10-CM | POA: Diagnosis not present

## 2016-08-22 DIAGNOSIS — E669 Obesity, unspecified: Secondary | ICD-10-CM | POA: Diagnosis not present

## 2016-08-22 DIAGNOSIS — E1169 Type 2 diabetes mellitus with other specified complication: Secondary | ICD-10-CM | POA: Diagnosis not present

## 2016-08-22 DIAGNOSIS — I10 Essential (primary) hypertension: Secondary | ICD-10-CM | POA: Diagnosis not present

## 2016-08-23 LAB — COMPREHENSIVE METABOLIC PANEL
ALBUMIN: 4.2 g/dL (ref 3.6–5.1)
ALK PHOS: 38 U/L (ref 33–115)
ALT: 10 U/L (ref 6–29)
AST: 14 U/L (ref 10–35)
BILIRUBIN TOTAL: 0.4 mg/dL (ref 0.2–1.2)
BUN: 15 mg/dL (ref 7–25)
CALCIUM: 9.3 mg/dL (ref 8.6–10.2)
CO2: 26 mmol/L (ref 20–31)
Chloride: 105 mmol/L (ref 98–110)
Creat: 0.94 mg/dL (ref 0.50–1.10)
Glucose, Bld: 84 mg/dL (ref 65–99)
POTASSIUM: 4.2 mmol/L (ref 3.5–5.3)
Sodium: 138 mmol/L (ref 135–146)
Total Protein: 7 g/dL (ref 6.1–8.1)

## 2016-08-23 LAB — LIPID PANEL
CHOL/HDL RATIO: 2.2 ratio (ref <5.0)
CHOLESTEROL: 170 mg/dL (ref <200)
HDL: 76 mg/dL (ref >50)
LDL Cholesterol: 82 mg/dL (ref <100)
TRIGLYCERIDES: 58 mg/dL (ref <150)
VLDL: 12 mg/dL (ref <30)

## 2016-08-23 LAB — TSH: TSH: 0.99 m[IU]/L

## 2016-08-24 LAB — HEMOGLOBIN A1C
Hgb A1c MFr Bld: 5.3 % (ref <5.7)
MEAN PLASMA GLUCOSE: 105 mg/dL

## 2016-08-24 LAB — VITAMIN D 25 HYDROXY (VIT D DEFICIENCY, FRACTURES): VIT D 25 HYDROXY: 38 ng/mL (ref 30–100)

## 2016-08-25 ENCOUNTER — Other Ambulatory Visit: Payer: Self-pay

## 2016-08-25 ENCOUNTER — Encounter: Payer: Self-pay | Admitting: Family Medicine

## 2016-08-25 ENCOUNTER — Ambulatory Visit (INDEPENDENT_AMBULATORY_CARE_PROVIDER_SITE_OTHER): Payer: 59 | Admitting: Family Medicine

## 2016-08-25 VITALS — BP 104/80 | HR 62 | Resp 15 | Ht 73.0 in | Wt 201.0 lb

## 2016-08-25 DIAGNOSIS — J301 Allergic rhinitis due to pollen: Secondary | ICD-10-CM

## 2016-08-25 DIAGNOSIS — M5430 Sciatica, unspecified side: Secondary | ICD-10-CM

## 2016-08-25 DIAGNOSIS — E785 Hyperlipidemia, unspecified: Secondary | ICD-10-CM | POA: Diagnosis not present

## 2016-08-25 DIAGNOSIS — I10 Essential (primary) hypertension: Secondary | ICD-10-CM

## 2016-08-25 DIAGNOSIS — R928 Other abnormal and inconclusive findings on diagnostic imaging of breast: Secondary | ICD-10-CM | POA: Diagnosis not present

## 2016-08-25 DIAGNOSIS — Z8639 Personal history of other endocrine, nutritional and metabolic disease: Secondary | ICD-10-CM | POA: Diagnosis not present

## 2016-08-25 DIAGNOSIS — M549 Dorsalgia, unspecified: Secondary | ICD-10-CM

## 2016-08-25 DIAGNOSIS — E663 Overweight: Secondary | ICD-10-CM

## 2016-08-25 MED ORDER — GLUCOSE BLOOD VI STRP: ORAL_STRIP | 3 refills | Status: DC

## 2016-08-25 MED ORDER — ACCU-CHEK FASTCLIX LANCETS MISC: 3 refills | Status: DC

## 2016-08-25 NOTE — Patient Instructions (Addendum)
Physical with pap in 5.5 months, call if you need me before  Please sched f/u right diagnostic breast mammogram due April 17 or after  We will send for once weekly test strips for [facility] program despite the fact that you are not diabetic , per program outline.  CONGRATS on improved health and excellent / NORMAL blood sugar and excellent cholesterol  Start once daily Vit D3 1000 IU after you finish weekly vit D

## 2016-08-25 NOTE — Progress Notes (Signed)
Cc chek

## 2016-08-27 ENCOUNTER — Encounter: Payer: Self-pay | Admitting: Family Medicine

## 2016-08-27 DIAGNOSIS — Z8639 Personal history of other endocrine, nutritional and metabolic disease: Secondary | ICD-10-CM | POA: Insufficient documentation

## 2016-08-27 NOTE — Assessment & Plan Note (Signed)
No current / recent flare

## 2016-08-27 NOTE — Progress Notes (Signed)
[name]     MRN: [id number]      DOB: [birth year]   HPI Ms. Malen Ortiz is here for follow up and re-evaluation of chronic medical conditions, medication management and review of any available recent lab and radiology data.  Preventive health is updated, specifically  Cancer screening and Immunization.   Questions or concerns regarding consultations or procedures which the PT has had in the interim are  addressed. The PT denies any adverse reactions to current medications since the last visit.  Requesting diabetic test stroips as she is enrolled in a wellness program which requires blood sugar testing despite the fact that she is no longer diabetic  ROS Denies recent fever or chills. Denies sinus pressure, nasal congestion, ear pain or sore throat. Denies chest congestion, productive cough or wheezing. Denies chest pains, palpitations and leg swelling Denies abdominal pain, nausea, vomiting,diarrhea or constipation.   Denies dysuria, frequency, hesitancy or incontinence. Denies joint pain, swelling and limitation in mobility. Denies headaches, seizures, numbness, or tingling. Denies depression, anxiety or insomnia. Denies skin break down or rash.   PE  BP 104/80   Pulse 62   Resp 15   Ht 6\' 1"  (1.854 m)   Wt 201 lb (91.2 kg)   SpO2 100%   BMI 26.52 kg/m   Patient alert and oriented and in no cardiopulmonary distress.  HEENT: No facial asymmetry, EOMI,   oropharynx pink and moist.  Neck supple no JVD, no mass.  Chest: Clear to auscultation bilaterally.  CVS: S1, S2 no murmurs, no S3.Regular rate.  ABD: Soft non tender.   Ext: No edema  MS: Adequate ROM spine, shoulders, hips and knees.  Skin: Intact, no ulcerations or rash noted.  Psych: Good eye contact, normal affect. Memory intact not anxious or depressed appearing.  CNS: CN 2-12 intact, power,  normal throughout.no focal deficits noted.   Assessment & Plan  Essential hypertension, benign Controlled,  no change in medication DASH diet and commitment to daily physical activity for a minimum of 30 minutes discussed and encouraged, as a part of hypertension management. The importance of attaining a healthy weight is also discussed.  BP/Weight 08/25/2016 04/29/2016 01/18/2016 04/02/2015 09/19/2014 09/05/2014 40/98/1191  Systolic BP 478 295 621 308 657 846 96  Diastolic BP 80 78 73 75 54 66 70  Wt. (Lbs) 201 198 209 212 241 243.12 252.8  BMI 26.52 26.12 29.15 29.58 33.63 32.08 35.27  Some encounter information is confidential and restricted. Go to Review Flowsheets activity to see all data.       Allergic rhinitis Recent flare with season change , needs to commit to daily medication  H/O diabetes mellitus Off medication with lifestyle change , which is excellent. Pt applauded on this   Overweight (BMI 25.0-29.9) Deteriorated. Patient re-educated about  the importance of commitment to a  minimum of 150 minutes of exercise per week.  The importance of healthy food choices with portion control discussed. Encouraged to start a food diary, count calories and to consider  joining a support group. Sample diet sheets offered. Goals set by the patient for the next several months.   Weight /BMI 08/25/2016 04/29/2016 01/18/2016  WEIGHT 201 lb 198 lb 209 lb  HEIGHT 6\' 1"  6\' 1"  5\' 11"   BMI 26.52 kg/m2 26.12 kg/m2 29.15 kg/m2  Some encounter information is confidential and restricted. Go to Review Flowsheets activity to see all data.      Hyperlipidemia LDL goal <100 Hyperlipidemia:Low fat diet discussed and  encouraged. Controlled with diet only  Lipid Panel  Lab Results  Component Value Date   CHOL 170 08/22/2016   HDL 76 08/22/2016   LDLCALC 82 08/22/2016   TRIG 58 08/22/2016   CHOLHDL 2.2 08/22/2016       Back pain with sciatica No current / recent flare

## 2016-08-27 NOTE — Assessment & Plan Note (Signed)
Recent flare with season change , needs to commit to daily medication

## 2016-08-27 NOTE — Assessment & Plan Note (Signed)
Controlled, no change in medication DASH diet and commitment to daily physical activity for a minimum of 30 minutes discussed and encouraged, as a part of hypertension management. The importance of attaining a healthy weight is also discussed.  BP/Weight 08/25/2016 04/29/2016 01/18/2016 04/02/2015 09/19/2014 09/05/2014 86/77/3736  Systolic BP 681 594 707 615 183 437 96  Diastolic BP 80 78 73 75 54 66 70  Wt. (Lbs) 201 198 209 212 241 243.12 252.8  BMI 26.52 26.12 29.15 29.58 33.63 32.08 35.27  Some encounter information is confidential and restricted. Go to Review Flowsheets activity to see all data.

## 2016-08-27 NOTE — Assessment & Plan Note (Signed)
Deteriorated. Patient re-educated about  the importance of commitment to a  minimum of 150 minutes of exercise per week.  The importance of healthy food choices with portion control discussed. Encouraged to start a food diary, count calories and to consider  joining a support group. Sample diet sheets offered. Goals set by the patient for the next several months.   Weight /BMI 08/25/2016 04/29/2016 01/18/2016  WEIGHT 201 lb 198 lb 209 lb  HEIGHT 6\' 1"  6\' 1"  5\' 11"   BMI 26.52 kg/m2 26.12 kg/m2 29.15 kg/m2  Some encounter information is confidential and restricted. Go to Review Flowsheets activity to see all data.

## 2016-08-27 NOTE — Assessment & Plan Note (Signed)
Hyperlipidemia:Low fat diet discussed and encouraged. Controlled with diet only  Lipid Panel  Lab Results  Component Value Date   CHOL 170 08/22/2016   HDL 76 08/22/2016   LDLCALC 82 08/22/2016   TRIG 58 08/22/2016   CHOLHDL 2.2 08/22/2016

## 2016-08-27 NOTE — Assessment & Plan Note (Signed)
Off medication with lifestyle change , which is excellent. Pt applauded on this

## 2016-09-30 ENCOUNTER — Other Ambulatory Visit: Payer: Self-pay | Admitting: Family Medicine

## 2016-09-30 ENCOUNTER — Other Ambulatory Visit: Payer: Self-pay

## 2016-09-30 ENCOUNTER — Encounter: Payer: Self-pay | Admitting: Family Medicine

## 2016-09-30 MED ORDER — HYDROCHLOROTHIAZIDE 12.5 MG PO CAPS: 12.5000 mg | ORAL_CAPSULE | Freq: Every day | ORAL | 0 refills | Status: DC

## 2016-10-01 ENCOUNTER — Other Ambulatory Visit: Payer: Self-pay | Admitting: Family Medicine

## 2016-10-01 DIAGNOSIS — N6310 Unspecified lump in the right breast, unspecified quadrant: Secondary | ICD-10-CM

## 2016-10-01 DIAGNOSIS — N6320 Unspecified lump in the left breast, unspecified quadrant: Secondary | ICD-10-CM

## 2016-10-13 ENCOUNTER — Inpatient Hospital Stay (HOSPITAL_COMMUNITY): Admission: RE | Admit: 2016-10-13 | Payer: 59 | Source: Ambulatory Visit

## 2016-10-21 ENCOUNTER — Ambulatory Visit (HOSPITAL_COMMUNITY)
Admission: RE | Admit: 2016-10-21 | Discharge: 2016-10-21 | Disposition: A | Discharge status: Home / Self Care | Payer: 59 | Source: Ambulatory Visit | Attending: Family Medicine | Admitting: Family Medicine

## 2016-10-21 DIAGNOSIS — N6310 Unspecified lump in the right breast, unspecified quadrant: Secondary | ICD-10-CM | POA: Insufficient documentation

## 2016-10-21 DIAGNOSIS — R928 Other abnormal and inconclusive findings on diagnostic imaging of breast: Secondary | ICD-10-CM | POA: Insufficient documentation

## 2016-11-02 ENCOUNTER — Encounter: Payer: Self-pay | Admitting: Family Medicine

## 2016-11-02 ENCOUNTER — Telehealth: Payer: Self-pay

## 2016-11-02 ENCOUNTER — Other Ambulatory Visit: Payer: Self-pay | Admitting: Family Medicine

## 2016-11-02 DIAGNOSIS — I10 Essential (primary) hypertension: Secondary | ICD-10-CM

## 2016-11-02 MED ORDER — LOSARTAN POTASSIUM 50 MG PO TABS: 50.0000 mg | ORAL_TABLET | Freq: Every day | ORAL | 1 refills | Status: DC

## 2016-11-02 NOTE — Telephone Encounter (Signed)
Done

## 2016-11-03 ENCOUNTER — Encounter (HOSPITAL_COMMUNITY): Payer: 59

## 2016-12-09 DIAGNOSIS — H04123 Dry eye syndrome of bilateral lacrimal glands: Secondary | ICD-10-CM | POA: Diagnosis not present

## 2016-12-29 ENCOUNTER — Encounter: Payer: Self-pay | Admitting: Family Medicine

## 2016-12-29 ENCOUNTER — Other Ambulatory Visit: Payer: Self-pay | Admitting: Family Medicine

## 2016-12-29 ENCOUNTER — Telehealth: Payer: Self-pay

## 2016-12-29 MED ORDER — HYDROCHLOROTHIAZIDE 12.5 MG PO CAPS: 12.5000 mg | ORAL_CAPSULE | Freq: Every day | ORAL | 1 refills | Status: DC

## 2016-12-29 NOTE — Telephone Encounter (Signed)
Seen 08/25/16.

## 2017-01-13 ENCOUNTER — Encounter: Payer: Self-pay | Admitting: Family Medicine

## 2017-01-13 ENCOUNTER — Other Ambulatory Visit: Payer: Self-pay | Admitting: Family Medicine

## 2017-01-13 ENCOUNTER — Telehealth: Payer: Self-pay | Admitting: Family Medicine

## 2017-01-13 DIAGNOSIS — R1011 Right upper quadrant pain: Secondary | ICD-10-CM

## 2017-01-13 NOTE — Telephone Encounter (Signed)
Please contact pt on her mobile phone, see pt message. I have spoken directly to her and have also sent a message. I have ordered an [place] of her RUQ, which needs to be  Done asap, I am concerned that she may have gallstones. I have also advised her that she will be given an OattaV with me next week , so pls provide her with appointment info, thanks

## 2017-01-18 ENCOUNTER — Ambulatory Visit (HOSPITAL_COMMUNITY)
Admission: RE | Admit: 2017-01-18 | Discharge: 2017-01-18 | Disposition: A | Discharge status: Home / Self Care | Payer: 59 | Source: Ambulatory Visit | Attending: Family Medicine | Admitting: Family Medicine

## 2017-01-18 ENCOUNTER — Encounter: Payer: Self-pay | Admitting: Family Medicine

## 2017-01-18 ENCOUNTER — Ambulatory Visit (INDEPENDENT_AMBULATORY_CARE_PROVIDER_SITE_OTHER): Payer: 59 | Admitting: Family Medicine

## 2017-01-18 VITALS — BP 138/62 | HR 56 | Temp 98.4°F | Resp 16 | Ht 71.0 in | Wt 201.2 lb

## 2017-01-18 DIAGNOSIS — I10 Essential (primary) hypertension: Secondary | ICD-10-CM

## 2017-01-18 DIAGNOSIS — E663 Overweight: Secondary | ICD-10-CM

## 2017-01-18 DIAGNOSIS — R1010 Upper abdominal pain, unspecified: Secondary | ICD-10-CM | POA: Diagnosis not present

## 2017-01-18 DIAGNOSIS — R1011 Right upper quadrant pain: Secondary | ICD-10-CM | POA: Insufficient documentation

## 2017-01-18 DIAGNOSIS — R1013 Epigastric pain: Secondary | ICD-10-CM | POA: Diagnosis not present

## 2017-01-18 MED ORDER — PANTOPRAZOLE SODIUM 20 MG PO TBEC: 20.0000 mg | DELAYED_RELEASE_TABLET | Freq: Two times a day (BID) | ORAL | 0 refills | Status: DC

## 2017-01-18 NOTE — Assessment & Plan Note (Signed)
recurrent bloating  Dyspepsia and nausea, normal gall bladder studies, needs H pylori testing , and trial of PPI

## 2017-01-18 NOTE — Assessment & Plan Note (Signed)
Deteriorated. Patient re-educated about  the importance of commitment to a  minimum of 150 minutes of exercise per week.  The importance of healthy food choices with portion control discussed. Encouraged to start a food diary, count calories and to consider  joining a support group. Sample diet sheets offered. Goals set by the patient for the next several months.   Weight /BMI 01/18/2017 08/25/2016 04/29/2016  WEIGHT 201 lb 4 oz 201 lb 198 lb  HEIGHT 5\' 11"  6\' 1"  6\' 1"   BMI 28.07 kg/m2 26.52 kg/m2 26.12 kg/m2  Some encounter information is confidential and restricted. Go to Review Flowsheets activity to see all data.

## 2017-01-18 NOTE — Assessment & Plan Note (Signed)
Controlled, no change in medication DASH diet and commitment to daily physical activity for a minimum of 30 minutes discussed and encouraged, as a part of hypertension management. The importance of attaining a healthy weight is also discussed.  BP/Weight 01/18/2017 08/25/2016 04/29/2016 01/18/2016 04/02/2015 09/19/2014 4/48/1856  Systolic BP 314 970 263 785 885 027 741  Diastolic BP 62 80 78 73 75 54 66  Wt. (Lbs) 201.25 201 198 209 212 241 243.12  BMI 28.07 26.52 26.12 29.15 29.58 33.63 32.08  Some encounter information is confidential and restricted. Go to Review Flowsheets activity to see all data.

## 2017-01-18 NOTE — Patient Instructions (Addendum)
Keep August appt  Breath test today for H pylori infection  AFTER test start protonix  And sTOP caffeine  Work on weight loss, esp central abdominal   Heartburn Heartburn is a type of pain or discomfort that can happen in the throat or chest. It is often described as a burning pain. It may also cause a bad taste in the mouth. Heartburn may feel worse when you lie down or bend over. It may be caused by stomach contents that move back up (reflux) into the tube that connects the mouth with the stomach (esophagus). Follow these instructions at home: Take these actions to lessen your discomfort and to help avoid problems. Diet  Follow a diet as told by your doctor. You may need to avoid foods and drinks such as: ? Coffee and tea (with or without caffeine). ? Drinks that contain alcohol. ? Energy drinks and sports drinks. ? Carbonated drinks or sodas. ? Chocolate and cocoa. ? Peppermint and mint flavorings. ? Garlic and onions. ? Horseradish. ? Spicy and acidic foods, such as peppers, chili powder, curry powder, vinegar, hot sauces, and BBQ sauce. ? Citrus fruit juices and citrus fruits, such as oranges, lemons, and limes. ? Tomato-based foods, such as red sauce, chili, salsa, and pizza with red sauce. ? Fried and fatty foods, such as donuts, french fries, potato chips, and high-fat dressings. ? High-fat meats, such as hot dogs, rib eye steak, sausage, ham, and bacon. ? High-fat dairy items, such as whole milk, butter, and cream cheese.  Eat small meals often. Avoid eating large meals.  Avoid drinking large amounts of liquid with your meals.  Avoid eating meals during the 2-3 hours before bedtime.  Avoid lying down right after you eat.  Do not exercise right after you eat. General instructions  Pay attention to any changes in your symptoms.  Take over-the-counter and prescription medicines only as told by your doctor. Do not take aspirin, ibuprofen, or other NSAIDs unless your  doctor says it is okay.  Do not use any tobacco products, including cigarettes, chewing tobacco, and e-cigarettes. If you need help quitting, ask your doctor.  Wear loose clothes. Do not wear anything tight around your waist.  Raise (elevate) the head of your bed about 6 inches (15 cm).  Try to lower your stress. If you need help doing this, ask your doctor.  If you are overweight, lose an amount of weight that is healthy for you. Ask your doctor about a safe weight loss goal.  Keep all follow-up visits as told by your doctor. This is important. Contact a doctor if:  You have new symptoms.  You lose weight and you do not know why it is happening.  You have trouble swallowing, or it hurts to swallow.  You have wheezing or a cough that keeps happening.  Your symptoms do not get better with treatment.  You have heartburn often for more than two weeks. Get help right away if:  You have pain in your arms, neck, jaw, teeth, or back.  You feel sweaty, dizzy, or light-headed.  You have chest pain or shortness of breath.  You throw up (vomit) and your throw up looks like blood or coffee grounds.  Your poop (stool) is bloody or black. This information is not intended to replace advice given to you by your health care provider. Make sure you discuss any questions you have with your health care provider. Document Released: 02/18/2011 Document Revised: 11/14/2015 Document Reviewed: 10/03/2014 Elsevier Interactive  Patient Education  [employer].

## 2017-01-18 NOTE — Progress Notes (Signed)
   [name]     MRN: [id number]      DOB: Jun 08, 1971   HPI [name] is here with a 2 month h/o intermittent rUQ abdominal pain and bloating, herr RUQ ultrasound earlier today is normal, which is great. Does confirm symptoms of GERD, and also intermittent solid dysphagia. She also reports excess caffeine  Use, which she understands and accepts the need to discontinue ROS Denies recent fever or chills. Denies sinus pressure, nasal congestion, ear pain or sore throat. Denies chest congestion, productive cough or wheezing. Denies chest pains, palpitations and leg swelling Denies ,diarrhea or constipation.   Denies dysuria, frequency, hesitancy or incontinence. Denies joint pain, swelling and limitation in mobility.    PE  BP 138/62 (BP Location: Left Arm, Patient Position: Sitting, Cuff Size: Normal)   Pulse (!) 56   Temp 98.4 F (36.9 C) (Other (Comment))   Resp 16   Ht 5\' 11"  (1.803 m)   Wt 201 lb 4 oz (91.3 kg)   LMP 01/04/2017 (Approximate)   SpO2 100%   BMI 28.07 kg/m   Patient alert and oriented and in no cardiopulmonary distress.  HEENT: No facial asymmetry, EOMI,   oropharynx pink and moist.  Neck supple no JVD, no mass.  Chest: Clear to auscultation bilaterally.  CVS: S1, S2 no murmurs, no S3.Regular rate.  ABD: Soft no localized tenderness, guarding or rebound , normal bS  Ext: No edema  MS: Adequate ROM spine, shoulders, hips and knees.  Skin: Intact, no ulcerations or rash noted.  Psych: Good eye contact, normal affect. Memory intact not anxious or depressed appearing.  CNS: CN 2-12 intact, power,  normal throughout.no focal deficits noted.   Assessment & Plan  Dyspepsia recurrent bloating  Dyspepsia and nausea, normal gall bladder studies, needs H pylori testing , and trial of PPI  Essential hypertension, benign Controlled, no change in medication DASH diet and commitment to daily physical activity for a minimum of 30 minutes discussed  and encouraged, as a part of hypertension management. The importance of attaining a healthy weight is also discussed.  BP/Weight 01/18/2017 08/25/2016 04/29/2016 01/18/2016 04/02/2015 09/19/2014 0/37/5436  Systolic BP 067 703 403 524 818 590 931  Diastolic BP 62 80 78 73 75 54 66  Wt. (Lbs) 201.25 201 198 209 212 241 243.12  BMI 28.07 26.52 26.12 29.15 29.58 33.63 32.08  Some encounter information is confidential and restricted. Go to Review Flowsheets activity to see all data.

## 2017-01-19 ENCOUNTER — Encounter: Payer: Self-pay | Admitting: Family Medicine

## 2017-01-19 LAB — H. PYLORI BREATH TEST: H. PYLORI BREATH TEST: NOT DETECTED

## 2017-02-10 ENCOUNTER — Ambulatory Visit (INDEPENDENT_AMBULATORY_CARE_PROVIDER_SITE_OTHER): Payer: 59 | Admitting: Family Medicine

## 2017-02-10 ENCOUNTER — Other Ambulatory Visit (HOSPITAL_COMMUNITY)
Admission: RE | Admit: 2017-02-10 | Discharge: 2017-02-10 | Disposition: A | Discharge status: Home / Self Care | Payer: 59 | Source: Ambulatory Visit | Attending: Family Medicine | Admitting: Family Medicine

## 2017-02-10 ENCOUNTER — Encounter: Payer: Self-pay | Admitting: Family Medicine

## 2017-02-10 VITALS — BP 120/64 | HR 58 | Temp 99.1°F | Resp 16 | Ht 71.0 in | Wt 206.2 lb

## 2017-02-10 DIAGNOSIS — I10 Essential (primary) hypertension: Secondary | ICD-10-CM | POA: Diagnosis not present

## 2017-02-10 DIAGNOSIS — Z1211 Encounter for screening for malignant neoplasm of colon: Secondary | ICD-10-CM | POA: Diagnosis not present

## 2017-02-10 DIAGNOSIS — Z0000 Encounter for general adult medical examination without abnormal findings: Secondary | ICD-10-CM

## 2017-02-10 DIAGNOSIS — Z124 Encounter for screening for malignant neoplasm of cervix: Secondary | ICD-10-CM | POA: Diagnosis not present

## 2017-02-10 LAB — POC HEMOCCULT BLD/STL (OFFICE/1-CARD/DIAGNOSTIC): Card #1 Date: NEGATIVE

## 2017-02-10 NOTE — Patient Instructions (Signed)
F./u end January, call if you need me before  Congrats on excellent health and lifestyle, keep it up  No med changes  Weight loss goal of 10 pounds  Thank you  for choosing [facility]. We consider it a privelige to serve you.  Delivering excellent health care in a caring and  compassionate way is our goal.  Partnering with you,  so that together we can achieve this goal is our strategy.

## 2017-02-10 NOTE — Progress Notes (Signed)
    [name]     MRN: [id number]      DOB: 1971-04-03  HPI: Patient is in for annual physical exam. No other health concerns are expressed or addressed at the visit.  Immunization is reviewed , will get flu vaccine through her job   PE: BP 120/64 (BP Location: Right Arm, Patient Position: Sitting, Cuff Size: Normal)   Pulse (!) 58   Temp 99.1 F (37.3 C)   Resp 16   Ht 5\' 11"  (1.803 m)   Wt 206 lb 4 oz (93.6 kg)   LMP 02/03/2017 (Approximate)   SpO2 96%   BMI 28.77 kg/m   Pleasant  female, alert and oriented x 3, in no cardio-pulmonary distress. Afebrile. HEENT No facial trauma or asymetry. Sinuses non tender.  Extra occullar muscles intact,  External ears normal, tympanic membranes clear. Oropharynx moist, no exudate. Neck: supple, no adenopathy,JVD or thyromegaly.No bruits.  Chest: Clear to ascultation bilaterally.No crackles or wheezes. Non tender to palpation  Breast: No asymetry,no masses or lumps. No tenderness. No nipple discharge or inversion. No axillary or supraclavicular adenopathy  Cardiovascular system; Heart sounds normal,  S1 and  S2 ,no S3.  No murmur, or thrill. Apical beat not displaced Peripheral pulses normal.  Abdomen: Soft, non tender, no organomegaly or masses. No bruits. Bowel sounds normal. No guarding, tenderness or rebound.  Rectal:  Normal sphincter tone. No rectal mass. Guaiac negative stool.  GU: External genitalia normal female genitalia , normal female distribution of hair. No lesions. Urethral meatus normal in size, no  Prolapse, no lesions visibly  Present. Bladder non tender. Vagina pink and moist , with no visible lesions , discharge present . Adequate pelvic support no  cystocele or rectocele noted Cervix pink and appears healthy, no lesions or ulcerations noted, no discharge noted from os Uterus normal size, no adnexal masses, no cervical motion or adnexal tenderness.   Musculoskeletal exam: Full ROM of  spine, hips , shoulders and knees. No deformity ,swelling or crepitus noted. No muscle wasting or atrophy.   Neurologic: Cranial nerves 2 to 12 intact. Power, tone ,sensation and reflexes normal throughout. No disturbance in gait. No tremor.  Skin: Intact, no ulceration, erythema , scaling or rash noted. Pigmentation normal throughout  Psych; Normal mood and affect. Judgement and concentration normal   Assessment & Plan:  Annual physical exam Annual exam as documented. Counseling done  re healthy lifestyle involving commitment to 150 minutes exercise per week, heart healthy diet, and attaining healthy weight.The importance of adequate sleep also discussed.  Immunization and cancer screening needs are specifically addressed at this visit.

## 2017-02-12 ENCOUNTER — Encounter: Payer: Self-pay | Admitting: Family Medicine

## 2017-02-12 NOTE — Assessment & Plan Note (Signed)
Annual exam as documented. Counseling done  re healthy lifestyle involving commitment to 150 minutes exercise per week, heart healthy diet, and attaining healthy weight.The importance of adequate sleep also discussed.  Immunization and cancer screening needs are specifically addressed at this visit.  

## 2017-02-15 ENCOUNTER — Encounter: Payer: Self-pay | Admitting: Family Medicine

## 2017-02-15 LAB — CYTOLOGY - PAP
ADEQUACY: ABSENT
Diagnosis: NEGATIVE
HPV (WINDOPATH): NOT DETECTED

## 2017-03-17 ENCOUNTER — Telehealth: Payer: Self-pay | Admitting: Family Medicine

## 2017-03-17 ENCOUNTER — Encounter: Payer: Self-pay | Admitting: Family Medicine

## 2017-03-17 NOTE — Telephone Encounter (Signed)
Patient left message stating that she is having a flare-up of back pain. She states when this happens, Dr. Moshe Cipro usually has 'the nurse' give her a shot of steroids then call in rx steroids and muscle relaxers. I called patient back to inform that Dr. Moshe Cipro is not in the office until next week. Unable to reach patient.  Is this something you are comfortable authorizing, or does she need to wait?

## 2017-03-17 NOTE — Telephone Encounter (Signed)
She would need to be seen to be evaluated. Kelsey Her. Mannie Stabile, MD

## 2017-03-17 NOTE — Telephone Encounter (Signed)
This was explained to patient via [place] communication

## 2017-03-18 ENCOUNTER — Emergency Department (HOSPITAL_COMMUNITY)
Admission: EM | Admit: 2017-03-18 | Discharge: 2017-03-18 | Disposition: A | Discharge status: Home / Self Care | Payer: 59 | Attending: Emergency Medicine | Admitting: Emergency Medicine

## 2017-03-18 ENCOUNTER — Encounter (HOSPITAL_COMMUNITY): Payer: Self-pay | Admitting: Emergency Medicine

## 2017-03-18 DIAGNOSIS — Z87891 Personal history of nicotine dependence: Secondary | ICD-10-CM | POA: Insufficient documentation

## 2017-03-18 DIAGNOSIS — Z79899 Other long term (current) drug therapy: Secondary | ICD-10-CM | POA: Diagnosis not present

## 2017-03-18 DIAGNOSIS — M545 Low back pain: Secondary | ICD-10-CM | POA: Diagnosis not present

## 2017-03-18 DIAGNOSIS — E119 Type 2 diabetes mellitus without complications: Secondary | ICD-10-CM | POA: Insufficient documentation

## 2017-03-18 DIAGNOSIS — M5441 Lumbago with sciatica, right side: Secondary | ICD-10-CM | POA: Insufficient documentation

## 2017-03-18 DIAGNOSIS — I10 Essential (primary) hypertension: Secondary | ICD-10-CM | POA: Diagnosis not present

## 2017-03-18 MED ORDER — PREDNISONE 10 MG (21) PO TBPK: ORAL_TABLET | Freq: Every day | ORAL | 0 refills | Status: DC

## 2017-03-18 MED ORDER — IBUPROFEN 600 MG PO TABS: 600.0000 mg | ORAL_TABLET | Freq: Four times a day (QID) | ORAL | 0 refills | Status: DC | PRN

## 2017-03-18 MED ORDER — METHYLPREDNISOLONE SODIUM SUCC 125 MG IJ SOLR
80.0000 mg | Freq: Once | INTRAMUSCULAR | Status: AC
  Administered 2017-03-18: 80 mg via INTRAMUSCULAR
  Filled 2017-03-18: qty 2

## 2017-03-18 MED ORDER — KETOROLAC TROMETHAMINE 30 MG/ML IJ SOLN
30.0000 mg | Freq: Once | INTRAMUSCULAR | Status: AC
  Administered 2017-03-18: 30 mg via INTRAMUSCULAR
  Filled 2017-03-18: qty 1

## 2017-03-18 MED ORDER — METHOCARBAMOL 500 MG PO TABS: 500.0000 mg | ORAL_TABLET | Freq: Three times a day (TID) | ORAL | 0 refills | Status: DC | PRN

## 2017-03-18 NOTE — Discharge Instructions (Signed)
Take medications as prescribed. Follow-up with your PCP.  Return for worsening symptoms, including difficulty walking, new numbness/weakness, escalating pain or any other symptoms concerning to you.

## 2017-03-18 NOTE — ED Triage Notes (Signed)
Pt c/o lower back pain since yesterday. H/s of same. Unable to get appointment with pcp today. Denies gi/gu sx.

## 2017-03-18 NOTE — ED Provider Notes (Signed)
[facility] DEPT Provider Note   CSN: [id number] Arrival date & time: 03/18/17  3748     History   Chief Complaint Chief Complaint  Patient presents with  . Back Pain    HPI [name] is a 46 y.o. female.  The history is provided by the patient.  Back Pain   This is a new problem. The current episode started yesterday. The problem occurs constantly. The problem has not changed since onset.The pain is associated with no known injury. The pain is present in the sacro-iliac joint (right paraspinal muscles of low back). The quality of the pain is described as aching. The pain radiates to the right thigh. The pain is moderate. The symptoms are aggravated by bending, twisting and certain positions. The pain is the same all the time. Pertinent negatives include no chest pain, no fever, no numbness, no abdominal pain, no bowel incontinence, no perianal numbness, no bladder incontinence, no dysuria, no pelvic pain, no paresthesias, no paresis, no tingling and no weakness. She has tried nothing for the symptoms. The treatment provided no relief.   46 year old female who presents with low back pain. Onset of right sided low back pain radiating to right thigh since waking up yesterday. History of arthritis of the back and states that it flares up everyone once in a while, and this feels exactly the same as previous flares. Usually steroid shot, steroid taper, muscle relaxants help but states her doctor is not in the office today to prescribe. No fall, injury, strain, or exertional activity. No numbness, weakness, difficulty walking.   Past Medical History:  Diagnosis Date  . Essential hypertension, benign   . Migraine headache 2007  . Obesity    Since childhood   . Pneumonia 2007  . Spinal disease   . Supraventricular tachycardia [facility])    Status post RFA July 2015 - Dr. Lovena Le  . Type 2 diabetes mellitus ([place])   . Weight loss    Hopitalized 4 years ago after loosing 200lb on a  supervised program, malnurished with severe protein deficiency     Patient Active Problem List   Diagnosis Date Noted  . Dyspepsia 01/18/2017  . H/O diabetes mellitus 08/27/2016  . Pelvic pain in female 04/29/2016  . Back pain with sciatica 09/20/2014  . Solitary lung nodule 09/05/2014  . Hyperlipidemia LDL goal <100 01/26/2014  . Overweight (BMI 25.0-29.9) 01/26/2014  . Annual physical exam 04/18/2012  . Allergic rhinitis 09/08/2010  . OTH ABNORMAL BRAIN & CNS FUNCTION STUDY 02/16/2010  . GOITER, UNSPECIFIED 02/10/2010  . Essential hypertension, benign 05/02/2009    Past Surgical History:  Procedure Laterality Date  . ABLATION  12-28-2013   slow pathway modification of AVNRT by Dr Lovena Le  . ELECTROPHYSIOLOGY STUDY N/A 12/28/2013   Procedure: ELECTROPHYSIOLOGY STUDY;  Surgeon: Evans Lance, MD;  Location: [facility] CATH LAB;  Service: Cardiovascular;  Laterality: N/A;  . SUPRAVENTRICULAR TACHYCARDIA ABLATION N/A 12/28/2013   Procedure: SUPRAVENTRICULAR TACHYCARDIA ABLATION;  Surgeon: Evans Lance, MD;  Location: [facility] CATH LAB;  Service: Cardiovascular;  Laterality: N/A;  . TUBAL LIGATION  1996    OB History    No data available       Home Medications    Prior to Admission medications   Medication Sig Start Date End Date Taking? Authorizing Provider  ACCU-CHEK FASTCLIX LANCETS [place] Once daily testing dx E11.9 08/25/16   Fayrene Helper, MD  acetaminophen (TYLENOL) 500 MG tablet Take 1,000 mg by mouth every  6 (six) hours as needed (for pain).     [provider]  Cholecalciferol (VITAMIN D-1000 MAX ST) 1000 units tablet Take by mouth.    [provider]  fluticasone (FLONASE) 50 MCG/ACT nasal spray Place 2 sprays into both nostrils daily as needed for allergies or rhinitis.    [provider]  glucose blood (ACCU-CHEK GUIDE) test strip Use as instructed once daily dx E11.9 08/25/16   Fayrene Helper, MD  hydrochlorothiazide (MICROZIDE) 12.5 MG capsule  Take 1 capsule (12.5 mg total) by mouth daily. 12/29/16   Fayrene Helper, MD  ibuprofen (ADVIL,MOTRIN) 200 MG tablet Take 400 mg by mouth every 6 (six) hours as needed for moderate pain.    [provider]  ibuprofen (ADVIL,MOTRIN) 600 MG tablet Take 1 tablet (600 mg total) by mouth every 6 (six) hours as needed. 03/18/17   Forde Dandy, MD  losartan (COZAAR) 50 MG tablet Take 1 tablet (50 mg total) by mouth daily. 11/02/16   Fayrene Helper, MD  methocarbamol (ROBAXIN) 500 MG tablet Take 1 tablet (500 mg total) by mouth every 8 (eight) hours as needed for muscle spasms. 03/18/17   Forde Dandy, MD  pantoprazole (PROTONIX) 20 MG tablet Take 1 tablet (20 mg total) by mouth 2 (two) times daily before a meal. 01/18/17   Fayrene Helper, MD  predniSONE (STERAPRED UNI-PAK 21 TAB) 10 MG (21) TBPK tablet Take by mouth daily. Take 6 tabs by mouth daily  for 2 days, then 5 tabs for 2 days, then 4 tabs for 2 days, then 3 tabs for 2 days, 2 tabs for 2 days, then 1 tab by mouth daily for 2 days 03/18/17   Forde Dandy, MD    Family History Family History  Problem Relation Age of Onset  . Hypertension Mother   . Diabetes Mother   . Cancer Mother        Uterine   . Heart failure Mother        CABG  . Cancer Father 85       Prostate   . Hypertension Father   . Bradycardia Father        Irregular heart beat   . Diabetes Brother   . Diabetes Brother   . Hypertension Brother   . Hypertension Brother     Social History Social History  Substance Use Topics  . Smoking status: Former Smoker    Packs/day: 0.50    Years: 7.00    Types: Cigarettes    Quit date: 08/05/2012  . Smokeless tobacco: Never Used     Comment: quit in 2014  . Alcohol use No     Allergies   Codeine and Metoclopramide hcl   Review of Systems Review of Systems  Constitutional: Negative for fever.  Cardiovascular: Negative for chest pain.  Gastrointestinal: Negative for abdominal pain and bowel  incontinence.  Genitourinary: Negative for bladder incontinence, dysuria and pelvic pain.  Musculoskeletal: Positive for back pain.  Allergic/Immunologic: Negative for immunocompromised state.  Neurological: Negative for tingling, weakness, numbness and paresthesias.  Hematological: Does not bruise/bleed easily.     Physical Exam Updated Vital Signs BP (!) 144/68   Pulse 72   Temp 98.2 F (36.8 C)   Resp 18   Ht 5\' 11"  (1.803 m)   Wt 92.1 kg (203 lb)   LMP 02/22/2017   SpO2 100%   BMI 28.31 kg/m   Physical Exam Physical Exam  Nursing note and vitals  reviewed. Constitutional: Well developed, well nourished, non-toxic, and in no acute distress Head: Normocephalic and atraumatic.  Mouth/Throat: Oropharynx is clear and moist.  Neck: Normal range of motion. Neck supple.  Cardiovascular: Normal rate and regular rhythm.   Pulmonary/Chest: Effort normal.  Abdominal: Soft. There is no tenderness. There is no rebound and no guarding.  Musculoskeletal: Normal range of motion. tenderness to palpation over low lumbar right paraspinal muscles and over SI joint Neurological: Alert, no facial droop, fluent speech, moves all extremities symmetrically, +2 patellar achilles reflexes bilaterally, sensation to light touch in tact throughout bilateral lower extremities, full strength in bilateral lower extremities Skin: Skin is warm and dry.  Psychiatric: Cooperative   ED Treatments / Results  Labs (all labs ordered are listed, but only abnormal results are displayed) Labs Reviewed - No data to display  EKG  EKG Interpretation None       Radiology No results found.  Procedures Procedures (including critical care time)  Medications Ordered in ED Medications  ketorolac (TORADOL) 30 MG/ML injection 30 mg (not administered)  methylPREDNISolone sodium succinate (SOLU-MEDROL) 125 mg/2 mL injection 80 mg (not administered)     Initial Impression / Assessment and Plan / ED Course    I have reviewed the triage vital signs and the nursing notes.  Pertinent labs & imaging results that were available during my care of the patient were reviewed by me and considered in my medical decision making (see chart for details).     No concerning features on history of exam for acute spinal cord process. Neuro in tact, ambulatory. Will treat with usual course of steroids, anti-inflammatory medications, muscle relaxants. Strict return and follow-up instructions reviewed. She expressed understanding of all discharge instructions and felt comfortable with the plan of care.   Final Clinical Impressions(s) / ED Diagnoses   Final diagnoses:  Acute right-sided low back pain with right-sided sciatica    New Prescriptions New Prescriptions   IBUPROFEN (ADVIL,MOTRIN) 600 MG TABLET    Take 1 tablet (600 mg total) by mouth every 6 (six) hours as needed.   METHOCARBAMOL (ROBAXIN) 500 MG TABLET    Take 1 tablet (500 mg total) by mouth every 8 (eight) hours as needed for muscle spasms.   PREDNISONE (STERAPRED UNI-PAK 21 TAB) 10 MG (21) TBPK TABLET    Take by mouth daily. Take 6 tabs by mouth daily  for 2 days, then 5 tabs for 2 days, then 4 tabs for 2 days, then 3 tabs for 2 days, 2 tabs for 2 days, then 1 tab by mouth daily for 2 days     Forde Dandy, MD 03/18/17 [id number]

## 2017-04-06 IMAGING — MG MM DIGITAL DIAGNOSTIC UNILAT *R*
2 series · 2 of 2 positions shown · non-contrast
Comparison: Previous exam(s).

CLINICAL DATA: Post ultrasound-guided biopsy of a mass in the right
breast at the 12 o'clock location.

EXAM:
DIAGNOSTIC RIGHT MAMMOGRAM POST ULTRASOUND BIOPSY

[R CC]
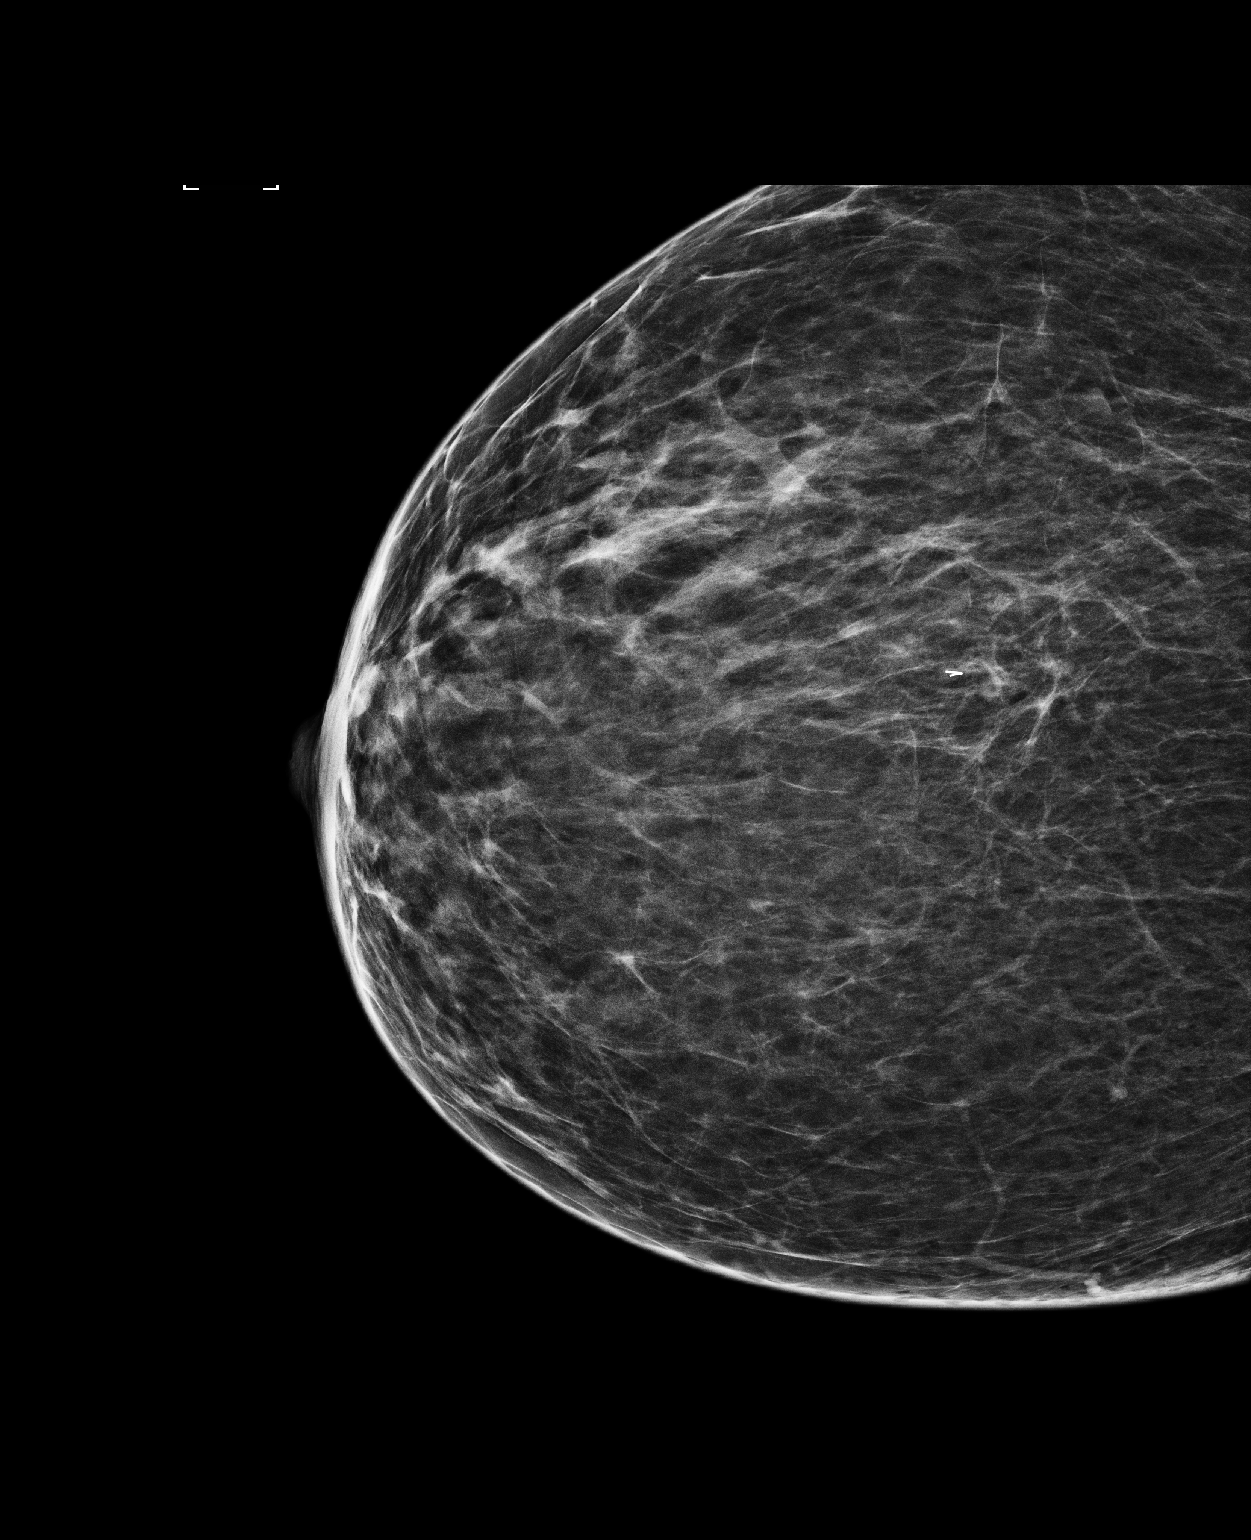

[R ML]
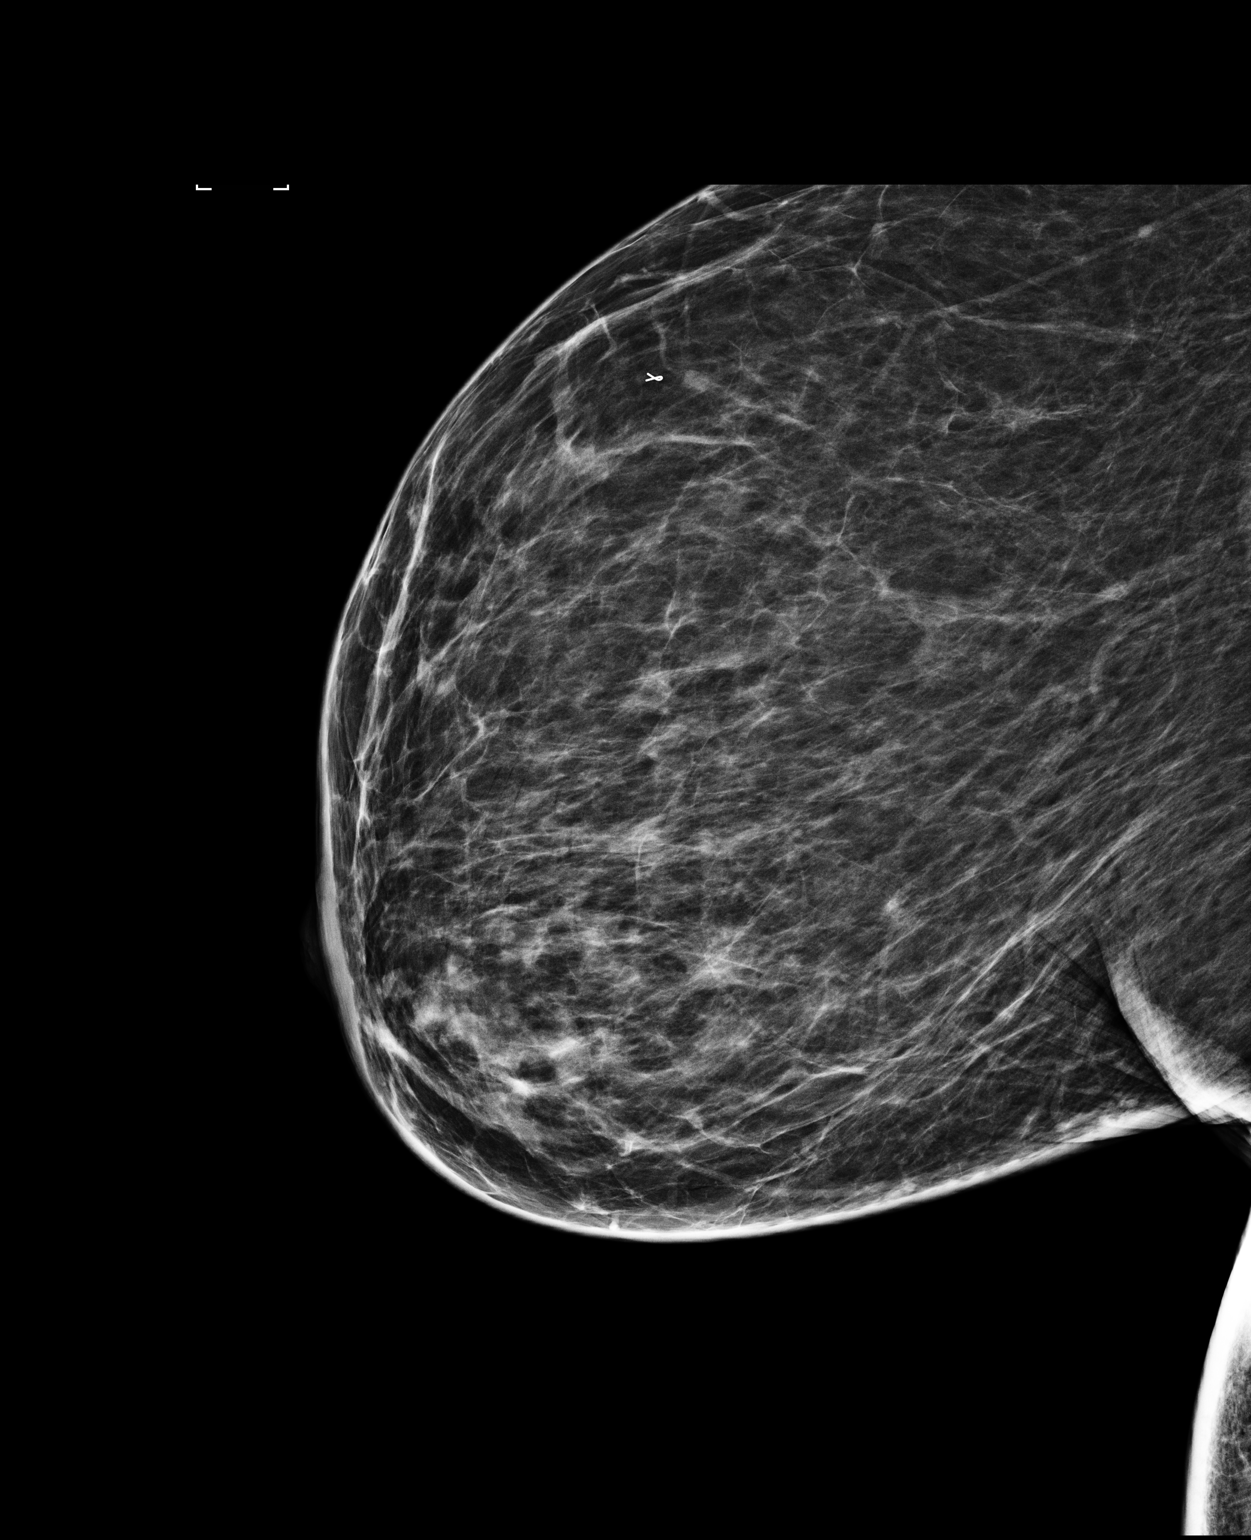

[2 of 2 positions shown; findings below may reference images not displayed]

FINDINGS: Mammographic images were obtained following ultrasound guided biopsy
of a small mass in the upper right breast at the approximate 12
o'clock location. A ribbon shaped biopsy marking clip is present and
located approximately 6 mm anterior to the biopsied mass..
IMPRESSION: Ribbon shaped biopsy marking clip located approximately 6 mm
anterior to the biopsied mass in the right breast at the 12 o'clock
location.

Final Assessment: Post Procedure Mammograms for Marker Placement

## 2017-04-26 ENCOUNTER — Other Ambulatory Visit: Payer: Self-pay | Admitting: Family Medicine

## 2017-04-26 ENCOUNTER — Encounter: Payer: Self-pay | Admitting: Family Medicine

## 2017-04-26 DIAGNOSIS — I10 Essential (primary) hypertension: Secondary | ICD-10-CM

## 2017-04-27 ENCOUNTER — Telehealth: Payer: Self-pay

## 2017-04-29 NOTE — Telephone Encounter (Signed)
My chart message forwarded,

## 2017-06-24 ENCOUNTER — Other Ambulatory Visit: Payer: Self-pay | Admitting: Family Medicine

## 2017-06-24 ENCOUNTER — Encounter: Payer: Self-pay | Admitting: Family Medicine

## 2017-06-24 NOTE — Telephone Encounter (Signed)
Seen [phone] 18

## 2017-07-20 DIAGNOSIS — H5213 Myopia, bilateral: Secondary | ICD-10-CM | POA: Diagnosis not present

## 2017-07-20 DIAGNOSIS — H524 Presbyopia: Secondary | ICD-10-CM | POA: Diagnosis not present

## 2017-07-21 ENCOUNTER — Ambulatory Visit: Payer: 59 | Admitting: Family Medicine

## 2017-08-23 ENCOUNTER — Telehealth: Payer: Self-pay

## 2017-08-23 DIAGNOSIS — Z8639 Personal history of other endocrine, nutritional and metabolic disease: Secondary | ICD-10-CM | POA: Diagnosis not present

## 2017-08-23 DIAGNOSIS — I10 Essential (primary) hypertension: Secondary | ICD-10-CM

## 2017-08-23 NOTE — Telephone Encounter (Signed)
Labs reordered d/t orders expired.

## 2017-08-24 LAB — CBC
HCT: 40.1 % (ref 35.0–45.0)
Hemoglobin: 13.5 g/dL (ref 11.7–15.5)
MCH: 29 pg (ref 27.0–33.0)
MCHC: 33.7 g/dL (ref 32.0–36.0)
MCV: 86.1 fL (ref 80.0–100.0)
MPV: 10.6 fL (ref 7.5–12.5)
PLATELETS: 253 10*3/uL (ref 140–400)
RBC: 4.66 10*6/uL (ref 3.80–5.10)
RDW: 12.7 % (ref 11.0–15.0)
WBC: 9.3 10*3/uL (ref 3.8–10.8)

## 2017-08-24 LAB — URINALYSIS, ROUTINE W REFLEX MICROSCOPIC
BILIRUBIN URINE: NEGATIVE
Glucose, UA: NEGATIVE
Hgb urine dipstick: NEGATIVE
KETONES UR: NEGATIVE
Leukocytes, UA: NEGATIVE
Nitrite: NEGATIVE
PROTEIN: NEGATIVE
Specific Gravity, Urine: 1.006 (ref 1.001–1.03)
pH: 6 (ref 5.0–8.0)

## 2017-08-24 LAB — COMPLETE METABOLIC PANEL WITH GFR
AG Ratio: 1.6 (calc) (ref 1.0–2.5)
ALBUMIN MSPROF: 4.8 g/dL (ref 3.6–5.1)
ALKALINE PHOSPHATASE (APISO): 31 U/L — AB (ref 33–115)
ALT: 15 U/L (ref 6–29)
AST: 16 U/L (ref 10–35)
BILIRUBIN TOTAL: 0.4 mg/dL (ref 0.2–1.2)
BUN: 16 mg/dL (ref 7–25)
CO2: 27 mmol/L (ref 20–32)
Calcium: 9.8 mg/dL (ref 8.6–10.2)
Chloride: 101 mmol/L (ref 98–110)
Creat: 0.9 mg/dL (ref 0.50–1.10)
GFR, Est African American: 88 mL/min/{1.73_m2} (ref >=60)
GFR, Est Non African American: 76 mL/min/{1.73_m2} (ref >=60)
GLOBULIN: 3 g/dL (ref 1.9–3.7)
Glucose, Bld: 87 mg/dL (ref 65–139)
POTASSIUM: 4 mmol/L (ref 3.5–5.3)
Sodium: 138 mmol/L (ref 135–146)
Total Protein: 7.8 g/dL (ref 6.1–8.1)

## 2017-08-24 LAB — HEMOGLOBIN A1C
HEMOGLOBIN A1C: 5.4 %{Hb} (ref <5.7)
MEAN PLASMA GLUCOSE: 108 (calc)
eAG (mmol/L): 6 (calc)

## 2017-08-24 LAB — MICROALBUMIN / CREATININE URINE RATIO
CREATININE, URINE: 32 mg/dL (ref 20–275)
Microalb, Ur: <0.2 mg/dL

## 2017-08-24 LAB — LIPID PANEL
CHOL/HDL RATIO: 3.9 (calc) (ref <5.0)
Cholesterol: 228 mg/dL — ABNORMAL HIGH (ref <200)
HDL: 58 mg/dL (ref >50)
LDL Cholesterol (Calc): 144 mg/dL (calc) — ABNORMAL HIGH
NON-HDL CHOLESTEROL (CALC): 170 mg/dL — AB (ref <130)
Triglycerides: 135 mg/dL (ref <150)

## 2017-08-25 ENCOUNTER — Ambulatory Visit: Payer: 59 | Admitting: Family Medicine

## 2017-08-25 ENCOUNTER — Encounter: Payer: Self-pay | Admitting: Family Medicine

## 2017-08-25 VITALS — BP 112/70 | HR 65 | Resp 16 | Ht 71.0 in | Wt 219.0 lb

## 2017-08-25 DIAGNOSIS — E559 Vitamin D deficiency, unspecified: Secondary | ICD-10-CM | POA: Diagnosis not present

## 2017-08-25 DIAGNOSIS — R1013 Epigastric pain: Secondary | ICD-10-CM | POA: Diagnosis not present

## 2017-08-25 DIAGNOSIS — J301 Allergic rhinitis due to pollen: Secondary | ICD-10-CM

## 2017-08-25 DIAGNOSIS — M5430 Sciatica, unspecified side: Secondary | ICD-10-CM

## 2017-08-25 DIAGNOSIS — I10 Essential (primary) hypertension: Secondary | ICD-10-CM | POA: Diagnosis not present

## 2017-08-25 DIAGNOSIS — E785 Hyperlipidemia, unspecified: Secondary | ICD-10-CM | POA: Diagnosis not present

## 2017-08-25 DIAGNOSIS — E663 Overweight: Secondary | ICD-10-CM

## 2017-08-25 DIAGNOSIS — M549 Dorsalgia, unspecified: Secondary | ICD-10-CM

## 2017-08-25 NOTE — Patient Instructions (Addendum)
Annual physical exam in 6 month, call if you need me before  Congrats on great blood pressure and blood sugar  Please reduce fat in diet, cholesterol is high, this increases risk of stroke and heart disease  Congrats on no smoke in 5 years   Fasting lipid, chem 7 and EGFr and vitamin D in 5.5 months    It is important that you exercise regularly at least 30 minutes 5 times a week. If you develop chest pain, have severe difficulty breathing, or feel very tired, stop exercising immediately and seek medical attention   Weight loss goal of 8 to 10 pounds   Please schedule and keep your appt for your mammogram due May 3 or after

## 2017-08-25 NOTE — Progress Notes (Signed)
Kelsey HELMKAMP     MRN: [id number]      DOB: 1970-11-23   HPI [name] is here for follow up and re-evaluation of chronic medical conditions, medication management and review of any available recent lab and radiology data.  Preventive health is updated, specifically  Cancer screening and Immunization.    The PT denies any adverse reactions to current medications since the last visit.  There are no new concerns.  There are no specific complaints   ROS Denies recent fever or chills. Denies sinus pressure, nasal congestion, ear pain or sore throat. Denies chest congestion, productive cough or wheezing. Denies chest pains, palpitations and leg swelling Denies abdominal pain, nausea, vomiting,diarrhea or constipation.   Denies dysuria, frequency, hesitancy or incontinence. Denies joint pain, swelling and limitation in mobility. Denies headaches, seizures, numbness, or tingling. Denies depression, anxiety or insomnia. Denies skin break down or rash.   PE  BP 112/70   Pulse 65   Resp 16   Ht 5\' 11"  (1.803 m)   Wt 219 lb (99.3 kg)   SpO2 98%   BMI 30.54 kg/m   Patient alert and oriented and in no cardiopulmonary distress.  HEENT: No facial asymmetry, EOMI,   oropharynx pink and moist.  Neck supple no JVD, no mass.  Chest: Clear to auscultation bilaterally.  CVS: S1, S2 no murmurs, no S3.Regular rate.  ABD: Soft non tender.   Ext: No edema  MS: Adequate ROM spine, shoulders, hips and knees.  Skin: Intact, no ulcerations or rash noted.  Psych: Good eye contact, normal affect. Memory intact not anxious or depressed appearing.  CNS: CN 2-12 intact, power,  normal throughout.no focal deficits noted.   Assessment & Plan  Essential hypertension, benign Controlled, no change in medication DASH diet and commitment to daily physical activity for a minimum of 30 minutes discussed and encouraged, as a part of hypertension management. The importance of attaining a  healthy weight is also discussed.  BP/Weight 08/25/2017 03/18/2017 02/10/2017 01/18/2017 08/25/2016 04/29/2016 11/17/4130  Systolic BP 440 102 725 366 440 347 425  Diastolic BP 70 68 64 62 80 78 73  Wt. (Lbs) 219 203 206.25 201.25 201 198 209  BMI 30.54 28.31 28.77 28.07 26.52 26.12 29.15  Some encounter information is confidential and restricted. Go to Review Flowsheets activity to see all data.       Hyperlipidemia LDL goal <100 Hyperlipidemia:Low fat diet discussed and encouraged.   Lipid Panel  Lab Results  Component Value Date   CHOL 228 (H) 08/23/2017   HDL 58 08/23/2017   LDLCALC 82 08/22/2016   TRIG 135 08/23/2017   CHOLHDL 3.9 08/23/2017   Needs to reduce fat in diet    Overweight (BMI 25.0-29.9) Deteriorated. Patient re-educated about  the importance of commitment to a  minimum of 150 minutes of exercise per week.  The importance of healthy food choices with portion control discussed. Encouraged to start a food diary, count calories and to consider  joining a support group. Sample diet sheets offered. Goals set by the patient for the next several months.   Weight /BMI 08/25/2017 03/18/2017 02/10/2017  WEIGHT 219 lb 203 lb 206 lb 4 oz  HEIGHT 5\' 11"  5\' 11"  5\' 11"   BMI 30.54 kg/m2 28.31 kg/m2 28.77 kg/m2  Some encounter information is confidential and restricted. Go to Review Flowsheets activity to see all data.      Dyspepsia Controlled, no change in medication   Allergic rhinitis Controlled, no change  in medication   Back pain with sciatica No recent flare

## 2017-08-30 NOTE — Assessment & Plan Note (Signed)
Hyperlipidemia:Low fat diet discussed and encouraged.   Lipid Panel  Lab Results  Component Value Date   CHOL 228 (H) 08/23/2017   HDL 58 08/23/2017   LDLCALC 82 08/22/2016   TRIG 135 08/23/2017   CHOLHDL 3.9 08/23/2017   Needs to reduce fat in diet

## 2017-08-30 NOTE — Assessment & Plan Note (Signed)
No recent flare.  

## 2017-08-30 NOTE — Assessment & Plan Note (Signed)
Controlled, no change in medication  

## 2017-08-30 NOTE — Assessment & Plan Note (Signed)
Deteriorated. Patient re-educated about  the importance of commitment to a  minimum of 150 minutes of exercise per week.  The importance of healthy food choices with portion control discussed. Encouraged to start a food diary, count calories and to consider  joining a support group. Sample diet sheets offered. Goals set by the patient for the next several months.   Weight /BMI 08/25/2017 03/18/2017 02/10/2017  WEIGHT 219 lb 203 lb 206 lb 4 oz  HEIGHT 5\' 11"  5\' 11"  5\' 11"   BMI 30.54 kg/m2 28.31 kg/m2 28.77 kg/m2  Some encounter information is confidential and restricted. Go to Review Flowsheets activity to see all data.

## 2017-08-30 NOTE — Assessment & Plan Note (Signed)
Controlled, no change in medication DASH diet and commitment to daily physical activity for a minimum of 30 minutes discussed and encouraged, as a part of hypertension management. The importance of attaining a healthy weight is also discussed.  BP/Weight 08/25/2017 03/18/2017 02/10/2017 01/18/2017 08/25/2016 04/29/2016 3/53/2992  Systolic BP 426 834 196 222 979 892 119  Diastolic BP 70 68 64 62 80 78 73  Wt. (Lbs) 219 203 206.25 201.25 201 198 209  BMI 30.54 28.31 28.77 28.07 26.52 26.12 29.15  Some encounter information is confidential and restricted. Go to Review Flowsheets activity to see all data.

## 2017-09-28 ENCOUNTER — Other Ambulatory Visit: Payer: Self-pay | Admitting: Family Medicine

## 2017-09-28 DIAGNOSIS — Z1231 Encounter for screening mammogram for malignant neoplasm of breast: Secondary | ICD-10-CM

## 2017-10-11 ENCOUNTER — Other Ambulatory Visit: Payer: Self-pay | Admitting: Family Medicine

## 2017-10-11 DIAGNOSIS — R928 Other abnormal and inconclusive findings on diagnostic imaging of breast: Secondary | ICD-10-CM

## 2017-10-22 ENCOUNTER — Ambulatory Visit (HOSPITAL_COMMUNITY): Payer: 59

## 2017-10-26 ENCOUNTER — Encounter (HOSPITAL_COMMUNITY): Payer: Self-pay

## 2017-10-26 ENCOUNTER — Ambulatory Visit (HOSPITAL_COMMUNITY)
Admission: RE | Admit: 2017-10-26 | Discharge: 2017-10-26 | Disposition: A | Discharge status: Home / Self Care | Payer: 59 | Source: Ambulatory Visit | Attending: Family Medicine | Admitting: Family Medicine

## 2017-10-26 DIAGNOSIS — N6011 Diffuse cystic mastopathy of right breast: Secondary | ICD-10-CM | POA: Diagnosis not present

## 2017-10-26 DIAGNOSIS — N6001 Solitary cyst of right breast: Secondary | ICD-10-CM | POA: Insufficient documentation

## 2017-10-26 DIAGNOSIS — R928 Other abnormal and inconclusive findings on diagnostic imaging of breast: Secondary | ICD-10-CM | POA: Diagnosis not present

## 2017-10-26 DIAGNOSIS — Z1231 Encounter for screening mammogram for malignant neoplasm of breast: Secondary | ICD-10-CM

## 2017-11-01 ENCOUNTER — Encounter: Payer: Self-pay | Admitting: Family Medicine

## 2017-11-01 ENCOUNTER — Other Ambulatory Visit: Payer: Self-pay | Admitting: Family Medicine

## 2017-11-01 DIAGNOSIS — I10 Essential (primary) hypertension: Secondary | ICD-10-CM

## 2017-11-02 ENCOUNTER — Other Ambulatory Visit: Payer: Self-pay

## 2017-11-02 DIAGNOSIS — I10 Essential (primary) hypertension: Secondary | ICD-10-CM

## 2017-11-02 MED ORDER — LOSARTAN POTASSIUM 50 MG PO TABS: 50.0000 mg | ORAL_TABLET | Freq: Every day | ORAL | 1 refills | Status: DC

## 2017-12-20 ENCOUNTER — Encounter: Payer: Self-pay | Admitting: Family Medicine

## 2017-12-20 ENCOUNTER — Other Ambulatory Visit: Payer: Self-pay | Admitting: Family Medicine

## 2018-02-07 ENCOUNTER — Encounter: Payer: Self-pay | Admitting: Family Medicine

## 2018-02-07 DIAGNOSIS — E559 Vitamin D deficiency, unspecified: Secondary | ICD-10-CM | POA: Diagnosis not present

## 2018-02-07 DIAGNOSIS — I10 Essential (primary) hypertension: Secondary | ICD-10-CM | POA: Diagnosis not present

## 2018-02-07 DIAGNOSIS — E785 Hyperlipidemia, unspecified: Secondary | ICD-10-CM | POA: Diagnosis not present

## 2018-02-07 LAB — BASIC METABOLIC PANEL WITH GFR
BUN: 15 mg/dL (ref 7–25)
CO2: 25 mmol/L (ref 20–32)
Calcium: 9.2 mg/dL (ref 8.6–10.2)
Chloride: 104 mmol/L (ref 98–110)
Creat: 0.83 mg/dL (ref 0.50–1.10)
GFR, EST AFRICAN AMERICAN: 97 mL/min/{1.73_m2} (ref >=60)
GFR, Est Non African American: 83 mL/min/{1.73_m2} (ref >=60)
Glucose, Bld: 78 mg/dL (ref 65–99)
POTASSIUM: 4 mmol/L (ref 3.5–5.3)
SODIUM: 139 mmol/L (ref 135–146)

## 2018-02-07 LAB — VITAMIN D 25 HYDROXY (VIT D DEFICIENCY, FRACTURES): Vit D, 25-Hydroxy: 33 ng/mL (ref 30–100)

## 2018-02-07 LAB — LIPID PANEL
CHOL/HDL RATIO: 2.9 (calc) (ref <5.0)
Cholesterol: 164 mg/dL (ref <200)
HDL: 56 mg/dL (ref >50)
LDL CHOLESTEROL (CALC): 89 mg/dL
Non-HDL Cholesterol (Calc): 108 mg/dL (calc) (ref <130)
TRIGLYCERIDES: 95 mg/dL (ref <150)

## 2018-03-03 ENCOUNTER — Encounter: Payer: Self-pay | Admitting: Family Medicine

## 2018-03-03 ENCOUNTER — Ambulatory Visit (INDEPENDENT_AMBULATORY_CARE_PROVIDER_SITE_OTHER): Payer: 59 | Admitting: Family Medicine

## 2018-03-03 VITALS — BP 118/70 | HR 62 | Resp 16 | Ht 71.0 in | Wt 205.0 lb

## 2018-03-03 DIAGNOSIS — I10 Essential (primary) hypertension: Secondary | ICD-10-CM

## 2018-03-03 DIAGNOSIS — Z0000 Encounter for general adult medical examination without abnormal findings: Secondary | ICD-10-CM | POA: Diagnosis not present

## 2018-03-03 MED ORDER — LOSARTAN POTASSIUM-HCTZ 50-12.5 MG PO TABS: 1.0000 | ORAL_TABLET | Freq: Every day | ORAL | 3 refills | Status: DC

## 2018-03-03 NOTE — Patient Instructions (Signed)
F/U in 6 months, call if you ned me before  CONGRATULATIONS and all the best!   Fasting chem 7 anfd eGFR and tSH in 5.5 months  Three stool cards from office today for colon testing  Excellent exam   

## 2018-03-04 ENCOUNTER — Encounter: Payer: Self-pay | Admitting: Family Medicine

## 2018-03-04 NOTE — Assessment & Plan Note (Signed)
Annual exam as documented. Counseling done  re healthy lifestyle involving commitment to 150 minutes exercise per week, heart healthy diet, and attaining healthy weight.The importance of adequate sleep also discussed.  Immunization and cancer screening needs are specifically addressed at this visit.  

## 2018-03-04 NOTE — Progress Notes (Signed)
    [name]     MRN: [id number]      DOB: [birth year]  HPI: Patient is in for annual physical exam. No other health concerns are expressed or addressed at the visit. Recent labs, if available are reviewed. Immunization is reviewed , will get flu vaccine on the job PE: Pulse 62   Resp 16   Wt 205 lb (93 kg)   LMP 02/20/2018 (Approximate)   SpO2 100%   BMI 28.59 kg/m   Pleasant  female, alert and oriented x 3, in no cardio-pulmonary distress. Afebrile. HEENT No facial trauma or asymetry. Sinuses non tender.  Extra occullar muscles intact, pupils equally reactive to light. External ears normal, tympanic membranes clear. Oropharynx moist, no exudate. Neck: supple, no adenopathy,JVD or thyromegaly.No bruits.  Chest: Clear to ascultation bilaterally.No crackles or wheezes. Non tender to palpation  Breast: No asymetry,no masses or lumps. No tenderness. No nipple discharge or inversion. No axillary or supraclavicular adenopathy  Cardiovascular system; Heart sounds normal,  S1 and  S2 ,no S3.  No murmur, or thrill. Apical beat not displaced Peripheral pulses normal.  Abdomen: Soft, non tender, no organomegaly or masses. No bruits. Bowel sounds normal. No guarding, tenderness or rebound.  Rectal:  Stool cards x 3   GU: No exam needed   Musculoskeletal exam: Full ROM of spine, hips , shoulders and knees. No deformity ,swelling or crepitus noted. No muscle wasting or atrophy.   Neurologic: Cranial nerves 2 to 12 intact. Power, tone ,sensation and reflexes normal throughout. No disturbance in gait. No tremor.  Skin: Intact, no ulceration, erythema , scaling or rash noted. Pigmentation normal throughout  Psych; Normal mood and affect. Judgement and concentration normal   Assessment & Plan:  Annual physical exam Annual exam as documented. Counseling done  re healthy lifestyle involving commitment to 150 minutes exercise per week, heart healthy diet,  and attaining healthy weight.The importance of adequate sleep also discussed. Immunization and cancer screening needs are specifically addressed at this visit.

## 2018-04-13 ENCOUNTER — Encounter: Payer: Self-pay | Admitting: Family Medicine

## 2018-04-13 DIAGNOSIS — Z1211 Encounter for screening for malignant neoplasm of colon: Secondary | ICD-10-CM

## 2018-04-13 LAB — HEMOCCULT GUIAC POC 1CARD (OFFICE)
Card #3 Fecal Occult Blood, POC: NEGATIVE
FECAL OCCULT BLD: NEGATIVE
FECAL OCCULT BLD: NEGATIVE

## 2018-05-16 ENCOUNTER — Encounter: Payer: Self-pay | Admitting: Family Medicine

## 2018-05-16 ENCOUNTER — Other Ambulatory Visit: Payer: Self-pay | Admitting: Family Medicine

## 2018-05-16 MED ORDER — CYCLOBENZAPRINE HCL 10 MG PO TABS: ORAL_TABLET | ORAL | 0 refills | Status: DC

## 2018-05-16 NOTE — Progress Notes (Signed)
flexeril 10

## 2018-06-07 ENCOUNTER — Encounter: Payer: Self-pay | Admitting: Family Medicine

## 2018-06-08 ENCOUNTER — Telehealth: Payer: Self-pay

## 2018-06-08 MED ORDER — HYDROCHLOROTHIAZIDE 12.5 MG PO CAPS: 12.5000 mg | ORAL_CAPSULE | Freq: Every day | ORAL | 2 refills | Status: DC

## 2018-06-08 MED ORDER — LOSARTAN POTASSIUM 50 MG PO TABS: 50.0000 mg | ORAL_TABLET | Freq: Every day | ORAL | 2 refills | Status: DC

## 2018-06-08 NOTE — Telephone Encounter (Signed)
Losartan and hctz ordered separately because combo med on back order.

## 2018-07-29 ENCOUNTER — Encounter: Payer: Self-pay | Admitting: Family Medicine

## 2018-07-30 ENCOUNTER — Other Ambulatory Visit: Payer: Self-pay | Admitting: Family Medicine

## 2018-07-30 MED ORDER — FAMOTIDINE 40 MG PO TABS: ORAL_TABLET | ORAL | 0 refills | Status: DC

## 2018-07-30 MED ORDER — IBUPROFEN 600 MG PO TABS: ORAL_TABLET | ORAL | 0 refills | Status: DC

## 2018-07-30 MED ORDER — CYCLOBENZAPRINE HCL 10 MG PO TABS: ORAL_TABLET | ORAL | 1 refills | Status: DC

## 2018-08-03 DIAGNOSIS — I10 Essential (primary) hypertension: Secondary | ICD-10-CM | POA: Diagnosis not present

## 2018-08-04 LAB — BASIC METABOLIC PANEL WITH GFR
BUN: 15 mg/dL (ref 7–25)
CALCIUM: 9.7 mg/dL (ref 8.6–10.2)
CHLORIDE: 102 mmol/L (ref 98–110)
CO2: 27 mmol/L (ref 20–32)
Creat: 0.88 mg/dL (ref 0.50–1.10)
GFR, EST NON AFRICAN AMERICAN: 78 mL/min/{1.73_m2} (ref >=60)
GFR, Est African American: 90 mL/min/{1.73_m2} (ref >=60)
GLUCOSE: 70 mg/dL (ref 65–139)
POTASSIUM: 3.9 mmol/L (ref 3.5–5.3)
SODIUM: 138 mmol/L (ref 135–146)

## 2018-08-04 LAB — TSH: TSH: 1.64 m[IU]/L

## 2018-08-26 DIAGNOSIS — H5213 Myopia, bilateral: Secondary | ICD-10-CM | POA: Diagnosis not present

## 2018-08-26 DIAGNOSIS — H524 Presbyopia: Secondary | ICD-10-CM | POA: Diagnosis not present

## 2018-09-01 ENCOUNTER — Encounter: Payer: Self-pay | Admitting: Family Medicine

## 2018-09-01 ENCOUNTER — Ambulatory Visit: Payer: 59 | Admitting: Family Medicine

## 2018-09-01 ENCOUNTER — Other Ambulatory Visit: Payer: Self-pay

## 2018-09-01 VITALS — BP 114/70 | HR 69 | Resp 14 | Ht 71.0 in | Wt 201.1 lb

## 2018-09-01 DIAGNOSIS — M5430 Sciatica, unspecified side: Secondary | ICD-10-CM | POA: Diagnosis not present

## 2018-09-01 DIAGNOSIS — M549 Dorsalgia, unspecified: Secondary | ICD-10-CM | POA: Diagnosis not present

## 2018-09-01 DIAGNOSIS — E663 Overweight: Secondary | ICD-10-CM

## 2018-09-01 DIAGNOSIS — I10 Essential (primary) hypertension: Secondary | ICD-10-CM | POA: Diagnosis not present

## 2018-09-01 DIAGNOSIS — I83893 Varicose veins of bilateral lower extremities with other complications: Secondary | ICD-10-CM | POA: Diagnosis not present

## 2018-09-01 MED ORDER — CYCLOBENZAPRINE HCL 10 MG PO TABS: 10.0000 mg | ORAL_TABLET | Freq: Three times a day (TID) | ORAL | 1 refills | Status: DC | PRN

## 2018-09-01 NOTE — Patient Instructions (Signed)
Amm nual physical exam 09/13 or after , call if you needme efore  Pls call in July for fasting labs for physical    Please schedule mammogram due in May  No medication changes  Keep up great health habits  You are referred re varicose veins

## 2018-09-02 ENCOUNTER — Other Ambulatory Visit (HOSPITAL_COMMUNITY): Payer: Self-pay | Admitting: Family Medicine

## 2018-09-02 ENCOUNTER — Encounter: Payer: Self-pay | Admitting: Family Medicine

## 2018-09-02 DIAGNOSIS — Z1231 Encounter for screening mammogram for malignant neoplasm of breast: Secondary | ICD-10-CM

## 2018-09-02 NOTE — Progress Notes (Signed)
[name]     MRN: [id number]      DOB: 05-13-1971   HPI [name] is here for follow up and re-evaluation of chronic medical conditions, medication management and review of any available recent lab and radiology data.  Preventive health is updated, specifically  Cancer screening and Immunization.   Questions or concerns regarding consultations or procedures which the PT has had in the interim are  addressed. The PT denies any adverse reactions to current medications since the last visit. States the bedtime muscle relaxant is very beneficial C/o worsening varicose veins in both legs, distended and painful, needs referral Continues to work on healthy food choice and regualar exercise to improve her health  ROS Denies recent fever or chills. Denies sinus pressure, nasal congestion, ear pain or sore throat. Denies chest congestion, productive cough or wheezing. Denies chest pains, palpitations and leg swelling Denies abdominal pain, nausea, vomiting,diarrhea or constipation.   Denies dysuria, frequency, hesitancy or incontinence. Denies uncontrolled joint pain, swelling and limitation in mobility. Denies headaches, seizures, numbness, or tingling. Denies depression, anxiety or insomnia. Denies skin break down or rash.   PE  BP 114/70   Pulse 69   Resp 14   Ht 5\' 11"  (1.803 m)   Wt 201 lb 1.3 oz (91.2 kg)   SpO2 95% Comment: room air  BMI 28.04 kg/m   Patient alert and oriented and in no cardiopulmonary distress.  HEENT: No facial asymmetry, EOMI,   oropharynx pink and moist.  Neck supple no JVD, no mass.  Chest: Clear to auscultation bilaterally.  CVS: S1, S2 no murmurs, no S3.Regular rate. Veins distended in lower extremities  ABD: Soft non tender.   Ext: No edema  MS: Adequate ROM spine, shoulders, hips and knees.  Skin: Intact, no ulcerations or rash noted.  Psych: Good eye contact, normal affect. Memory intact not anxious or depressed appearing.   CNS: CN 2-12 intact, power,  normal throughout.no focal deficits noted.   Assessment & Plan  Essential hypertension, benign Controlled, no change in medication DASH diet and commitment to daily physical activity for a minimum of 30 minutes discussed and encouraged, as a part of hypertension management. The importance of attaining a healthy weight is also discussed.  BP/Weight 09/01/2018 03/03/2018 08/25/2017 03/18/2017 02/10/2017 2/69/4854 11/21/7033  Systolic BP 009 381 829 937 169 678 938  Diastolic BP 70 70 70 68 64 62 80  Wt. (Lbs) 201.08 205 219 203 206.25 201.25 201  BMI 28.04 28.59 30.54 28.31 28.77 28.07 26.52  Some encounter information is confidential and restricted. Go to Review Flowsheets activity to see all data.       Overweight (BMI 25.0-29.9) Improved.  Patient re-educated about  the importance of commitment to a  minimum of 150 minutes of exercise per week as able.  The importance of healthy food choices with portion control discussed, as well as eating regularly and within a 12 hour window most days. The need to choose "clean , green" food 50 to 75% of the time is discussed, as well as to make water the primary drink and set a goal of 64 ounces water daily.  Encouraged to start a food diary,  and to consider  joining a support group. Sample diet sheets offered. Goals set by the patient for the next several months.   Weight /BMI 09/01/2018 03/03/2018 08/25/2017  WEIGHT 201 lb 1.3 oz 205 lb 219 lb  HEIGHT 5\' 11"  5\' 11"  5\' 11"   BMI 28.04  kg/m2 28.59 kg/m2 30.54 kg/m2  Some encounter information is confidential and restricted. Go to Review Flowsheets activity to see all data.      Back pain with sciatica Managed  With tylenol and bedtime flexeril  Symptomatic varicose veins, bilateral Increased and progressively worsening  pain and swelling of veins in both lower extremities, refer to Vascular surgeon for eval and management

## 2018-09-02 NOTE — Assessment & Plan Note (Signed)
Improved.  Patient re-educated about  the importance of commitment to a  minimum of 150 minutes of exercise per week as able.  The importance of healthy food choices with portion control discussed, as well as eating regularly and within a 12 hour window most days. The need to choose "clean , green" food 50 to 75% of the time is discussed, as well as to make water the primary drink and set a goal of 64 ounces water daily.  Encouraged to start a food diary,  and to consider  joining a support group. Sample diet sheets offered. Goals set by the patient for the next several months.   Weight /BMI 09/01/2018 03/03/2018 08/25/2017  WEIGHT 201 lb 1.3 oz 205 lb 219 lb  HEIGHT 5\' 11"  5\' 11"  5\' 11"   BMI 28.04 kg/m2 28.59 kg/m2 30.54 kg/m2  Some encounter information is confidential and restricted. Go to Review Flowsheets activity to see all data.

## 2018-09-02 NOTE — Assessment & Plan Note (Signed)
Increased and progressively worsening  pain and swelling of veins in both lower extremities, refer to Vascular surgeon for eval and management

## 2018-09-02 NOTE — Assessment & Plan Note (Signed)
Managed  With tylenol and bedtime flexeril

## 2018-09-02 NOTE — Assessment & Plan Note (Signed)
Controlled, no change in medication DASH diet and commitment to daily physical activity for a minimum of 30 minutes discussed and encouraged, as a part of hypertension management. The importance of attaining a healthy weight is also discussed.  BP/Weight 09/01/2018 03/03/2018 08/25/2017 03/18/2017 02/10/2017 10/08/6220 02/27/9891  Systolic BP 119 417 408 144 818 563 149  Diastolic BP 70 70 70 68 64 62 80  Wt. (Lbs) 201.08 205 219 203 206.25 201.25 201  BMI 28.04 28.59 30.54 28.31 28.77 28.07 26.52  Some encounter information is confidential and restricted. Go to Review Flowsheets activity to see all data.

## 2018-10-11 ENCOUNTER — Other Ambulatory Visit: Payer: Self-pay | Admitting: Family Medicine

## 2018-10-12 ENCOUNTER — Other Ambulatory Visit: Payer: Self-pay | Admitting: Family Medicine

## 2018-10-13 ENCOUNTER — Other Ambulatory Visit: Payer: Self-pay | Admitting: Family Medicine

## 2018-10-14 ENCOUNTER — Other Ambulatory Visit: Payer: Self-pay | Admitting: Family Medicine

## 2018-10-15 ENCOUNTER — Other Ambulatory Visit: Payer: Self-pay | Admitting: Family Medicine

## 2018-10-21 ENCOUNTER — Other Ambulatory Visit: Payer: Self-pay | Admitting: Family Medicine

## 2018-10-22 ENCOUNTER — Other Ambulatory Visit: Payer: Self-pay | Admitting: Family Medicine

## 2018-10-31 ENCOUNTER — Other Ambulatory Visit: Payer: Self-pay

## 2018-10-31 ENCOUNTER — Ambulatory Visit (HOSPITAL_COMMUNITY)
Admission: RE | Admit: 2018-10-31 | Discharge: 2018-10-31 | Disposition: A | Discharge status: Home / Self Care | Payer: 59 | Source: Ambulatory Visit | Attending: Family Medicine | Admitting: Family Medicine

## 2018-10-31 DIAGNOSIS — Z1231 Encounter for screening mammogram for malignant neoplasm of breast: Secondary | ICD-10-CM

## 2018-11-04 ENCOUNTER — Telehealth: Payer: Self-pay | Admitting: Vascular Surgery

## 2018-11-04 ENCOUNTER — Other Ambulatory Visit: Payer: Self-pay

## 2018-11-04 ENCOUNTER — Encounter: Payer: Self-pay | Admitting: Vascular Surgery

## 2018-11-04 ENCOUNTER — Ambulatory Visit (INDEPENDENT_AMBULATORY_CARE_PROVIDER_SITE_OTHER): Payer: 59 | Admitting: Vascular Surgery

## 2018-11-04 VITALS — BP 120/76 | Ht 71.0 in | Wt 196.8 lb

## 2018-11-04 DIAGNOSIS — I872 Venous insufficiency (chronic) (peripheral): Secondary | ICD-10-CM | POA: Diagnosis not present

## 2018-11-04 NOTE — Progress Notes (Signed)
Virtual Visit via Video Note   I connected with [name] on 11/04/2018 using the [employer] virtual platform and verified that I was speaking with the correct person using two identifiers. Patient was located at home and I am at the [facility] office  The limitations of evaluation and management by telemedicine and the availability of in person appointments have been previously discussed with the patient and are documented in the patients chart. The patient expressed understanding and consented to proceed.   PCP: Fayrene Helper, MD  Chief Complaint: Bilateral lower extremity swelling and varicose veins  History of Present Illness: [name] is a 48 y.o. female with history of bilateral lower extremity varicose veins.  This started after childbirth.  She does not have a history of DVT although has had previous studies 2003 2005.  Has never had bleeding or other complications.  States leg swell after being on her feet all day.  Has not worn compression stockings in the past.  Is never had varicose vein intervention.  Past Medical History:  Diagnosis Date  . Essential hypertension, benign   . Migraine headache 2007  . Obesity    Since childhood   . Pneumonia 2007  . Spinal disease   . Supraventricular tachycardia [facility])    Status post RFA July 2015 - Dr. Lovena Le  . Type 2 diabetes mellitus ([place])   . Weight loss    Hopitalized 4 years ago after loosing 200lb on a supervised program, malnurished with severe protein deficiency     Past Surgical History:  Procedure Laterality Date  . ABLATION  12-28-2013   slow pathway modification of AVNRT by Dr Lovena Le  . ELECTROPHYSIOLOGY STUDY N/A 12/28/2013   Procedure: ELECTROPHYSIOLOGY STUDY;  Surgeon: Evans Lance, MD;  Location: [facility] CATH LAB;  Service: Cardiovascular;  Laterality: N/A;  . SUPRAVENTRICULAR TACHYCARDIA ABLATION N/A 12/28/2013   Procedure: SUPRAVENTRICULAR TACHYCARDIA ABLATION;  Surgeon: Evans Lance, MD;  Location:  [facility] CATH LAB;  Service: Cardiovascular;  Laterality: N/A;  . TUBAL LIGATION  1996    Current Meds  Medication Sig  . acetaminophen (TYLENOL) 500 MG tablet Take 1,000 mg by mouth every 6 (six) hours as needed (for pain).   . Cholecalciferol (VITAMIN D-1000 MAX ST) 1000 units tablet Take by mouth.  . cyclobenzaprine (FLEXERIL) 10 MG tablet TAKE 1 TABLET (10 MG TOTAL) BY MOUTH 3 (THREE) TIMES DAILY AS NEEDED FOR MUSCLE SPASMS.  . famotidine (PEPCID) 40 MG tablet Take one tablet once daily , when you take ibuprofen  . fluticasone (FLONASE) 50 MCG/ACT nasal spray Place 2 sprays into both nostrils daily as needed for allergies or rhinitis.  . hydrochlorothiazide (MICROZIDE) 12.5 MG capsule Take 1 capsule (12.5 mg total) by mouth daily.  Marland Kitchen ibuprofen (ADVIL,MOTRIN) 600 MG tablet Take one tablet three times daily for 5 days, then as needed, for back pain  . losartan (COZAAR) 50 MG tablet Take 1 tablet (50 mg total) by mouth daily.    12 system ROS was negative unless otherwise noted in HPI  Observations/Objective: Vitals:   11/04/18 1037  BP: 120/76  SpO2: 99%   Awake alert and oriented She appears to be neurologically intact Bilateral lower extremities appear to have mild edema although varicose veins cannot be visualized  Assessment and Plan: 48 year old female with chronic venous insufficiency giving her C3 venous disease with bilateral lower extremity varicose veins having never had deep venous thrombosis or venous intervention.  I discussed with her the importance of  elevating her legs when recumbent as well as compression stockings.  She will get fitted for those at [facility] and we will send information regarding compression stockings.  She will follow-up in 3 months with venous insufficiency study  Follow Up Instructions:  3 months with bilateral lower extremity venous insufficiency study.   I discussed the assessment and treatment plan with the patient. The patient was  provided an opportunity to ask questions and all were answered. The patient agreed with the plan and demonstrated an understanding of the instructions.   The patient was advised to call back or seek an in-person evaluation if the symptoms worsen or if the condition fails to improve as anticipated.  I spent 11 minutes with the patient via video encounter.   Signed, Servando Snare [facility] [place] Office: [phone]  11/04/2018, 10:47 AM

## 2018-11-04 NOTE — Telephone Encounter (Signed)
Virtual Visit Pre-Appointment Phone Call  Today, I spoke with [name] and performed the following actions:  1. I explained that we are currently trying to limit exposure to the COVID-19 virus by seeing patients at home rather than in the office.  I explained that the visits are best done by video, but can be done by telephone.  I asked the patient if a virtual visit that the patient would like to try instead of coming into the office. [name] agreed to proceed with the virtual visit scheduled with Dr. Donzetta Matters on Nov 04, 2018.    2. I confirmed the [facility]ST phone number to call the day of the visit and- I included this in appointment notes.  3. I asked if the patient had access to (through a family member/friend) a smartphone with video capability to be used for her visit?"  The patient said Yes.   4. I confirmed consent verbally as listed below (a-f). a. This visit is being performed in the setting of COVID-19. b. Virtual visits are billed to your insurance company just like a normal visit would be.   c. We'd like you to understand that the technology does not allow for your provider to perform an examination, and thus may limit your provider's ability to fully assess your condition.  d. If your provider identifies any concerns that need to be evaluated in person, we will make arrangements to do so.   e. Finally, though the technology is pretty good, we cannot assure that it will always work on either your or our end, and in the setting of a video visit, we may have to convert it to a phone-only visit.  In either situation, we cannot ensure that we have a secure connection.   f. Are you willing to proceed?" STAFF: Did the patient verbally acknowledge consent to telehealth visit? Document YES/NO here: YES  2. I advised the patient to be prepared - I asked that the patient, on the day of her visit, record any information possible with the equipment at her home, such as blood  pressure, pulse, oxygen saturation, and your weight and write them all down. I asked the patient to have a pen and paper handy nearby the day of the visit as well.  3. I informed the patient that the visit with the doctor would start with a text to the smartphone number given to [place] by the patient.   4. I Informed patient they will receive a phone call 15 minutes prior to their appointment time from a [place] or nurse to review medications, allergies, etc. to prepare for the visit.    TELEPHONE CALL NOTE  JENAYAH ANTU has been deemed a candidate for a follow-up tele-health visit to limit community exposure during the Covid-19 pandemic. I spoke with the patient via phone to ensure availability of phone/video source, confirm preferred email & phone number, and discuss instructions and expectations.  I reminded [name] to be prepared with any vital sign and/or heart rhythm information that could potentially be obtained via home monitoring, at the time of her visit. I reminded [name] to expect a phone call prior to her visit.  [name] 11/04/2018 10:08 AM     FULL LENGTH CONSENT FOR TELE-HEALTH VISIT   I hereby voluntarily request, consent and authorize [facility] and its employed or contracted physicians, Engineer, materials, nurse practitioners or other licensed health care professionals (the Practitioner), to provide  me with telemedicine health care services (the Services") as deemed necessary by the treating Practitioner. I acknowledge and consent to receive the Services by the Practitioner via telemedicine. I understand that the telemedicine visit will involve communicating with the Practitioner through live audiovisual communication technology and the disclosure of certain medical information by electronic transmission. I acknowledge that I have been given the opportunity to request an in-person assessment or other available alternative prior to the  telemedicine visit and am voluntarily participating in the telemedicine visit.  I understand that I have the right to withhold or withdraw my consent to the use of telemedicine in the course of my care at any time, without affecting my right to future care or treatment, and that the Practitioner or I may terminate the telemedicine visit at any time. I understand that I have the right to inspect all information obtained and/or recorded in the course of the telemedicine visit and may receive copies of available information for a reasonable fee.  I understand that some of the potential risks of receiving the Services via telemedicine include:   Delay or interruption in medical evaluation due to technological equipment failure or disruption;  Information transmitted may not be sufficient (e.g. poor resolution of images) to allow for appropriate medical decision making by the Practitioner; and/or   In rare instances, security protocols could fail, causing a breach of personal health information.  Furthermore, I acknowledge that it is my responsibility to provide information about my medical history, conditions and care that is complete and accurate to the best of my ability. I acknowledge that Practitioner's advice, recommendations, and/or decision may be based on factors not within their control, such as incomplete or inaccurate data provided by me or distortions of diagnostic images or specimens that may result from electronic transmissions. I understand that the practice of medicine is not an exact science and that Practitioner makes no warranties or guarantees regarding treatment outcomes. I acknowledge that I will receive a copy of this consent concurrently upon execution via email to the email address I last provided but may also request a printed copy by calling the office of [place].    I understand that my insurance will be billed for this visit.   I have read or had this consent read to  me.  I understand the contents of this consent, which adequately explains the benefits and risks of the Services being provided via telemedicine.   I have been provided ample opportunity to ask questions regarding this consent and the Services and have had my questions answered to my satisfaction.  I give my informed consent for the services to be provided through the use of telemedicine in my medical care  By participating in this telemedicine visit I agree to the above.

## 2018-11-29 ENCOUNTER — Other Ambulatory Visit: Payer: Self-pay | Admitting: Family Medicine

## 2018-11-29 MED FILL — CYCLOBENZAPRINE HCL 10 MG T: 10 | 30 days supply | Qty: 90 | Fill #0

## 2018-12-22 ENCOUNTER — Telehealth: Payer: Self-pay

## 2018-12-22 ENCOUNTER — Encounter: Payer: Self-pay | Admitting: Family Medicine

## 2018-12-22 DIAGNOSIS — I10 Essential (primary) hypertension: Secondary | ICD-10-CM

## 2018-12-22 DIAGNOSIS — E785 Hyperlipidemia, unspecified: Secondary | ICD-10-CM

## 2018-12-22 DIAGNOSIS — E663 Overweight: Secondary | ICD-10-CM

## 2018-12-22 DIAGNOSIS — Z8639 Personal history of other endocrine, nutritional and metabolic disease: Secondary | ICD-10-CM

## 2018-12-22 DIAGNOSIS — E559 Vitamin D deficiency, unspecified: Secondary | ICD-10-CM

## 2018-12-22 NOTE — Telephone Encounter (Signed)
Labs entered for annual exam

## 2019-01-24 DIAGNOSIS — Z20828 Contact with and (suspected) exposure to other viral communicable diseases: Secondary | ICD-10-CM | POA: Diagnosis not present

## 2019-01-31 ENCOUNTER — Other Ambulatory Visit: Payer: Self-pay

## 2019-01-31 DIAGNOSIS — I83893 Varicose veins of bilateral lower extremities with other complications: Secondary | ICD-10-CM

## 2019-02-08 ENCOUNTER — Telehealth (HOSPITAL_COMMUNITY): Payer: Self-pay | Admitting: *Deleted

## 2019-02-08 ENCOUNTER — Other Ambulatory Visit: Payer: Self-pay

## 2019-02-08 ENCOUNTER — Other Ambulatory Visit: Payer: Self-pay | Admitting: Family Medicine

## 2019-02-08 MED ORDER — HYDROCHLOROTHIAZIDE 12.5 MG PO CAPS: 12.5000 mg | ORAL_CAPSULE | Freq: Every day | ORAL | 0 refills | Status: DC

## 2019-02-08 NOTE — Telephone Encounter (Signed)
The above patient or their representative was contacted and gave the following answers to these questions:         Do you have any of the following symptoms?n  Fever                    Cough                   Shortness of breath  Do  you have any of the following other symptoms? n   muscle pain         vomiting,        diarrhea        rash         weakness        red eye        abdominal pain         bruising          bruising or bleeding              joint pain           severe headache    Have you been in contact with someone who was or has been sick in the past 2 weeks?n  Yes                 Unsure                         Unable to assess   Does the person that you were in contact with have any of the following symptoms?   Cough         shortness of breath           muscle pain         vomiting,            diarrhea            rash            weakness           fever            red eye           abdominal pain           bruising  or  bleeding                joint pain                severe headache               Have you  or someone you have been in contact with traveled internationally in th last month?         If yes, which countries?   Have you  or someone you have been in contact with traveled outside [place] in th last month?         If yes, which state and city?   COMMENTS OR ACTION PLAN FOR THIS PATIENT:         

## 2019-02-08 NOTE — Telephone Encounter (Signed)
The above patient or their representative was contacted and gave the following answers to these questions:         Do you have any of the following symptoms?n  Fever                    Cough                   Shortness of breath  Do  you have any of the following other symptoms? nn   muscle pain         vomiting,        diarrhea        rash         weakness        red eye        abdominal pain         bruising          bruising or bleeding              joint pain           severe headache    Have you been in contact with someone who was or has been sick in the past 2 weeks?n Yes                 Unsure                         Unable to assess   Does the person that you were in contact with have any of the following symptoms?   Cough         shortness of breath           muscle pain         vomiting,            diarrhea            rash            weakness           fever            red eye           abdominal pain           bruising  or  bleeding                joint pain                severe headache               Have you  or someone you have been in contact with traveled internationally in th last month?         If yes, which countries?   Have you  or someone you have been in contact with traveled outside [place] in th last month?         If yes, which state and city?   COMMENTS OR ACTION PLAN FOR THIS PATIENT:

## 2019-02-09 ENCOUNTER — Other Ambulatory Visit: Payer: Self-pay

## 2019-02-09 ENCOUNTER — Telehealth: Payer: Self-pay | Admitting: *Deleted

## 2019-02-09 ENCOUNTER — Ambulatory Visit (HOSPITAL_COMMUNITY)
Admission: RE | Admit: 2019-02-09 | Discharge: 2019-02-09 | Disposition: A | Discharge status: Home / Self Care | Payer: 59 | Source: Ambulatory Visit | Attending: Family | Admitting: Family

## 2019-02-09 ENCOUNTER — Encounter: Payer: Self-pay | Admitting: Vascular Surgery

## 2019-02-09 ENCOUNTER — Ambulatory Visit: Payer: 59 | Admitting: Vascular Surgery

## 2019-02-09 VITALS — BP 126/86 | HR 79 | Temp 98.7°F | Resp 16 | Ht 71.0 in | Wt 192.4 lb

## 2019-02-09 DIAGNOSIS — I83893 Varicose veins of bilateral lower extremities with other complications: Secondary | ICD-10-CM

## 2019-02-09 DIAGNOSIS — I872 Venous insufficiency (chronic) (peripheral): Secondary | ICD-10-CM | POA: Diagnosis not present

## 2019-02-09 MED FILL — LOSARTAN POTASSIUM 50 MG TA: 50 | 30 days supply | Qty: 30 | Fill #0

## 2019-02-09 NOTE — Telephone Encounter (Signed)
[name] outpatient pharmacy called about [name]. They received prescription for hydrochlorothiazide with a note that the medication was being split up due to being on back order. However they did not receive anything else. She assumed it was losartan but no prescription. She can be reached at [phone]

## 2019-02-09 NOTE — Progress Notes (Signed)
Patient name: [name] MRN: [id number] DOB: [birth year] Sex: female  REASON FOR VISIT:   Follow-up of bilateral painful varicose veins and chronic venous insufficiency.  HPI:   [name] is a pleasant 48 y.o. female who had a virtual visit via video with Dr. Servando Snare on 11/04/2018.  She has a history of bilateral lower extremity varicose veins that started after childbirth.  Patient denies any previous history of DVT.  She has never had any previous venous interventions.  The patient had C3 venous disease with bilateral lower extremity varicose veins.  The importance of leg elevation and compression stockings was recommended and the patient comes in for 85-month follow-up visit.  The patient describes a long history of varicose veins.  These all began when she had her first child 23 years ago.  She describes aching pain and heaviness in her legs which is aggravated by sitting and standing and relieved with elevation.  She also gets some burning pain in her legs.  She is developed some varicose veins along the medial aspect of her right thigh which are painful with sitting and standing.  She takes ibuprofen as needed for pain.  She has been wearing her thigh-high compression stockings but finds these hard to get on.  She is unaware of any previous history of DVT.  She is had no previous venous procedures.  Past Medical History:  Diagnosis Date  . Essential hypertension, benign   . Migraine headache 2007  . Obesity    Since childhood   . Pneumonia 2007  . Spinal disease   . Supraventricular tachycardia [facility])    Status post RFA July 2015 - Dr. Lovena Le  . Type 2 diabetes mellitus ([place])   . Weight loss    Hopitalized 4 years ago after loosing 200lb on a supervised program, malnurished with severe protein deficiency     Family History  Problem Relation Age of Onset  . Hypertension Mother   . Diabetes Mother   . Cancer Mother        Uterine   . Heart failure Mother         CABG  . Cancer Father 7       Prostate   . Hypertension Father   . Bradycardia Father        Irregular heart beat   . Diabetes Brother   . Diabetes Brother   . Hypertension Brother   . Hypertension Brother     SOCIAL HISTORY: Social History   Tobacco Use  . Smoking status: Former Smoker    Packs/day: 0.50    Years: 7.00    Pack years: 3.50    Types: Cigarettes    Quit date: 08/05/2012    Years since quitting: 6.5  . Smokeless tobacco: Never Used  . Tobacco comment: quit in 2014  Substance Use Topics  . Alcohol use: No    Alcohol/week: 0.0 standard drinks    Allergies  Allergen Reactions  . Codeine Nausea And Vomiting  . Metoclopramide Hcl Other (See Comments)    Not in right state of mind    Current Outpatient Medications  Medication Sig Dispense Refill  . acetaminophen (TYLENOL) 500 MG tablet Take 1,000 mg by mouth every 6 (six) hours as needed (for pain).     . Cholecalciferol (VITAMIN D-1000 MAX ST) 1000 units tablet Take by mouth.    . cyclobenzaprine (FLEXERIL) 10 MG tablet TAKE 1 TABLET (10 MG TOTAL) BY MOUTH 3 (THREE) TIMES DAILY AS  NEEDED FOR MUSCLE SPASMS. 90 tablet 0  . famotidine (PEPCID) 40 MG tablet Take one tablet once daily , when you take ibuprofen 30 tablet 0  . fluticasone (FLONASE) 50 MCG/ACT nasal spray Place 2 sprays into both nostrils daily as needed for allergies or rhinitis.    . hydrochlorothiazide (MICROZIDE) 12.5 MG capsule Take 1 capsule (12.5 mg total) by mouth daily. 90 capsule 0  . ibuprofen (ADVIL,MOTRIN) 600 MG tablet Take one tablet three times daily for 5 days, then as needed, for back pain 30 tablet 0  . losartan (COZAAR) 50 MG tablet TAKE 1 TABLET BY MOUTH DAILY. 90 tablet 0   No current facility-administered medications for this visit.     REVIEW OF SYSTEMS:  [X]  denotes positive finding, [ ]  denotes negative finding Cardiac  Comments:  Chest pain or chest pressure:    Shortness of breath upon exertion:    Short of  breath when lying flat:    Irregular heart rhythm:        Vascular    Pain in calf, thigh, or hip brought on by ambulation:    Pain in feet at night that wakes you up from your sleep:     Blood clot in your veins:    Leg swelling:         Pulmonary    Oxygen at home:    Productive cough:     Wheezing:         Neurologic    Sudden weakness in arms or legs:     Sudden numbness in arms or legs:     Sudden onset of difficulty speaking or slurred speech:    Temporary loss of vision in one eye:     Problems with dizziness:         Gastrointestinal    Blood in stool:     Vomited blood:         Genitourinary    Burning when urinating:     Blood in urine:        Psychiatric    Major depression:         Hematologic    Bleeding problems:    Problems with blood clotting too easily:        Skin    Rashes or ulcers:        Constitutional    Fever or chills:     PHYSICAL EXAM:   Vitals:   02/09/19 1427  BP: 126/86  Pulse: 79  Resp: 16  Temp: 98.7 F (37.1 C)  TempSrc: Temporal  SpO2: 100%  Weight: 192 lb 6.4 oz (87.3 kg)  Height: 5\' 11"  (1.803 m)    GENERAL: The patient is a well-nourished female, in no acute distress. The vital signs are documented above. CARDIAC: There is a regular rate and rhythm.  VASCULAR: I do not detect carotid bruits. I was unable to palpate pedal pulses. On the right side she has a biphasic dorsalis pedis and posterior tibial signal. On the left side she has a biphasic dorsalis pedis signal.  I was unable to obtain a posterior tibial signal. She has a varicose vein on the medial aspect of her right thigh.  Currently she has no significant leg swelling.  She does not have any significant hyperpigmentation.  PULMONARY: There is good air exchange bilaterally without wheezing or rales. ABDOMEN: Soft and non-tender with normal pitched bowel sounds.  MUSCULOSKELETAL: There are no major deformities or cyanosis. NEUROLOGIC: No focal weakness or  paresthesias  are detected. SKIN: There are no ulcers or rashes noted. PSYCHIATRIC: The patient has a normal affect.  DATA:    VENOUS DUPLEX: I have independently interpreted her venous duplex scan today.  On the right side there is no evidence of DVT.  There is deep venous reflux involving the common femoral vein.  There is superficial venous reflux in the proximal right small saphenous vein only.  On the left side there is no evidence of DVT.  There is deep venous reflux involving the common femoral vein.  There is no significant superficial venous reflux except for reflux at the saphenofemoral junction.  MEDICAL ISSUES:   CHRONIC VENOUS INSUFFICIENCY: This patient has deep venous reflux bilaterally with no significant superficial venous reflux.  She has CEAP C2 venous disease.  I have discussed with her the importance of intermittent leg elevation and the proper positioning for this.  I have given her a prescription for knee-high compression stockings with a gradient of 15 to 20 mmHg and she is having a harder time with the thigh-high 20-30 stockings.  I have encouraged her to avoid prolonged sitting and standing.  We discussed the importance of exercise specifically walking and water aerobics.  If her symptoms progress in the future and certainly we can reevaluate her but currently she is not a candidate for laser ablation on either side.  She will call if her symptoms progress.  Deitra Mayo [facility] [phone]

## 2019-02-09 NOTE — Telephone Encounter (Signed)
Called pharmacy and let them know losartan has been sent in

## 2019-02-10 ENCOUNTER — Other Ambulatory Visit: Payer: Self-pay | Admitting: Family Medicine

## 2019-02-11 ENCOUNTER — Other Ambulatory Visit: Payer: Self-pay | Admitting: Family Medicine

## 2019-02-15 ENCOUNTER — Other Ambulatory Visit: Payer: Self-pay | Admitting: Family Medicine

## 2019-02-16 ENCOUNTER — Other Ambulatory Visit: Payer: Self-pay | Admitting: Family Medicine

## 2019-02-24 ENCOUNTER — Other Ambulatory Visit: Payer: Self-pay | Admitting: Family Medicine

## 2019-02-24 ENCOUNTER — Encounter: Payer: Self-pay | Admitting: Family Medicine

## 2019-02-28 ENCOUNTER — Other Ambulatory Visit: Payer: Self-pay

## 2019-02-28 MED ORDER — HYDROCHLOROTHIAZIDE 12.5 MG PO CAPS: 12.5000 mg | ORAL_CAPSULE | Freq: Every day | ORAL | 1 refills | Status: DC

## 2019-02-28 MED ORDER — LOSARTAN POTASSIUM 50 MG PO TABS: 50.0000 mg | ORAL_TABLET | Freq: Every day | ORAL | 1 refills | Status: DC

## 2019-02-28 MED ORDER — CYCLOBENZAPRINE HCL 10 MG PO TABS: 10.0000 mg | ORAL_TABLET | Freq: Three times a day (TID) | ORAL | 0 refills | Status: DC | PRN

## 2019-02-28 MED FILL — HYDROCHLOROTHIAZIDE 12.5 MG: 12.5 | 90 days supply | Qty: 90 | Fill #0

## 2019-02-28 MED FILL — CYCLOBENZAPRINE HCL 10 MG T: 10 | 90 days supply | Qty: 90 | Fill #0

## 2019-02-28 MED FILL — LOSARTAN POTASSIUM 50 MG TA: 50 | 30 days supply | Qty: 30 | Fill #0

## 2019-03-01 ENCOUNTER — Telehealth: Payer: Self-pay | Admitting: *Deleted

## 2019-03-01 ENCOUNTER — Encounter: Payer: Self-pay | Admitting: Family Medicine

## 2019-03-01 NOTE — Telephone Encounter (Signed)
Pt called said she had a message that her prescriptions were sent to [facility] but when she called they didn't have any of the medications. She would like a call back when these are sent in

## 2019-03-01 NOTE — Telephone Encounter (Signed)
Called and verified they DID have them filled and made patient aware

## 2019-03-15 DIAGNOSIS — E785 Hyperlipidemia, unspecified: Secondary | ICD-10-CM | POA: Diagnosis not present

## 2019-03-15 DIAGNOSIS — E663 Overweight: Secondary | ICD-10-CM | POA: Diagnosis not present

## 2019-03-15 DIAGNOSIS — E559 Vitamin D deficiency, unspecified: Secondary | ICD-10-CM | POA: Diagnosis not present

## 2019-03-15 DIAGNOSIS — I10 Essential (primary) hypertension: Secondary | ICD-10-CM | POA: Diagnosis not present

## 2019-03-15 DIAGNOSIS — Z8639 Personal history of other endocrine, nutritional and metabolic disease: Secondary | ICD-10-CM | POA: Diagnosis not present

## 2019-03-16 ENCOUNTER — Encounter: Payer: Self-pay | Admitting: Family Medicine

## 2019-03-16 LAB — LIPID PANEL
Cholesterol: 203 mg/dL — ABNORMAL HIGH (ref <200)
HDL: 81 mg/dL (ref >=50)
LDL Cholesterol (Calc): 105 mg/dL (calc) — ABNORMAL HIGH
Non-HDL Cholesterol (Calc): 122 mg/dL (calc) (ref <130)
Total CHOL/HDL Ratio: 2.5 (calc) (ref <5.0)
Triglycerides: 76 mg/dL (ref <150)

## 2019-03-16 LAB — COMPLETE METABOLIC PANEL WITH GFR
AG Ratio: 1.2 (calc) (ref 1.0–2.5)
ALT: 8 U/L (ref 6–29)
AST: 13 U/L (ref 10–35)
Albumin: 4.7 g/dL (ref 3.6–5.1)
Alkaline phosphatase (APISO): 60 U/L (ref 31–125)
BUN: 14 mg/dL (ref 7–25)
CO2: 29 mmol/L (ref 20–32)
Calcium: 10.1 mg/dL (ref 8.6–10.2)
Chloride: 102 mmol/L (ref 98–110)
Creat: 0.8 mg/dL (ref 0.50–1.10)
GFR, Est African American: 100 mL/min/{1.73_m2} (ref >=60)
GFR, Est Non African American: 87 mL/min/{1.73_m2} (ref >=60)
Globulin: 4 g/dL (calc) — ABNORMAL HIGH (ref 1.9–3.7)
Glucose, Bld: 78 mg/dL (ref 65–99)
Potassium: 4.2 mmol/L (ref 3.5–5.3)
Sodium: 137 mmol/L (ref 135–146)
Total Bilirubin: 0.3 mg/dL (ref 0.2–1.2)
Total Protein: 8.7 g/dL — ABNORMAL HIGH (ref 6.1–8.1)

## 2019-03-16 LAB — HEMOGLOBIN A1C
Hgb A1c MFr Bld: 5.4 % of total Hgb (ref <5.7)
Mean Plasma Glucose: 108 (calc)
eAG (mmol/L): 6 (calc)

## 2019-03-16 LAB — TSH: TSH: 1.64 mIU/L

## 2019-03-16 LAB — VITAMIN D 25 HYDROXY (VIT D DEFICIENCY, FRACTURES): Vit D, 25-Hydroxy: 26 ng/mL — ABNORMAL LOW (ref 30–100)

## 2019-03-23 ENCOUNTER — Other Ambulatory Visit: Payer: Self-pay

## 2019-03-23 ENCOUNTER — Ambulatory Visit (INDEPENDENT_AMBULATORY_CARE_PROVIDER_SITE_OTHER): Payer: 59 | Admitting: Family Medicine

## 2019-03-23 ENCOUNTER — Encounter: Payer: Self-pay | Admitting: Family Medicine

## 2019-03-23 VITALS — BP 114/82 | HR 85 | Temp 97.9°F | Resp 15 | Ht 71.0 in | Wt 192.0 lb

## 2019-03-23 DIAGNOSIS — Z23 Encounter for immunization: Secondary | ICD-10-CM

## 2019-03-23 DIAGNOSIS — M5430 Sciatica, unspecified side: Secondary | ICD-10-CM | POA: Diagnosis not present

## 2019-03-23 DIAGNOSIS — M549 Dorsalgia, unspecified: Secondary | ICD-10-CM

## 2019-03-23 DIAGNOSIS — Z0001 Encounter for general adult medical examination with abnormal findings: Secondary | ICD-10-CM | POA: Diagnosis not present

## 2019-03-23 DIAGNOSIS — Z0000 Encounter for general adult medical examination without abnormal findings: Secondary | ICD-10-CM

## 2019-03-23 MED ORDER — CYCLOBENZAPRINE HCL 10 MG PO TABS: 10.0000 mg | ORAL_TABLET | Freq: Every day | ORAL | 0 refills | Status: DC

## 2019-03-23 MED ORDER — IBUPROFEN 600 MG PO TABS: ORAL_TABLET | ORAL | 3 refills | Status: DC

## 2019-03-23 MED FILL — IBUPROFEN 600 MG TABLET: 600 | 90 days supply | Qty: 36 | Fill #0

## 2019-03-23 NOTE — Patient Instructions (Addendum)
Annual exam with pap in mid August  20221, call if you need me before, shingrix  # 1 at visit  Flu vaccine today  pls send a msg early next year for referral for colonoscopy  pls schedule your mammogram due in May 2021  Congrats on great lifestyle and weight management , keep it up  It is important that you exercise regularly at least 30 minutes 5 times a week. If you develop chest pain, have severe difficulty breathing, or feel very tired, stop exercising immediately and seek medical attention    Thanks for choosing [facility], we consider it a privelige to serve you.

## 2019-03-24 ENCOUNTER — Encounter: Payer: Self-pay | Admitting: Family Medicine

## 2019-03-24 MED FILL — LOSARTAN POTASSIUM 50 MG TA: 50 | 30 days supply | Qty: 30 | Fill #1

## 2019-03-24 NOTE — Assessment & Plan Note (Signed)
Increased paain in past 2 months. Ibuprofen for acute flare added, educated re need to se sparingly as she has in the past. She will continue tylenol and Zanaflex as before

## 2019-03-24 NOTE — Progress Notes (Signed)
    [name]     MRN: [id number]      DOB: [birth year]  HPI: Patient is in for annual physical exam. C/o increased low back pain with change in weather . Generally uses bedtime musclerelaxant with tylenol, however ,asking for medication she can use during the ay for relief , has benefited from iuprofen in the past which she uses sparingly. No new weakness , numbness or incontinece, no inciting trauma Recent labs,are reviewed. Immunization is reviewed , and  updated.   PE: BP 114/82   Pulse 85   Temp 97.9 F (36.6 C) (Temporal)   Resp 15   Ht 5\' 11"  (1.803 m)   Wt 192 lb (87.1 kg)   SpO2 97%   BMI 26.78 kg/m   Pleasant  female, alert and oriented x 3, in no cardio-pulmonary distress. Afebrile. HEENT No facial trauma or asymetry. Sinuses non tender.  Extra occullar muscles intact, External ears normal, Neck: supple, no adenopathy,JVD or thyromegaly.No bruits.  Chest: Clear to ascultation bilaterally.No crackles or wheezes. Non tender to palpation  Breast: No asymetry,no masses or lumps. No tenderness. No nipple discharge or inversion. No axillary or supraclavicular adenopathy  Cardiovascular system; Heart sounds normal,  S1 and  S2 ,no S3.  No murmur, or thrill. Apical beat not displaced Peripheral pulses normal.  Abdomen: Soft, non tender, no organomegaly or masses. No bruits. Bowel sounds normal. No guarding, tenderness or rebound.     Musculoskeletal exam: Slightly decreased  ROM of lumbar  spine, hips , adequate in shoulders and knees. No deformity ,swelling or crepitus noted. No muscle wasting or atrophy.   Neurologic: Cranial nerves 2 to 12 intact. Power, tone ,sensation normal throughout. No disturbance in gait. No tremor.  Skin: Intact, no ulceration, erythema , scaling or rash noted. Pigmentation normal throughout  Psych; Normal mood and affect. Judgement and concentration normal   Assessment & Plan:  Annual physical exam Annual  exam as documented. Counseling done  re healthy lifestyle involving commitment to 150 minutes exercise per week, heart healthy diet, and attaining healthy weight.The importance of adequate sleep also discussed. Regular seat belt use and home safety, is also discussed. Changes in health habits are decided on by the patient with goals and time frames  set for achieving them. Immunization and cancer screening needs are specifically addressed at this visit.   Back pain with sciatica Increased paain in past 2 months. Ibuprofen for acute flare added, educated re need to se sparingly as she has in the past. She will continue tylenol and Zanaflex as before

## 2019-03-24 NOTE — Assessment & Plan Note (Signed)

## 2019-04-27 MED FILL — LOSARTAN POTASSIUM 50 MG TA: 50 | 90 days supply | Qty: 90 | Fill #2

## 2019-05-23 MED FILL — HYDROCHLOROTHIAZIDE 12.5 MG: 12.5 | 90 days supply | Qty: 90 | Fill #1

## 2019-06-28 ENCOUNTER — Encounter: Payer: Self-pay | Admitting: Family Medicine

## 2019-06-30 ENCOUNTER — Encounter: Payer: Self-pay | Admitting: Internal Medicine

## 2019-08-16 MED FILL — HYDROCHLOROTHIAZIDE 12.5 MG: 12.5 | 90 days supply | Qty: 90 | Fill #0

## 2019-08-16 MED FILL — LOSARTAN POTASSIUM 50 MG TA: 50 | 30 days supply | Qty: 30 | Fill #3

## 2019-08-17 ENCOUNTER — Ambulatory Visit (INDEPENDENT_AMBULATORY_CARE_PROVIDER_SITE_OTHER): Payer: Self-pay | Admitting: *Deleted

## 2019-08-17 DIAGNOSIS — Z1211 Encounter for screening for malignant neoplasm of colon: Secondary | ICD-10-CM

## 2019-08-17 NOTE — Progress Notes (Addendum)
Gastroenterology Pre-Procedure Review  Request Date: 08/17/2019 Requesting Physician: Dr. Moshe Cipro, no previous TCS  PATIENT REVIEW QUESTIONS: The patient responded to the following health history questions as indicated:    1. Diabetes Melitis: no 2. Joint replacements in the past 12 months: no 3. Major health problems in the past 3 months: no 4. Has an artificial valve or MVP: no 5. Has a defibrillator: no 6. Has been advised in past to take antibiotics in advance of a procedure like teeth cleaning: no 7. Family history of colon cancer: no  8. Alcohol Use: no 9. Illicit drug Use: no 10. History of sleep apnea: no  11. History of coronary artery or other vascular stents placed within the last 12 months: no 12. History of any prior anesthesia complications: yes, n/v 13. There is no height or weight on file to calculate BMI. ht:5'11 wt: 192 lbs    MEDICATIONS & ALLERGIES:    Patient reports the following regarding taking any blood thinners:   Plavix? no Aspirin? no Coumadin? no Brilinta? no Xarelto? no Eliquis? no Pradaxa? no Savaysa? no Effient? no  Patient confirms/reports the following medications:  Current Outpatient Medications  Medication Sig Dispense Refill  . acetaminophen (TYLENOL) 500 MG tablet Take 1,000 mg by mouth every 6 (six) hours as needed (for pain).     . Cholecalciferol (VITAMIN D-1000 MAX ST) 1000 units tablet Take by mouth daily.     . cyclobenzaprine (FLEXERIL) 10 MG tablet Take 1 tablet (10 mg total) by mouth at bedtime. 90 tablet 0  . famotidine (PEPCID) 40 MG tablet Take one tablet once daily , when you take ibuprofen (Patient taking differently: as needed. Take one tablet once daily , when you take ibuprofen) 30 tablet 0  . fluticasone (FLONASE) 50 MCG/ACT nasal spray Place 2 sprays into both nostrils daily as needed for allergies or rhinitis.    . hydrochlorothiazide (MICROZIDE) 12.5 MG capsule Take 1 capsule (12.5 mg total) by mouth daily. 90 capsule  1  . ibuprofen (ADVIL) 600 MG tablet Take one tablet by mouth once daily, as needed, for uncontrolled back pain (Patient taking differently: as needed. Take one tablet by mouth once daily, as needed, for uncontrolled back pain) 36 tablet 3  . losartan (COZAAR) 50 MG tablet Take 1 tablet (50 mg total) by mouth daily. 90 tablet 1   No current facility-administered medications for this visit.    Patient confirms/reports the following allergies:  Allergies  Allergen Reactions  . Codeine Nausea And Vomiting  . Metoclopramide Hcl Other (See Comments)    Not in right state of mind    No orders of the defined types were placed in this encounter.   AUTHORIZATION INFORMATION Primary Insurance: [employer] [place],  [place] #[id number] ,  Group #:[id number] Pre-Cert / Auth required: Yes, per Angie, approved [id number] Pre-Cert / Auth #: [id number]  SCHEDULE INFORMATION: Procedure has been scheduled as follows:  Date: 11/17/2019, Time: 7:30 Location: [facility] with Dr. Gala Romney  This Gastroenterology Pre-Precedure Review Form is being routed to the following provider(s): Neil Crouch, PA-C

## 2019-08-17 NOTE — Progress Notes (Signed)
Pt requested to have her procedure in August when she turns 49 years old.  Informed pt that I would call her back once August schedules have been released.  Pt voiced understanding.  Will update pt's medications and medical hx when I call her back to schedule her procedure.

## 2019-08-21 NOTE — Progress Notes (Signed)
Angie, her insurance should cover screening colonoscopy after age 50 based on her race (recommended initial screening colonoscopy at age 10 for African Americans). She can call and check with them and if she decides to proceed, we can schedule.

## 2019-08-22 MED ORDER — CLENPIQ 10-3.5-12 MG-GM -GM/160ML PO SOLN: 1.0000 | Freq: Once | ORAL | 0 refills | Status: AC

## 2019-08-22 MED FILL — CLENPIQ 10-3.5-12 MG-GM -GM: 10-3.5-12 M | 2 days supply | Qty: 320 | Fill #0

## 2019-08-22 NOTE — Progress Notes (Signed)
Ok to schedule.

## 2019-08-22 NOTE — Patient Instructions (Signed)
CLENPIQ SPLIT PREP INSTRUCTIONS   Patient Name:  [name] Date of procedure: 11/17/2019 Time to register at [facility]: 6:30 am Provider:  Dr. Gala Romney   Please notify us immediately if you are diabetic, take iron supplements, or if you are on coumadin or any blood thinners.  Please hold the following medications: n/a  Note: Do NOT refrigerate or freeze CLENPIQ. CLENPIQ is ready to drink. There is no need to add any other liquid or mix the medicine in the bottle before you start dosing.   11/16/2019-  1 Day prior to procedure:     CLEAR LIQUIDS ALL DAY--NO SOLID FOODS OR DAIRY PRODUCTS! See list of liquids that are allowed and items that are NOT allowed below.    Diabetic Medication Instructions:  n/a   You must drink plenty of CLEAR LIQUIDS starting before your bowel prep. It is important to stay adequately hydrated before, during, and after your bowel prep for the prep to work effectively!    At 5:00 PM Begin the prep as follows:    1. Drink one bottle of premixed CLENPIQ right from the bottle. 2. Drink at least five (5) 8-ounce drinks of clear liquids of your choice within the next 5 hours   Continue clear liquids.    11/17/2019-  Day of Procedure   Diabetic Medication Instructions: n/a   You may take TYLENOL products. Please continue your regular medications unless we have instructed you otherwise.    5 hours before procedure @ 2:30 am:  1. Drink second bottle of premixed CLENPIQ right from the bottle.   2. Drink at least three (3) 8-ounce drinks of clear liquids of your choice within the next 2 hours. You can drink more if needed.   3 hours before your procedure time @ 4:30 am: Stop drinking all liquids, nothing by mouth at this point.  Please note, on the day of your procedure you MUST be accompanied by an adult who is willing to assume responsibility for you at time of discharge. If you do not have such person with you, your procedure will have  to be rescheduled.                                                                                                                     Please leave ALL jewelry at home prior to coming to the hospital for your procedure.   *It is your responsibility to check with your insurance company for the benefits of coverage you have for this procedure. Unfortunately, not all insurance companies have benefits to cover all or part of these types of procedures. It is your responsibility to check your benefits, however we will be glad to assist you with any codes your insurance company may need.   Please note that most insurance companies will not cover a screening colonoscopy for people under the age of 57  For example, with some insurance companies you may have benefits for a screening colonoscopy, but if polyps  are found the diagnosis will change and then you may have a deductible that will need to be met. Please make sure you check your benefits for screening colonoscopy as well as a diagnostic colonoscopy.   CLEAR LIQUIDS: (NO RED or PURPLE) Water  Jello   Apple Juice  White Grape Juice   Kool-Aid Soft drinks  Banana popsicles Sports Drink  Black coffee (No cream or milk) Tea (No cream or milk)  Broth (fat free beef/chicken/vegetable)  Clear liquids allow you to see your fingers on the other side of the glass.  Be sure they are NOT RED or PURPLE in color, cloudy, but CLEAR.  Do Not Eat: Dairy products of any kind Cranberry juice Tomato or V8 Juice  Orange Juice   Grapefruit Juice Red Grape Juice Alcohol   Non-dairy creamer Solid foods like cereal, oatmeal, yogurt, fruits, vegetables, creamed soups, eggs, bread, etc   HELPFUL HINTS TO MAKE DRINKING EASIER: -Trying drinking through a straw. -If you become nauseated, try consuming smaller amounts or stretch out the time between glasses.  Stop for 30 minutes & slowly start back drinking.  Call our office with any questions or concerns at  [phone].  Thank You,  '@SIGNATURE' @

## 2019-08-22 NOTE — Progress Notes (Signed)
Pt decided to proceed with scheduling her procedure.  She scheduled it for 11/17/2019.  Pt is aware that she can contact her insurance company if she wants to verify the information that I relayed to her about screening colonoscopies at age 50 for African Americans (per LSL's note).  Pt is aware that I will mail her out all prep information.  Pt voiced understanding.

## 2019-08-22 NOTE — Addendum Note (Signed)
Addended by: Metro Kung on: 08/22/2019 01:32 PM   Modules accepted: Orders

## 2019-08-23 ENCOUNTER — Telehealth: Payer: Self-pay | Admitting: Internal Medicine

## 2019-08-23 ENCOUNTER — Other Ambulatory Visit (HOSPITAL_COMMUNITY): Payer: Self-pay | Admitting: Family Medicine

## 2019-08-23 ENCOUNTER — Telehealth: Payer: Self-pay | Admitting: *Deleted

## 2019-08-23 DIAGNOSIS — Z1231 Encounter for screening mammogram for malignant neoplasm of breast: Secondary | ICD-10-CM

## 2019-08-23 NOTE — Telephone Encounter (Signed)
Pt said that she called her insurance company and they told her that her procedure would need clinical documentation sent in order to see if it will be covered due to age.  Pt aware that I spoke with Angie from [facility] this morning and faxed the information requested.  I will follow up with pt once I know something.  Pt voiced understanding.

## 2019-08-23 NOTE — Telephone Encounter (Signed)
PLEASE CALL PATIENT, SHE NEEDS TO GO OVER SOMETHING WITH YOU ABOUT HER INSURANCE

## 2019-08-23 NOTE — Telephone Encounter (Signed)
Called [employer] and spoke to [place].  Pre-cert is required due to age.  Faxed over clinical notes to nurse Coralyn Mark for review.  Pre-cert is pending for now.  Ref# for call: [phone]

## 2019-08-24 ENCOUNTER — Other Ambulatory Visit: Payer: Self-pay | Admitting: *Deleted

## 2019-08-24 NOTE — Telephone Encounter (Signed)
Received fax today that procedure has been approved 11/17/2019-12/15/2019.  Auth#: [id number]  Scanned into Epic.

## 2019-08-25 ENCOUNTER — Telehealth: Payer: Self-pay | Admitting: Internal Medicine

## 2019-08-25 NOTE — Telephone Encounter (Signed)
Called pt back and informed her that her procedure had been approved.  Informed her that we received a fax from [facility].  Scanned into Epic.

## 2019-08-25 NOTE — Telephone Encounter (Signed)
Pt called for AS. She has questions about her procedure, [phone]

## 2019-10-04 ENCOUNTER — Encounter: Payer: Self-pay | Admitting: Family Medicine

## 2019-10-06 ENCOUNTER — Other Ambulatory Visit: Payer: Self-pay | Admitting: Family Medicine

## 2019-10-06 MED ORDER — CYCLOBENZAPRINE HCL 10 MG PO TABS: ORAL_TABLET | ORAL | 0 refills | Status: DC

## 2019-10-06 MED FILL — CYCLOBENZAPRINE HCL 10 MG T: 10 | 90 days supply | Qty: 90 | Fill #0

## 2019-10-10 LAB — HM DIABETES EYE EXAM

## 2019-10-20 ENCOUNTER — Other Ambulatory Visit: Payer: Self-pay | Admitting: Family Medicine

## 2019-10-20 MED FILL — LOSARTAN POTASSIUM 50 MG TA: 50 | 90 days supply | Qty: 90 | Fill #0

## 2019-10-27 MED FILL — HYDROCHLOROTHIAZIDE 12.5 MG: 12.5 | 90 days supply | Qty: 90 | Fill #0

## 2019-11-02 ENCOUNTER — Encounter: Payer: Self-pay | Admitting: *Deleted

## 2019-11-03 ENCOUNTER — Other Ambulatory Visit: Payer: Self-pay

## 2019-11-03 ENCOUNTER — Ambulatory Visit (HOSPITAL_COMMUNITY)
Admission: RE | Admit: 2019-11-03 | Discharge: 2019-11-03 | Disposition: A | Discharge status: Home / Self Care | Payer: No Typology Code available for payment source | Source: Ambulatory Visit | Attending: Family Medicine | Admitting: Family Medicine

## 2019-11-03 DIAGNOSIS — Z1231 Encounter for screening mammogram for malignant neoplasm of breast: Secondary | ICD-10-CM | POA: Insufficient documentation

## 2019-11-07 ENCOUNTER — Encounter: Payer: No Typology Code available for payment source | Admitting: Family Medicine

## 2019-11-14 ENCOUNTER — Other Ambulatory Visit: Payer: Self-pay

## 2019-11-14 ENCOUNTER — Other Ambulatory Visit (HOSPITAL_COMMUNITY)
Admission: RE | Admit: 2019-11-14 | Discharge: 2019-11-14 | Disposition: A | Discharge status: Home / Self Care | Payer: No Typology Code available for payment source | Source: Ambulatory Visit | Attending: Internal Medicine | Admitting: Internal Medicine

## 2019-11-14 ENCOUNTER — Other Ambulatory Visit (HOSPITAL_COMMUNITY): Payer: No Typology Code available for payment source

## 2019-11-14 DIAGNOSIS — Z20822 Contact with and (suspected) exposure to covid-19: Secondary | ICD-10-CM | POA: Insufficient documentation

## 2019-11-14 DIAGNOSIS — Z01812 Encounter for preprocedural laboratory examination: Secondary | ICD-10-CM | POA: Insufficient documentation

## 2019-11-15 LAB — SARS CORONAVIRUS 2 (TAT 6-24 HRS): SARS Coronavirus 2: NEGATIVE

## 2019-11-17 ENCOUNTER — Encounter: Payer: Self-pay | Admitting: Internal Medicine

## 2019-11-17 ENCOUNTER — Encounter (HOSPITAL_COMMUNITY): Payer: Self-pay | Admitting: Internal Medicine

## 2019-11-17 ENCOUNTER — Encounter (HOSPITAL_COMMUNITY)
Admission: RE | Disposition: A | Discharge status: Home / Self Care | Payer: Self-pay | Source: Home / Self Care | Attending: Internal Medicine

## 2019-11-17 ENCOUNTER — Ambulatory Visit (HOSPITAL_COMMUNITY)
Admission: RE | Admit: 2019-11-17 | Discharge: 2019-11-17 | Disposition: A | Discharge status: Home / Self Care | Payer: No Typology Code available for payment source | Attending: Internal Medicine | Admitting: Internal Medicine

## 2019-11-17 ENCOUNTER — Other Ambulatory Visit: Payer: Self-pay

## 2019-11-17 DIAGNOSIS — Z791 Long term (current) use of non-steroidal anti-inflammatories (NSAID): Secondary | ICD-10-CM | POA: Insufficient documentation

## 2019-11-17 DIAGNOSIS — Z87891 Personal history of nicotine dependence: Secondary | ICD-10-CM | POA: Diagnosis not present

## 2019-11-17 DIAGNOSIS — E669 Obesity, unspecified: Secondary | ICD-10-CM | POA: Insufficient documentation

## 2019-11-17 DIAGNOSIS — I10 Essential (primary) hypertension: Secondary | ICD-10-CM | POA: Diagnosis not present

## 2019-11-17 DIAGNOSIS — E119 Type 2 diabetes mellitus without complications: Secondary | ICD-10-CM | POA: Diagnosis not present

## 2019-11-17 DIAGNOSIS — Z1211 Encounter for screening for malignant neoplasm of colon: Secondary | ICD-10-CM | POA: Insufficient documentation

## 2019-11-17 DIAGNOSIS — Z6826 Body mass index (BMI) 26.0-26.9, adult: Secondary | ICD-10-CM | POA: Diagnosis not present

## 2019-11-17 HISTORY — PX: COLONOSCOPY: SHX5424

## 2019-11-17 SURGERY — COLONOSCOPY
Anesthesia: Moderate Sedation

## 2019-11-17 MED ORDER — MEPERIDINE HCL 100 MG/ML IJ SOLN
INTRAMUSCULAR | Status: DC | PRN
  Administered 2019-11-17: 25 mg via INTRAVENOUS
  Administered 2019-11-17: 15 mg via INTRAVENOUS

## 2019-11-17 MED ORDER — FLUMAZENIL 0.5 MG/5ML IV SOLN
INTRAVENOUS | Status: AC
  Filled 2019-11-17: qty 5

## 2019-11-17 MED ORDER — SODIUM CHLORIDE 0.9 % IV SOLN: INTRAVENOUS | Status: DC

## 2019-11-17 MED ORDER — MEPERIDINE HCL 50 MG/ML IJ SOLN
INTRAMUSCULAR | Status: AC
  Filled 2019-11-17: qty 1

## 2019-11-17 MED ORDER — STERILE WATER FOR IRRIGATION IR SOLN
Status: DC | PRN
  Administered 2019-11-17: 1.5 mL

## 2019-11-17 MED ORDER — ATROPINE SULFATE 1 MG/ML IJ SOLN
INTRAMUSCULAR | Status: AC
  Filled 2019-11-17: qty 1

## 2019-11-17 MED ORDER — ONDANSETRON HCL 4 MG/2ML IJ SOLN
INTRAMUSCULAR | Status: AC
  Filled 2019-11-17: qty 2

## 2019-11-17 MED ORDER — NALOXONE HCL 0.4 MG/ML IJ SOLN
INTRAMUSCULAR | Status: AC
  Filled 2019-11-17: qty 1

## 2019-11-17 MED ORDER — ONDANSETRON HCL 4 MG/2ML IJ SOLN
INTRAMUSCULAR | Status: DC | PRN
  Administered 2019-11-17: 4 mg via INTRAVENOUS

## 2019-11-17 MED ORDER — MIDAZOLAM HCL 5 MG/5ML IJ SOLN
INTRAMUSCULAR | Status: DC | PRN
  Administered 2019-11-17 (×4): 1 mg via INTRAVENOUS
  Administered 2019-11-17 (×2): 2 mg via INTRAVENOUS

## 2019-11-17 MED ORDER — EPINEPHRINE 1 MG/10ML IJ SOSY
PREFILLED_SYRINGE | INTRAMUSCULAR | Status: AC
  Filled 2019-11-17: qty 10

## 2019-11-17 MED ORDER — MIDAZOLAM HCL 5 MG/5ML IJ SOLN
INTRAMUSCULAR | Status: AC
  Filled 2019-11-17: qty 10

## 2019-11-17 NOTE — Op Note (Signed)
[facility] Patient Name: [name] Procedure Date: 11/17/2019 6:58 AM MRN: [id number] Date of Birth: Mar 02, 1971 Attending MD: Norvel Richards , MD CSN: [id number] Age: 49 Admit Type: Outpatient Procedure:                Colonoscopy Indications:              Screening for colorectal malignant neoplasm Providers:                Norvel Richards, MD, Otis Peak B. Gwenlyn Perking RN, RN,                            Aram Candela Referring MD:              Medicines:                Midazolam 8 mg IV, Meperidine 40 mg IV, Ondansetron                            4 mg IV Complications:            No immediate complications. Estimated Blood Loss:     Estimated blood loss: none. Procedure:                Pre-Anesthesia Assessment:                           - Prior to the procedure, a History and Physical                            was performed, and patient medications and                            allergies were reviewed. The patient's tolerance of                            previous anesthesia was also reviewed. The risks                            and benefits of the procedure and the sedation                            options and risks were discussed with the patient.                            All questions were answered, and informed consent                            was obtained. Prior Anticoagulants: The patient has                            taken no previous anticoagulant or antiplatelet                            agents. ASA Grade Assessment: II - A patient with  mild systemic disease. After reviewing the risks                            and benefits, the patient was deemed in                            satisfactory condition to undergo the procedure.                           After obtaining informed consent, the colonoscope                            was passed under direct vision. Throughout the                            procedure, the  patient's blood pressure, pulse, and                            oxygen saturations were monitored continuously. The                            CF-[id number] scope was introduced through                            the anus and advanced to the the cecum, identified                            by appendiceal orifice and ileocecal valve. The                            colonoscopy was performed without difficulty. The                            patient tolerated the procedure well. The quality                            of the bowel preparation was adequate. Scope In: 7:47:09 AM Scope Out: 8:13:05 AM Scope Withdrawal Time: 0 hours 13 minutes 49 seconds  Total Procedure Duration: 0 hours 25 minutes 56 seconds  Findings:      The perianal and digital rectal examinations were normal. Redundant       colon with looping. External abdominal pressure and changing the       patient's position required to easily reach the cecum.      The colon (entire examined portion) appeared normal. Rectal vault small.       Unable to retroflex. Seen well on?"face. Impression:               - The entire examined colon is normal. Redundant.                           - No specimens collected. Moderate Sedation:      Moderate (conscious) sedation was administered by the endoscopy nurse       and supervised by the endoscopist. The following parameters were  monitored: oxygen saturation, heart rate, blood pressure, respiratory       rate, EKG, adequacy of pulmonary ventilation, and response to care.       Total physician intraservice time was 33 minutes. Recommendation:           - Patient has a contact number available for                            emergencies. The signs and symptoms of potential                            delayed complications were discussed with the                            patient. Return to normal activities tomorrow.                            Written discharge instructions were  provided to the                            patient.                           - Advance diet as tolerated.                           - Continue present medications.                           - Repeat colonoscopy in 10 years for screening                            purposes.                           - Return to GI office PRN. Procedure Code(s):        --- Professional ---                           [phone], Colonoscopy, flexible; diagnostic, including                            collection of specimen(s) by brushing or washing,                            when performed (separate procedure)                           99153, Moderate sedation; each additional 15                            minutes intraservice time                           G0500, Moderate sedation services provided by the  same physician or other qualified health care                            professional performing a gastrointestinal                            endoscopic service that sedation supports,                            requiring the presence of an independent trained                            observer to assist in the monitoring of the                            patient's level of consciousness and physiological                            status; initial 15 minutes of intra-service time;                            patient age 10 years or older (additional time may                            be reported with [phone], as appropriate) Diagnosis Code(s):        --- Professional ---                           Z12.11, Encounter for screening for malignant                            neoplasm of colon CPT copyright 2019 American Medical Association. All rights reserved. The codes documented in this report are preliminary and upon coder review may  be revised to meet current compliance requirements. Cristopher Estimable. Lillyanna Glandon, MD Norvel Richards, MD 11/17/2019 8:21:45 AM This report has been signed  electronically. Number of Addenda: 0

## 2019-11-17 NOTE — Discharge Instructions (Signed)
  Colonoscopy Discharge Instructions  Read the instructions outlined below and refer to this sheet in the next few weeks. These discharge instructions provide you with general information on caring for yourself after you leave the hospital. Your doctor may also give you specific instructions. While your treatment has been planned according to the most current medical practices available, unavoidable complications occasionally occur. If you have any problems or questions after discharge, call Dr. Gala Romney at [phone]. ACTIVITY  You may resume your regular activity, but move at a slower pace for the next 24 hours.   Take frequent rest periods for the next 24 hours.   Walking will help get rid of the air and reduce the bloated feeling in your belly (abdomen).   No driving for 24 hours (because of the medicine (anesthesia) used during the test).    Do not sign any important legal documents or operate any machinery for 24 hours (because of the anesthesia used during the test).  NUTRITION  Drink plenty of fluids.   You may resume your normal diet as instructed by your doctor.   Begin with a light meal and progress to your normal diet. Heavy or fried foods are harder to digest and may make you feel sick to your stomach (nauseated).   Avoid alcoholic beverages for 24 hours or as instructed.  MEDICATIONS  You may resume your normal medications unless your doctor tells you otherwise.  WHAT YOU CAN EXPECT TODAY  Some feelings of bloating in the abdomen.   Passage of more gas than usual.   Spotting of blood in your stool or on the toilet paper.  IF YOU HAD POLYPS REMOVED DURING THE COLONOSCOPY:  No aspirin products for 7 days or as instructed.   No alcohol for 7 days or as instructed.   Eat a soft diet for the next 24 hours.  FINDING OUT THE RESULTS OF YOUR TEST Not all test results are available during your visit. If your test results are not back during the visit, make an appointment  with your caregiver to find out the results. Do not assume everything is normal if you have not heard from your caregiver or the medical facility. It is important for you to follow up on all of your test results.  SEEK IMMEDIATE MEDICAL ATTENTION IF:  You have more than a spotting of blood in your stool.   Your belly is swollen (abdominal distention).   You are nauseated or vomiting.   You have a temperature over 101.   You have abdominal pain or discomfort that is severe or gets worse throughout the day.    Your colonoscopy was normal today  I recommend a repeat examination for screening in 10 years  At patient request, I called Alex at [phone] and reviewed results

## 2019-11-17 NOTE — H&P (Signed)
@LOGO @   Primary Care Physician:  Fayrene Helper, MD Primary Gastroenterologist:  Dr. Gala Romney  Pre-Procedure History & Physical: HPI:  [name] is a 49 y.o. female is here for a screening colonoscopy.  No GI symptoms.  No family history of colon cancer.  No prior colonoscopy.  Past Medical History:  Diagnosis Date  . Essential hypertension, benign   . Migraine headache 2007  . Obesity    Since childhood   . Pneumonia 2007  . Spinal disease   . Supraventricular tachycardia [facility])    Status post RFA July 2015 - Dr. Lovena Le  . Type 2 diabetes mellitus ([place])   . Weight loss    Hopitalized 4 years ago after loosing 200lb on a supervised program, malnurished with severe protein deficiency     Past Surgical History:  Procedure Laterality Date  . ABLATION  12-28-2013   slow pathway modification of AVNRT by Dr Lovena Le  . ELECTROPHYSIOLOGY STUDY N/A 12/28/2013   Procedure: ELECTROPHYSIOLOGY STUDY;  Surgeon: Evans Lance, MD;  Location: [facility] CATH LAB;  Service: Cardiovascular;  Laterality: N/A;  . SUPRAVENTRICULAR TACHYCARDIA ABLATION N/A 12/28/2013   Procedure: SUPRAVENTRICULAR TACHYCARDIA ABLATION;  Surgeon: Evans Lance, MD;  Location: [facility] CATH LAB;  Service: Cardiovascular;  Laterality: N/A;  . [place]    Prior to Admission medications   Medication Sig Start Date End Date Taking? Authorizing Provider  acetaminophen (TYLENOL) 500 MG tablet Take 1,000 mg by mouth every 6 (six) hours as needed (for pain).    Yes [provider]  Cholecalciferol (VITAMIN D-1000 MAX ST) 1000 units tablet Take 1,000 Units by mouth daily.    Yes [provider]  CLENPIQ 10-3.5-12 MG-GM -GM/160ML SOLN Take 320 mLs by mouth once. 08/22/19  Yes [provider]  cyclobenzaprine (FLEXERIL) 10 MG tablet Take one tablet by mouth at bedtime, as needed, for neck and back pain and spasm Patient taking differently: Take 10 mg by mouth at bedtime.  10/06/19  Yes Fayrene Helper, MD  famotidine (PEPCID) 40 MG tablet Take one tablet once daily , when you take ibuprofen Patient taking differently: Take 40 mg by mouth daily as needed (with ibuprofen).  07/30/18  Yes Fayrene Helper, MD  hydrochlorothiazide (MICROZIDE) 12.5 MG capsule TAKE 1 CAPSULE BY MOUTH ONCE DAILY Patient taking differently: Take 12.5 mg by mouth daily.  10/20/19  Yes Fayrene Helper, MD  Liniments [facility] ARTHRITIS PAIN RELIEF EX) Place 1 patch onto the skin daily as needed (pain.).   Yes [provider]  losartan (COZAAR) 50 MG tablet Take 1 tablet (50 mg total) by mouth daily. 02/28/19  Yes Fayrene Helper, MD  fluticasone [facility]) 50 MCG/ACT nasal spray Place 2 sprays into both nostrils daily as needed for allergies or rhinitis.    [provider]  ibuprofen (ADVIL) 200 MG tablet Take 800 mg by mouth every 8 (eight) hours as needed (for pain).    [provider]  ibuprofen (ADVIL) 600 MG tablet Take one tablet by mouth once daily, as needed, for uncontrolled back pain Patient not taking: Reported on 11/08/2019 03/23/19   Fayrene Helper, MD    Allergies as of 08/23/2019 - Review Complete 08/17/2019  Allergen Reaction Noted  . Codeine Nausea And Vomiting 02/10/2010  . Metoclopramide hcl Other (See Comments) 02/10/2010    Family History  Problem Relation Age of Onset  . Hypertension Mother   . Diabetes Mother   . Cancer Mother  Uterine   . Heart failure Mother        CABG  . Cancer Father 69       Prostate   . Hypertension Father   . Bradycardia Father        Irregular heart beat   . Diabetes Brother   . Diabetes Brother   . Hypertension Brother   . Hypertension Brother     Social History   Socioeconomic History  . Marital status: Married    Spouse name: Not on file  . Number of children: 2  . Years of education: Not on file  . Highest education level: Not on file  Occupational History  . Occupation: English as a second language teacher at [employer]  . Smoking status: Former Smoker    Packs/day: 0.50    Years: 7.00    Pack years: 3.50    Types: Cigarettes    Quit date: 08/05/2012    Years since quitting: 7.2  . Smokeless tobacco: Never Used  . Tobacco comment: quit in 2014  Substance and Sexual Activity  . Alcohol use: No    Alcohol/week: 0.0 standard drinks  . Drug use: No  . Sexual activity: Not on file  Other Topics Concern  . Not on file  Social History Narrative  . Not on file   Social Determinants of Health   Financial Resource Strain:   . Difficulty of Paying Living Expenses:   Food Insecurity:   . Worried About Charity fundraiser in the Last Year:   . Arboriculturist in the Last Year:   Transportation Needs:   . Film/video editor (Medical):   Marland Kitchen Lack of Transportation (Non-Medical):   Physical Activity:   . Days of Exercise per Week:   . Minutes of Exercise per Session:   Stress:   . Feeling of Stress :   Social Connections:   . Frequency of Communication with Friends and Family:   . Frequency of Social Gatherings with Friends and Family:   . Attends Religious Services:   . Active Member of Clubs or Organizations:   . Attends Archivist Meetings:   Marland Kitchen Marital Status:   Intimate Partner Violence:   . Fear of Current or Ex-Partner:   . Emotionally Abused:   Marland Kitchen Physically Abused:   . Sexually Abused:     Review of Systems: See HPI, otherwise negative ROS  Physical Exam: BP 129/68   Pulse 63   Temp 97.9 F (36.6 C) (Oral)   Resp (!) 21   Ht 5\' 11"  (1.803 m)   LMP 10/23/2019   SpO2 100%   BMI 26.78 kg/m  General:   Alert,  Well-developed, well-nourished, pleasant and cooperative in NAD Lungs:  Clear throughout to auscultation.   No wheezes, crackles, or rhonchi. No acute distress. Heart:  Regular rate and rhythm; no murmurs, clicks, rubs,  or gallops. Abdomen:  Soft, nontender and nondistended. No masses, hepatosplenomegaly or hernias noted. Normal bowel sounds,  without guarding, and without rebound.    Impression/Plan: [name] is now here to undergo a screening colonoscopy.  First ever average rescreening colonoscopy  Risks, benefits, limitations, imponderables and alternatives regarding colonoscopy have been reviewed with the patient. Questions have been answered. All parties agreeable.     Notice:  This dictation was prepared with Dragon dictation along with smaller phrase technology. Any transcriptional errors that result from this process are unintentional and may not be corrected upon review.

## 2019-12-06 ENCOUNTER — Encounter: Payer: Self-pay | Admitting: Family Medicine

## 2019-12-06 ENCOUNTER — Ambulatory Visit (INDEPENDENT_AMBULATORY_CARE_PROVIDER_SITE_OTHER): Payer: No Typology Code available for payment source | Admitting: Family Medicine

## 2019-12-06 ENCOUNTER — Other Ambulatory Visit: Payer: Self-pay

## 2019-12-06 ENCOUNTER — Other Ambulatory Visit: Payer: Self-pay | Admitting: Family Medicine

## 2019-12-06 VITALS — BP 106/74 | HR 62 | Temp 98.4°F | Resp 15 | Ht 71.0 in | Wt 195.0 lb

## 2019-12-06 DIAGNOSIS — M549 Dorsalgia, unspecified: Secondary | ICD-10-CM | POA: Diagnosis not present

## 2019-12-06 DIAGNOSIS — N912 Amenorrhea, unspecified: Secondary | ICD-10-CM

## 2019-12-06 DIAGNOSIS — Z23 Encounter for immunization: Secondary | ICD-10-CM | POA: Diagnosis not present

## 2019-12-06 DIAGNOSIS — I10 Essential (primary) hypertension: Secondary | ICD-10-CM

## 2019-12-06 DIAGNOSIS — Z1159 Encounter for screening for other viral diseases: Secondary | ICD-10-CM

## 2019-12-06 DIAGNOSIS — E663 Overweight: Secondary | ICD-10-CM | POA: Diagnosis not present

## 2019-12-06 DIAGNOSIS — M5430 Sciatica, unspecified side: Secondary | ICD-10-CM

## 2019-12-06 DIAGNOSIS — E559 Vitamin D deficiency, unspecified: Secondary | ICD-10-CM

## 2019-12-06 LAB — POCT URINE PREGNANCY: Preg Test, Ur: NEGATIVE

## 2019-12-06 MED ORDER — LOSARTAN POTASSIUM-HCTZ 50-12.5 MG PO TABS: 1.0000 | ORAL_TABLET | Freq: Every day | ORAL | 3 refills | Status: DC

## 2019-12-06 NOTE — Assessment & Plan Note (Addendum)
Worsening with reduced quality of life, needs imaging and  neurosrg eval, intolerant of steroids, trial of bedtime gabapentin, also lumbar spine X ray today

## 2019-12-06 NOTE — Assessment & Plan Note (Signed)
  Patient re-educated about  the importance of commitment to a  minimum of 150 minutes of exercise per week as able.  The importance of healthy food choices with portion control discussed, as well as eating regularly and within a 12 hour window most days. The need to choose "clean , green" food 50 to 75% of the time is discussed, as well as to make water the primary drink and set a goal of 64 ounces water daily.    Weight /BMI 12/06/2019 11/17/2019 03/23/2019  WEIGHT 195 lb 0.6 oz - 192 lb  HEIGHT 5\' 11"  5\' 11"  5\' 11"   BMI 27.2 kg/m2 26.78 kg/m2 26.78 kg/m2  Some encounter information is confidential and restricted. Go to Review Flowsheets activity to see all data.

## 2019-12-06 NOTE — Progress Notes (Signed)
[name]     MRN: [id number]      DOB: Sep 26, 1970   HPI [name] is here for follow up and re-evaluation of chronic medical conditions, medication management and review of any available recent lab and radiology data.  Preventive health is updated, specifically  Cancer screening and Immunization.    The PT denies any adverse reactions to current medications since the last visit.  C/o increasingly debilitating back pain, radiates to left knee through left thigh, prevents her even from doing housework, rated at a 5 at rest and goed up to an 8 with minimal activity. Denies lower extremity weakness or numbness  Currently unable to exercise and even walk due to back pain C/o feeling hot, then cold thi morning, no symptoms of infection, has ahd no cycle in 6 weeks and is normally reular like clockwork to present. Has had BTL in the remote past  ROS Denies recent fever or chills. Denies sinus pressure, nasal congestion, ear pain or sore throat. Denies chest congestion, productive cough or wheezing. Denies chest pains, palpitations and leg swelling Denies abdominal pain, nausea, vomiting,diarrhea or constipation.   Denies dysuria, frequency, hesitancy or incontinence.  Denies headaches, seizures, numbness, or tingling. Denies depression, anxiety or insomnia. Denies skin break down or rash.   PE  BP 106/74   Pulse 62   Temp 98.4 F (36.9 C) (Temporal)   Resp 15   Ht 5\' 11"  (1.803 m)   Wt 195 lb 0.6 oz (88.5 kg)   SpO2 100%   BMI 27.20 kg/m   Patient alert and oriented and in no cardiopulmonary distress.  HEENT: No facial asymmetry, EOMI,     Neck supple .  Chest: Clear to auscultation bilaterally.  CVS: S1, S2 no murmurs, no S3.Regular rate.  ABD: Soft non tender.   Ext: No edema  MS: Decreased ROM lumbar  spine, shoulders, hips and knees.adequate in shoulders, hips and knees  Skin: Intact, no ulcerations or rash noted.  Psych: Good eye contact, normal  affect. Memory intact not anxious or depressed appearing.  CNS: CN 2-12 intact, power,  normal throughout.no focal deficits noted.   Assessment & Plan  Essential hypertension, benign Controlled, no change in medication DASH diet and commitment to daily physical activity for a minimum of 30 minutes discussed and encouraged, as a part of hypertension management. The importance of attaining a healthy weight is also discussed.  BP/Weight 12/06/2019 11/17/2019 03/23/2019 02/09/2019 11/04/2018 09/01/2018 12/17/3660  Systolic BP 947 654 650 354 656 812 751  Diastolic BP 74 79 82 86 76 70 70  Wt. (Lbs) 195.04 - 192 192.4 196.8 201.08 205  BMI 27.2 26.78 26.78 26.83 27.45 28.04 28.59  Some encounter information is confidential and restricted. Go to Review Flowsheets activity to see all data.       Back pain with sciatica Worsening with reduced quality of life, needs imaging and  neurosrg eval, intolerant of steroids, trial of bedtime gabapentin, also lumbar spine X ray today  Overweight (BMI 25.0-29.9)  Patient re-educated about  the importance of commitment to a  minimum of 150 minutes of exercise per week as able.  The importance of healthy food choices with portion control discussed, as well as eating regularly and within a 12 hour window most days. The need to choose "clean , green" food 50 to 75% of the time is discussed, as well as to make water the primary drink and set a goal of 64 ounces water daily.  Weight /BMI 12/06/2019 11/17/2019 03/23/2019  WEIGHT 195 lb 0.6 oz - 192 lb  HEIGHT 5\' 11"  5\' 11"  5\' 11"   BMI 27.2 kg/m2 26.78 kg/m2 26.78 kg/m2  Some encounter information is confidential and restricted. Go to Review Flowsheets activity to see all data.      Need for Tdap vaccination After obtaining informed consent, the vaccine is  administered , with no adverse effect noted at the time of administration.

## 2019-12-06 NOTE — Assessment & Plan Note (Signed)
After obtaining informed consent, the vaccine is  administered , with no adverse effect noted at the time of administration.  

## 2019-12-06 NOTE — Patient Instructions (Addendum)
Annual exam with pap in mid to end Sept, call if you need me before  Urine pregnancy test in office today  TdAP today  CBC,TSH,  vit D, chem7 and EGFr and hepatitis C screen today at [place] is hyzaar 1 daily for blood pressure, do not take cozaar and with this as both together equal hyzaar  Xr ay of low back due to pain radiation to left thigh, trial of gabapentin at bedtime for pain also, if not effective we will  order MRI and refer on to pain management   Thanks for choosing [facility], we consider it a privelige to serve you.

## 2019-12-06 NOTE — Assessment & Plan Note (Signed)
Controlled, no change in medication DASH diet and commitment to daily physical activity for a minimum of 30 minutes discussed and encouraged, as a part of hypertension management. The importance of attaining a healthy weight is also discussed.  BP/Weight 12/06/2019 11/17/2019 03/23/2019 02/09/2019 11/04/2018 09/01/2018 10/13/9530  Systolic BP 023 343 568 616 837 290 211  Diastolic BP 74 79 82 86 76 70 70  Wt. (Lbs) 195.04 - 192 192.4 196.8 201.08 205  BMI 27.2 26.78 26.78 26.83 27.45 28.04 28.59  Some encounter information is confidential and restricted. Go to Review Flowsheets activity to see all data.

## 2019-12-08 ENCOUNTER — Ambulatory Visit (HOSPITAL_COMMUNITY)
Admission: RE | Admit: 2019-12-08 | Discharge: 2019-12-08 | Disposition: A | Discharge status: Home / Self Care | Payer: No Typology Code available for payment source | Source: Ambulatory Visit | Attending: Family Medicine | Admitting: Family Medicine

## 2019-12-08 ENCOUNTER — Other Ambulatory Visit: Payer: Self-pay

## 2019-12-08 DIAGNOSIS — M549 Dorsalgia, unspecified: Secondary | ICD-10-CM | POA: Diagnosis not present

## 2019-12-08 DIAGNOSIS — M5430 Sciatica, unspecified side: Secondary | ICD-10-CM | POA: Insufficient documentation

## 2019-12-08 LAB — CBC
Hematocrit: 37.6 % (ref 34.0–46.6)
Hemoglobin: 12 g/dL (ref 11.1–15.9)
MCH: 26.5 pg — ABNORMAL LOW (ref 26.6–33.0)
MCHC: 31.9 g/dL (ref 31.5–35.7)
MCV: 83 fL (ref 79–97)
Platelets: 250 10*3/uL (ref 150–450)
RBC: 4.53 x10E6/uL (ref 3.77–5.28)
RDW: 14.6 % (ref 11.7–15.4)
WBC: 9.1 10*3/uL (ref 3.4–10.8)

## 2019-12-08 LAB — BMP8+EGFR
BUN/Creatinine Ratio: 16 (ref 9–23)
BUN: 13 mg/dL (ref 6–24)
CO2: 25 mmol/L (ref 20–29)
Calcium: 9.4 mg/dL (ref 8.7–10.2)
Chloride: 98 mmol/L (ref 96–106)
Creatinine, Ser: 0.8 mg/dL (ref 0.57–1.00)
GFR calc Af Amer: 100 mL/min/{1.73_m2} (ref >59)
GFR calc non Af Amer: 87 mL/min/{1.73_m2} (ref >59)
Glucose: 89 mg/dL (ref 65–99)
Potassium: 3.5 mmol/L (ref 3.5–5.2)
Sodium: 139 mmol/L (ref 134–144)

## 2019-12-08 LAB — TSH: TSH: 1.86 u[IU]/mL (ref 0.450–4.500)

## 2019-12-08 LAB — VITAMIN D 25 HYDROXY (VIT D DEFICIENCY, FRACTURES): Vit D, 25-Hydroxy: 30.4 ng/mL (ref 30.0–100.0)

## 2019-12-08 LAB — HEPATITIS C ANTIBODY: Hep C Virus Ab: <0.1 s/co ratio (ref 0.0–0.9)

## 2019-12-11 ENCOUNTER — Encounter: Payer: Self-pay | Admitting: Family Medicine

## 2019-12-11 ENCOUNTER — Other Ambulatory Visit: Payer: Self-pay | Admitting: Family Medicine

## 2019-12-11 ENCOUNTER — Telehealth: Payer: Self-pay | Admitting: Family Medicine

## 2019-12-11 MED ORDER — GABAPENTIN 100 MG PO CAPS: ORAL_CAPSULE | ORAL | 3 refills | Status: DC

## 2019-12-11 MED FILL — GABAPENTIN 100 MG CAPSULE: 100 | 30 days supply | Qty: 60 | Fill #0

## 2019-12-11 NOTE — Telephone Encounter (Signed)
pls directly contact pt, get name of MD or detail of the Ortho practice she wants, left a street address in pt message sched and enter referral for back pain please , I will sign  /? pls ask thanks

## 2019-12-11 NOTE — Progress Notes (Signed)
GABAPENTIN 100

## 2019-12-13 ENCOUNTER — Encounter: Payer: Self-pay | Admitting: Family Medicine

## 2019-12-14 ENCOUNTER — Encounter: Payer: Self-pay | Admitting: Specialist

## 2019-12-14 ENCOUNTER — Other Ambulatory Visit: Payer: Self-pay

## 2019-12-14 ENCOUNTER — Ambulatory Visit: Payer: Self-pay

## 2019-12-14 ENCOUNTER — Ambulatory Visit: Payer: No Typology Code available for payment source | Admitting: Specialist

## 2019-12-14 VITALS — BP 114/71 | HR 73 | Ht 71.0 in | Wt 195.0 lb

## 2019-12-14 DIAGNOSIS — M533 Sacrococcygeal disorders, not elsewhere classified: Secondary | ICD-10-CM | POA: Diagnosis not present

## 2019-12-14 DIAGNOSIS — M4726 Other spondylosis with radiculopathy, lumbar region: Secondary | ICD-10-CM | POA: Diagnosis not present

## 2019-12-14 DIAGNOSIS — M549 Dorsalgia, unspecified: Secondary | ICD-10-CM | POA: Diagnosis not present

## 2019-12-14 DIAGNOSIS — M5430 Sciatica, unspecified side: Secondary | ICD-10-CM | POA: Diagnosis not present

## 2019-12-14 DIAGNOSIS — M5136 Other intervertebral disc degeneration, lumbar region: Secondary | ICD-10-CM

## 2019-12-14 DIAGNOSIS — M51369 Other intervertebral disc degeneration, lumbar region without mention of lumbar back pain or lower extremity pain: Secondary | ICD-10-CM

## 2019-12-14 MED ORDER — DICLOFENAC SODIUM 50 MG PO TBEC: DELAYED_RELEASE_TABLET | ORAL | 3 refills | Status: AC

## 2019-12-14 MED FILL — DICLOFENAC SOD EC 50 MG TAB: 50 | 37 days supply | Qty: 90 | Fill #0

## 2019-12-14 NOTE — Patient Instructions (Addendum)
Avoid frequent bending and stooping  No lifting greater than 10 lbs. May use ice or moist heat for pain. Weight loss is of benefit. Best medication for lumbar disc disease is arthritis medications like diclofenac, motrin, celebrex and naprosyn. Exercise is important to improve your indurance and does allow people to function better inspite of back pain.  Diclofenac for arthritis pain in the back and SI joints. Physical therapy for the spine and SI joints  Hemp CBD capsules, amazon.com 5,000-7,000 mg per bottle, 60 capsules per bottle, take one capsule twice a day.  Follow-Up Instructions: No follow-ups on file.

## 2019-12-14 NOTE — Progress Notes (Signed)
Office Visit Note   Patient: [name]           Date of Birth: 1970-08-27           MRN: [id number] Visit Date: 12/14/2019              Requested by: Fayrene Helper, MD [address], [place],  [place] [address] PCP: Fayrene Helper, MD   Assessment & Plan: Visit Diagnoses:  1. Back pain with sciatica   2. Degenerative disc disease, lumbar   3. Sacroiliac joint disease   4. Other spondylosis with radiculopathy, lumbar region     Plan: Avoid frequent bending and stooping  No lifting greater than 10 lbs. May use ice or moist heat for pain. Weight loss is of benefit. Best medication for lumbar disc disease is arthritis medications like diclofenac, motrin, celebrex and naprosyn. Exercise is important to improve your indurance and does allow people to function better inspite of back pain.  Diclofenac for arthritis pain in the back and SI joints. Physical therapy for the spine and SI joints  Hemp CBD capsules, amazon.com 5,000-7,000 mg per bottle, 60 capsules per bottle, take one capsule twice a day.  Follow-Up Instructions: No follow-ups on file.   Follow-Up Instructions: Return in about 4 weeks (around 01/11/2020).   Orders:  Orders Placed This Encounter  Procedures  . XR Lumb Spine Flex&Ext Only  . Ambulatory referral to Physical Therapy   Meds ordered this encounter  Medications  . diclofenac (VOLTAREN) 50 MG EC tablet    Sig: Take 1 tablet (50 mg total) by mouth daily after breakfast for 7 days, THEN 1 tablet (50 mg total) 2 (two) times daily for 7 days, THEN 1 tablet (50 mg total) 3 (three) times daily for 14 days.    Dispense:  90 tablet    Refill:  3      Procedures: No procedures performed   Clinical Data: No additional findings.   Subjective: Chief Complaint  Patient presents with  . Lower Back - Follow-up    49 year old female with a long history of low back pain going back even to HS and she had pain intermittantly. In  2015 she had an episode of pain that was severe and she was seen in the ER and she had xray and last MRI was done in 2006-7. Right now she has been having pain  That is a 4-5 and is tolerable but there have been days with no pain in the past, now there are fewer days that she has no pain. Walking seems to make the pain worsen and pain is greater with gardening, mopping or sweeping, raking too. Riding in a car for long distance she gets stiffness that last several days. She can not lift too heavy of weight because she will feel it in her back, She takes flexeril and this helps with sleep, she can still feel it but it helps. 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 She has not walked a mile in a couple of months  08/2018. She paid for it later. No bowel or bladder difficulty. Does get mainly left leg numbness in the left foot in the big toe. Down the side of the calf and thigh   Review of Systems  Constitutional: Positive for chills. Negative for activity change, appetite change, diaphoresis, fatigue, fever and  unexpected weight change.  HENT: Positive for congestion, rhinorrhea, sinus pressure and sinus pain. Negative for dental problem, drooling, ear discharge, ear pain, facial swelling, hearing loss, mouth sores, nosebleeds, postnasal drip, sneezing, sore throat, tinnitus, trouble swallowing and voice change.   Eyes: Negative.  Negative for photophobia, pain, discharge, redness, itching and visual disturbance.  Respiratory: Negative.  Negative for apnea, cough, choking, chest tightness, shortness of breath, wheezing and stridor.   Cardiovascular: Negative.  Negative for chest pain, palpitations and leg swelling.  Gastrointestinal: Negative.  Negative for abdominal distention, abdominal pain, anal bleeding, blood in stool, constipation, diarrhea, nausea, rectal pain and vomiting.  Endocrine: Negative for  cold intolerance, heat intolerance, polydipsia, polyphagia and polyuria.  Genitourinary: Negative.  Negative for difficulty urinating, dyspareunia, dysuria, enuresis, flank pain, frequency and urgency.  Musculoskeletal: Positive for back pain. Negative for arthralgias, gait problem, joint swelling, myalgias, neck pain and neck stiffness.  Skin: Negative.  Negative for color change, pallor, rash and wound.  Allergic/Immunologic: Negative.  Negative for environmental allergies, food allergies and immunocompromised state.  Neurological: Positive for weakness and numbness. Negative for dizziness, tremors, seizures, syncope, facial asymmetry, speech difficulty, light-headedness and headaches.  Hematological: Negative for adenopathy. Does not bruise/bleed easily.  Psychiatric/Behavioral: Negative.  Negative for agitation, behavioral problems, confusion, decreased concentration, dysphoric mood, hallucinations, self-injury, sleep disturbance and suicidal ideas. The patient is not nervous/anxious and is not hyperactive.      Objective: Vital Signs: BP 114/71 (BP Location: Left Arm, Patient Position: Sitting)   Pulse 73   Ht 5\' 11"  (1.803 m)   Wt 195 lb (88.5 kg)   BMI 27.20 kg/m   Physical Exam Musculoskeletal:     Lumbar back: Negative right straight leg raise test and negative left straight leg raise test.     Back Exam   Tenderness  The patient is experiencing tenderness in the lumbar.  Range of Motion  Extension:  40 abnormal  Flexion:  70 abnormal  Lateral bend right: abnormal  Lateral bend left: abnormal  Rotation right: abnormal  Rotation left: abnormal   Muscle Strength  Right Quadriceps:  5/5  Left Quadriceps:  5/5  Right Hamstrings:  5/5  Left Hamstrings:  5/5   Tests  Straight leg raise right: negative Straight leg raise left: negative  Reflexes  Patellar: 0/4 Achilles: 1/4  Other  Toe walk: normal Heel walk: normal Sensation: normal Erythema: no back  redness Scars: absent  Comments:  Left foot DF 5-/5 left side. PCT negative, Calf and thigh circumferences are equal.      Specialty Comments:  No specialty comments available.  Imaging: XR Lumb Spine Flex&Ext Only  Result Date: 12/14/2019 [facility] and flexion and extension radiographs show Disc narrowing at L3-4 and L4-5 that is moderately severe with enplate sclerosis and some suggestion of a schmorl's node at the L3-4 level. There is vacuum sign at L4-5  And anterior spondylotic osteophytes. The inferior endplate of L5 show sclerosis but the disc height is less narrowed with minimal osteophtyes at this level. Bilateral SI joint sclerosis with subchondral cyst changes noted. Significant DDD with SI arthritis and left lumbar scolios  L2 to L5 10 degrees.     PMFS History: Patient Active Problem List   Diagnosis Date Noted  . Need for Tdap vaccination 12/06/2019  . Symptomatic varicose veins, bilateral 09/01/2018  . Dyspepsia 01/18/2017  . Back pain with sciatica 09/20/2014  . Solitary lung nodule 09/05/2014  . Hyperlipidemia LDL goal <100 01/26/2014  . Overweight (BMI  25.0-29.9) 01/26/2014  . Allergic rhinitis 09/08/2010  . OTH ABNORMAL BRAIN & CNS FUNCTION STUDY 02/16/2010  . GOITER, UNSPECIFIED 02/10/2010  . Essential hypertension, benign 05/02/2009   Past Medical History:  Diagnosis Date  . Essential hypertension, benign   . Migraine headache 2007  . Obesity    Since childhood   . Pneumonia 2007  . Spinal disease   . Supraventricular tachycardia [facility])    Status post RFA July 2015 - Dr. Lovena Le  . Type 2 diabetes mellitus ([place])   . Weight loss    Hopitalized 4 years ago after loosing 200lb on a supervised program, malnurished with severe protein deficiency     Family History  Problem Relation Age of Onset  . Hypertension Mother   . Diabetes Mother   . Cancer Mother        Uterine   . Heart failure Mother        CABG  . Cancer Father 48       Prostate   .  Hypertension Father   . Bradycardia Father        Irregular heart beat   . Diabetes Brother   . Diabetes Brother   . Hypertension Brother   . Hypertension Brother     Past Surgical History:  Procedure Laterality Date  . ABLATION  12-28-2013   slow pathway modification of AVNRT by Dr Lovena Le  . COLONOSCOPY N/A 11/17/2019   Procedure: COLONOSCOPY;  Surgeon: Daneil Dolin, MD;  Location: [facility] SUITE;  Service: Endoscopy;  Laterality: N/A;  7:30  . ELECTROPHYSIOLOGY STUDY N/A 12/28/2013   Procedure: ELECTROPHYSIOLOGY STUDY;  Surgeon: Evans Lance, MD;  Location: [facility] CATH LAB;  Service: Cardiovascular;  Laterality: N/A;  . SUPRAVENTRICULAR TACHYCARDIA ABLATION N/A 12/28/2013   Procedure: SUPRAVENTRICULAR TACHYCARDIA ABLATION;  Surgeon: Evans Lance, MD;  Location: [facility] CATH LAB;  Service: Cardiovascular;  Laterality: N/A;  . TUBAL LIGATION  1996   Social History   Occupational History  . Occupation: English as a second language teacher at [employer]  . Smoking status: Former Smoker    Packs/day: 0.50    Years: 7.00    Pack years: 3.50    Types: Cigarettes    Quit date: 08/05/2012    Years since quitting: 7.3  . Smokeless tobacco: Never Used  . Tobacco comment: quit in 2014  Vaping Use  . Vaping Use: Never used  Substance and Sexual Activity  . Alcohol use: No    Alcohol/week: 0.0 standard drinks  . Drug use: No  . Sexual activity: Not on file

## 2019-12-17 ENCOUNTER — Encounter: Payer: Self-pay | Admitting: Family Medicine

## 2019-12-18 ENCOUNTER — Other Ambulatory Visit: Payer: Self-pay | Admitting: *Deleted

## 2019-12-18 MED ORDER — FAMOTIDINE 40 MG PO TABS: ORAL_TABLET | ORAL | 0 refills | Status: DC

## 2019-12-18 MED FILL — FAMOTIDINE 40 MG TABS: 40 | 30 days supply | Qty: 30 | Fill #0

## 2020-01-01 MED FILL — CYCLOBENZAPRINE HCL 10 MG T: 10 | 90 days supply | Qty: 90 | Fill #0

## 2020-01-09 MED FILL — LOSARTAN-HCTZ 50-12.5 MG TA: 50-12.5 | 90 days supply | Qty: 90 | Fill #0

## 2020-01-10 ENCOUNTER — Ambulatory Visit (HOSPITAL_COMMUNITY): Payer: No Typology Code available for payment source | Attending: Specialist | Admitting: Physical Therapy

## 2020-01-10 ENCOUNTER — Ambulatory Visit: Payer: No Typology Code available for payment source | Admitting: Specialist

## 2020-01-10 ENCOUNTER — Other Ambulatory Visit: Payer: Self-pay

## 2020-01-10 DIAGNOSIS — G8929 Other chronic pain: Secondary | ICD-10-CM | POA: Diagnosis present

## 2020-01-10 DIAGNOSIS — M545 Low back pain, unspecified: Secondary | ICD-10-CM

## 2020-01-10 DIAGNOSIS — M6281 Muscle weakness (generalized): Secondary | ICD-10-CM | POA: Diagnosis present

## 2020-01-10 NOTE — Patient Instructions (Signed)
Upper TRA activation and anterior lower rib decompression - laying on your back with your hands on your stomach right below your ribs - breath out (forcefully) and try to tighten the area between your lower ribs/ try to bring your lower ribs together/ downward- maintain this tightness when you breath in and then breath out again trying to make this area smaller still.

## 2020-01-10 NOTE — Therapy (Signed)
[place], [place], [address] Phone: [phone]   Fax:  [phone]  Physical Therapy Evaluation  Patient Details  Name: [name] MRN: [id number] Date of Birth: December 20, 1970 Referring Provider (PT): Basil Dess, MD   Encounter Date: 01/10/2020   PT End of Session - 01/10/20 1520    Visit Number 1    Number of Visits 8    Date for PT Re-Evaluation 02/21/20    Authorization Type [place] focus, $40 copay    Progress Note Due on Visit 10    PT Start Time 1520    PT Stop Time 1600    PT Time Calculation (min) 40 min    Activity Tolerance Patient tolerated treatment well    Behavior During Therapy [facility] for tasks assessed/performed           Past Medical History:  Diagnosis Date  . Essential hypertension, benign   . Migraine headache 2007  . Obesity    Since childhood   . Pneumonia 2007  . Spinal disease   . Supraventricular tachycardia [facility])    Status post RFA July 2015 - Dr. Lovena Le  . Type 2 diabetes mellitus ([place])   . Weight loss    Hopitalized 4 years ago after loosing 200lb on a supervised program, malnurished with severe protein deficiency     Past Surgical History:  Procedure Laterality Date  . ABLATION  12-28-2013   slow pathway modification of AVNRT by Dr Lovena Le  . COLONOSCOPY N/A 11/17/2019   Procedure: COLONOSCOPY;  Surgeon: Daneil Dolin, MD;  [facility];  Service: Endoscopy;  Laterality: N/A;  7:30  . ELECTROPHYSIOLOGY STUDY N/A 12/28/2013   Procedure: ELECTROPHYSIOLOGY STUDY;  Surgeon: Evans Lance, MD;  Location: [facility] CATH LAB;  Service: Cardiovascular;  Laterality: N/A;  . SUPRAVENTRICULAR TACHYCARDIA ABLATION N/A 12/28/2013   Procedure: SUPRAVENTRICULAR TACHYCARDIA ABLATION;  Surgeon: Evans Lance, MD;  Location: [facility] CATH LAB;  Service: Cardiovascular;  Laterality: N/A;  . TUBAL LIGATION  1996    There were no vitals filed for this visit.    Subjective  Assessment - 01/10/20 1604    Subjective Patient reports that she has been having back pain for years (at least 6 years ago) and it has been getting worse and worse and now she is having pain everyday. States bending and standing too long starts hurting. States she works at [employer] center. States pain is in the lower back and it radiates around her pelvis in the bones on both sides but also down the back and lateral side of her left leg. Medication helps and rest helps but sitting too long also helps her. Reports when she was walking a lot and she used to feel good and didn't have constant pain. States she has not tried going back to walking because her pain is so bad. Current pain is 4/10, yesterday it was 6/10. States she is on medication now and that helps. States she hasn't been exercise because she is fearful of hurting herself.  States she has taken about 3-4 months off since most recent flare up. States when she lays on her stomach her back will catch when she gets out of position and will start to hurt if she stays in that position for too long.    Pertinent History DB, chronic low back pain    Limitations Sitting;Reading;Lifting;Standing;Walking;House hold activities    Patient Stated Goals Wants to know what she can do  without hurting back.    Currently in Pain? Yes    Pain Score 4     Pain Location Back    Pain Orientation Mid;Left    Pain Descriptors / Indicators Aching    Pain Type Chronic pain    Pain Onset More than a month ago    Pain Frequency Constant    Aggravating Factors  sitting, standing,bending    Pain Relieving Factors medication              [facility] PT Assessment - 01/10/20 0001      Assessment   Medical Diagnosis LBP    Referring Provider (PT) Basil Dess, MD    Prior Therapy yes over 20 years ago       Precautions   Precautions None      Balance Screen   Has the patient fallen in the past 6 months Yes    How many times? 1    Has the patient had a decrease in  activity level because of a fear of falling?  Yes    Is the patient reluctant to leave their home because of a fear of falling?  No      Home Social worker Private residence    Living Arrangements Spouse/significant other;Children    Available Help at Discharge Family    Type of [place] to enter    Entrance Stairs-Number of Steps 4    [place] Two level    Alternate Level Stairs-Number of Steps 8    Additional Comments uses rail when stiff      Prior Function   Level of Independence Independent    Vocation Full time employment    Vocation Requirements full time RN at [employer]   Overall Cognitive Status Within Functional Limits for tasks assessed      Observation/Other Assessments   Focus on Therapeutic Outcomes (FOTO)  37.55% function       ROM / Strength   AROM / PROM / Strength AROM;Strength      AROM   AROM Assessment Site Lumbar;Hip    Right/Left Hip Right;Left    Right Hip Extension 15    Right Hip External Rotation  45   just stretching in hip   Right Hip Internal Rotation  30   just stretching in hip   Left Hip Extension 15    Left Hip External Rotation  45   pain in lower back   Left Hip Internal Rotation  30   pain in hip    Lumbar Flexion 100% limited   painful in low back    Lumbar Extension 100% limited   no change in symptoms   Lumbar - Right Side Bend 50% limited    no change in symptoms    Lumbar - Left Side Bend 75% limited   tightness on right      Strength   Strength Assessment Site Hip    Right/Left Hip Right;Left    Right Hip Extension 4/5   no change in symptoms   Left Hip Extension 4-/5   pain in low back      Palpation   Spinal mobility hypomobility noted at L4/5.     SI assessment  left PSIS more posterior than R     Palpation comment no tenderness to palpation noted      Special Tests    Special Tests Leg LengthTest;Sacrolliac Tests;Hip Special  Tests    Sacroiliac Tests  Gaenslen's  Test    Leg length test  Apparent    Hip Special Tests  Ely's Test      Gaenslen's test   Comments gaenslen's sign - pain with right leg down and feels good with left leg down       Apparent   Comments equal       Ely's Test   Comments ely's test - B neg but left thigh more tight per patient      Bed Mobility   Bed Mobility --   pain with rolling over                     Objective measurements completed on examination: See above findings.       [facility] Adult PT Treatment/Exercise - 01/10/20 0001      Exercises   Exercises Lumbar      Lumbar Exercises: Supine   Ab Set --   see instructions - 6 minutes                 PT Education - 01/10/20 1612    Education Details on current condition, POC, HEP and current findings.    Person(s) Educated Patient    Methods Explanation    Comprehension Verbalized understanding            PT Short Term Goals - 01/10/20 1605      PT SHORT TERM GOAL #1   Title Patient will report at least 25% improvement in overall symptoms and/or function to demonstrate improved functional mobility    Time 3    Period Weeks    Status New    Target Date 01/31/20      PT SHORT TERM GOAL #2   Title Patient will be independent in self management strategies to improve quality of life and functional outcomes.    Time 3    Period Weeks    Status New    Target Date 01/31/20      PT SHORT TERM GOAL #3   Title Patient will be able to lay prone for 10 minutes at a time without pain to improve ability to lay in this position.    Time 3    Period Weeks    Status New    Target Date 01/31/20             PT Long Term Goals - 01/10/20 1606      PT LONG TERM GOAL #1   Title Patient will be able to perform walking program she was previously doing without difficulties.    Time 6    Period Weeks    Status New    Target Date 02/21/20      PT LONG TERM GOAL #2   Title Patient will improve on FOTO score to meet predicted  outcomes to demonstrate improved functional mobility.    Time 6    Period Weeks    Status New    Target Date 02/21/20      PT LONG TERM GOAL #3   Title Patient will report at least 50% improvement in overall symptoms and/or function to demonstrate improved functional mobility    Time 6    Period Weeks    Status New    Target Date 02/21/20                  Plan - 01/10/20 1608    Clinical Impression Statement Patient presents with  chronic low back pain with symptoms presenting with left hip ROM and strength testing. Pain with prone positioning and with bed mobilities. Educated patient in proper core activation with functional movements and how this can cause catching in back with movement. Unable to rule out sacroiliac joint at this time and will continue to assess as needed. Limited lumbar mobility observed both actively and passively greatly limiting overall functional mobility. Patient would benefit from skilled physical therapy to improve overall functional mobility and quality of life.    Examination-Activity Limitations Sleep;Squat;Stairs;Stand;Transfers    Examination-Participation Restrictions Meal Prep;Driving;Community Activity;Cleaning    Stability/Clinical Decision Making Stable/Uncomplicated    Clinical Decision Making Low    Rehab Potential Good    PT Frequency Other (comment)   8 visits over 6 week certification   PT Duration 6 weeks    PT Treatment/Interventions ADLs/Self Care Home Management;Aquatic Therapy;Biofeedback;Cryotherapy;Electrical Stimulation;Iontophoresis 4mg /ml Dexamethasone;Moist Heat;Traction;Balance training;Therapeutic exercise;Therapeutic activities;Functional mobility training;Stair training;Gait training;DME Instruction;Neuromuscular re-education;Patient/family education;Manual techniques;Dry needling;Passive range of motion;Joint Manipulations    PT Next Visit Plan TRA activation and breathing exercises, further assessment of SIJ, lumabr mobility  exercises    PT Home Exercise Plan 7/21 upper TRA activation    Consulted and Agree with Plan of Care Patient           Patient will benefit from skilled therapeutic intervention in order to improve the following deficits and impairments:  Decreased activity tolerance, Decreased balance, Pain, Decreased strength, Decreased range of motion, Decreased mobility, Decreased endurance  Visit Diagnosis: Chronic midline low back pain without sciatica  Muscle weakness (generalized)     Problem List Patient Active Problem List   Diagnosis Date Noted  . Need for Tdap vaccination 12/06/2019  . Symptomatic varicose veins, bilateral 09/01/2018  . Dyspepsia 01/18/2017  . Back pain with sciatica 09/20/2014  . Solitary lung nodule 09/05/2014  . Hyperlipidemia LDL goal <100 01/26/2014  . Overweight (BMI 25.0-29.9) 01/26/2014  . Allergic rhinitis 09/08/2010  . OTH ABNORMAL BRAIN & CNS FUNCTION STUDY 02/16/2010  . GOITER, UNSPECIFIED 02/10/2010  . Essential hypertension, benign 05/02/2009    Eliezer Champagne 01/10/2020, 4:16 PM  [place] [address] [place], [place], [address] Phone: [phone]   Fax:  [phone]  Name: [name] MRN: [id number] Date of Birth: 08/23/1970

## 2020-01-11 ENCOUNTER — Ambulatory Visit (INDEPENDENT_AMBULATORY_CARE_PROVIDER_SITE_OTHER): Payer: No Typology Code available for payment source | Admitting: Surgery

## 2020-01-11 ENCOUNTER — Encounter: Payer: Self-pay | Admitting: Surgery

## 2020-01-11 VITALS — BP 124/84 | HR 73 | Ht 71.0 in | Wt 195.0 lb

## 2020-01-11 DIAGNOSIS — M5136 Other intervertebral disc degeneration, lumbar region: Secondary | ICD-10-CM | POA: Diagnosis not present

## 2020-01-11 DIAGNOSIS — M5430 Sciatica, unspecified side: Secondary | ICD-10-CM | POA: Diagnosis not present

## 2020-01-11 DIAGNOSIS — M549 Dorsalgia, unspecified: Secondary | ICD-10-CM | POA: Diagnosis not present

## 2020-01-11 NOTE — Progress Notes (Signed)
Office Visit Note   Patient: [name]           Date of Birth: 04-30-1971           MRN: [id number] Visit Date: 01/11/2020              Requested by: Fayrene Helper, MD [address] [place],  [place] [address] PCP: Fayrene Helper, MD   Assessment & Plan: Visit Diagnoses:  1. Back pain with sciatica   2. Degenerative disc disease, lumbar     Plan: Since patient has only had one visit of PT I would like her to continue this for at least a few weeks to see how she does.  She has had some GI upset with Voltaren once daily and states that primary care physician has started her on Pepcid.  If her GI symptoms worsen she probably should see a gastroenterologist.  I do not feel comfortable advising her to increase her dose of Voltaren and will leave this up to her primary care physician.  Follow-up with me in 5 weeks for recheck.  If she has not had any improvement with current plan in place I will schedule lumbar MRI to rule out HNP/stenosis.  Patient knows to contact me sooner if her condition worsens.  All questions answered.  Follow-Up Instructions: Return in about 5 weeks (around 02/15/2020) for with [facility] recheck lumbar.   Orders:  No orders of the defined types were placed in this encounter.  No orders of the defined types were placed in this encounter.     Procedures: No procedures performed   Clinical Data: No additional findings.   Subjective: Chief Complaint  Patient presents with  . Lower Back - Follow-up    HPI 49 year old black female returns for recheck of her low back pain and left lower extremity radiculopathy.  She continues have intermittent episodes of pain radiating down to her left thigh.  She has not increased Voltaren dose.  She has been having some GI upset with this and is currently taking Pepcid per primary care physician.  She has not seen a gastroenterologist.  Also taking muscle relaxer and Tylenol at night.  She is had  her first visit with physical therapist yesterday.  Patient states that she has had back problems off and on for several years.  Thinks that she may have had a lumbar MRI back in 2015.   Review of Systems Has had some GI upset with oral NSAID  Objective: Vital Signs: BP 124/84   Pulse 73   Ht 5\' 11"  (1.803 m)   Wt 195 lb (88.5 kg)   BMI 27.20 kg/m   Physical Exam HENT:     Head: Normocephalic.  Pulmonary:     Effort: No respiratory distress.  Musculoskeletal:     Comments: Gait is normal.  Patient is tender over the left sciatic notch.  Negative on the right side.  Negative logroll bilateral hips.  Left straight leg raise shows that she has tight left hamstring.  Bilateral calves nontender.  Neuro vas intact.  No focal motor deficits.  Neurological:     General: No focal deficit present.     Mental Status: She is alert and oriented to person, place, and time.     Ortho Exam  Specialty Comments:  No specialty comments available.  Imaging: No results found.   PMFS History: Patient Active Problem List   Diagnosis Date Noted  . Need for Tdap vaccination  12/06/2019  . Symptomatic varicose veins, bilateral 09/01/2018  . Dyspepsia 01/18/2017  . Back pain with sciatica 09/20/2014  . Solitary lung nodule 09/05/2014  . Hyperlipidemia LDL goal <100 01/26/2014  . Overweight (BMI 25.0-29.9) 01/26/2014  . Allergic rhinitis 09/08/2010  . OTH ABNORMAL BRAIN & CNS FUNCTION STUDY 02/16/2010  . GOITER, UNSPECIFIED 02/10/2010  . Essential hypertension, benign 05/02/2009   Past Medical History:  Diagnosis Date  . Essential hypertension, benign   . Migraine headache 2007  . Obesity    Since childhood   . Pneumonia 2007  . Spinal disease   . Supraventricular tachycardia [facility])    Status post RFA July 2015 - Dr. Lovena Le  . Type 2 diabetes mellitus ([place])   . Weight loss    Hopitalized 4 years ago after loosing 200lb on a supervised program, malnurished with severe protein  deficiency     Family History  Problem Relation Age of Onset  . Hypertension Mother   . Diabetes Mother   . Cancer Mother        Uterine   . Heart failure Mother        CABG  . Cancer Father 55       Prostate   . Hypertension Father   . Bradycardia Father        Irregular heart beat   . Diabetes Brother   . Diabetes Brother   . Hypertension Brother   . Hypertension Brother     Past Surgical History:  Procedure Laterality Date  . ABLATION  12-28-2013   slow pathway modification of AVNRT by Dr Lovena Le  . COLONOSCOPY N/A 11/17/2019   Procedure: COLONOSCOPY;  Surgeon: Daneil Dolin, MD;  [facility];  Service: Endoscopy;  Laterality: N/A;  7:30  . ELECTROPHYSIOLOGY STUDY N/A 12/28/2013   Procedure: ELECTROPHYSIOLOGY STUDY;  Surgeon: Evans Lance, MD;  [facility] CATH LAB;  Service: Cardiovascular;  Laterality: N/A;  . SUPRAVENTRICULAR TACHYCARDIA ABLATION N/A 12/28/2013   Procedure: SUPRAVENTRICULAR TACHYCARDIA ABLATION;  Surgeon: Evans Lance, MD;  Location: [facility] CATH LAB;  Service: Cardiovascular;  Laterality: N/A;  . TUBAL LIGATION  1996   Social History   Occupational History  . Occupation: English as a second language teacher at [employer]  . Smoking status: Former Smoker    Packs/day: 0.50    Years: 7.00    Pack years: 3.50    Types: Cigarettes    Quit date: 08/05/2012    Years since quitting: 7.4  . Smokeless tobacco: Never Used  . Tobacco comment: quit in 2014  Vaping Use  . Vaping Use: Never used  Substance and Sexual Activity  . Alcohol use: No    Alcohol/week: 0.0 standard drinks  . Drug use: No  . Sexual activity: Not on file

## 2020-01-17 ENCOUNTER — Other Ambulatory Visit: Payer: Self-pay | Admitting: *Deleted

## 2020-01-17 ENCOUNTER — Encounter: Payer: Self-pay | Admitting: Family Medicine

## 2020-01-17 MED ORDER — FAMOTIDINE 40 MG PO TABS: ORAL_TABLET | ORAL | 0 refills | Status: DC

## 2020-01-17 MED FILL — FAMOTIDINE 40 MG TABS: 40 | 30 days supply | Qty: 30 | Fill #0

## 2020-01-18 ENCOUNTER — Other Ambulatory Visit: Payer: Self-pay

## 2020-01-18 ENCOUNTER — Ambulatory Visit (HOSPITAL_COMMUNITY): Payer: No Typology Code available for payment source | Admitting: Physical Therapy

## 2020-01-18 DIAGNOSIS — G8929 Other chronic pain: Secondary | ICD-10-CM

## 2020-01-18 DIAGNOSIS — M545 Low back pain, unspecified: Secondary | ICD-10-CM

## 2020-01-18 DIAGNOSIS — M6281 Muscle weakness (generalized): Secondary | ICD-10-CM

## 2020-01-18 NOTE — Therapy (Signed)
[place], [place], [address] Phone: [phone]   Fax:  [phone]  Physical Therapy Treatment  Patient Details  Name: [name] MRN: [id number] Date of Birth: 1970/07/02 Referring Provider (PT): Basil Dess, MD   Encounter Date: 01/18/2020   PT End of Session - 01/18/20 1700    Visit Number 2    Number of Visits 8    Date for PT Re-Evaluation 02/21/20    Authorization Type [place] focus, $40 copay    Progress Note Due on Visit 10    PT Start Time 1620    PT Stop Time 1652    PT Time Calculation (min) 32 min    Activity Tolerance Patient tolerated treatment well    Behavior During Therapy [facility] for tasks assessed/performed           Past Medical History:  Diagnosis Date  . Essential hypertension, benign   . Migraine headache 2007  . Obesity    Since childhood   . Pneumonia 2007  . Spinal disease   . Supraventricular tachycardia [facility])    Status post RFA July 2015 - Dr. Lovena Le  . Type 2 diabetes mellitus ([place])   . Weight loss    Hopitalized 4 years ago after loosing 200lb on a supervised program, malnurished with severe protein deficiency     Past Surgical History:  Procedure Laterality Date  . ABLATION  12-28-2013   slow pathway modification of AVNRT by Dr Lovena Le  . COLONOSCOPY N/A 11/17/2019   Procedure: COLONOSCOPY;  Surgeon: Daneil Dolin, MD;  [facility];  Service: Endoscopy;  Laterality: N/A;  7:30  . ELECTROPHYSIOLOGY STUDY N/A 12/28/2013   Procedure: ELECTROPHYSIOLOGY STUDY;  Surgeon: Evans Lance, MD;  Location: [facility] CATH LAB;  Service: Cardiovascular;  Laterality: N/A;  . SUPRAVENTRICULAR TACHYCARDIA ABLATION N/A 12/28/2013   Procedure: SUPRAVENTRICULAR TACHYCARDIA ABLATION;  Surgeon: Evans Lance, MD;  Location[facility] CATH LAB;  Service: Cardiovascular;  Laterality: N/A;  . TUBAL LIGATION  1996    There were no vitals filed for this  visit.                      [place] Adult PT Treatment/Exercise - 01/18/20 0001      Lumbar Exercises: Supine   Ab Set 10 reps    Bridge 10 reps;Limitations    Bridge Limitations 2 sets of 10    Straight Leg Raise 10 reps;Limitations    Straight Leg Raises Limitations 2 sets of 10 each      Lumbar Exercises: Sidelying   Hip Abduction Both;10 reps;Limitations      Manual Therapy   Manual Therapy Muscle Energy Technique    Manual therapy comments completed at beginning of session    Muscle Energy Technique to correct Lt anterior rotation of Lt SI                  PT Education - 01/18/20 1700    Education Details goals, POC moving forward.  Added to HEP    Person(s) Educated Patient    Methods Explanation;Demonstration;Tactile cues;Verbal cues;Handout    Comprehension Verbalized understanding;Returned demonstration;Verbal cues required;Tactile cues required;Need further instruction            PT Short Term Goals - 01/18/20 1641      PT SHORT TERM GOAL #1   Title Patient will report at least 25% improvement in overall symptoms and/or function to demonstrate improved  functional mobility    Time 3    Period Weeks    Status On-going    Target Date 01/31/20      PT SHORT TERM GOAL #2   Title Patient will be independent in self management strategies to improve quality of life and functional outcomes.    Time 3    Period Weeks    Status On-going    Target Date 01/31/20      PT SHORT TERM GOAL #3   Title Patient will be able to lay prone for 10 minutes at a time without pain to improve ability to lay in this position.    Time 3    Period Weeks    Status On-going    Target Date 01/31/20             PT Long Term Goals - 01/18/20 1643      PT LONG TERM GOAL #1   Title Patient will be able to perform walking program she was previously doing without difficulties.    Time 6    Period Weeks    Status On-going      PT LONG TERM GOAL #2   Title  Patient will improve on FOTO score to meet predicted outcomes to demonstrate improved functional mobility.    Time 6    Period Weeks    Status On-going      PT LONG TERM GOAL #3   Title Patient will report at least 50% improvement in overall symptoms and/or function to demonstrate improved functional mobility    Time 6    Period Weeks    Status On-going                 Plan - 01/18/20 1703    Clinical Impression Statement Reviewed goals, HEP and POC moving forward.  Assesses SIJ with noted subtle anterior rotation of LT SI.  MET completed for Lt ant rotation and instructed with glute strengthening and SLR following.  Pt with noted weakness in Lt glute and bil hips in general.  Pt with cues to keep knee fully extended with SLR.  Updated HEP to include SLR and bridge    Examination-Activity Limitations Sleep;Squat;Stairs;Stand;Transfers    Examination-Participation Restrictions Meal Prep;Driving;Community Activity;Cleaning    Stability/Clinical Decision Making Stable/Uncomplicated    Rehab Potential Good    PT Frequency Other (comment)   8 visits over 6 week certification   PT Duration 6 weeks    PT Treatment/Interventions ADLs/Self Care Home Management;Aquatic Therapy;Biofeedback;Cryotherapy;Electrical Stimulation;Iontophoresis 34m/ml Dexamethasone;Moist Heat;Traction;Balance training;Therapeutic exercise;Therapeutic activities;Functional mobility training;Stair training;Gait training;DME Instruction;Neuromuscular re-education;Patient/family education;Manual techniques;Dry needling;Passive range of motion;Joint Manipulations    PT Next Visit Plan TRA activation and breathing exercise.  Assess SIJ alignment with MET completed at needed.  Progress lumabr mobility exercises and hip strength.  Begin vector stance.    PT Home Exercise Plan 7/21 upper TRA activation    Consulted and Agree with Plan of Care Patient           Patient will benefit from skilled therapeutic intervention in  order to improve the following deficits and impairments:  Decreased activity tolerance, Decreased balance, Pain, Decreased strength, Decreased range of motion, Decreased mobility, Decreased endurance  Visit Diagnosis: Muscle weakness (generalized)  Chronic midline low back pain without sciatica     Problem List Patient Active Problem List   Diagnosis Date Noted  . Need for Tdap vaccination 12/06/2019  . Symptomatic varicose veins, bilateral 09/01/2018  . Dyspepsia 01/18/2017  . Back pain with  sciatica 09/20/2014  . Solitary lung nodule 09/05/2014  . Hyperlipidemia LDL goal <100 01/26/2014  . Overweight (BMI 25.0-29.9) 01/26/2014  . Allergic rhinitis 09/08/2010  . OTH ABNORMAL BRAIN & CNS FUNCTION STUDY 02/16/2010  . GOITER, UNSPECIFIED 02/10/2010  . Essential hypertension, benign 05/02/2009   Teena Irani, PTA/CLT [phone]  Teena Irani 01/18/2020, 5:10 PM  [facility] [address] [place], [place], [address] Phone: [phone]   Fax:  [phone]  Name: [name] MRN: [id number] Date of Birth: Apr 19, 1971

## 2020-01-18 NOTE — Patient Instructions (Signed)
Bridge    Lie back, legs bent. Inhale, pressing hips up. Keeping ribs in, lengthen lower back. Exhale, rolling down along spine from top. Repeat _10___ times. Do __2__ sessions per day.    Straight Leg Raise    Tighten stomach and slowly raise locked right leg __15__ inches from floor. Repeat _10___ times per set. Do __2_ sessions per day.

## 2020-01-30 ENCOUNTER — Other Ambulatory Visit: Payer: Self-pay

## 2020-01-30 ENCOUNTER — Encounter (HOSPITAL_COMMUNITY): Payer: Self-pay

## 2020-01-30 ENCOUNTER — Ambulatory Visit (HOSPITAL_COMMUNITY): Payer: No Typology Code available for payment source | Attending: Specialist

## 2020-01-30 DIAGNOSIS — M6281 Muscle weakness (generalized): Secondary | ICD-10-CM | POA: Diagnosis present

## 2020-01-30 DIAGNOSIS — G8929 Other chronic pain: Secondary | ICD-10-CM | POA: Diagnosis present

## 2020-01-30 DIAGNOSIS — M545 Low back pain, unspecified: Secondary | ICD-10-CM

## 2020-01-30 NOTE — Therapy (Addendum)
[place], [place], [address] Phone: [phone]   Fax:  [phone]  Physical Therapy Treatment  Patient Details  Name: [name] MRN: [id number] Date of Birth: 02-08-1971 Referring Provider (PT): Basil Dess, MD   Encounter Date: 01/30/2020   PT End of Session - 01/30/20 1722    Visit Number 3    Number of Visits 8    Date for PT Re-Evaluation 02/21/20    Authorization Type [employer] focus, $40 copay    Progress Note Due on Visit 10    PT Start Time 1702    PT Stop Time 1742    PT Time Calculation (min) 40 min    Activity Tolerance Patient tolerated treatment well    Behavior During Therapy [facility] for tasks assessed/performed           Past Medical History:  Diagnosis Date  . Essential hypertension, benign   . Migraine headache 2007  . Obesity    Since childhood   . Pneumonia 2007  . Spinal disease   . Supraventricular tachycardia [facility])    Status post RFA July 2015 - Dr. Lovena Le  . Type 2 diabetes mellitus ([place])   . Weight loss    Hopitalized 4 years ago after loosing 200lb on a supervised program, malnurished with severe protein deficiency     Past Surgical History:  Procedure Laterality Date  . ABLATION  12-28-2013   slow pathway modification of AVNRT by Dr Lovena Le  . COLONOSCOPY N/A 11/17/2019   Procedure: COLONOSCOPY;  Surgeon: Daneil Dolin, MD;  [facility];  Service: Endoscopy;  Laterality: N/A;  7:30  . ELECTROPHYSIOLOGY STUDY N/A 12/28/2013   Procedure: ELECTROPHYSIOLOGY STUDY;  Surgeon: Evans Lance, MD;  Location: [facility] CATH LAB;  Service: Cardiovascular;  Laterality: N/A;  . SUPRAVENTRICULAR TACHYCARDIA ABLATION N/A 12/28/2013   Procedure: SUPRAVENTRICULAR TACHYCARDIA ABLATION;  Surgeon: Evans Lance, MD;  Location: [facility] CATH LAB;  Service: Cardiovascular;  Laterality: N/A;  . TUBAL LIGATION  1996    There were no vitals filed for this visit.   Subjective Assessment -  01/30/20 1706    Subjective Pt stated LBP and radicular symptoms Lt anterior thigh a little past the knee.  Reports she went to the beach last week and had increased numbness almost to her feet.    Pertinent History DB, chronic low back pain    Patient Stated Goals Wants to know what she can do without hurting back.    Currently in Pain? Yes    Pain Score 4     Pain Location Back    Pain Orientation Lower;Left    Pain Type Chronic pain    Pain Radiating Towards Lt anterior/lateral thigh a little past knee.    Pain Onset More than a month ago    Pain Frequency Constant    Aggravating Factors  sitting, standing, bending    Pain Relieving Factors medication.                             [facility] Adult PT Treatment/Exercise - 01/30/20 0001      Exercises   Exercises Lumbar      Lumbar Exercises: Stretches   Prone on Elbows Stretch Other (comment)    Prone on Elbows Stretch Limitations pillow under hips x 2 minutes      Lumbar Exercises: Standing   Other Standing Lumbar Exercises Vector stance  3x 5" with need for HHA      Lumbar Exercises: Supine   AB Set Limitations 2 minutes inhale feeling belly, exhale paired wiht ab set    Bridge 10 reps    Bridge Limitations paired with exhale and ab set, 10" in air    Other Supine Lumbar Exercises Isometric abd wiht belt 10x 5"    Other Supine Lumbar Exercises Isometric add wiht pink ball 10x 5"      Lumbar Exercises: Prone   Other Prone Lumbar Exercises heel squeeze 5x 5"      Manual Therapy   Manual Therapy Other (comment)    Manual therapy comments no manual complete this session    Other Manual Therapy assessed SI alignment, no MET required this session.                     PT Short Term Goals - 01/18/20 1641      PT SHORT TERM GOAL #1   Title Patient will report at least 25% improvement in overall symptoms and/or function to demonstrate improved functional mobility    Time 3    Period Weeks    Status  On-going    Target Date 01/31/20      PT SHORT TERM GOAL #2   Title Patient will be independent in self management strategies to improve quality of life and functional outcomes.    Time 3    Period Weeks    Status On-going    Target Date 01/31/20      PT SHORT TERM GOAL #3   Title Patient will be able to lay prone for 10 minutes at a time without pain to improve ability to lay in this position.    Time 3    Period Weeks    Status On-going    Target Date 01/31/20             PT Long Term Goals - 01/18/20 1643      PT LONG TERM GOAL #1   Title Patient will be able to perform walking program she was previously doing without difficulties.    Time 6    Period Weeks    Status On-going      PT LONG TERM GOAL #2   Title Patient will improve on FOTO score to meet predicted outcomes to demonstrate improved functional mobility.    Time 6    Period Weeks    Status On-going      PT LONG TERM GOAL #3   Title Patient will report at least 50% improvement in overall symptoms and/or function to demonstrate improved functional mobility    Time 6    Period Weeks    Status On-going                 Plan - 01/30/20 1722    Clinical Impression Statement Began session assessing SI alignment with good alignment noted, no MET required.  Session focus on core and proximal strenghtneing.  Added pubic clearing exercises with reports of pain reduction following.  Min cueing to pair exhalation with abdominal sets and bracing prior movement for pain control.  Added POE with pillow under hips for comfort, pt reports radicular symptoms resolved following position.  Added vector stance for hip and core engagment, did require some HHA for difficulty with SLS.  EOS LBP still around 3-4/10, no radicular symptoms leaving.    Examination-Activity Limitations Sleep;Squat;Stairs;Stand;Transfers    Examination-Participation Restrictions Meal Prep;Driving;Community Activity;Cleaning  Stability/Clinical  Decision Making Stable/Uncomplicated    Clinical Decision Making Low    Rehab Potential Good    PT Frequency --   8 sessions within 6 weeks   PT Duration 6 weeks    PT Treatment/Interventions ADLs/Self Care Home Management;Aquatic Therapy;Biofeedback;Cryotherapy;Electrical Stimulation;Iontophoresis 63m/ml Dexamethasone;Moist Heat;Traction;Balance training;Therapeutic exercise;Therapeutic activities;Functional mobility training;Stair training;Gait training;DME Instruction;Neuromuscular re-education;Patient/family education;Manual techniques;Dry needling;Passive range of motion;Joint Manipulations    PT Next Visit Plan TRA activation and breathing exercise.  Assess SIJ alignment with MET completed at needed.  Progress lumabr mobility exercises and hip strength.    PT Home Exercise Plan 7/21 upper TRA activation           Patient will benefit from skilled therapeutic intervention in order to improve the following deficits and impairments:  Decreased activity tolerance, Decreased balance, Pain, Decreased strength, Decreased range of motion, Decreased mobility, Decreased endurance  Visit Diagnosis: Chronic midline low back pain without sciatica  Muscle weakness (generalized)     Problem List Patient Active Problem List   Diagnosis Date Noted  . Need for Tdap vaccination 12/06/2019  . Symptomatic varicose veins, bilateral 09/01/2018  . Dyspepsia 01/18/2017  . Back pain with sciatica 09/20/2014  . Solitary lung nodule 09/05/2014  . Hyperlipidemia LDL goal <100 01/26/2014  . Overweight (BMI 25.0-29.9) 01/26/2014  . Allergic rhinitis 09/08/2010  . OTH ABNORMAL BRAIN & CNS FUNCTION STUDY 02/16/2010  . GOITER, UNSPECIFIED 02/10/2010  . Essential hypertension, benign 05/02/2009    CIhor Austin LPTA/CLT; CBIS [phone] CAldona Lento8/03/2020, 5:50 PM  [place] [address]Phone: [phone]   Fax:  [phone] Name: TMARCINE GADWAYMRN: [id number] of Birth: [birth year]

## 2020-02-01 ENCOUNTER — Encounter (HOSPITAL_COMMUNITY): Payer: Self-pay

## 2020-02-01 ENCOUNTER — Encounter: Payer: No Typology Code available for payment source | Admitting: Family Medicine

## 2020-02-01 ENCOUNTER — Other Ambulatory Visit: Payer: Self-pay

## 2020-02-01 ENCOUNTER — Ambulatory Visit (HOSPITAL_COMMUNITY): Payer: No Typology Code available for payment source

## 2020-02-01 DIAGNOSIS — M545 Low back pain: Secondary | ICD-10-CM | POA: Diagnosis not present

## 2020-02-01 DIAGNOSIS — M6281 Muscle weakness (generalized): Secondary | ICD-10-CM

## 2020-02-01 DIAGNOSIS — G8929 Other chronic pain: Secondary | ICD-10-CM

## 2020-02-01 NOTE — Therapy (Signed)
[place], [place], [address] Phone: [phone]   Fax:  [phone]  Physical Therapy Treatment  Patient Details  Name: [name] MRN: [id number] Date of Birth: 1970/08/16 Referring Provider (PT): Basil Dess, MD   Encounter Date: 02/01/2020   PT End of Session - 02/01/20 1717    Visit Number 4    Number of Visits 8    Date for PT Re-Evaluation 02/21/20    Authorization Type  focus, $40 copay    Progress Note Due on Visit 10    PT Start Time 1704    PT Stop Time 1743    PT Time Calculation (min) 39 min    Activity Tolerance Patient tolerated treatment well    Behavior During Therapy [facility] for tasks assessed/performed           Past Medical History:  Diagnosis Date  . Essential hypertension, benign   . Migraine headache 2007  . Obesity    Since childhood   . Pneumonia 2007  . Spinal disease   . Supraventricular tachycardia [facility])    Status post RFA July 2015 - Dr. Lovena Le  . Type 2 diabetes mellitus ([place])   . Weight loss    Hopitalized 4 years ago after loosing 200lb on a supervised program, malnurished with severe protein deficiency     Past Surgical History:  Procedure Laterality Date  . ABLATION  12-28-2013   slow pathway modification of AVNRT by Dr Lovena Le  . COLONOSCOPY N/A 11/17/2019   Procedure: COLONOSCOPY;  Surgeon: Daneil Dolin, MD;  [facility];  Service: Endoscopy;  Laterality: N/A;  7:30  . ELECTROPHYSIOLOGY STUDY N/A 12/28/2013   Procedure: ELECTROPHYSIOLOGY STUDY;  Surgeon: Evans Lance, MD;  Location: [facility] CATH LAB;  Service: Cardiovascular;  Laterality: N/A;  . SUPRAVENTRICULAR TACHYCARDIA ABLATION N/A 12/28/2013   Procedure: SUPRAVENTRICULAR TACHYCARDIA ABLATION;  Surgeon: Evans Lance, MD;  Location: [facility] CATH LAB;  Service: Cardiovascular;  Laterality: N/A;  . TUBAL LIGATION  1996    There were no vitals filed for this visit.   Subjective Assessment -  02/01/20 1709    Subjective Reports her husband had foot surgery yesterday and has had to get up and down for him.  Stated LBP scale 4/10 dull achey pain and some stiffness.    Pertinent History DB, chronic low back pain    Patient Stated Goals Wants to know what she can do without hurting back.    Currently in Pain? Yes    Pain Location Back    Pain Orientation Lower    Pain Descriptors / Indicators Aching;Sore;Dull    Pain Type Chronic pain    Pain Radiating Towards No reports of radicular symptoms today    Pain Onset More than a month ago    Pain Frequency Constant    Aggravating Factors  sitting, standing, bending    Pain Relieving Factors medication                             [facility] Adult PT Treatment/Exercise - 02/01/20 0001      Exercises   Exercises Lumbar      Lumbar Exercises: Stretches   Lower Trunk Rotation 5 reps;10 seconds    Prone on Elbows Stretch Other (comment)    Prone on Elbows Stretch Limitations pillow under hips x 2 minutes      Lumbar Exercises: Standing  Other Standing Lumbar Exercises 3D hip excursion 10x (cueing for mechanics )      Lumbar Exercises: Supine   AB Set Limitations 2 minutes inhale feeling belly, exhale paired wiht ab set    Bridge 10 reps    Bridge Limitations paired with exhale and ab set, 10" in air    Other Supine Lumbar Exercises 90/90 one foot drop then return with ab set 3sets x 5 reps      Lumbar Exercises: Prone   Other Prone Lumbar Exercises heel squeeze 5x 5"    Other Prone Lumbar Exercises windshield wipers      Lumbar Exercises: Quadruped   Madcat/Old Horse 5 reps    Madcat/Old Horse Limitations 10" holds      Manual Therapy   Manual Therapy Other (comment)    Manual therapy comments no manual complete this session    Other Manual Therapy assessed SI alignment, no MET required this session.                     PT Short Term Goals - 01/18/20 1641      PT SHORT TERM GOAL #1   Title  Patient will report at least 25% improvement in overall symptoms and/or function to demonstrate improved functional mobility    Time 3    Period Weeks    Status On-going    Target Date 01/31/20      PT SHORT TERM GOAL #2   Title Patient will be independent in self management strategies to improve quality of life and functional outcomes.    Time 3    Period Weeks    Status On-going    Target Date 01/31/20      PT SHORT TERM GOAL #3   Title Patient will be able to lay prone for 10 minutes at a time without pain to improve ability to lay in this position.    Time 3    Period Weeks    Status On-going    Target Date 01/31/20             PT Long Term Goals - 01/18/20 1643      PT LONG TERM GOAL #1   Title Patient will be able to perform walking program she was previously doing without difficulties.    Time 6    Period Weeks    Status On-going      PT LONG TERM GOAL #2   Title Patient will improve on FOTO score to meet predicted outcomes to demonstrate improved functional mobility.    Time 6    Period Weeks    Status On-going      PT LONG TERM GOAL #3   Title Patient will report at least 50% improvement in overall symptoms and/or function to demonstrate improved functional mobility    Time 6    Period Weeks    Status On-going                 Plan - 02/01/20 1745    Clinical Impression Statement SI within alignement, no MET required.  Session focus on core stability and proximal strengthening as well as additional exercises for mobility.  Pt improving abdominal contractions following cueing for pairing TRA with exhalation.  Noted visible abdominal fatigue with additoinal exercises.  Added 3D hip excursion, LTR and prone windsheild wipers for mobility.  No reports of pain or radicular symptoms through sessoin.    Examination-Activity Limitations Sleep;Squat;Stairs;Stand;Transfers    Examination-Participation Restrictions Meal Prep;Driving;Community  Activity;Cleaning      Stability/Clinical Decision Making Stable/Uncomplicated    Clinical Decision Making Low    Rehab Potential Good    PT Frequency --   8 sessions within 6 weeks   PT Duration 6 weeks    PT Treatment/Interventions ADLs/Self Care Home Management;Aquatic Therapy;Biofeedback;Cryotherapy;Electrical Stimulation;Iontophoresis 30m/ml Dexamethasone;Moist Heat;Traction;Balance training;Therapeutic exercise;Therapeutic activities;Functional mobility training;Stair training;Gait training;DME Instruction;Neuromuscular re-education;Patient/family education;Manual techniques;Dry needling;Passive range of motion;Joint Manipulations    PT Next Visit Plan TRA activation and breathing exercise.  Assess SIJ alignment with MET completed at needed.  Progress lumabr mobility exercises and hip strength.    PT Home Exercise Plan 7/21 upper TRA activation           Patient will benefit from skilled therapeutic intervention in order to improve the following deficits and impairments:  Decreased activity tolerance, Decreased balance, Pain, Decreased strength, Decreased range of motion, Decreased mobility, Decreased endurance  Visit Diagnosis: Chronic midline low back pain without sciatica  Muscle weakness (generalized)     Problem List Patient Active Problem List   Diagnosis Date Noted  . Need for Tdap vaccination 12/06/2019  . Symptomatic varicose veins, bilateral 09/01/2018  . Dyspepsia 01/18/2017  . Back pain with sciatica 09/20/2014  . Solitary lung nodule 09/05/2014  . Hyperlipidemia LDL goal <100 01/26/2014  . Overweight (BMI 25.0-29.9) 01/26/2014  . Allergic rhinitis 09/08/2010  . OTH ABNORMAL BRAIN & CNS FUNCTION STUDY 02/16/2010  . GOITER, UNSPECIFIED 02/10/2010  . Essential hypertension, benign 05/02/2009   CIhor Austin LPTA/CLT; CBIS [phone] CAldona Lento8/05/2020, 5:48 PM  [id number]  [address]Phone: [phone]  Fax:  [phone] Name: TJANIJAH SYMONSMRN: [id number] of Birth: [birth year]

## 2020-02-07 ENCOUNTER — Encounter (HOSPITAL_COMMUNITY): Payer: Self-pay

## 2020-02-07 ENCOUNTER — Other Ambulatory Visit: Payer: Self-pay

## 2020-02-07 ENCOUNTER — Ambulatory Visit (HOSPITAL_COMMUNITY): Payer: No Typology Code available for payment source

## 2020-02-07 DIAGNOSIS — M6281 Muscle weakness (generalized): Secondary | ICD-10-CM

## 2020-02-07 DIAGNOSIS — G8929 Other chronic pain: Secondary | ICD-10-CM

## 2020-02-07 DIAGNOSIS — M545 Low back pain, unspecified: Secondary | ICD-10-CM

## 2020-02-07 NOTE — Therapy (Signed)
[place], [place], [address] Phone: [phone]   Fax:  [phone]  Physical Therapy Treatment  Patient Details  Name: [name] MRN: [id number] Date of Birth: [birth year] Referring Provider (PT): Basil Dess, MD   Encounter Date: 02/07/2020   PT End of Session - 02/07/20 1622    Visit Number 5    Number of Visits 8    Date for PT Re-Evaluation 02/21/20    Authorization Type [employer] focus, $40 copay    Progress Note Due on Visit 10    PT Start Time 1618    PT Stop Time 1656    PT Time Calculation (min) 38 min    Activity Tolerance Patient tolerated treatment well    Behavior During Therapy [facility] for tasks assessed/performed           Past Medical History:  Diagnosis Date  . Essential hypertension, benign   . Migraine headache 2007  . Obesity    Since childhood   . Pneumonia 2007  . Spinal disease   . Supraventricular tachycardia [facility])    Status post RFA July 2015 - Dr. Lovena Le  . Type 2 diabetes mellitus ([place])   . Weight loss    Hopitalized 4 years ago after loosing 200lb on a supervised program, malnurished with severe protein deficiency     Past Surgical History:  Procedure Laterality Date  . ABLATION  12-28-2013   slow pathway modification of AVNRT by Dr Lovena Le  . COLONOSCOPY N/A 11/17/2019   Procedure: COLONOSCOPY;  Surgeon: Daneil Dolin, MD;  [facility];  Service: Endoscopy;  Laterality: N/A;  7:30  . ELECTROPHYSIOLOGY STUDY N/A 12/28/2013   Procedure: ELECTROPHYSIOLOGY STUDY;  Surgeon: Evans Lance, MD;  Location: [facility] CATH LAB;  Service: Cardiovascular;  Laterality: N/A;  . SUPRAVENTRICULAR TACHYCARDIA ABLATION N/A 12/28/2013   Procedure: SUPRAVENTRICULAR TACHYCARDIA ABLATION;  Surgeon: Evans Lance, MD;  Location: [facility] CATH LAB;  Service: Cardiovascular;  Laterality: N/A;  . TUBAL LIGATION  1996    There were no vitals filed for this visit.   Subjective Assessment -  02/07/20 1620    Subjective Pt arrived with heat pack on lower back for pain control.  Current pain scale 5/10 dull achey pain.    Pertinent History DB, chronic low back pain    Patient Stated Goals Wants to know what she can do without hurting back.    Currently in Pain? Yes    Pain Score 5     Pain Location Back    Pain Orientation Lower    Pain Descriptors / Indicators Aching;Sore;Dull    Pain Type Chronic pain    Pain Radiating Towards No reports of radicular symptoms for 2 weeks.    Pain Onset More than a month ago    Pain Frequency Constant    Aggravating Factors  sitting, standing, bending    Pain Relieving Factors medication                             [facility] Adult PT Treatment/Exercise - 02/07/20 0001      Exercises   Exercises Lumbar      Lumbar Exercises: Standing   Functional Squats 10 reps    Functional Squats Limitations front of mat for mechanics    Shoulder Extension Both;15 reps;Theraband    Shoulder Extension Limitations marching 3" holds    Other Standing Lumbar Exercises Vector  stance 3x 5" with intermittent HHA    Other Standing Lumbar Exercises Paloff in tandem stance 4x 15      Lumbar Exercises: Supine   Bridge 10 reps    Bridge Limitations paired with exhale and ab set, 10" in air    Other Supine Lumbar Exercises 90/90 one foot drop then return with ab set 3sets x 5 reps      Lumbar Exercises: Quadruped   Madcat/Old Horse 5 reps    Madcat/Old Horse Limitations 10" holds    Single Arm Raise Right;Left;5 reps;3 seconds    Straight Leg Raise 5 reps;3 seconds      Manual Therapy   Manual Therapy Other (comment)    Manual therapy comments no manual complete this session    Other Manual Therapy assessed SI alignment, no MET required this session.                     PT Short Term Goals - 01/18/20 1641      PT SHORT TERM GOAL #1   Title Patient will report at least 25% improvement in overall symptoms and/or function to  demonstrate improved functional mobility    Time 3    Period Weeks    Status On-going    Target Date 01/31/20      PT SHORT TERM GOAL #2   Title Patient will be independent in self management strategies to improve quality of life and functional outcomes.    Time 3    Period Weeks    Status On-going    Target Date 01/31/20      PT SHORT TERM GOAL #3   Title Patient will be able to lay prone for 10 minutes at a time without pain to improve ability to lay in this position.    Time 3    Period Weeks    Status On-going    Target Date 01/31/20             PT Long Term Goals - 01/18/20 1643      PT LONG TERM GOAL #1   Title Patient will be able to perform walking program she was previously doing without difficulties.    Time 6    Period Weeks    Status On-going      PT LONG TERM GOAL #2   Title Patient will improve on FOTO score to meet predicted outcomes to demonstrate improved functional mobility.    Time 6    Period Weeks    Status On-going      PT LONG TERM GOAL #3   Title Patient will report at least 50% improvement in overall symptoms and/or function to demonstrate improved functional mobility    Time 6    Period Weeks    Status On-going                 Plan - 02/07/20 1647    Clinical Impression Statement Progressed core and proximal strengthening with quadruped and standing exercises, cueing for stability with task.  UE fatigued quickly with quadruped activity.  EOS pt reports pain reduced following therex.    Examination-Activity Limitations Sleep;Squat;Stairs;Stand;Transfers    Examination-Participation Restrictions Meal Prep;Driving;Community Activity;Cleaning    Stability/Clinical Decision Making Stable/Uncomplicated    Clinical Decision Making Low    Rehab Potential Good    PT Frequency --   8 sessions over 6 weeks   PT Duration 6 weeks    PT Treatment/Interventions ADLs/Self Care Home Management;Aquatic Therapy;Biofeedback;Cryotherapy;Electrical  Stimulation;Iontophoresis  73m/ml Dexamethasone;Moist Heat;Traction;Balance training;Therapeutic exercise;Therapeutic activities;Functional mobility training;Stair training;Gait training;DME Instruction;Neuromuscular re-education;Patient/family education;Manual techniques;Dry needling;Passive range of motion;Joint Manipulations    PT Next Visit Plan TRA activation and breathing exercise.  Assess SIJ alignment with MET completed at needed.  Progress lumabr mobility exercises and hip strength.    PT Home Exercise Plan 7/21 upper TRA activation           Patient will benefit from skilled therapeutic intervention in order to improve the following deficits and impairments:  Decreased activity tolerance, Decreased balance, Pain, Decreased strength, Decreased range of motion, Decreased mobility, Decreased endurance  Visit Diagnosis: Chronic midline low back pain without sciatica  Muscle weakness (generalized)     Problem List Patient Active Problem List   Diagnosis Date Noted  . Need for Tdap vaccination 12/06/2019  . Symptomatic varicose veins, bilateral 09/01/2018  . Dyspepsia 01/18/2017  . Back pain with sciatica 09/20/2014  . Solitary lung nodule 09/05/2014  . Hyperlipidemia LDL goal <100 01/26/2014  . Overweight (BMI 25.0-29.9) 01/26/2014  . Allergic rhinitis 09/08/2010  . OTH ABNORMAL BRAIN & CNS FUNCTION STUDY 02/16/2010  . GOITER, UNSPECIFIED 02/10/2010  . Essential hypertension, benign 05/02/2009   CIhor Austin LPTA/CLT; CBIS [phone] CAldona Lento8/18/2021, 5:03 PM  [place] [address]Phone: [phone]  Fax:  [phone] Name: Kelsey GAUGHMRN: [id number] of Birth: [birth year]

## 2020-02-08 ENCOUNTER — Encounter (HOSPITAL_COMMUNITY): Payer: Self-pay

## 2020-02-08 ENCOUNTER — Ambulatory Visit (HOSPITAL_COMMUNITY): Payer: No Typology Code available for payment source

## 2020-02-08 DIAGNOSIS — M545 Low back pain, unspecified: Secondary | ICD-10-CM

## 2020-02-08 DIAGNOSIS — M6281 Muscle weakness (generalized): Secondary | ICD-10-CM

## 2020-02-08 DIAGNOSIS — G8929 Other chronic pain: Secondary | ICD-10-CM

## 2020-02-08 NOTE — Patient Instructions (Signed)
Lower Trunk Rotation Stretch    Keeping back flat and feet together, rotate knees to left side. Hold 10 seconds. Repeat 5 times per set. Do 1-2 sets per day.  Bridge    Lie back, legs bent. Inhale, pressing hips up. Keeping ribs in, lengthen lower back. Exhale, rolling down along spine from top. Repeat ____ times. Do ____ sessions per day.  http://pm.exer.us/55   Copyright  VHI. All rights reserved.   Abduction: Clam (Eccentric) - Side-Lying    Lie on side with knees bent. Lift top knee, keeping feet together. Keep trunk steady.  Slowly lower for 3-5 seconds. 10  reps per set, 2 sets per day, 3-4 days per week.  http://ecce.exer.us/65   Copyright  VHI. All rights reserved.   Bracing With Leg March (Hook-Lying)    With neutral spine, tighten pelvic floor and abdominals and hold.  Alternating legs, lift foot 10 inches and return to floor. Repeat 10 times. Do 4 times a week.  Copyright  VHI. All rights reserved.

## 2020-02-08 NOTE — Therapy (Signed)
[place], [place], [address] Phone: [phone]   Fax:  [phone]  Physical Therapy Treatment  Patient Details  Name: [name] MRN: [id number] Date of Birth: Mar 06, 1971 Referring Provider (PT): Basil Dess, MD   Encounter Date: 02/08/2020   PT End of Session - 02/08/20 1701    Visit Number 6    Number of Visits 8    Date for PT Re-Evaluation 02/21/20    Authorization Type [place] focus, $40 copay    Progress Note Due on Visit 10    PT Start Time 1648    PT Stop Time 1730    PT Time Calculation (min) 42 min    Activity Tolerance Patient tolerated treatment well    Behavior During Therapy [facility] for tasks assessed/performed           Past Medical History:  Diagnosis Date  . Essential hypertension, benign   . Migraine headache 2007  . Obesity    Since childhood   . Pneumonia 2007  . Spinal disease   . Supraventricular tachycardia [facility])    Status post RFA July 2015 - Dr. Lovena Le  . Type 2 diabetes mellitus ([place])   . Weight loss    Hopitalized 4 years ago after loosing 200lb on a supervised program, malnurished with severe protein deficiency     Past Surgical History:  Procedure Laterality Date  . ABLATION  12-28-2013   slow pathway modification of AVNRT by Dr Lovena Le  . COLONOSCOPY N/A 11/17/2019   Procedure: COLONOSCOPY;  Surgeon: Daneil Dolin, MD;  [facility];  Service: Endoscopy;  Laterality: N/A;  7:30  . ELECTROPHYSIOLOGY STUDY N/A 12/28/2013   Procedure: ELECTROPHYSIOLOGY STUDY;  Surgeon: Evans Lance, MD;  Location: [facility] CATH LAB;  Service: Cardiovascular;  Laterality: N/A;  . SUPRAVENTRICULAR TACHYCARDIA ABLATION N/A 12/28/2013   Procedure: SUPRAVENTRICULAR TACHYCARDIA ABLATION;  Surgeon: Evans Lance, MD;  Location: [facility] CATH LAB;  Service: Cardiovascular;  Laterality: N/A;  . TUBAL LIGATION  1996    There were no vitals filed for this visit.   Subjective Assessment -  02/08/20 1650    Subjective Pt stated she is feeling good today.  Reports she had a good night sleep following last session.  Has been compliant with HEP 3-4x a week    Pertinent History DB, chronic low back pain    Patient Stated Goals Wants to know what she can do without hurting back.    Currently in Pain? No/denies                             [facility] Adult PT Treatment/Exercise - 02/08/20 0001      Exercises   Exercises Lumbar      Lumbar Exercises: Standing   Heel Raises 10 reps    Heel Raises Limitations squat then heel raise; 2 sets    Functional Squats 10 reps    Functional Squats Limitations squat then heel raise; 2 sets    Forward Lunge 10 reps    Forward Lunge Limitations cueing for form    Row 15 reps;Theraband    Theraband Level (Row) Level 3 (Green)    Shoulder Extension Both;15 reps;Theraband    Theraband Level (Shoulder Extension) Level 3 (Green)    Shoulder Extension Limitations marching alternating 3" holds    Other Standing Lumbar Exercises Vector stance 5x 5" no HHA required    Other  Standing Lumbar Exercises Paloff in tandem stance on foam4x 15      Lumbar Exercises: Quadruped   Madcat/Old Horse 10 reps    Madcat/Old Horse Limitations 10" holds    Single Arm Raise Right;Left;5 reps;3 seconds    Straight Leg Raise 5 reps;3 seconds                    PT Short Term Goals - 01/18/20 1641      PT SHORT TERM GOAL #1   Title Patient will report at least 25% improvement in overall symptoms and/or function to demonstrate improved functional mobility    Time 3    Period Weeks    Status On-going    Target Date 01/31/20      PT SHORT TERM GOAL #2   Title Patient will be independent in self management strategies to improve quality of life and functional outcomes.    Time 3    Period Weeks    Status On-going    Target Date 01/31/20      PT SHORT TERM GOAL #3   Title Patient will be able to lay prone for 10 minutes at a time without  pain to improve ability to lay in this position.    Time 3    Period Weeks    Status On-going    Target Date 01/31/20             PT Long Term Goals - 01/18/20 1643      PT LONG TERM GOAL #1   Title Patient will be able to perform walking program she was previously doing without difficulties.    Time 6    Period Weeks    Status On-going      PT LONG TERM GOAL #2   Title Patient will improve on FOTO score to meet predicted outcomes to demonstrate improved functional mobility.    Time 6    Period Weeks    Status On-going      PT LONG TERM GOAL #3   Title Patient will report at least 50% improvement in overall symptoms and/or function to demonstrate improved functional mobility    Time 6    Period Weeks    Status On-going                 Plan - 02/08/20 1721    Clinical Impression Statement Added lunges for functional strengthening and resumed quadruped activities for core and proximal strengthening.  Pt able to complete all exercises with no reports of increased pain.  DId required cueing for stability with tasks.    Examination-Activity Limitations Sleep;Squat;Stairs;Stand;Transfers    Examination-Participation Restrictions Meal Prep;Driving;Community Activity;Cleaning    Stability/Clinical Decision Making Stable/Uncomplicated    Clinical Decision Making Low    Rehab Potential Good    PT Frequency --   8 session over 6 weeks   PT Duration 6 weeks    PT Treatment/Interventions ADLs/Self Care Home Management;Aquatic Therapy;Biofeedback;Cryotherapy;Electrical Stimulation;Iontophoresis 4mg /ml Dexamethasone;Moist Heat;Traction;Balance training;Therapeutic exercise;Therapeutic activities;Functional mobility training;Stair training;Gait training;DME Instruction;Neuromuscular re-education;Patient/family education;Manual techniques;Dry needling;Passive range of motion;Joint Manipulations    PT Next Visit Plan TRA activation and breathing exercise.  Assess SIJ alignment with  MET completed at needed.  Progress lumabr mobility exercises and hip strength.    PT Home Exercise Plan 7/21 upper TRA activation: bridge, bent knee raise, clam, LTR           Patient will benefit from skilled therapeutic intervention in order to improve the following deficits and  impairments:  Decreased activity tolerance, Decreased balance, Pain, Decreased strength, Decreased range of motion, Decreased mobility, Decreased endurance  Visit Diagnosis: Muscle weakness (generalized)  Chronic midline low back pain without sciatica     Problem List Patient Active Problem List   Diagnosis Date Noted  . Need for Tdap vaccination 12/06/2019  . Symptomatic varicose veins, bilateral 09/01/2018  . Dyspepsia 01/18/2017  . Back pain with sciatica 09/20/2014  . Solitary lung nodule 09/05/2014  . Hyperlipidemia LDL goal <100 01/26/2014  . Overweight (BMI 25.0-29.9) 01/26/2014  . Allergic rhinitis 09/08/2010  . OTH ABNORMAL BRAIN & CNS FUNCTION STUDY 02/16/2010  . GOITER, UNSPECIFIED 02/10/2010  . Essential hypertension, benign 05/02/2009   Ihor Austin, LPTA/CLT; CBIS [phone]  Aldona Lento 02/08/2020, 5:36 PM  [place], [place], [address] Phone: [phone]   Fax:  [phone]  Name: [name] MRN: [id number] Date of Birth: 1970-06-28

## 2020-02-12 ENCOUNTER — Other Ambulatory Visit: Payer: Self-pay

## 2020-02-12 ENCOUNTER — Encounter (HOSPITAL_COMMUNITY): Payer: Self-pay | Admitting: Physical Therapy

## 2020-02-12 ENCOUNTER — Ambulatory Visit (HOSPITAL_COMMUNITY): Payer: No Typology Code available for payment source | Admitting: Physical Therapy

## 2020-02-12 DIAGNOSIS — M545 Low back pain, unspecified: Secondary | ICD-10-CM

## 2020-02-12 DIAGNOSIS — G8929 Other chronic pain: Secondary | ICD-10-CM

## 2020-02-12 DIAGNOSIS — M6281 Muscle weakness (generalized): Secondary | ICD-10-CM

## 2020-02-12 NOTE — Therapy (Signed)
[facility] [address] [place], [place], [address] Phone: [phone]   Fax:  [phone]  Physical Therapy Treatment and Progress Note  Patient Details  Name: [name] MRN: [id number] Date of Birth: Nov 25, 1970 Referring Provider (PT): Basil Dess, MD  Progress Note Reporting Period 01/10/20 to 02/12/20  See note below for Objective Data and Assessment of Progress/Goals.       Encounter Date: 02/12/2020   PT End of Session - 02/12/20 1610    Visit Number 7    Number of Visits 8    Date for PT Re-Evaluation 02/21/20    Authorization Type [place] focus, $40 copay    Progress Note Due on Visit 10    PT Start Time 1613    PT Stop Time 1653    PT Time Calculation (min) 40 min    Activity Tolerance Patient tolerated treatment well    Behavior During Therapy WFL for tasks assessed/performed           Past Medical History:  Diagnosis Date  . Essential hypertension, benign   . Migraine headache 2007  . Obesity    Since childhood   . Pneumonia 2007  . Spinal disease   . Supraventricular tachycardia [facility])    Status post RFA July 2015 - Dr. Lovena Le  . Type 2 diabetes mellitus ([place])   . Weight loss    Hopitalized 4 years ago after loosing 200lb on a supervised program, malnurished with severe protein deficiency     Past Surgical History:  Procedure Laterality Date  . ABLATION  12-28-2013   slow pathway modification of AVNRT by Dr Lovena Le  . COLONOSCOPY N/A 11/17/2019   Procedure: COLONOSCOPY;  Surgeon: Daneil Dolin, MD;  [facility];  Service: Endoscopy;  Laterality: N/A;  7:30  . ELECTROPHYSIOLOGY STUDY N/A 12/28/2013   Procedure: ELECTROPHYSIOLOGY STUDY;  Surgeon: Evans Lance, MD;  Location: [facility] CATH LAB;  Service: Cardiovascular;  Laterality: N/A;  . SUPRAVENTRICULAR TACHYCARDIA ABLATION N/A 12/28/2013   Procedure: SUPRAVENTRICULAR TACHYCARDIA ABLATION;  Surgeon: Evans Lance, MD;  Location: [facility] CATH  LAB;  Service: Cardiovascular;  Laterality: N/A;  . TUBAL LIGATION  1996    There were no vitals filed for this visit.   Subjective Assessment - 02/12/20 1621    Subjective States that she has been having some pain today. Reports 4/10 pain. Reports overall she feels more mobile but continues to have pain. States she feels about 40% better. Wants to be able to walk a whole trail which is what she was previously doing (used to take her about 40 minutes with power walking). States she has been working on her walking and currently can walk about 15 minutes. States she would like to really bend and actually clean her house. States after she bends a good bit she starts getting painful.    Pertinent History DB, chronic low back pain    Patient Stated Goals Wants to know what she can do without hurting back.    Currently in Pain? Yes    Pain Score 4     Pain Location Back    Pain Orientation Lower    Pain Descriptors / Indicators Aching;Tightness              [facility] PT Assessment - 02/12/20 0001      Assessment   Medical Diagnosis LBP    Referring Provider (PT) Basil Dess, MD      Observation/Other Assessments  Focus on Therapeutic Outcomes (FOTO)  52% function, predicted 55% function       AROM   Lumbar Flexion 100% limited   pain in lumbar spine    Lumbar Extension 80% limited   painin lumbar spine   Lumbar - Right Side Bend 50% limited    pain in back    Lumbar - Left Side Bend 75% limited   pain in back                         [facility] Adult PT Treatment/Exercise - 02/12/20 0001      Lumbar Exercises: Standing   Other Standing Lumbar Exercises hip hinges with dowel - 6 minutes - challenging for patient      Lumbar Exercises: Seated   Other Seated Lumbar Exercises PUSH PULL SIJ ISOMETRIC 3X5 5" HOLDS    Other Seated Lumbar Exercises hip abd/add isometrics with hands 3x5 5" holds       Lumbar Exercises: Supine   Other Supine Lumbar Exercises hip add/abd - pt  reported difficulty performing at home laying down x5 5" holds                  PT Education - 02/12/20 1645    Education Details on FOTO score, on progress made and POC moving forward    Person(s) Educated Patient    Methods Explanation    Comprehension Verbalized understanding            PT Short Term Goals - 02/12/20 1624      PT SHORT TERM GOAL #1   Title Patient will report at least 25% improvement in overall symptoms and/or function to demonstrate improved functional mobility    Baseline 40%    Time 3    Period Weeks    Status Achieved    Target Date 01/31/20      PT SHORT TERM GOAL #2   Title Patient will be independent in self management strategies to improve quality of life and functional outcomes.    Time 3    Period Weeks    Status Achieved    Target Date 01/31/20      PT SHORT TERM GOAL #3   Title Patient will be able to lay prone for 10 minutes at a time without pain to improve ability to lay in this position.    Baseline 10-15 minutes    Time 3    Period Weeks    Status Achieved    Target Date 01/31/20             PT Long Term Goals - 02/12/20 1624      PT LONG TERM GOAL #1   Title Patient will be able to perform walking program she was previously doing without difficulties.    Time 6    Period Weeks    Status On-going      PT LONG TERM GOAL #2   Title Patient will improve on FOTO score to meet predicted outcomes to demonstrate improved functional mobility.    Time 6    Period Weeks    Status On-going      PT LONG TERM GOAL #3   Title Patient will report at least 50% improvement in overall symptoms and/or function to demonstrate improved functional mobility    Baseline 40% better    Time 6    Period Weeks    Status On-going  Plan - 02/12/20 1611    Clinical Impression Statement Session focused on education and reviewing patient's goals. Overall patient continues to have limitations in ROM but after  isometrics patient demonstrated significantly improved lumbar mobility and with no pain. Educated patient in performing self isometrics daily throughout the day to see if muscle activation helps with pain and movement. Currently, patient has met all short term goals and is working towards long term goals. Discussed current functional status and patient confident in HEP. Anticipate discharge next session pending patient presentation. Will continue to focus on HEP development and symptoms management.    Examination-Activity Limitations Sleep;Squat;Stairs;Stand;Transfers    Examination-Participation Restrictions Meal Prep;Driving;Community Activity;Cleaning    Stability/Clinical Decision Making Stable/Uncomplicated    Rehab Potential Good    PT Frequency --   8 session over 6 weeks   PT Duration 6 weeks    PT Treatment/Interventions ADLs/Self Care Home Management;Aquatic Therapy;Biofeedback;Cryotherapy;Electrical Stimulation;Iontophoresis 58m/ml Dexamethasone;Moist Heat;Traction;Balance training;Therapeutic exercise;Therapeutic activities;Functional mobility training;Stair training;Gait training;DME Instruction;Neuromuscular re-education;Patient/family education;Manual techniques;Dry needling;Passive range of motion;Joint Manipulations    PT Next Visit Plan f/u SIJ isometrics and hip hinge, review HEP    PT Home Exercise Plan 7/21 upper TRA activation: bridge, bent knee raise, clam, LTR           Patient will benefit from skilled therapeutic intervention in order to improve the following deficits and impairments:  Decreased activity tolerance, Decreased balance, Pain, Decreased strength, Decreased range of motion, Decreased mobility, Decreased endurance  Visit Diagnosis: Muscle weakness (generalized)  Chronic midline low back pain without sciatica     Problem List Patient Active Problem List   Diagnosis Date Noted  . Need for Tdap vaccination 12/06/2019  . Symptomatic varicose veins,  bilateral 09/01/2018  . Dyspepsia 01/18/2017  . Back pain with sciatica 09/20/2014  . Solitary lung nodule 09/05/2014  . Hyperlipidemia LDL goal <100 01/26/2014  . Overweight (BMI 25.0-29.9) 01/26/2014  . Allergic rhinitis 09/08/2010  . OTH ABNORMAL BRAIN & CNS FUNCTION STUDY 02/16/2010  . GOITER, UNSPECIFIED 02/10/2010  . Essential hypertension, benign 05/02/2009   4:57 PM, 02/12/20 MJerene Pitch DPT [facility] [phone]  [place] [address]Phone: [phone]  Fax:  [phone] Name: Kelsey CALLANANMRN: [id number] of Birth: [birth year]

## 2020-02-14 ENCOUNTER — Ambulatory Visit (HOSPITAL_COMMUNITY): Payer: No Typology Code available for payment source | Admitting: Physical Therapy

## 2020-02-14 ENCOUNTER — Encounter (HOSPITAL_COMMUNITY): Payer: Self-pay | Admitting: Physical Therapy

## 2020-02-14 ENCOUNTER — Other Ambulatory Visit: Payer: Self-pay

## 2020-02-14 DIAGNOSIS — M545 Low back pain, unspecified: Secondary | ICD-10-CM

## 2020-02-14 DIAGNOSIS — M6281 Muscle weakness (generalized): Secondary | ICD-10-CM

## 2020-02-14 DIAGNOSIS — G8929 Other chronic pain: Secondary | ICD-10-CM

## 2020-02-14 NOTE — Therapy (Signed)
[place], [place], [address] Phone: [phone]   Fax:  [phone]  Physical Therapy Treatment and Discharge Note  Patient Details  Name: [name] MRN: [id number] Date of Birth: 1971/05/09 Referring Provider (PT): Basil Dess, MD  PHYSICAL THERAPY DISCHARGE SUMMARY  Visits from Start of Care:8  Current functional level related to goals / functional outcomes: Partially met   Remaining deficits: Stiffness and strength defects    Education / Equipment: See below  Plan: Patient agrees to discharge.  Patient goals were partially met. Patient is being discharged due to the physician's request.  ?????        Encounter Date: 02/14/2020   PT End of Session - 02/14/20 1613    Visit Number 8    Number of Visits 8    Date for PT Re-Evaluation 02/21/20    Authorization Type [place] focus, $40 copay    Progress Note Due on Visit 10    PT Start Time 1613    PT Stop Time 1651    PT Time Calculation (min) 38 min    Activity Tolerance Patient tolerated treatment well    Behavior During Therapy [facility] for tasks assessed/performed           Past Medical History:  Diagnosis Date   Essential hypertension, benign    Migraine headache 2007   Obesity    Since childhood    Pneumonia 2007   Spinal disease    Supraventricular tachycardia [facility])    Status post RFA July 2015 - Dr. Lovena Le   Type 2 diabetes mellitus ([place])    Weight loss    Hopitalized 4 years ago after loosing 200lb on a supervised program, malnurished with severe protein deficiency     Past Surgical History:  Procedure Laterality Date   ABLATION  12-28-2013   slow pathway modification of AVNRT by Dr Lovena Le   COLONOSCOPY N/A 11/17/2019   Procedure: COLONOSCOPY;  Surgeon: Daneil Dolin, MD;  [facility];  Service: Endoscopy;  Laterality: N/A;  7:30   ELECTROPHYSIOLOGY STUDY N/A 12/28/2013   Procedure:  ELECTROPHYSIOLOGY STUDY;  Surgeon: Evans Lance, MD;  [facility] CATH LAB;  Service: Cardiovascular;  Laterality: N/A;   SUPRAVENTRICULAR TACHYCARDIA ABLATION N/A 12/28/2013   Procedure: SUPRAVENTRICULAR TACHYCARDIA ABLATION;  Surgeon: Evans Lance, MD;  Location: [facility] CATH LAB;  Service: Cardiovascular;  Laterality: N/A;   TUBAL LIGATION  1996    There were no vitals filed for this visit.   Subjective Assessment - 02/14/20 1615    Subjective States she just has her normal pain about 3/10. Reports otherwise feels good  and has been practicing the seated exercise as it helps her from being real stiff. 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 Reports 45% better    Pertinent History DB, chronic low back pain    Patient Stated Goals Wants to know what she can do without hurting back.    Currently in Pain? Yes    Pain Score 3     Pain Location Back    Pain Orientation Lower              [facility] PT Assessment - 02/14/20 0001      Assessment   Medical Diagnosis LBP    Referring Provider (PT) Basil Dess, MD      Observation/Other Assessments   Focus on Therapeutic Outcomes (FOTO)  52% function, predicted 55% function  [facility] Adult PT Treatment/Exercise - 02/14/20 0001      Therapeutic Activites    Therapeutic Activities Work Insurance account manager transfers with PT as patient - bed to chair - 8 minutes      Lumbar Exercises: Standing   Other Standing Lumbar Exercises squat holds 4x5 5" holds to mimic transfer of patients    Other Standing Lumbar Exercises sumo squat lifts 3x10 with box       Lumbar Exercises: Seated   Other Seated Lumbar Exercises AB exercises with bal 3x5 5" holds --> then seated TRA activation 3x5 5" holds     Other Seated Lumbar Exercises hip abd/add isometrics with hands 3x5 5" holds                   PT Education - 02/14/20 1622    Education Details educated patient on body mechanics and proper  lifting mechanics. reviewed HEP and answered all questions prior to discharge/end of session    Person(s) Educated Patient    Methods Explanation    Comprehension Verbalized understanding            PT Short Term Goals - 02/12/20 1624      PT SHORT TERM GOAL #1   Title Patient will report at least 25% improvement in overall symptoms and/or function to demonstrate improved functional mobility    Baseline 40%    Time 3    Period Weeks    Status Achieved    Target Date 01/31/20      PT SHORT TERM GOAL #2   Title Patient will be independent in self management strategies to improve quality of life and functional outcomes.    Time 3    Period Weeks    Status Achieved    Target Date 01/31/20      PT SHORT TERM GOAL #3   Title Patient will be able to lay prone for 10 minutes at a time without pain to improve ability to lay in this position.    Baseline 10-15 minutes    Time 3    Period Weeks    Status Achieved    Target Date 01/31/20             PT Long Term Goals - 02/12/20 1624      PT LONG TERM GOAL #1   Title Patient will be able to perform walking program she was previously doing without difficulties.    Time 6    Period Weeks    Status On-going      PT LONG TERM GOAL #2   Title Patient will improve on FOTO score to meet predicted outcomes to demonstrate improved functional mobility.    Time 6    Period Weeks    Status On-going      PT LONG TERM GOAL #3   Title Patient will report at least 50% improvement in overall symptoms and/or function to demonstrate improved functional mobility    Baseline 40% better    Time 6    Period Weeks    Status On-going                 Plan - 02/14/20 1613    Clinical Impression Statement Patient has made great progress since start of PT. she has met all short term goals and is working towards long term goals. Focus was on HEP review and answering all questions prior to end of session. Patient to discharge from PT at  this  time to HEP.    Examination-Activity Limitations Sleep;Squat;Stairs;Stand;Transfers    Examination-Participation Restrictions Meal Prep;Driving;Community Activity;Cleaning    Stability/Clinical Decision Making Stable/Uncomplicated    Rehab Potential Good    PT Frequency --   8 session over 6 weeks   PT Duration 6 weeks    PT Treatment/Interventions ADLs/Self Care Home Management;Aquatic Therapy;Biofeedback;Cryotherapy;Electrical Stimulation;Iontophoresis 19m/ml Dexamethasone;Moist Heat;Traction;Balance training;Therapeutic exercise;Therapeutic activities;Functional mobility training;Stair training;Gait training;DME Instruction;Neuromuscular re-education;Patient/family education;Manual techniques;Dry needling;Passive range of motion;Joint Manipulations    PT Next Visit Plan DC to HEP    PT Home Exercise Plan 7/21 upper TRA activation: bridge, bent knee raise, clam, LTR           Patient will benefit from skilled therapeutic intervention in order to improve the following deficits and impairments:  Decreased activity tolerance, Decreased balance, Pain, Decreased strength, Decreased range of motion, Decreased mobility, Decreased endurance  Visit Diagnosis: Muscle weakness (generalized)  Chronic midline low back pain without sciatica     Problem List Patient Active Problem List   Diagnosis Date Noted   Need for Tdap vaccination 12/06/2019   Symptomatic varicose veins, bilateral 09/01/2018   Dyspepsia 01/18/2017   Back pain with sciatica 09/20/2014   Solitary lung nodule 09/05/2014   Hyperlipidemia LDL goal <100 01/26/2014   Overweight (BMI 25.0-29.9) 01/26/2014   Allergic rhinitis 09/08/2010   OTH ABNORMAL BRAIN & CNS FUNCTION STUDY 02/16/2010   GOITER, UNSPECIFIED 02/10/2010   Essential hypertension, benign 05/02/2009    4:59 PM, 02/14/20 MJerene Pitch DPT [facility] [phone]  [place] [address]Phone: [phone]  Fax:  [phone] Name: Kelsey LINDOMRN: [id number] of Birth: 806/20/1972

## 2020-02-15 ENCOUNTER — Ambulatory Visit (INDEPENDENT_AMBULATORY_CARE_PROVIDER_SITE_OTHER): Payer: No Typology Code available for payment source | Admitting: Surgery

## 2020-02-15 DIAGNOSIS — M5136 Other intervertebral disc degeneration, lumbar region: Secondary | ICD-10-CM | POA: Diagnosis not present

## 2020-02-15 DIAGNOSIS — M549 Dorsalgia, unspecified: Secondary | ICD-10-CM | POA: Diagnosis not present

## 2020-02-15 DIAGNOSIS — M5430 Sciatica, unspecified side: Secondary | ICD-10-CM

## 2020-02-15 NOTE — Progress Notes (Signed)
49 year old female returns recheck of her low back pain.  She states that she is about 40% better with physical therapy.  She continues to have pain in low back with all activities.  History of to discuss she has GI upset with Voltaren.  Exam Very pleasant black female alert and oriented in no acute distress.   Plan Since patient has failed conservative treatment of this point with physical therapy and since I am not recommending any oral NSAIDs due to history of GI upset I recommend getting a lumbar MRI to rule out HNP/stenosis.  Patient will follow with Dr. Louanne Skye after completion to discuss results and discuss next conservative versus surgical treatment options.  All questions answered.

## 2020-02-20 ENCOUNTER — Encounter (HOSPITAL_COMMUNITY): Payer: No Typology Code available for payment source

## 2020-02-22 ENCOUNTER — Encounter (HOSPITAL_COMMUNITY): Payer: No Typology Code available for payment source

## 2020-02-28 ENCOUNTER — Other Ambulatory Visit: Payer: Self-pay | Admitting: Family Medicine

## 2020-03-05 ENCOUNTER — Other Ambulatory Visit: Payer: Self-pay

## 2020-03-05 ENCOUNTER — Ambulatory Visit
Admission: RE | Admit: 2020-03-05 | Discharge: 2020-03-05 | Disposition: A | Discharge status: Home / Self Care | Payer: No Typology Code available for payment source | Source: Ambulatory Visit | Attending: Surgery | Admitting: Surgery

## 2020-03-05 DIAGNOSIS — M549 Dorsalgia, unspecified: Secondary | ICD-10-CM

## 2020-03-05 DIAGNOSIS — M5136 Other intervertebral disc degeneration, lumbar region: Secondary | ICD-10-CM

## 2020-03-05 DIAGNOSIS — M5430 Sciatica, unspecified side: Secondary | ICD-10-CM

## 2020-03-18 ENCOUNTER — Other Ambulatory Visit: Payer: Self-pay

## 2020-03-18 ENCOUNTER — Other Ambulatory Visit: Payer: Self-pay | Admitting: Specialist

## 2020-03-18 ENCOUNTER — Ambulatory Visit (INDEPENDENT_AMBULATORY_CARE_PROVIDER_SITE_OTHER): Payer: No Typology Code available for payment source | Admitting: Specialist

## 2020-03-18 ENCOUNTER — Encounter: Payer: Self-pay | Admitting: Specialist

## 2020-03-18 VITALS — BP 102/70 | HR 72 | Ht 71.0 in | Wt 195.0 lb

## 2020-03-18 DIAGNOSIS — M5136 Other intervertebral disc degeneration, lumbar region: Secondary | ICD-10-CM

## 2020-03-18 DIAGNOSIS — M5116 Intervertebral disc disorders with radiculopathy, lumbar region: Secondary | ICD-10-CM

## 2020-03-18 DIAGNOSIS — M5430 Sciatica, unspecified side: Secondary | ICD-10-CM

## 2020-03-18 DIAGNOSIS — M549 Dorsalgia, unspecified: Secondary | ICD-10-CM | POA: Diagnosis not present

## 2020-03-18 MED ORDER — DICLOFENAC-MISOPROSTOL 50-0.2 MG PO TBEC
1.0000 | DELAYED_RELEASE_TABLET | Freq: Two times a day (BID) | ORAL | 3 refills | Status: DC
Start: 2020-03-18 — End: 2020-03-18

## 2020-03-18 MED FILL — DICLOFENAC-MISOPROST 50-0.2: 50-0.2 | 30 days supply | Qty: 60 | Fill #0

## 2020-03-18 NOTE — Progress Notes (Signed)
Office Visit Note   Patient: [name]           Date of Birth: 1970-11-15           MRN: [id number] Visit Date: 03/18/2020              Requested by: Fayrene Helper, [address], [place],  [place] [id number] PCP: Fayrene Helper, MD   Assessment & Plan: Visit Diagnoses:  1. Degenerative disc disease, lumbar   2. Back pain with sciatica   3. Lumbar disc herniation with radiculopathy     Plan:Avoid bending, stooping and avoid lifting weights greater than 10 lbs. Avoid prolong standing and walking. Avoid frequent bending and stooping  No lifting greater than 10 lbs. May use ice or moist heat for pain. Weight loss is of benefit. Meds like motrin, naprosyn and diclofenac can be of benefit in relieving pain due to degenerative changes in the disc.  Handicap license is approved. Due to persistent ongoing pain in the left anterior thigh I recommend an Epidural Steroid Injection.  Dr. Romona Curls secretary/Assistant will call to arrange for epidural steroid injection     Follow-Up Instructions: No follow-ups on file.   Orders:  No orders of the defined types were placed in this encounter.  No orders of the defined types were placed in this encounter.     Procedures: No procedures performed   Clinical Data: Findings:  Narrative & Impression CLINICAL DATA:  Lumbar radiculopathy. Worsening low back pain. No prior back surgery  EXAM: MRI LUMBAR SPINE WITHOUT CONTRAST  TECHNIQUE: Multiplanar, multisequence MR imaging of the lumbar spine was performed. No intravenous contrast was administered.  COMPARISON:  MRI lumbar spine 06/19/2005. Lumbar spine radiographs 12/14/2019  FINDINGS: Segmentation:  Normal  Alignment:  Normal  Vertebrae:  Negative for fracture or mass.  Conus medullaris and cauda equina: Conus extends to the L1-2 level. Conus and cauda equina appear normal.  Paraspinal and other soft tissues: 17 x 20 mm cyst  in the right pelvis medial to the iliac vessels. This is not seen previously. Otherwise no mass or adenopathy.  Disc levels:  T12-L1: Negative  L1-2: Negative  L2-3: Negative  L3-4: Progressive disc degeneration with disc space narrowing and Schmorl's node. Disc degeneration is asymmetric to the right with sclerotic bone marrow changes. Small left paracentral disc protrusion has developed in the interval. Mild facet hypertrophy. Mild displacement left L4 nerve root in the subarticular zone without nerve root compression. Spinal canal adequate in size.  L4-5: Progressive disc degeneration with disc space narrowing and endplate sclerotic changes. No significant spinal or foraminal stenosis  L5-S1: Mild disc degeneration with mild disc bulging. Negative for stenosis.  IMPRESSION: 1. Progressive disc degeneration L3-4. Small left paracentral disc protrusion with mild displacement left L4 nerve root. No nerve root compression or significant stenosis 2. Progressive disc degeneration L4-5 without significant stenosis 3. 17 x 20 mm cyst in the right pelvis medial to the iliac vessels. Assuming the patient has no malignancy, probable benign finding.   Electronically Signed   By: Franchot Gallo M.D.   On: 03/06/2020 08:31  Result History  MR Lumbar Spine w/o contrast (Order #[id number]) on 03/06/2020 - Order Result History Report     Subjective: Chief Complaint  Patient presents with  . Lower Back - Follow-up    MRI Review Lumbar without contrast    HPI  Review of Systems   Objective: Vital Signs: BP 102/70 (BP Location:  Left Arm, Patient Position: Sitting)   Pulse 72   Ht 5\' 11"  (1.803 m)   Wt 195 lb (88.5 kg)   BMI 27.20 kg/m   Physical Exam  Ortho Exam  Specialty Comments:  No specialty comments available.  Imaging: No results found.   PMFS History: Patient Active Problem List   Diagnosis Date Noted  . Need for Tdap vaccination  12/06/2019  . Symptomatic varicose veins, bilateral 09/01/2018  . Dyspepsia 01/18/2017  . Back pain with sciatica 09/20/2014  . Solitary lung nodule 09/05/2014  . Hyperlipidemia LDL goal <100 01/26/2014  . Overweight (BMI 25.0-29.9) 01/26/2014  . Allergic rhinitis 09/08/2010  . OTH ABNORMAL BRAIN & CNS FUNCTION STUDY 02/16/2010  . GOITER, UNSPECIFIED 02/10/2010  . Essential hypertension, benign 05/02/2009   Past Medical History:  Diagnosis Date  . Essential hypertension, benign   . Migraine headache 2007  . Obesity    Since childhood   . Pneumonia 2007  . Spinal disease   . Supraventricular tachycardia [facility])    Status post RFA July 2015 - Dr. Lovena Le  . Type 2 diabetes mellitus ([place])   . Weight loss    Hopitalized 4 years ago after loosing 200lb on a supervised program, malnurished with severe protein deficiency     Family History  Problem Relation Age of Onset  . Hypertension Mother   . Diabetes Mother   . Cancer Mother        Uterine   . Heart failure Mother        CABG  . Cancer Father 2       Prostate   . Hypertension Father   . Bradycardia Father        Irregular heart beat   . Diabetes Brother   . Diabetes Brother   . Hypertension Brother   . Hypertension Brother     Past Surgical History:  Procedure Laterality Date  . ABLATION  12-28-2013   slow pathway modification of AVNRT by Dr Lovena Le  . COLONOSCOPY N/A 11/17/2019   Procedure: COLONOSCOPY;  Surgeon: Daneil Dolin, MD;  [facility];  Service: Endoscopy;  Laterality: N/A;  7:30  . ELECTROPHYSIOLOGY STUDY N/A 12/28/2013   Procedure: ELECTROPHYSIOLOGY STUDY;  Surgeon: Evans Lance, MD;  Location: [facility] CATH LAB;  Service: Cardiovascular;  Laterality: N/A;  . SUPRAVENTRICULAR TACHYCARDIA ABLATION N/A 12/28/2013   Procedure: SUPRAVENTRICULAR TACHYCARDIA ABLATION;  Surgeon: Evans Lance, MD;  Location: [facility] CATH LAB;  Service: Cardiovascular;  Laterality: N/A;  . TUBAL LIGATION  1996   Social History     Occupational History  . Occupation: English as a second language teacher at [employer]  . Smoking status: Former Smoker    Packs/day: 0.50    Years: 7.00    Pack years: 3.50    Types: Cigarettes    Quit date: 08/05/2012    Years since quitting: 7.6  . Smokeless tobacco: Never Used  . Tobacco comment: quit in 2014  Vaping Use  . Vaping Use: Never used  Substance and Sexual Activity  . Alcohol use: No    Alcohol/week: 0.0 standard drinks  . Drug use: No  . Sexual activity: Not on file

## 2020-03-18 NOTE — Patient Instructions (Addendum)
Avoid bending, stooping and avoid lifting weights greater than 10 lbs. Avoid prolong standing and walking. Avoid frequent bending and stooping  No lifting greater than 10 lbs. May use ice or moist heat for pain. Weight loss is of benefit. Meds like motrin, naprosyn and diclofenac can be of benefit in relieving pain due to degenerative changes in the disc.  Handicap license is approved. Due to persistent ongoing pain in the left anterior thigh I recommend an Epidural Steroid Injection.  Dr. Romona Curls secretary/Assistant will call to arrange for epidural steroid injection

## 2020-03-19 ENCOUNTER — Encounter: Payer: Self-pay | Admitting: Family Medicine

## 2020-03-19 MED FILL — CYCLOBENZAPRINE HCL 10 MG T: 10 | 90 days supply | Qty: 90 | Fill #0

## 2020-03-20 ENCOUNTER — Encounter: Payer: No Typology Code available for payment source | Admitting: Family Medicine

## 2020-04-01 ENCOUNTER — Ambulatory Visit (INDEPENDENT_AMBULATORY_CARE_PROVIDER_SITE_OTHER): Payer: No Typology Code available for payment source | Admitting: Family Medicine

## 2020-04-01 ENCOUNTER — Other Ambulatory Visit (HOSPITAL_COMMUNITY)
Admission: RE | Admit: 2020-04-01 | Discharge: 2020-04-01 | Disposition: A | Discharge status: Home / Self Care | Payer: No Typology Code available for payment source | Source: Ambulatory Visit | Attending: Family Medicine | Admitting: Family Medicine

## 2020-04-01 ENCOUNTER — Other Ambulatory Visit: Payer: Self-pay

## 2020-04-01 ENCOUNTER — Encounter: Payer: Self-pay | Admitting: Family Medicine

## 2020-04-01 VITALS — BP 112/69 | HR 85 | Temp 97.6°F | Resp 20 | Ht 71.0 in | Wt 210.0 lb

## 2020-04-01 DIAGNOSIS — R935 Abnormal findings on diagnostic imaging of other abdominal regions, including retroperitoneum: Secondary | ICD-10-CM | POA: Insufficient documentation

## 2020-04-01 DIAGNOSIS — Z124 Encounter for screening for malignant neoplasm of cervix: Secondary | ICD-10-CM | POA: Insufficient documentation

## 2020-04-01 DIAGNOSIS — E663 Overweight: Secondary | ICD-10-CM

## 2020-04-01 DIAGNOSIS — I10 Essential (primary) hypertension: Secondary | ICD-10-CM

## 2020-04-01 DIAGNOSIS — Z23 Encounter for immunization: Secondary | ICD-10-CM | POA: Diagnosis not present

## 2020-04-01 DIAGNOSIS — Z0000 Encounter for general adult medical examination without abnormal findings: Secondary | ICD-10-CM | POA: Diagnosis not present

## 2020-04-01 DIAGNOSIS — E785 Hyperlipidemia, unspecified: Secondary | ICD-10-CM

## 2020-04-01 NOTE — Progress Notes (Signed)
[name]     MRN: [id number]      DOB: [birth year]  HPI: Patient is in for annual physical exam. C/o weight gain despite limiting calories, unable to exercise due to back pain C/o abnormal T scan, pelvic cyst/ mass, gyne to eval. Recent labs, if available are reviewed. Immunization is reviewed , and  updated    PE: BP 112/69   Pulse 85   Temp 97.6 F (36.4 C)   Resp 20   Ht 5\' 11"  (1.803 m)   Wt 210 lb (95.3 kg)   SpO2 97%   BMI 29.29 kg/m  Pleasant  female, alert and oriented x 3, in no cardio-pulmonary distress. Afebrile. HEENT No facial trauma or asymetry. Sinuses non tender.  Extra occullar muscles intact.. External ears normal, . Neck: supple, no adenopathy,JVD or thyromegaly.No bruits.  Chest: Clear to ascultation bilaterally.No crackles or wheezes. Non tender to palpation  Breast: No asymetry,no masses or lumps. No tenderness. No nipple discharge or inversion. No axillary or supraclavicular adenopathy  Cardiovascular system; Heart sounds normal,  S1 and  S2 ,no S3.  No murmur, or thrill. Apical beat not displaced Peripheral pulses normal.  Abdomen: Soft, non tender, no organomegaly or masses. No bruits. Bowel sounds normal. No guarding, tenderness or rebound.   GU: External genitalia normal female genitalia , normal female distribution of hair. No lesions. Urethral meatus normal in size, no  Prolapse, no lesions visibly  Present. Bladder non tender. Vagina pink and moist , with no visible lesions , discharge present . Adequate pelvic support no  cystocele or rectocele noted Cervix pink and appears healthy, no lesions or ulcerations noted, no discharge noted from os Uterus normal size, no adnexal masses, no cervical motion or adnexal tenderness.   Musculoskeletal exam: Decreased  ROM of lumbar  spine, adequate in hips , shoulders and knees. No deformity ,swelling or crepitus noted. No muscle wasting or atrophy.   Neurologic: Cranial  nerves 2 to 12 intact. Power, tone ,sensation and reflexes normal throughout. No disturbance in gait. No tremor.  Skin: Intact, no ulceration, erythema , scaling or rash noted. Pigmentation normal throughout  Psych; Normal mood and affect. Judgement and concentration normal   Assessment & Plan:  Annual physical exam Annual exam as documented. Counseling done  re healthy lifestyle involving commitment to 150 minutes exercise per week, heart healthy diet, and attaining healthy weight.The importance of adequate sleep also discussed. Regular seat belt use and home safety, is also discussed. Changes in health habits are decided on by the patient with goals and time frames  set for achieving them. Immunization and cancer screening needs are specifically addressed at this visit.   Abnormal CT scan, pelvis 20 mm cyst noted on recent scan, previous US suggested possible fibroid, mother diagnosed with muttering cancer in her early 72's  Overweight (BMI 25.0-29.9)  Patient re-educated about  the importance of commitment to a  minimum of 150 minutes of exercise per week as able.  The importance of healthy food choices with portion control discussed, as well as eating regularly and within a 12 hour window most days. The need to choose "clean , green" food 50 to 75% of the time is discussed, as well as to make water the primary drink and set a goal of 64 ounces water daily.    Weight /BMI 04/01/2020 03/18/2020 01/11/2020  WEIGHT 210 lb 195 lb 195 lb  HEIGHT 5\' 11"  5\' 11"  5\' 11"   BMI 29.29 kg/m2 27.2  kg/m2 27.2 kg/m2  Some encounter information is confidential and restricted. Go to Review Flowsheets activity to see all data.  deteriorated despite calorie count, nutrition referral

## 2020-04-01 NOTE — Assessment & Plan Note (Signed)
20 mm cyst noted on recent scan, previous US suggested possible fibroid, mother diagnosed with muttering cancer in her early 1's

## 2020-04-01 NOTE — Patient Instructions (Addendum)
F/u in office with MD early  February, call  If you need me sooner. Zostrix #2 at visit, and re evaluate weight  Zostrix #1 today  Nurse please document Moderna #3  Vaccine administered on 02/14/2020  Fasting lipid and chem 7 and EGFR 5 days befoire February follow up  You are referred to Gynecology re pelvic cyst  No medication changes  You are referred to Nutrition / dietician re recent weight gain, despite limiting calories  I recommend looking into water aerobics as a means of exercise  Pap sent today It is important that you exercise regularly at least 30 minutes 5 times a week. If you develop chest pain, have severe difficulty breathing, or feel very tired, stop exercising immediately and seek medical attention  Think about what you will eat, plan ahead. Choose " clean, green, fresh or frozen" over canned, processed or packaged foods which are more sugary, salty and fatty. 70 to 75% of food eaten should be vegetables and fruit. Three meals at set times with snacks allowed between meals, but they must be fruit or vegetables. Aim to eat over a 12 hour period , example 7 am to 7 pm, and STOP after  your last meal of the day. Drink water,generally about 64 ounces per day, no other drink is as healthy. Fruit juice is best enjoyed in a healthy way, by EATING the fruit.    Thanks for choosing [facility], we consider it a privelige to serve you.

## 2020-04-01 NOTE — Assessment & Plan Note (Signed)

## 2020-04-02 ENCOUNTER — Encounter: Payer: Self-pay | Admitting: Family Medicine

## 2020-04-02 ENCOUNTER — Other Ambulatory Visit: Payer: Self-pay | Admitting: *Deleted

## 2020-04-02 DIAGNOSIS — I10 Essential (primary) hypertension: Secondary | ICD-10-CM

## 2020-04-02 NOTE — Assessment & Plan Note (Signed)
  Patient re-educated about  the importance of commitment to a  minimum of 150 minutes of exercise per week as able.  The importance of healthy food choices with portion control discussed, as well as eating regularly and within a 12 hour window most days. The need to choose "clean , green" food 50 to 75% of the time is discussed, as well as to make water the primary drink and set a goal of 64 ounces water daily.    Weight /BMI 04/01/2020 03/18/2020 01/11/2020  WEIGHT 210 lb 195 lb 195 lb  HEIGHT 5\' 11"  5\' 11"  5\' 11"   BMI 29.29 kg/m2 27.2 kg/m2 27.2 kg/m2  Some encounter information is confidential and restricted. Go to Review Flowsheets activity to see all data.  deteriorated despite calorie count, nutrition referral

## 2020-04-03 LAB — CYTOLOGY - PAP
Comment: NEGATIVE
Diagnosis: NEGATIVE
High risk HPV: NEGATIVE

## 2020-04-04 ENCOUNTER — Encounter: Payer: Self-pay | Admitting: Obstetrics & Gynecology

## 2020-04-04 ENCOUNTER — Ambulatory Visit: Payer: No Typology Code available for payment source | Admitting: Obstetrics & Gynecology

## 2020-04-04 VITALS — BP 109/71 | HR 81 | Wt 210.0 lb

## 2020-04-04 DIAGNOSIS — N83299 Other ovarian cyst, unspecified side: Secondary | ICD-10-CM | POA: Diagnosis not present

## 2020-04-04 MED FILL — LOSARTAN-HCTZ 50-12.5 MG TA: 50-12.5 | 90 days supply | Qty: 90 | Fill #1

## 2020-04-04 NOTE — Progress Notes (Signed)
Follow up appointment for results  Chief Complaint  Patient presents with  . Cyst in pelvis    Blood pressure 109/71, pulse 81, weight 210 lb (95.3 kg), last menstrual period 03/14/2020.    Study Result  Narrative & Impression  CLINICAL DATA:  Lumbar radiculopathy. Worsening low back pain. No prior back surgery  EXAM: MRI LUMBAR SPINE WITHOUT CONTRAST  TECHNIQUE: Multiplanar, multisequence MR imaging of the lumbar spine was performed. No intravenous contrast was administered.  COMPARISON:  MRI lumbar spine 06/19/2005. Lumbar spine radiographs 12/14/2019  FINDINGS: Segmentation:  Normal  Alignment:  Normal  Vertebrae:  Negative for fracture or mass.  Conus medullaris and cauda equina: Conus extends to the L1-2 level. Conus and cauda equina appear normal.  Paraspinal and other soft tissues: 17 x 20 mm cyst in the right pelvis medial to the iliac vessels. This is not seen previously. Otherwise no mass or adenopathy.  Disc levels:  T12-L1: Negative  L1-2: Negative  L2-3: Negative  L3-4: Progressive disc degeneration with disc space narrowing and Schmorl's node. Disc degeneration is asymmetric to the right with sclerotic bone marrow changes. Small left paracentral disc protrusion has developed in the interval. Mild facet hypertrophy. Mild displacement left L4 nerve root in the subarticular zone without nerve root compression. Spinal canal adequate in size.  L4-5: Progressive disc degeneration with disc space narrowing and endplate sclerotic changes. No significant spinal or foraminal stenosis  L5-S1: Mild disc degeneration with mild disc bulging. Negative for stenosis.  IMPRESSION: 1. Progressive disc degeneration L3-4. Small left paracentral disc protrusion with mild displacement left L4 nerve root. No nerve root compression or significant stenosis 2. Progressive disc degeneration L4-5 without significant stenosis 3. 17 x 20 mm cyst in  the right pelvis medial to the iliac vessels. Assuming the patient has no malignancy, probable benign finding.   Electronically Signed   By: Franchot Gallo M.D.   On: 03/06/2020 08:31     MEDS ordered this encounter: No orders of the defined types were placed in this encounter.   Orders for this encounter: No orders of the defined types were placed in this encounter.   Impression:   ICD-10-CM   1. Physiological ovarian cysts, right ovary, incidental finding on recent MRI  N83.299      Plan: No follow up needed, physiologic cyst in a menstruating woman  Follow Up: No follow-ups on file.       Face to face time:  10 minutes  Greater than 50% of the visit time was spent in counseling and coordination of care with the patient.  The summary and outline of the counseling and care coordination is summarized in the note above.   All questions were answered.  Past Medical History:  Diagnosis Date  . Arthritis    Phreesia 03/29/2020  . Diabetes mellitus without complication ([place])    Phreesia 03/29/2020  . Essential hypertension, benign   . Hypertension    Phreesia 03/29/2020  . Migraine headache 2007  . Obesity    Since childhood   . Pneumonia 2007  . Spinal disease   . Supraventricular tachycardia [facility])    Status post RFA July 2015 - Dr. Lovena Le  . Type 2 diabetes mellitus ([place])   . Weight loss    Hopitalized 4 years ago after loosing 200lb on a supervised program, malnurished with severe protein deficiency     Past Surgical History:  Procedure Laterality Date  . ABLATION  12-28-2013   slow pathway modification of AVNRT  by Dr Lovena Le  . COLONOSCOPY N/A 11/17/2019   Procedure: COLONOSCOPY;  Surgeon: Daneil Dolin, MD;  [facility];  Service: Endoscopy;  Laterality: N/A;  7:30  . ELECTROPHYSIOLOGY STUDY N/A 12/28/2013   Procedure: ELECTROPHYSIOLOGY STUDY;  Surgeon: Evans Lance, MD;  Location: [facility] CATH LAB;  Service: Cardiovascular;  Laterality:  N/A;  . SUPRAVENTRICULAR TACHYCARDIA ABLATION N/A 12/28/2013   Procedure: SUPRAVENTRICULAR TACHYCARDIA ABLATION;  Surgeon: Evans Lance, MD;  Location: [facility] CATH LAB;  Service: Cardiovascular;  Laterality: N/A;  . TUBAL LIGATION  1996    OB History    Gravida  2   Para      Term      Preterm      AB      Living        SAB      TAB      Ectopic      Multiple      Live Births              Allergies  Allergen Reactions  . Other   . Codeine Nausea And Vomiting  . Metoclopramide Hcl Other (See Comments)    Not in right state of mind    Social History   Socioeconomic History  . Marital status: Married    Spouse name: Not on file  . Number of children: 2  . Years of education: Not on file  . Highest education level: Not on file  Occupational History  . Occupation: English as a second language teacher at [employer]  . Smoking status: Former Smoker    Packs/day: 0.50    Years: 7.00    Pack years: 3.50    Types: Cigarettes    Quit date: 08/05/2012    Years since quitting: 7.6  . Smokeless tobacco: Never Used  . Tobacco comment: quit in 2014  Vaping Use  . Vaping Use: Never used  Substance and Sexual Activity  . Alcohol use: No    Alcohol/week: 0.0 standard drinks  . Drug use: No  . Sexual activity: Yes    Birth control/protection: None, Surgical    Comment: tubal ligation  Other Topics Concern  . Not on file  Social History Narrative  . Not on file   Social Determinants of Health   Financial Resource Strain: Low Risk   . Difficulty of Paying Living Expenses: Not hard at all  Food Insecurity: No Food Insecurity  . Worried About Charity fundraiser in the Last Year: Never true  . Ran Out of Food in the Last Year: Never true  Transportation Needs: No Transportation Needs  . Lack of Transportation (Medical): No  . Lack of Transportation (Non-Medical): No  Physical Activity: Inactive  . Days of Exercise per Week: 0 days  . Minutes of Exercise per Session: 0 min   Stress: No Stress Concern Present  . Feeling of Stress : Only a little  Social Connections: Moderately Integrated  . Frequency of Communication with Friends and Family: More than three times a week  . Frequency of Social Gatherings with Friends and Family: Once a week  . Attends Religious Services: 1 to 4 times per year  . Active Member of Clubs or Organizations: No  . Attends Archivist Meetings: Never  . Marital Status: Married    Family History  Problem Relation Age of Onset  . Hypertension Mother   . Diabetes Mother   . Cancer Mother  Uterine   . Heart failure Mother        CABG  . Cancer Father 79       Prostate   . Hypertension Father   . Bradycardia Father        Irregular heart beat   . Diabetes Brother   . Diabetes Brother   . Hypertension Brother   . Hypertension Brother

## 2020-04-08 ENCOUNTER — Other Ambulatory Visit: Payer: Self-pay

## 2020-04-08 DIAGNOSIS — R635 Abnormal weight gain: Secondary | ICD-10-CM

## 2020-04-08 DIAGNOSIS — E663 Overweight: Secondary | ICD-10-CM

## 2020-04-09 ENCOUNTER — Ambulatory Visit: Payer: No Typology Code available for payment source | Admitting: Physical Medicine and Rehabilitation

## 2020-04-10 ENCOUNTER — Encounter: Payer: Self-pay | Admitting: Physical Medicine and Rehabilitation

## 2020-04-10 ENCOUNTER — Ambulatory Visit: Payer: Self-pay

## 2020-04-10 ENCOUNTER — Other Ambulatory Visit: Payer: Self-pay

## 2020-04-10 ENCOUNTER — Ambulatory Visit (INDEPENDENT_AMBULATORY_CARE_PROVIDER_SITE_OTHER): Payer: No Typology Code available for payment source | Admitting: Physical Medicine and Rehabilitation

## 2020-04-10 VITALS — BP 107/73 | HR 90

## 2020-04-10 DIAGNOSIS — M5416 Radiculopathy, lumbar region: Secondary | ICD-10-CM

## 2020-04-10 MED ORDER — METHYLPREDNISOLONE ACETATE 80 MG/ML IJ SUSP
80.0000 mg | Freq: Once | INTRAMUSCULAR | Status: AC
  Administered 2020-04-10: 80 mg

## 2020-04-10 NOTE — Progress Notes (Signed)
Pt state lower back pain that travels to her left thigh down her left leg. Pt state walking and standing on her feet a long time makes the worse. Pt state she takes pain med and heating pads to help ease the pain.  Numeric Pain Rating Scale and Functional Assessment Average Pain 4   In the last MONTH (on 0-10 scale) has pain interfered with the following?  1. General activity like being  able to carry out your everyday physical activities such as walking, climbing stairs, carrying groceries, or moving a chair?  Rating(9)   +Driver, -BT, -Dye Allergies.

## 2020-04-15 ENCOUNTER — Other Ambulatory Visit: Payer: Self-pay | Admitting: *Deleted

## 2020-04-15 ENCOUNTER — Encounter: Payer: Self-pay | Admitting: Family Medicine

## 2020-04-15 MED ORDER — FAMOTIDINE 40 MG PO TABS: ORAL_TABLET | ORAL | 0 refills | Status: DC

## 2020-04-15 MED FILL — FAMOTIDINE 40 MG TABS: 40 | 30 days supply | Qty: 30 | Fill #0

## 2020-04-25 ENCOUNTER — Other Ambulatory Visit: Payer: Self-pay

## 2020-04-25 ENCOUNTER — Encounter: Payer: Self-pay | Admitting: Specialist

## 2020-04-25 ENCOUNTER — Ambulatory Visit (INDEPENDENT_AMBULATORY_CARE_PROVIDER_SITE_OTHER): Payer: No Typology Code available for payment source | Admitting: Specialist

## 2020-04-25 VITALS — BP 114/77 | HR 72 | Ht 71.0 in | Wt 210.0 lb

## 2020-04-25 DIAGNOSIS — M5136 Other intervertebral disc degeneration, lumbar region: Secondary | ICD-10-CM | POA: Diagnosis not present

## 2020-04-25 DIAGNOSIS — M549 Dorsalgia, unspecified: Secondary | ICD-10-CM | POA: Diagnosis not present

## 2020-04-25 DIAGNOSIS — M533 Sacrococcygeal disorders, not elsewhere classified: Secondary | ICD-10-CM

## 2020-04-25 DIAGNOSIS — M5430 Sciatica, unspecified side: Secondary | ICD-10-CM

## 2020-04-25 DIAGNOSIS — M5116 Intervertebral disc disorders with radiculopathy, lumbar region: Secondary | ICD-10-CM

## 2020-04-25 NOTE — Patient Instructions (Signed)
Avoid bending, stooping and avoid lifting weights greater than 10-15 lbs. Avoid prolong standing and walking. Avoid frequent bending and stooping  No lifting greater than 10-15 lbs. May use ice or moist heat for pain. Weight loss is of benefit. Handicap license is approved. Call us if the pain recurrs and leave a message with Alyse Low and we would the  Place the order and have Dr. Romona Curls secretary/Assistant call to arrange for epidural steroid injection.

## 2020-04-25 NOTE — Progress Notes (Signed)
Office Visit Note   Patient: [name]           Date of Birth: January 09, 1971           MRN: [id number] Visit Date: 04/25/2020              Requested by: Fayrene Helper, [address], [place],  [place] [address] PCP: Fayrene Helper, MD   Assessment & Plan: Visit Diagnoses:  1. Back pain with sciatica   2. Lumbar disc herniation with radiculopathy   3. Sacroiliac joint disease   4. Degenerative disc disease, lumbar     Plan: Avoid bending, stooping and avoid lifting weights greater than 10-15 lbs. Avoid prolong standing and walking. Avoid frequent bending and stooping  No lifting greater than 10-15 lbs. May use ice or moist heat for pain. Weight loss is of benefit. Handicap license is approved. Call us if the pain recurrs and leave a message with Alyse Low and we would the  Place the order and have Dr. Romona Curls secretary/Assistant call to arrange for epidural steroid injection.  Follow-Up Instructions: No follow-ups on file.   Orders:  No orders of the defined types were placed in this encounter.  No orders of the defined types were placed in this encounter.     Procedures: No procedures performed   Clinical Data: No additional findings.   Subjective: Chief Complaint  Patient presents with  . Lower Back - Follow-up    She had Left L3-4 IL injection with Dr. Ernestina Patches she states that she was horrible for the first 2 days and after that she got 85-90% relief and she is going great, no radicular pain now    49 year old female with history of left L3-4 lateral recess stenosis had ESI left L3-4 with good relief of pain as much as 85-90%. She is still working through all of this. No bowel or bladder difficulties.    Review of Systems  Constitutional: Negative.   HENT: Negative.   Eyes: Negative.   Respiratory: Negative.   Cardiovascular: Negative.   Gastrointestinal: Negative.   Endocrine: Negative.   Genitourinary: Negative.     Musculoskeletal: Negative.   Skin: Negative.   Allergic/Immunologic: Negative.   Neurological: Negative.   Hematological: Negative.   Psychiatric/Behavioral: Negative.      Objective: Vital Signs: BP 114/77 (BP Location: Left Arm, Patient Position: Sitting)   Pulse 72   Ht 5\' 11"  (1.803 m)   Wt 210 lb (95.3 kg)   BMI 29.29 kg/m   Physical Exam Constitutional:      Appearance: She is well-developed.  HENT:     Head: Normocephalic and atraumatic.  Eyes:     Pupils: Pupils are equal, round, and reactive to light.  Pulmonary:     Effort: Pulmonary effort is normal.     Breath sounds: Normal breath sounds.  Abdominal:     General: Bowel sounds are normal.     Palpations: Abdomen is soft.  Musculoskeletal:     Cervical back: Normal range of motion and neck supple.     Lumbar back: Negative right straight leg raise test and negative left straight leg raise test.  Skin:    General: Skin is warm and dry.  Neurological:     Mental Status: She is alert and oriented to person, place, and time.  Psychiatric:        Behavior: Behavior normal.        Thought Content: Thought content  normal.        Judgment: Judgment normal.     Back Exam   Tenderness  The patient is experiencing tenderness in the lumbar.  Range of Motion  Extension: abnormal  Flexion: abnormal  Lateral bend right: abnormal  Lateral bend left: abnormal  Rotation right: abnormal  Rotation left: abnormal   Muscle Strength  Right Quadriceps:  5/5  Left Quadriceps:  5/5  Right Hamstrings:  5/5  Left Hamstrings:  5/5   Tests  Straight leg raise right: negative Straight leg raise left: negative  Other  Toe walk: normal Heel walk: normal Erythema: no back redness Scars: absent  Comments:  SLR is negative both sides. Trace weakness left hip flexor. Rest is normal.      Specialty Comments:  No specialty comments available.  Imaging: No results found.   PMFS History: Patient Active  Problem List   Diagnosis Date Noted  . Abnormal CT scan, pelvis 04/01/2020  . Need for Tdap vaccination 12/06/2019  . Symptomatic varicose veins, bilateral 09/01/2018  . Dyspepsia 01/18/2017  . Back pain with sciatica 09/20/2014  . Solitary lung nodule 09/05/2014  . Hyperlipidemia LDL goal <100 01/26/2014  . Overweight (BMI 25.0-29.9) 01/26/2014  . Annual physical exam 04/18/2012  . Allergic rhinitis 09/08/2010  . OTH ABNORMAL BRAIN & CNS FUNCTION STUDY 02/16/2010  . GOITER, UNSPECIFIED 02/10/2010  . Essential hypertension, benign 05/02/2009   Past Medical History:  Diagnosis Date  . Arthritis    Phreesia 03/29/2020  . Diabetes mellitus without complication ([place])    Phreesia 03/29/2020  . Essential hypertension, benign   . Hypertension    Phreesia 03/29/2020  . Migraine headache 2007  . Obesity    Since childhood   . Pneumonia 2007  . Spinal disease   . Supraventricular tachycardia [facility])    Status post RFA July 2015 - Dr. Lovena Le  . Type 2 diabetes mellitus ([place])   . Weight loss    Hopitalized 4 years ago after loosing 200lb on a supervised program, malnurished with severe protein deficiency     Family History  Problem Relation Age of Onset  . Hypertension Mother   . Diabetes Mother   . Cancer Mother        Uterine   . Heart failure Mother        CABG  . Cancer Father 66       Prostate   . Hypertension Father   . Bradycardia Father        Irregular heart beat   . Diabetes Brother   . Diabetes Brother   . Hypertension Brother   . Hypertension Brother     Past Surgical History:  Procedure Laterality Date  . ABLATION  12-28-2013   slow pathway modification of AVNRT by Dr Lovena Le  . COLONOSCOPY N/A 11/17/2019   Procedure: COLONOSCOPY;  Surgeon: Daneil Dolin, MD;  [facility];  Service: Endoscopy;  Laterality: N/A;  7:30  . ELECTROPHYSIOLOGY STUDY N/A 12/28/2013   Procedure: ELECTROPHYSIOLOGY STUDY;  Surgeon: Evans Lance, MD;  Location: [facility] CATH LAB;   Service: Cardiovascular;  Laterality: N/A;  . SUPRAVENTRICULAR TACHYCARDIA ABLATION N/A 12/28/2013   Procedure: SUPRAVENTRICULAR TACHYCARDIA ABLATION;  Surgeon: Evans Lance, MD;  Location: [facility] CATH LAB;  Service: Cardiovascular;  Laterality: N/A;  . TUBAL LIGATION  1996   Social History   Occupational History  . Occupation: English as a second language teacher at [employer]  . Smoking status: Former Smoker    Packs/day: 0.50  Years: 7.00    Pack years: 3.50    Types: Cigarettes    Quit date: 08/05/2012    Years since quitting: 7.7  . Smokeless tobacco: Never Used  . Tobacco comment: quit in 2014  Vaping Use  . Vaping Use: Never used  Substance and Sexual Activity  . Alcohol use: No    Alcohol/week: 0.0 standard drinks  . Drug use: No  . Sexual activity: Yes    Birth control/protection: None, Surgical    Comment: tubal ligation

## 2020-04-29 NOTE — Procedures (Signed)
Lumbar Epidural Steroid Injection - Interlaminar Approach with Fluoroscopic Guidance  Patient: [name]      Date of Birth: 1970-09-14 MRN: [id number] PCP: Fayrene Helper, MD      Visit Date: 04/10/2020   Universal Protocol:     Consent Given By: the patient  Position: PRONE  Additional Comments: Vital signs were monitored before and after the procedure. Patient was prepped and draped in the usual sterile fashion. The correct patient, procedure, and site was verified.   Injection Procedure Details:   Procedure diagnoses:  1. Lumbar radiculopathy      Meds Administered:  Meds ordered this encounter  Medications  . methylPREDNISolone acetate (DEPO-MEDROL) injection 80 mg     Laterality: Left  Location/Site:  L3-L4   Needle: 3.5 in., 20 ga. Tuohy  Needle Placement: Paramedian epidural  Findings:   -Comments: Excellent flow of contrast into the epidural space.  Procedure Details: Using a paramedian approach from the side mentioned above, the region overlying the inferior lamina was localized under fluoroscopic visualization and the soft tissues overlying this structure were infiltrated with 4 ml. of 1% Lidocaine without Epinephrine. The Tuohy needle was inserted into the epidural space using a paramedian approach.   The epidural space was localized using loss of resistance along with counter oblique bi-planar fluoroscopic views.  After negative aspirate for air, blood, and CSF, a 2 ml. volume of Isovue-250 was injected into the epidural space and the flow of contrast was observed. Radiographs were obtained for documentation purposes.    The injectate was administered into the level noted above.   Additional Comments:  The patient tolerated the procedure well Dressing: 2 x 2 sterile gauze and Band-Aid    Post-procedure details: Patient was observed during the procedure. Post-procedure instructions were reviewed.  Patient left the clinic in stable  condition.

## 2020-04-30 NOTE — Progress Notes (Signed)
[name] - 49 y.o. female MRN [id number]  Date of birth: 04-11-1971  Office Visit Note: Visit Date: 04/10/2020 PCP: Fayrene Helper, MD Referred by: Fayrene Helper, MD  Subjective: Chief Complaint  Patient presents with  . Lower Back - Pain  . Left Thigh - Pain   HPI:  [name] is a 49 y.o. female who comes in today at the request of Dr. Basil Dess for planned Left L3-L4 Lumbar epidural steroid injection with fluoroscopic guidance.  The patient has failed conservative care including home exercise, medications, time and activity modification.  This injection will be diagnostic and hopefully therapeutic.  Please see requesting physician notes for further details and justification.   ROS Otherwise per HPI.  Assessment & Plan: Visit Diagnoses:  1. Lumbar radiculopathy     Plan: No additional findings.   Meds & Orders:  Meds ordered this encounter  Medications  . methylPREDNISolone acetate (DEPO-MEDROL) injection 80 mg    Orders Placed This Encounter  Procedures  . XR C-ARM NO REPORT  . Epidural Steroid injection    Follow-up: Return for visit to requesting physician as needed.   Procedures: No procedures performed  Lumbar Epidural Steroid Injection - Interlaminar Approach with Fluoroscopic Guidance  Patient: [name]      Date of Birth: [birth year] MRN: [id number] PCP: Fayrene Helper, MD      Visit Date: 04/10/2020   Universal Protocol:     Consent Given By: the patient  Position: PRONE  Additional Comments: Vital signs were monitored before and after the procedure. Patient was prepped and draped in the usual sterile fashion. The correct patient, procedure, and site was verified.   Injection Procedure Details:   Procedure diagnoses:  1. Lumbar radiculopathy      Meds Administered:  Meds ordered this encounter  Medications  . methylPREDNISolone acetate (DEPO-MEDROL) injection 80 mg     Laterality:  Left  Location/Site:  L3-L4   Needle: 3.5 in., 20 ga. Tuohy  Needle Placement: Paramedian epidural  Findings:   -Comments: Excellent flow of contrast into the epidural space.  Procedure Details: Using a paramedian approach from the side mentioned above, the region overlying the inferior lamina was localized under fluoroscopic visualization and the soft tissues overlying this structure were infiltrated with 4 ml. of 1% Lidocaine without Epinephrine. The Tuohy needle was inserted into the epidural space using a paramedian approach.   The epidural space was localized using loss of resistance along with counter oblique bi-planar fluoroscopic views.  After negative aspirate for air, blood, and CSF, a 2 ml. volume of Isovue-250 was injected into the epidural space and the flow of contrast was observed. Radiographs were obtained for documentation purposes.    The injectate was administered into the level noted above.   Additional Comments:  The patient tolerated the procedure well Dressing: 2 x 2 sterile gauze and Band-Aid    Post-procedure details: Patient was observed during the procedure. Post-procedure instructions were reviewed.  Patient left the clinic in stable condition.    Clinical History: Lumbar radiculopathy. Worsening low back pain. No prior back surgery  EXAM: MRI LUMBAR SPINE WITHOUT CONTRAST  TECHNIQUE: Multiplanar, multisequence MR imaging of the lumbar spine was performed. No intravenous contrast was administered.  COMPARISON:  MRI lumbar spine 06/19/2005. Lumbar spine radiographs 12/14/2019  FINDINGS: Segmentation:  Normal  Alignment:  Normal  Vertebrae:  Negative for fracture or mass.  Conus medullaris and cauda equina: Conus extends to the L1-2 level.  Conus and cauda equina appear normal.  Paraspinal and other soft tissues: 17 x 20 mm cyst in the right pelvis medial to the iliac vessels. This is not seen previously. Otherwise no mass or  adenopathy.  Disc levels:  T12-L1: Negative  L1-2: Negative  L2-3: Negative  L3-4: Progressive disc degeneration with disc space narrowing and Schmorl's node. Disc degeneration is asymmetric to the right with sclerotic bone marrow changes. Small left paracentral disc protrusion has developed in the interval. Mild facet hypertrophy. Mild displacement left L4 nerve root in the subarticular zone without nerve root compression. Spinal canal adequate in size.  L4-5: Progressive disc degeneration with disc space narrowing and endplate sclerotic changes. No significant spinal or foraminal stenosis  L5-S1: Mild disc degeneration with mild disc bulging. Negative for stenosis.  IMPRESSION: 1. Progressive disc degeneration L3-4. Small left paracentral disc protrusion with mild displacement left L4 nerve root. No nerve root compression or significant stenosis 2. Progressive disc degeneration L4-5 without significant stenosis 3. 17 x 20 mm cyst in the right pelvis medial to the iliac vessels. Assuming the patient has no malignancy, probable benign finding.   Electronically Signed   By: Franchot Gallo M.D.   On: 03/06/2020 08:31     Objective:  VS:  HT:    WT:   BMI:     BP:107/73  HR:90bpm  TEMP: ( )  RESP:  Physical Exam Constitutional:      General: She is not in acute distress.    Appearance: Normal appearance. She is obese. She is not ill-appearing.  HENT:     Head: Normocephalic and atraumatic.     Right Ear: External ear normal.     Left Ear: External ear normal.  Eyes:     Extraocular Movements: Extraocular movements intact.  Cardiovascular:     Rate and Rhythm: Normal rate.     Pulses: Normal pulses.  Musculoskeletal:     Right lower leg: No edema.     Left lower leg: No edema.     Comments: Patient has good distal strength with no pain over the greater trochanters.  No clonus or focal weakness.  Skin:    Findings: No erythema, lesion or rash.   Neurological:     General: No focal deficit present.     Mental Status: She is alert and oriented to person, place, and time.     Sensory: No sensory deficit.     Motor: No weakness or abnormal muscle tone.     Coordination: Coordination normal.  Psychiatric:        Mood and Affect: Mood normal.        Behavior: Behavior normal.      Imaging: No results found.

## 2020-05-14 ENCOUNTER — Other Ambulatory Visit: Payer: Self-pay

## 2020-05-14 ENCOUNTER — Encounter: Payer: Self-pay | Admitting: Family Medicine

## 2020-05-14 MED ORDER — FAMOTIDINE 40 MG PO TABS: ORAL_TABLET | ORAL | 0 refills | Status: DC

## 2020-05-14 MED FILL — FAMOTIDINE 40 MG TABS: 40 | 90 days supply | Qty: 90 | Fill #0

## 2020-06-06 ENCOUNTER — Other Ambulatory Visit: Payer: Self-pay

## 2020-06-06 ENCOUNTER — Ambulatory Visit (INDEPENDENT_AMBULATORY_CARE_PROVIDER_SITE_OTHER): Payer: No Typology Code available for payment source | Admitting: Specialist

## 2020-06-06 ENCOUNTER — Encounter: Payer: Self-pay | Admitting: Specialist

## 2020-06-06 VITALS — BP 121/78 | HR 79 | Ht 71.0 in | Wt 210.0 lb

## 2020-06-06 DIAGNOSIS — M5430 Sciatica, unspecified side: Secondary | ICD-10-CM

## 2020-06-06 DIAGNOSIS — M533 Sacrococcygeal disorders, not elsewhere classified: Secondary | ICD-10-CM | POA: Diagnosis not present

## 2020-06-06 DIAGNOSIS — M4726 Other spondylosis with radiculopathy, lumbar region: Secondary | ICD-10-CM | POA: Diagnosis not present

## 2020-06-06 DIAGNOSIS — M5116 Intervertebral disc disorders with radiculopathy, lumbar region: Secondary | ICD-10-CM | POA: Diagnosis not present

## 2020-06-06 DIAGNOSIS — M549 Dorsalgia, unspecified: Secondary | ICD-10-CM

## 2020-06-06 DIAGNOSIS — M5136 Other intervertebral disc degeneration, lumbar region: Secondary | ICD-10-CM

## 2020-06-06 NOTE — Progress Notes (Signed)
Office Visit Note   Patient: [name]           Date of Birth: [birth year]           MRN: [id number] Visit Date: 06/06/2020              Requested by: Fayrene Helper, [address], [place],  [place] [address] PCP: Fayrene Helper, MD   Assessment & Plan: Visit Diagnoses:  1. Back pain with sciatica   2. Lumbar disc herniation with radiculopathy   3. Sacroiliac joint disease   4. Other spondylosis with radiculopathy, lumbar region   5. Degenerative disc disease, lumbar     Plan: Avoid frequent bending and stooping  No lifting greater than 10 lbs. May use ice or moist heat for pain. Weight loss is of benefit. Best medication for lumbar disc disease is arthritis medications like diclofenac-misoprostal and naprosyn. Exercise is important to improve your indurance and does allow people to function better inspite of back pain.  Follow-Up Instructions: Return in about 4 months (around 10/05/2020).   Orders:  No orders of the defined types were placed in this encounter.  No orders of the defined types were placed in this encounter.     Procedures: No procedures performed   Clinical Data: No additional findings.   Subjective: Chief Complaint  Patient presents with  . Lower Back - Follow-up    49 year old female with lumbar ddd, spondylosis and disc herniation has undergone injection with ESI at L3-4 in early October with Dr. Ernestina Patches. She is doing well and having less pain, no sciatica There is back pain but not all day. She has some limitations. She is working at [employer] on the [employer]. 40 hour per week. Over all she is decreasing her medication use. Flexeril at night mainly and 2 tylenols for help sleep. No bowel or bladder difficulties.    Review of Systems  Constitutional: Negative.   HENT: Negative.   Eyes: Negative.   Respiratory: Negative.   Cardiovascular: Negative.   Gastrointestinal: Negative.   Endocrine: Negative.    Genitourinary: Negative.   Musculoskeletal: Negative.   Skin: Negative.   Allergic/Immunologic: Negative.   Neurological: Negative.   Hematological: Negative.   Psychiatric/Behavioral: Negative.      Objective: Vital Signs: BP 121/78 (BP Location: Left Arm, Patient Position: Sitting)   Pulse 79   Ht 5\' 11"  (1.803 m)   Wt 210 lb (95.3 kg)   BMI 29.29 kg/m   Physical Exam Constitutional:      Appearance: She is well-developed and well-nourished.  HENT:     Head: Normocephalic and atraumatic.  Eyes:     Extraocular Movements: EOM normal.     Pupils: Pupils are equal, round, and reactive to light.  Pulmonary:     Effort: Pulmonary effort is normal.     Breath sounds: Normal breath sounds.  Abdominal:     General: Bowel sounds are normal.     Palpations: Abdomen is soft.  Musculoskeletal:     Cervical back: Normal range of motion and neck supple.     Lumbar back: Negative right straight leg raise test and negative left straight leg raise test.  Skin:    General: Skin is warm and dry.  Neurological:     Mental Status: She is alert and oriented to person, place, and time.  Psychiatric:        Mood and Affect: Mood and affect normal.  Behavior: Behavior normal.        Thought Content: Thought content normal.        Judgment: Judgment normal.     Back Exam   Tenderness  The patient is experiencing tenderness in the lumbar.  Range of Motion  Extension: abnormal  Flexion: abnormal  Lateral bend right: abnormal  Lateral bend left: abnormal  Rotation right: abnormal  Rotation left: abnormal   Muscle Strength  Right Quadriceps:  5/5  Left Quadriceps:  5/5  Right Hamstrings:  5/5   Tests  Straight leg raise right: negative Straight leg raise left: negative  Other  Toe walk: abnormal Heel walk: abnormal Sensation: normal Gait: normal  Erythema: no back redness Scars: absent      Specialty Comments:  No specialty comments  available.  Imaging: No results found.   PMFS History: Patient Active Problem List   Diagnosis Date Noted  . Abnormal CT scan, pelvis 04/01/2020  . Need for Tdap vaccination 12/06/2019  . Symptomatic varicose veins, bilateral 09/01/2018  . Dyspepsia 01/18/2017  . Back pain with sciatica 09/20/2014  . Solitary lung nodule 09/05/2014  . Hyperlipidemia LDL goal <100 01/26/2014  . Overweight (BMI 25.0-29.9) 01/26/2014  . Annual physical exam 04/18/2012  . Allergic rhinitis 09/08/2010  . OTH ABNORMAL BRAIN & CNS FUNCTION STUDY 02/16/2010  . GOITER, UNSPECIFIED 02/10/2010  . Essential hypertension, benign 05/02/2009   Past Medical History:  Diagnosis Date  . Arthritis    Phreesia 03/29/2020  . Diabetes mellitus without complication ([place])    Phreesia 03/29/2020  . Essential hypertension, benign   . Hypertension    Phreesia 03/29/2020  . Migraine headache 2007  . Obesity    Since childhood   . Pneumonia 2007  . Spinal disease   . Supraventricular tachycardia [facility])    Status post RFA July 2015 - Dr. Lovena Le  . Type 2 diabetes mellitus ([place])   . Weight loss    Hopitalized 4 years ago after loosing 200lb on a supervised program, malnurished with severe protein deficiency     Family History  Problem Relation Age of Onset  . Hypertension Mother   . Diabetes Mother   . Cancer Mother        Uterine   . Heart failure Mother        CABG  . Cancer Father 79       Prostate   . Hypertension Father   . Bradycardia Father        Irregular heart beat   . Diabetes Brother   . Diabetes Brother   . Hypertension Brother   . Hypertension Brother     Past Surgical History:  Procedure Laterality Date  . ABLATION  12-28-2013   slow pathway modification of AVNRT by Dr Lovena Le  . COLONOSCOPY N/A 11/17/2019   Procedure: COLONOSCOPY;  Surgeon: Daneil Dolin, MD;  [facility];  Service: Endoscopy;  Laterality: N/A;  7:30  . ELECTROPHYSIOLOGY STUDY N/A 12/28/2013   Procedure:  ELECTROPHYSIOLOGY STUDY;  Surgeon: Evans Lance, MD;  Location: [facility] CATH LAB;  Service: Cardiovascular;  Laterality: N/A;  . SUPRAVENTRICULAR TACHYCARDIA ABLATION N/A 12/28/2013   Procedure: SUPRAVENTRICULAR TACHYCARDIA ABLATION;  Surgeon: Evans Lance, MD;  Location: [facility] CATH LAB;  Service: Cardiovascular;  Laterality: N/A;  . TUBAL LIGATION  1996   Social History   Occupational History  . Occupation: English as a second language teacher at [employer]  . Smoking status: Former Smoker    Packs/day: 0.50  Years: 7.00    Pack years: 3.50    Types: Cigarettes    Quit date: 08/05/2012    Years since quitting: 7.8  . Smokeless tobacco: Never Used  . Tobacco comment: quit in 2014  Vaping Use  . Vaping Use: Never used  Substance and Sexual Activity  . Alcohol use: No    Alcohol/week: 0.0 standard drinks  . Drug use: No  . Sexual activity: Yes    Birth control/protection: None, Surgical    Comment: tubal ligation

## 2020-06-06 NOTE — Patient Instructions (Addendum)
Avoid frequent bending and stooping  No lifting greater than 25 lbs. May use ice or moist heat for pain. Weight loss is of benefit. Best medication for lumbar disc disease is arthritis medications like diclofenac and naprosyn. Exercise is important to improve your indurance and does allow people to function better inspite of back pain.

## 2020-06-12 ENCOUNTER — Other Ambulatory Visit: Payer: Self-pay

## 2020-06-12 ENCOUNTER — Encounter: Payer: Self-pay | Admitting: Family Medicine

## 2020-06-12 ENCOUNTER — Other Ambulatory Visit: Payer: Self-pay | Admitting: Family Medicine

## 2020-06-12 DIAGNOSIS — M5430 Sciatica, unspecified side: Secondary | ICD-10-CM

## 2020-06-12 MED ORDER — CYCLOBENZAPRINE HCL 10 MG PO TABS: ORAL_TABLET | ORAL | 0 refills | Status: DC

## 2020-06-12 MED FILL — DICLOFENAC-MISOPROSTOL 50-0: 50-0.2 | 30 days supply | Qty: 60 | Fill #1

## 2020-06-12 MED FILL — CYCLOBENZAPRINE HCL 10 MG T: 10 | 90 days supply | Qty: 90 | Fill #0

## 2020-07-01 MED FILL — LOSARTAN-HCTZ 50-12.5 MG TA: 50-12.5 | 30 days supply | Qty: 30 | Fill #2

## 2020-07-30 ENCOUNTER — Encounter: Payer: Self-pay | Admitting: Specialist

## 2020-07-31 MED FILL — LOSARTAN-HCTZ 50-12.5 MG TA: 50-12.5 | 30 days supply | Qty: 30 | Fill #3

## 2020-08-02 LAB — BMP8+EGFR
BUN/Creatinine Ratio: 15 (ref 9–23)
BUN: 14 mg/dL (ref 6–24)
CO2: 22 mmol/L (ref 20–29)
Calcium: 9.5 mg/dL (ref 8.7–10.2)
Chloride: 101 mmol/L (ref 96–106)
Creatinine, Ser: 0.93 mg/dL (ref 0.57–1.00)
GFR calc Af Amer: 83 mL/min/{1.73_m2} (ref >59)
GFR calc non Af Amer: 72 mL/min/{1.73_m2} (ref >59)
Glucose: 85 mg/dL (ref 65–99)
Potassium: 4.2 mmol/L (ref 3.5–5.2)
Sodium: 140 mmol/L (ref 134–144)

## 2020-08-02 LAB — LIPID PANEL
Chol/HDL Ratio: 2.5 ratio (ref 0.0–4.4)
Cholesterol, Total: 174 mg/dL (ref 100–199)
HDL: 69 mg/dL (ref >39)
LDL Chol Calc (NIH): 91 mg/dL (ref 0–99)
Triglycerides: 75 mg/dL (ref 0–149)
VLDL Cholesterol Cal: 14 mg/dL (ref 5–40)

## 2020-08-06 ENCOUNTER — Telehealth (INDEPENDENT_AMBULATORY_CARE_PROVIDER_SITE_OTHER): Payer: No Typology Code available for payment source | Admitting: Family Medicine

## 2020-08-06 ENCOUNTER — Encounter: Payer: Self-pay | Admitting: Family Medicine

## 2020-08-06 ENCOUNTER — Telehealth: Payer: Self-pay | Admitting: Specialist

## 2020-08-06 ENCOUNTER — Other Ambulatory Visit: Payer: Self-pay

## 2020-08-06 VITALS — BP 121/74 | Ht 71.0 in | Wt 206.0 lb

## 2020-08-06 DIAGNOSIS — M5430 Sciatica, unspecified side: Secondary | ICD-10-CM

## 2020-08-06 DIAGNOSIS — Z1231 Encounter for screening mammogram for malignant neoplasm of breast: Secondary | ICD-10-CM | POA: Diagnosis not present

## 2020-08-06 DIAGNOSIS — E663 Overweight: Secondary | ICD-10-CM

## 2020-08-06 DIAGNOSIS — M549 Dorsalgia, unspecified: Secondary | ICD-10-CM

## 2020-08-06 DIAGNOSIS — I10 Essential (primary) hypertension: Secondary | ICD-10-CM | POA: Diagnosis not present

## 2020-08-06 DIAGNOSIS — E785 Hyperlipidemia, unspecified: Secondary | ICD-10-CM

## 2020-08-06 MED FILL — DICLOFENAC-MISOPROSTOL 50-0: 50-0.2 | 30 days supply | Qty: 60 | Fill #2

## 2020-08-06 NOTE — Telephone Encounter (Signed)
Patient called asked if she can be referred back to Dr Ernestina Patches for an injection in her back. The number to contact patient is [phone]

## 2020-08-06 NOTE — Progress Notes (Signed)
Virtual Visit via Telephone Note  I connected with [name] on 08/06/20 at  3:00 PM EST by telephone and verified that I am speaking with the correct person using two identifiers.  Location: Patient: work Provider: office   I discussed the limitations, risks, security and privacy concerns of performing an evaluation and management service by telephone and the availability of in person appointments. I also discussed with the patient that there may be a patient responsible charge related to this service. The patient expressed understanding and agreed to proceed.   History of Present Illness: Wants to be referred to wellness for help with weight loss Increased LBP in January, much worse in Feb, rated at a 5, non radiating, after epidural , she was pain free immediately following epidural , has reached out to  her ortho surgeon to get repeat epidural F/U chronic problems and address any new or current concerns. Review and update medications and allergies. Review recent lab and radiologic data . Update routine health maintainace. Review an encourage improved health habits to include nutrition, exercise and  sleep .  Denies recent fever or chills. Denies sinus pressure, nasal congestion, ear pain or sore throat. Denies chest congestion, productive cough or wheezing. Denies chest pains, palpitations and leg swelling Denies abdominal pain, nausea, vomiting,diarrhea or constipation.   Denies dysuria, frequency, hesitancy or incontinence.  Denies headaches, seizures, numbness, or tingling. Denies depression, anxiety or insomnia. Denies skin break down or rash.       Observations/Objective:  BP 121/74   Ht 5\' 11"  (1.803 m)   Wt 206 lb (93.4 kg)   LMP 07/09/2020   BMI 28.73 kg/m  Good communication with no confusion and intact memory. Alert and oriented x 3 No signs of respiratory distress during speech   Assessment and Plan:  Overweight (BMI 25.0-29.9)  Patient  re-educated about  the importance of commitment to a  minimum of 150 minutes of exercise per week as able.  The importance of healthy food choices with portion control discussed, as well as eating regularly and within a 12 hour window most days. The need to choose "clean , green" food 50 to 75% of the time is discussed, as well as to make water the primary drink and set a goal of 64 ounces water daily.    Weight /BMI 08/06/2020 06/06/2020 04/25/2020  WEIGHT 206 lb 210 lb 210 lb  HEIGHT 5\' 11"  5\' 11"  5\' 11"   BMI 28.73 kg/m2 29.29 kg/m2 29.29 kg/m2  Some encounter information is confidential and restricted. Go to Review Flowsheets activity to see all data.     no success with weight watchers wants to go to healthy weight and wellness, will refer  Essential hypertension, benign Controlled, no change in medication DASH diet and commitment to daily physical activity for a minimum of 30 minutes discussed and encouraged, as a part of hypertension management. The importance of attaining a healthy weight is also discussed.  BP/Weight 08/06/2020 06/06/2020 04/25/2020 04/10/2020 04/04/2020 04/01/2020 7/89/3810  Systolic BP 175 102 585 277 824 235 361  Diastolic BP 74 78 77 73 71 69 70  Wt. (Lbs) 206 210 210 - 210 210 195  BMI 28.73 29.29 29.29 - 29.29 29.29 27.2  Some encounter information is confidential and restricted. Go to Review Flowsheets activity to see all data.       Hyperlipidemia LDL goal <100 Hyperlipidemia:Low fat diet discussed and encouraged.   Lipid Panel  Lab Results  Component Value Date   CHOL  174 08/01/2020   HDL 69 08/01/2020   LDLCALC 91 08/01/2020   TRIG 75 08/01/2020   CHOLHDL 2.5 08/01/2020       Back pain with sciatica Current flare and uncontrolled, awaiting call for epidural injection    Follow Up Instructions:    I discussed the assessment and treatment plan with the patient. The patient was provided an opportunity to ask questions and all were  answered. The patient agreed with the plan and demonstrated an understanding of the instructions.   The patient was advised to call back or seek an in-person evaluation if the symptoms worsen or if the condition fails to improve as anticipated.  I provided 12 minutes of non-face-to-face time during this encounter.   Tula Nakayama, MD

## 2020-08-06 NOTE — Patient Instructions (Addendum)
Annual exam, no pap in 7 monhts, call if you need me before  Nurse visit in am for 9:30 for shingrix #2  I will refer you to healthy weight and wellness for help with weight loss since you are now " stuck"   If you do not hear from Ortho re referral for epidural by Thursday, please let us know so we can follow up on that  Excellent labs , keep up the great work!   Please schedul your mammogram as we discused  It is important that you exercise regularly at least 30 minutes 5 times a week. If you develop chest pain, have severe difficulty breathing, or feel very tired, stop exercising immediately and seek medical attention   Think about what you will eat, plan ahead. Choose " clean, green, fresh or frozen" over canned, processed or packaged foods which are more sugary, salty and fatty. 70 to 75% of food eaten should be vegetables and fruit. Three meals at set times with snacks allowed between meals, but they must be fruit or vegetables. Aim to eat over a 12 hour period , example 7 am to 7 pm, and STOP after  your last meal of the day. Drink water,generally about 64 ounces per day, no other drink is as healthy. Fruit juice is best enjoyed in a healthy way, by EATING the fruit.

## 2020-08-06 NOTE — Assessment & Plan Note (Signed)
  Patient re-educated about  the importance of commitment to a  minimum of 150 minutes of exercise per week as able.  The importance of healthy food choices with portion control discussed, as well as eating regularly and within a 12 hour window most days. The need to choose "clean , green" food 50 to 75% of the time is discussed, as well as to make water the primary drink and set a goal of 64 ounces water daily.    Weight /BMI 08/06/2020 06/06/2020 04/25/2020  WEIGHT 206 lb 210 lb 210 lb  HEIGHT 5\' 11"  5\' 11"  5\' 11"   BMI 28.73 kg/m2 29.29 kg/m2 29.29 kg/m2  Some encounter information is confidential and restricted. Go to Review Flowsheets activity to see all data.     no success with weight watchers wants to go to healthy weight and wellness, will refer

## 2020-08-06 NOTE — Telephone Encounter (Signed)
Patient would like a referral back to Dr. Ernestina Patches for injection.  Please advise.

## 2020-08-07 ENCOUNTER — Other Ambulatory Visit: Payer: Self-pay | Admitting: Specialist

## 2020-08-07 ENCOUNTER — Other Ambulatory Visit: Payer: Self-pay

## 2020-08-07 ENCOUNTER — Ambulatory Visit (INDEPENDENT_AMBULATORY_CARE_PROVIDER_SITE_OTHER): Payer: No Typology Code available for payment source

## 2020-08-07 DIAGNOSIS — Z23 Encounter for immunization: Secondary | ICD-10-CM | POA: Diagnosis not present

## 2020-08-07 DIAGNOSIS — M5116 Intervertebral disc disorders with radiculopathy, lumbar region: Secondary | ICD-10-CM

## 2020-08-09 NOTE — Assessment & Plan Note (Signed)
Controlled, no change in medication DASH diet and commitment to daily physical activity for a minimum of 30 minutes discussed and encouraged, as a part of hypertension management. The importance of attaining a healthy weight is also discussed.  BP/Weight 08/06/2020 06/06/2020 04/25/2020 04/10/2020 04/04/2020 04/01/2020 3/00/7622  Systolic BP 633 354 562 563 893 734 287  Diastolic BP 74 78 77 73 71 69 70  Wt. (Lbs) 206 210 210 - 210 210 195  BMI 28.73 29.29 29.29 - 29.29 29.29 27.2  Some encounter information is confidential and restricted. Go to Review Flowsheets activity to see all data.

## 2020-08-09 NOTE — Assessment & Plan Note (Signed)
Hyperlipidemia:Low fat diet discussed and encouraged.   Lipid Panel  Lab Results  Component Value Date   CHOL 174 08/01/2020   HDL 69 08/01/2020   LDLCALC 91 08/01/2020   TRIG 75 08/01/2020   CHOLHDL 2.5 08/01/2020

## 2020-08-09 NOTE — Assessment & Plan Note (Signed)
Current flare and uncontrolled, awaiting call for epidural injection

## 2020-08-13 ENCOUNTER — Telehealth: Payer: Self-pay | Admitting: Specialist

## 2020-08-13 NOTE — Telephone Encounter (Signed)
Insurance is PENDING

## 2020-08-13 NOTE — Telephone Encounter (Signed)
Insurance company called Note was sent from Dec 2016 and there is no reference to getting an injection. She needs clarification. Call her back [phone] Rachael Darby

## 2020-08-14 ENCOUNTER — Telehealth: Payer: Self-pay | Admitting: Physical Medicine and Rehabilitation

## 2020-08-14 NOTE — Telephone Encounter (Signed)
Pt was called and sch 3/8

## 2020-08-14 NOTE — Telephone Encounter (Signed)
Patient called. Would like to change the day of her appointment with Dr. Ernestina Patches

## 2020-08-14 NOTE — Telephone Encounter (Signed)
Rescheduled

## 2020-08-14 NOTE — Telephone Encounter (Signed)
Pt was approve  Case# [id number]  till  11/11/20

## 2020-08-15 ENCOUNTER — Other Ambulatory Visit: Payer: Self-pay | Admitting: Specialist

## 2020-08-15 ENCOUNTER — Other Ambulatory Visit: Payer: Self-pay | Admitting: Family Medicine

## 2020-08-15 MED FILL — FAMOTIDINE 40 MG TABS: 40 | 90 days supply | Qty: 90 | Fill #0

## 2020-08-15 NOTE — Telephone Encounter (Signed)
Ordered on 08/07/2020.

## 2020-08-22 ENCOUNTER — Ambulatory Visit (INDEPENDENT_AMBULATORY_CARE_PROVIDER_SITE_OTHER): Payer: No Typology Code available for payment source | Admitting: Family Medicine

## 2020-08-22 ENCOUNTER — Other Ambulatory Visit: Payer: Self-pay

## 2020-08-22 ENCOUNTER — Encounter (INDEPENDENT_AMBULATORY_CARE_PROVIDER_SITE_OTHER): Payer: Self-pay | Admitting: Family Medicine

## 2020-08-22 VITALS — BP 116/61 | HR 76 | Temp 98.2°F | Ht 71.0 in | Wt 205.0 lb

## 2020-08-22 DIAGNOSIS — Z9189 Other specified personal risk factors, not elsewhere classified: Secondary | ICD-10-CM | POA: Diagnosis not present

## 2020-08-22 DIAGNOSIS — G8929 Other chronic pain: Secondary | ICD-10-CM

## 2020-08-22 DIAGNOSIS — M549 Dorsalgia, unspecified: Secondary | ICD-10-CM

## 2020-08-22 DIAGNOSIS — Z0289 Encounter for other administrative examinations: Secondary | ICD-10-CM

## 2020-08-22 DIAGNOSIS — R0602 Shortness of breath: Secondary | ICD-10-CM

## 2020-08-22 DIAGNOSIS — E559 Vitamin D deficiency, unspecified: Secondary | ICD-10-CM | POA: Diagnosis not present

## 2020-08-22 DIAGNOSIS — I10 Essential (primary) hypertension: Secondary | ICD-10-CM

## 2020-08-22 DIAGNOSIS — F3289 Other specified depressive episodes: Secondary | ICD-10-CM

## 2020-08-22 DIAGNOSIS — R5383 Other fatigue: Secondary | ICD-10-CM | POA: Diagnosis not present

## 2020-08-22 DIAGNOSIS — E669 Obesity, unspecified: Secondary | ICD-10-CM

## 2020-08-22 DIAGNOSIS — G43809 Other migraine, not intractable, without status migrainosus: Secondary | ICD-10-CM

## 2020-08-22 DIAGNOSIS — Z6830 Body mass index (BMI) 30.0-30.9, adult: Secondary | ICD-10-CM

## 2020-08-22 DIAGNOSIS — E739 Lactose intolerance, unspecified: Secondary | ICD-10-CM

## 2020-08-22 DIAGNOSIS — I471 Supraventricular tachycardia: Secondary | ICD-10-CM

## 2020-08-22 DIAGNOSIS — E1169 Type 2 diabetes mellitus with other specified complication: Secondary | ICD-10-CM

## 2020-08-23 LAB — VITAMIN B12: Vitamin B-12: 556 pg/mL (ref 232–1245)

## 2020-08-23 LAB — CBC WITH DIFFERENTIAL/PLATELET
Basophils Absolute: 0.1 10*3/uL (ref 0.0–0.2)
Basos: 1 %
EOS (ABSOLUTE): 0.1 10*3/uL (ref 0.0–0.4)
Eos: 2 %
Hematocrit: 35 % (ref 34.0–46.6)
Hemoglobin: 10.8 g/dL — ABNORMAL LOW (ref 11.1–15.9)
Immature Grans (Abs): 0 10*3/uL (ref 0.0–0.1)
Immature Granulocytes: 0 %
Lymphocytes Absolute: 5 10*3/uL — ABNORMAL HIGH (ref 0.7–3.1)
Lymphs: 60 %
MCH: 23.7 pg — ABNORMAL LOW (ref 26.6–33.0)
MCHC: 30.9 g/dL — ABNORMAL LOW (ref 31.5–35.7)
MCV: 77 fL — ABNORMAL LOW (ref 79–97)
Monocytes Absolute: 0.7 10*3/uL (ref 0.1–0.9)
Monocytes: 9 %
Neutrophils Absolute: 2.3 10*3/uL (ref 1.4–7.0)
Neutrophils: 28 %
Platelets: 320 10*3/uL (ref 150–450)
RBC: 4.55 x10E6/uL (ref 3.77–5.28)
RDW: 14.8 % (ref 11.7–15.4)
WBC: 8.1 10*3/uL (ref 3.4–10.8)

## 2020-08-23 LAB — HEMOGLOBIN A1C
Est. average glucose Bld gHb Est-mCnc: 120 mg/dL
Hgb A1c MFr Bld: 5.8 % — ABNORMAL HIGH (ref 4.8–5.6)

## 2020-08-23 LAB — COMPREHENSIVE METABOLIC PANEL
ALT: 7 IU/L (ref 0–32)
AST: 14 IU/L (ref 0–40)
Albumin/Globulin Ratio: 1.6 (ref 1.2–2.2)
Albumin: 4.6 g/dL (ref 3.8–4.8)
Alkaline Phosphatase: 61 IU/L (ref 44–121)
BUN/Creatinine Ratio: 15 (ref 9–23)
BUN: 12 mg/dL (ref 6–24)
Bilirubin Total: <0.2 mg/dL (ref 0.0–1.2)
CO2: 20 mmol/L (ref 20–29)
Calcium: 9.2 mg/dL (ref 8.7–10.2)
Chloride: 99 mmol/L (ref 96–106)
Creatinine, Ser: 0.78 mg/dL (ref 0.57–1.00)
Globulin, Total: 2.9 g/dL (ref 1.5–4.5)
Glucose: 82 mg/dL (ref 65–99)
Potassium: 4 mmol/L (ref 3.5–5.2)
Sodium: 136 mmol/L (ref 134–144)
Total Protein: 7.5 g/dL (ref 6.0–8.5)
eGFR: 92 mL/min/{1.73_m2} (ref >59)

## 2020-08-23 LAB — T4, FREE: Free T4: 1.34 ng/dL (ref 0.82–1.77)

## 2020-08-23 LAB — TSH: TSH: 1.25 u[IU]/mL (ref 0.450–4.500)

## 2020-08-23 LAB — VITAMIN D 25 HYDROXY (VIT D DEFICIENCY, FRACTURES): Vit D, 25-Hydroxy: 38.5 ng/mL (ref 30.0–100.0)

## 2020-08-23 LAB — INSULIN, RANDOM: INSULIN: 7.8 u[IU]/mL (ref 2.6–24.9)

## 2020-08-26 NOTE — Progress Notes (Signed)
Dear Dr. Moshe Cipro,   Thank you for referring [name] to our clinic. The following note includes my evaluation and treatment recommendations.  Chief Complaint:   OBESITY [name] (MR# [id number]) is a 50 y.o. female who presents for evaluation and treatment of obesity and related comorbidities. Current BMI is Body mass index is 28.59 kg/m. [name] has been struggling with her weight for many years and has been unsuccessful in either losing weight, maintaining weight loss, or reaching her healthy weight goal.  [name] is currently in the action stage of change and ready to dedicate time achieving and maintaining a healthier weight. [name] is interested in becoming our patient and working on intensive lifestyle modifications including (but not limited to) diet and exercise for weight loss.  [name]'s habits were reviewed today and are as follows: Her family eats meals together, she thinks her family will eat healthier with her, her desired weight loss is 22 pounds, she has been heavy most of her life, she started gaining weight in childhood, her heaviest weight ever was 333 pounds, she craves sweets, she snacks frequently in the evenings, she frequently eats larger portions than normal and she struggles with emotional eating.  Depression Screen [name]'s Food and Mood (modified PHQ-9) score was 8.  Depression screen PHQ 2/9 08/22/2020  Decreased Interest 1  Down, Depressed, Hopeless 1  PHQ - 2 Score 2  Altered sleeping 0  Tired, decreased energy 2  Change in appetite 3  Feeling bad or failure about yourself  1  Trouble concentrating 0  Moving slowly or fidgety/restless 0  Suicidal thoughts 0  PHQ-9 Score 8  Difficult doing work/chores Not difficult at all  Some encounter information is confidential and restricted. Go to Review Flowsheets activity to see all data.  Some recent data might be hidden   Assessment/Plan:   1. Other fatigue [name] denies daytime  somnolence and reports waking up still tired. [name] has a history of symptoms of morning fatigue. [name] generally gets 8 hours of sleep per night, and states that she has generally restful sleep. Snoring is not present. Apneic episodes are not present. Epworth Sleepiness Score is 3.  [name] does feel that her weight is causing her energy to be lower than it should be. Fatigue may be related to obesity, depression or many other causes. Labs will be ordered, and in the meanwhile, [name] will focus on self care including making healthy food choices, increasing physical activity and focusing on stress reduction.  - EKG 12-Lead - CBC with Differential/Platelet - Comprehensive metabolic panel - Insulin, random - Hemoglobin A1c - VITAMIN D 25 Hydroxy (Vit-D Deficiency, Fractures) - TSH - T4, free - Vitamin B12  2. SOB (shortness of breath) on exertion [name] notes increasing shortness of breath with exercising and seems to be worsening over time with weight gain. She notes getting out of breath sooner with activity than she used to. This has not gotten worse recently. [name] denies shortness of breath at rest or orthopnea.  [name] does not feel that she gets out of breath more easily that she used to when she exercises. [name]'s shortness of breath appears to be obesity related and exercise induced. She has agreed to work on weight loss and gradually increase exercise to treat her exercise induced shortness of breath. Will continue to monitor closely.  - CBC with Differential/Platelet - Comprehensive metabolic panel - Insulin, random - Hemoglobin A1c - VITAMIN D 25 Hydroxy (Vit-D Deficiency, Fractures) - TSH -  T4, free - Vitamin B12  3. Essential hypertension At goal. Medications: losartan-HCTZ 50-12.5 mg daily.   Plan: Avoid buying foods that are: processed, frozen, or prepackaged to avoid excess salt. We will watch for signs of hypotension as she continues lifestyle modifications. We will  continue to monitor closely alongside her PCP and/or Specialist.  Regular follow up with PCP and specialists was also encouraged.  Will check labs today.  BP Readings from Last 3 Encounters:  08/22/20 116/61  08/06/20 121/74  06/06/20 121/78   - CBC with Differential/Platelet - Comprehensive metabolic panel - TSH - T4, free  4. Chronic back pain Lumbar stenosis, DDD, pelvic OA.  She did PT last year in August.  She is taking gabapentin as needed.  5. Vitamin D deficiency Not at goal. Current vitamin D is 30.4, tested on 12/07/2019. Optimal goal > 50 ng/dL.  She is taking OTC vitamin D 1,000 IU daily.  Plan: Continue current OTC vitamin D supplementation of 1,000 IU daily.  Will check vitamin D level today, as per below.  - VITAMIN D 25 Hydroxy (Vit-D Deficiency, Fractures)  6. Type 2 diabetes mellitus with other specified complication, without long-term current use of insulin (HCC) Diabetes Mellitus: Controlled. Medication: None.  Diet controlled for years.  Issues reviewed: blood sugar goals, complications of diabetes mellitus, hypoglycemia prevention and treatment, exercise, and nutrition.  A1c was 7.8 on 09/22/2012.  Plan: The importance of regular follow up with PCP and all other specialists as scheduled was stressed to patient today.  Will check CMP, insulin level, and A1c today.  Lab Results  Component Value Date   HGBA1C 5.4 03/15/2019   HGBA1C 5.4 08/23/2017   Lab Results  Component Value Date   MICROALBUR <0.2 08/23/2017   [name] 91 08/01/2020   - Comprehensive metabolic panel - Insulin, random - Hemoglobin A1c  7. Lactose intolerance [name] takes Lactaid for lactose intolerance.  8. SVT (supraventricular tachycardia) ([place]) In 2015.  Had cardiac ablations.  9. Other migraine without status migrainosus, not intractable [name] says she has not had a migraine in a few years.  10. Other depression, with emotional eating Not at goal. Medication: None.  [name] eats  when stressed, when bored, and for comfort.  Plan: Behavior modification techniques were discussed today to help deal with emotional/non-hunger eating behaviors.  11. At risk for heart disease Due to [name]'s current state of health and medical condition(s), she is at a higher risk for heart disease.  This puts the patient at much greater risk to subsequently develop cardiopulmonary conditions that can significantly affect patient's quality of life in a negative manner.    At least 9 minutes were spent on counseling Maurya about these concerns today. Counseling:  Intensive lifestyle modifications were discussed with Gabi as the most appropriate first line of treatment.  We will continue to reassess these conditions on a fairly regular basis in an attempt to decrease the patient's overall morbidity and mortality.  Evidence-based interventions for health behavior change were utilized today including the discussion of self monitoring techniques, problem-solving barriers, and SMART goal setting techniques.  Specifically, regarding patient's less desirable eating habits and patterns, we employed the technique of small changes when Austina has not been able to fully commit to her prudent nutritional plan.  12. Class 1 obesity with serious comorbidity and body mass index (BMI) of 30.0 to 30.9 in adult, unspecified obesity type  Floella is currently in the action stage of change and her goal is to continue  with weight loss efforts. I recommend Kealey begin the structured treatment plan as follows:  She has agreed to the Category 3 Plan.  Exercise goals: As is.   Behavioral modification strategies: increasing lean protein intake, decreasing simple carbohydrates, increasing vegetables, increasing water intake, decreasing liquid calories, decreasing alcohol intake, decreasing sodium intake and increasing high fiber foods.  She was informed of the importance of frequent follow-up visits to maximize her  success with intensive lifestyle modifications for her multiple health conditions. She was informed we would discuss her lab results at her next visit unless there is a critical issue that needs to be addressed sooner. Anni agreed to keep her next visit at the agreed upon time to discuss these results.  Objective:   Blood pressure 116/61, pulse 76, temperature 98.2 F (36.8 C), temperature source Oral, height 5\' 11"  (1.803 m), weight 205 lb (93 kg), last menstrual period 07/09/2020, SpO2 99 %. Body mass index is 28.59 kg/m.  EKG: Normal sinus rhythm, rate 77 bpm.  Indirect Calorimeter completed today shows a VO2 of 274 and a REE of 1909.  Her calculated basal metabolic rate is 8828 thus her basal metabolic rate is better than expected.  General: Cooperative, alert, well developed, in no acute distress. HEENT: Conjunctivae and lids unremarkable. Cardiovascular: Regular rhythm.  Lungs: Normal work of breathing. Neurologic: No focal deficits.   Lab Results  Component Value Date   HGBA1C 5.4 03/15/2019   HGBA1C 5.4 08/23/2017   HGBA1C 5.3 08/22/2016   HGBA1C 5.5 04/20/2016   Lab Results  Component Value Date   CHOL 174 08/01/2020   HDL 69 08/01/2020   LDLCALC 91 08/01/2020   TRIG 75 08/01/2020   CHOLHDL 2.5 08/01/2020   Attestation Statements:   This is the patient's first visit at [facility]. The patient's NEW PATIENT PACKET was reviewed at length. Included in the packet: current and past health history, medications, allergies, ROS, gynecologic history (women only), surgical history, family history, social history, weight history, weight loss surgery history (for those that have had weight loss surgery), nutritional evaluation, mood and food questionnaire, PHQ9, Epworth questionnaire, sleep habits questionnaire, patient life and health improvement goals questionnaire. These will all be scanned into the patient's chart under media.   During the visit, I  independently reviewed the patient's EKG, bioimpedance scale results, and indirect calorimeter results. I used this information to tailor a meal plan for the patient that will help her to lose weight and will improve her obesity-related conditions going forward. I performed a medically necessary appropriate examination and/or evaluation. I discussed the assessment and treatment plan with the patient. The patient was provided an opportunity to ask questions and all were answered. The patient agreed with the plan and demonstrated an understanding of the instructions. Labs were ordered at this visit and will be reviewed at the next visit unless more critical results need to be addressed immediately. Clinical information was updated and documented in the EMR.   I, Water quality scientist, CMA, am acting as transcriptionist for Briscoe Deutscher, DO  I have reviewed the above documentation for accuracy and completeness, and I agree with the above. Briscoe Deutscher, DO

## 2020-08-27 ENCOUNTER — Ambulatory Visit: Payer: No Typology Code available for payment source | Admitting: Physical Medicine and Rehabilitation

## 2020-08-29 ENCOUNTER — Ambulatory Visit: Payer: Self-pay

## 2020-08-29 ENCOUNTER — Other Ambulatory Visit: Payer: Self-pay

## 2020-08-29 ENCOUNTER — Ambulatory Visit (INDEPENDENT_AMBULATORY_CARE_PROVIDER_SITE_OTHER): Payer: No Typology Code available for payment source | Admitting: Physical Medicine and Rehabilitation

## 2020-08-29 ENCOUNTER — Encounter: Payer: Self-pay | Admitting: Physical Medicine and Rehabilitation

## 2020-08-29 VITALS — BP 112/76 | HR 90

## 2020-08-29 DIAGNOSIS — M5416 Radiculopathy, lumbar region: Secondary | ICD-10-CM | POA: Diagnosis not present

## 2020-08-29 MED ORDER — METHYLPREDNISOLONE ACETATE 80 MG/ML IJ SUSP
80.0000 mg | Freq: Once | INTRAMUSCULAR | Status: AC
  Administered 2020-08-29: 80 mg

## 2020-08-29 NOTE — Progress Notes (Signed)
Pt state lower that travels to both hips. Pt state sitting makes the pain worse. Pt state she takes over the counter pain meds to help ease the pain. Pt has hx of inj on 04/10/20 pt state it helped for five months with 100% relief.   Numeric Pain Rating Scale and Functional Assessment Average Pain 7   In the last MONTH (on 0-10 scale) has pain interfered with the following?  1. General activity like being  able to carry out your everyday physical activities such as walking, climbing stairs, carrying groceries, or moving a chair?  Rating(8)   +Driver, -BT, -Dye Allergies.

## 2020-08-29 NOTE — Procedures (Signed)
Lumbar Epidural Steroid Injection - Interlaminar Approach with Fluoroscopic Guidance  Patient: [name]      Date of Birth: 23-Jun-1970 MRN: [id number] PCP: Fayrene Helper, MD      Visit Date: 08/29/2020   Universal Protocol:     Consent Given By: the patient  Position: PRONE  Additional Comments: Vital signs were monitored before and after the procedure. Patient was prepped and draped in the usual sterile fashion. The correct patient, procedure, and site was verified.   Injection Procedure Details:   Procedure diagnoses: Lumbar radiculopathy [M54.16]   Meds Administered:  Meds ordered this encounter  Medications  . methylPREDNISolone acetate (DEPO-MEDROL) injection 80 mg     Laterality: Left  Location/Site:  L3-L4  Needle: 3.5 in., 20 ga. Tuohy  Needle Placement: Paramedian epidural  Findings:   -Comments: Excellent flow of contrast into the epidural space.  Procedure Details: Using a paramedian approach from the side mentioned above, the region overlying the inferior lamina was localized under fluoroscopic visualization and the soft tissues overlying this structure were infiltrated with 4 ml. of 1% Lidocaine without Epinephrine. The Tuohy needle was inserted into the epidural space using a paramedian approach.   The epidural space was localized using loss of resistance along with counter oblique bi-planar fluoroscopic views.  After negative aspirate for air, blood, and CSF, a 2 ml. volume of Isovue-250 was injected into the epidural space and the flow of contrast was observed. Radiographs were obtained for documentation purposes.    The injectate was administered into the level noted above.   Additional Comments:  The patient tolerated the procedure well Dressing: 2 x 2 sterile gauze and Band-Aid    Post-procedure details: Patient was observed during the procedure. Post-procedure instructions were reviewed.  Patient left the clinic in stable  condition.

## 2020-08-29 NOTE — Patient Instructions (Signed)

## 2020-08-29 NOTE — Progress Notes (Signed)
[name] - 50 y.o. female MRN [id number]  Date of birth: [birth year]  Office Visit Note: Visit Date: 08/29/2020 PCP: Fayrene Helper, MD Referred by: Fayrene Helper, MD  Subjective: Chief Complaint  Patient presents with  . Lower Back - Pain  . Right Hip - Pain  . Left Hip - Pain   HPI:  [name] is a 50 y.o. female who comes in today for planned repeat Left L3-L4 Lumbar epidural steroid injection with fluoroscopic guidance.  The patient has failed conservative care including home exercise, medications, time and activity modification.  This injection will be diagnostic and hopefully therapeutic.  Please see requesting physician notes for further details and justification. Patient received more than 80% pain relief from prior injection.  She got upwards of 5 months of relief. MRI reviewed with images and spine model.  MRI reviewed in the note below.   Referring: Dr. Basil Dess    ROS Otherwise per HPI.  Assessment & Plan: Visit Diagnoses:    ICD-10-CM   1. Lumbar radiculopathy  M54.16 XR C-ARM NO REPORT    Epidural Steroid injection    methylPREDNISolone acetate (DEPO-MEDROL) injection 80 mg    Plan: No additional findings.   Meds & Orders:  Meds ordered this encounter  Medications  . methylPREDNISolone acetate (DEPO-MEDROL) injection 80 mg    Orders Placed This Encounter  Procedures  . XR C-ARM NO REPORT  . Epidural Steroid injection    Follow-up: Return for visit to requesting physician as needed.   Procedures: No procedures performed  Lumbar Epidural Steroid Injection - Interlaminar Approach with Fluoroscopic Guidance  Patient: [name]      Date of Birth: 1971/04/20 MRN: [id number] PCP: Fayrene Helper, MD      Visit Date: 08/29/2020   Universal Protocol:     Consent Given By: the patient  Position: PRONE  Additional Comments: Vital signs were monitored before and after the procedure. Patient was prepped and  draped in the usual sterile fashion. The correct patient, procedure, and site was verified.   Injection Procedure Details:   Procedure diagnoses: Lumbar radiculopathy [M54.16]   Meds Administered:  Meds ordered this encounter  Medications  . methylPREDNISolone acetate (DEPO-MEDROL) injection 80 mg     Laterality: Left  Location/Site:  L3-L4  Needle: 3.5 in., 20 ga. Tuohy  Needle Placement: Paramedian epidural  Findings:   -Comments: Excellent flow of contrast into the epidural space.  Procedure Details: Using a paramedian approach from the side mentioned above, the region overlying the inferior lamina was localized under fluoroscopic visualization and the soft tissues overlying this structure were infiltrated with 4 ml. of 1% Lidocaine without Epinephrine. The Tuohy needle was inserted into the epidural space using a paramedian approach.   The epidural space was localized using loss of resistance along with counter oblique bi-planar fluoroscopic views.  After negative aspirate for air, blood, and CSF, a 2 ml. volume of Isovue-250 was injected into the epidural space and the flow of contrast was observed. Radiographs were obtained for documentation purposes.    The injectate was administered into the level noted above.   Additional Comments:  The patient tolerated the procedure well Dressing: 2 x 2 sterile gauze and Band-Aid    Post-procedure details: Patient was observed during the procedure. Post-procedure instructions were reviewed.  Patient left the clinic in stable condition.     Clinical History: Lumbar radiculopathy. Worsening low back pain. No prior back surgery  EXAM: MRI  LUMBAR SPINE WITHOUT CONTRAST  TECHNIQUE: Multiplanar, multisequence MR imaging of the lumbar spine was performed. No intravenous contrast was administered.  COMPARISON:  MRI lumbar spine 06/19/2005. Lumbar spine radiographs 12/14/2019  FINDINGS: Segmentation:   Normal  Alignment:  Normal  Vertebrae:  Negative for fracture or mass.  Conus medullaris and cauda equina: Conus extends to the L1-2 level. Conus and cauda equina appear normal.  Paraspinal and other soft tissues: 17 x 20 mm cyst in the right pelvis medial to the iliac vessels. This is not seen previously. Otherwise no mass or adenopathy.  Disc levels:  T12-L1: Negative  L1-2: Negative  L2-3: Negative  L3-4: Progressive disc degeneration with disc space narrowing and Schmorl's node. Disc degeneration is asymmetric to the right with sclerotic bone marrow changes. Small left paracentral disc protrusion has developed in the interval. Mild facet hypertrophy. Mild displacement left L4 nerve root in the subarticular zone without nerve root compression. Spinal canal adequate in size.  L4-5: Progressive disc degeneration with disc space narrowing and endplate sclerotic changes. No significant spinal or foraminal stenosis  L5-S1: Mild disc degeneration with mild disc bulging. Negative for stenosis.  IMPRESSION: 1. Progressive disc degeneration L3-4. Small left paracentral disc protrusion with mild displacement left L4 nerve root. No nerve root compression or significant stenosis 2. Progressive disc degeneration L4-5 without significant stenosis 3. 17 x 20 mm cyst in the right pelvis medial to the iliac vessels. Assuming the patient has no malignancy, probable benign finding.   Electronically Signed   By: Franchot Gallo M.D.   On: 03/06/2020 08:31     Objective:  VS:  HT:    WT:   BMI:     BP:112/76  HR:90bpm  TEMP: ( )  RESP:  Physical Exam Vitals and nursing note reviewed.  Constitutional:      General: She is not in acute distress.    Appearance: Normal appearance. She is not ill-appearing.  HENT:     Head: Normocephalic and atraumatic.     Right Ear: External ear normal.     Left Ear: External ear normal.  Eyes:     Extraocular Movements:  Extraocular movements intact.  Cardiovascular:     Rate and Rhythm: Normal rate.     Pulses: Normal pulses.  Pulmonary:     Effort: Pulmonary effort is normal. No respiratory distress.  Abdominal:     General: There is no distension.     Palpations: Abdomen is soft.  Musculoskeletal:        General: Tenderness present.     Cervical back: Neck supple.     Right lower leg: No edema.     Left lower leg: No edema.     Comments: Patient has good distal strength with no pain over the greater trochanters.  No clonus or focal weakness.  Skin:    Findings: No erythema, lesion or rash.  Neurological:     General: No focal deficit present.     Mental Status: She is alert and oriented to person, place, and time.     Sensory: No sensory deficit.     Motor: No weakness or abnormal muscle tone.     Coordination: Coordination normal.  Psychiatric:        Mood and Affect: Mood normal.        Behavior: Behavior normal.      Imaging: XR C-ARM NO REPORT  Result Date: 08/29/2020 Please see Notes tab for imaging impression.

## 2020-08-30 MED FILL — LOSARTAN-HCTZ 50-12.5 MG TA: 50-12.5 | 30 days supply | Qty: 30 | Fill #4

## 2020-09-05 ENCOUNTER — Other Ambulatory Visit (INDEPENDENT_AMBULATORY_CARE_PROVIDER_SITE_OTHER): Payer: Self-pay | Admitting: Family Medicine

## 2020-09-05 ENCOUNTER — Encounter (INDEPENDENT_AMBULATORY_CARE_PROVIDER_SITE_OTHER): Payer: Self-pay | Admitting: Family Medicine

## 2020-09-05 ENCOUNTER — Other Ambulatory Visit: Payer: Self-pay

## 2020-09-05 ENCOUNTER — Ambulatory Visit (INDEPENDENT_AMBULATORY_CARE_PROVIDER_SITE_OTHER): Payer: No Typology Code available for payment source | Admitting: Family Medicine

## 2020-09-05 VITALS — BP 130/72 | HR 75 | Temp 98.0°F | Ht 71.0 in | Wt 204.0 lb

## 2020-09-05 DIAGNOSIS — R632 Polyphagia: Secondary | ICD-10-CM

## 2020-09-05 DIAGNOSIS — G478 Other sleep disorders: Secondary | ICD-10-CM | POA: Diagnosis not present

## 2020-09-05 DIAGNOSIS — D508 Other iron deficiency anemias: Secondary | ICD-10-CM

## 2020-09-05 DIAGNOSIS — R7303 Prediabetes: Secondary | ICD-10-CM | POA: Diagnosis not present

## 2020-09-05 DIAGNOSIS — E669 Obesity, unspecified: Secondary | ICD-10-CM

## 2020-09-05 DIAGNOSIS — E559 Vitamin D deficiency, unspecified: Secondary | ICD-10-CM

## 2020-09-05 DIAGNOSIS — Z9189 Other specified personal risk factors, not elsewhere classified: Secondary | ICD-10-CM | POA: Diagnosis not present

## 2020-09-05 DIAGNOSIS — Z6830 Body mass index (BMI) 30.0-30.9, adult: Secondary | ICD-10-CM

## 2020-09-05 MED ORDER — WEGOVY 0.25 MG/0.5ML ~~LOC~~ SOAJ: 0.2500 mg | SUBCUTANEOUS | 0 refills | Status: DC

## 2020-09-09 NOTE — Progress Notes (Signed)
Chief Complaint:   OBESITY [name] is here to discuss her progress with her obesity treatment plan along with follow-up of her obesity related diagnoses.   Today's visit was #: 2 Starting weight: 205 lbs Starting date: 08/22/2020 Today's weight: 204 lbs Today's date: 09/05/2020 Total lbs lost to date: 1 lb Body mass index is 28.45 kg/m.  Total weight loss percentage to date: -0.49%  Interim History:  [name] has been doing nighttime eating.  Still wanted a snack.  Today's bioimpedance results indicate that [name] has gained 3 pounds of water weight since her last visit.  Current Meal Plan: the Category 3 Plan for 100% of the time.  Current Exercise Plan: None.  Assessment/Plan:   1. Other iron deficiency anemia Hgb 10.8.  Nutrition: Iron-rich foods include dark leafy greens, red and white meats, eggs, seafood, and beans.  Certain foods and drinks prevent your body from absorbing iron properly. Avoid eating these foods in the same meal as iron-rich foods or with iron supplements. These foods include: coffee, black tea, and red wine; milk, dairy products, and foods that are high in calcium; beans and soybeans; whole grains. Constipation can be a side effect of iron supplementation. Increased water and fiber intake are helpful. Water goal: > 2 liters/day. Fiber goal: > 25 grams/day.  Plan:  Will check CBC, TIBC at next visit.  CBC Latest Ref Rng & Units 08/22/2020 12/07/2019 08/23/2017  WBC 3.4 - 10.8 x10E3/uL 8.1 9.1 9.3  Hemoglobin 11.1 - 15.9 g/dL 10.8(L) 12.0 13.5  Hematocrit 34.0 - 46.6 % 35.0 37.6 40.1  Platelets 150 - 450 x10E3/uL 320 250 253   Lab Results  Component Value Date   VITAMINB12 556 08/22/2020   2. Prediabetes Not at goal. Goal is HgbA1c < 5.7.  Medication: None.    Plan:  She will continue to focus on protein-rich, low simple carbohydrate foods. We reviewed the importance of hydration, regular exercise for stress reduction, and restorative sleep.   Lab Results   Component Value Date   HGBA1C 5.8 (H) 08/22/2020   Lab Results  Component Value Date   INSULIN 7.8 08/22/2020   3. Vitamin D deficiency Not optimized. Current vitamin D is 38.5, tested on 08/22/2020. Optimal goal > 50 ng/dL.  She is taking OTC vitamin D 1,000 IU daily.  Plan: Continue to take prescription Vitamin D @1 ,000 IU daily as prescribed.  Follow-up for routine testing of Vitamin D, at least 2-3 times per year to avoid over-replacement.  4. Nocturnal sleep-related eating disorder Night Eating Syndrome (NES) is currently included in the "Other Specified Feeding or Eating Disorder" category of the DSM-5.  Counseling . Night Eating Syndrome is characterized by eating after awakening from sleep or by excessive food consumption (usually > 25% of daily calories) after the evening meal. It is "relapsing and remitting," meaning that it comes and goes. It usually worsens during stressful times in your life. The person is aware and recalls the eating. Night Eating Syndrome usually starts in early adulthood. Men and women are both affected, but women tend to have more severe symptoms.  . Interesting statistics: o The prevalence of NES is 1.5% of the general population, but found in 8.9% of patients that enroll in an obesity medicine program. Approximately 15-20% ALSO have Binge Eating Disorder.  . Treatment for Night Eating Syndrome include: o CBT, SSRIs, Topiramate, Progressive Muscle Relaxation, Phototherapy, and Normalized Eating.  5. Polyphagia Not at goal. Current treatment: None.   Polyphagia refers to  excessive feelings of hunger. She will continue to focus on protein-rich, low simple carbohydrate foods. We reviewed the importance of hydration, regular exercise for stress reduction, and restorative sleep.  - Start Semaglutide-Weight Management (WEGOVY) 0.25 MG/0.5ML SOAJ; Inject 0.25 mg into the skin once a week.  Dispense: 2 mL; Refill: 0  6. At risk for diabetes mellitus [name] was  given diabetes prevention education and counseling today of more than 8 minutes. She will continue to focus on protein-rich, low simple carbohydrate foods. We reviewed the importance of hydration, regular exercise for stress reduction, and restorative sleep.   7. Class 1 obesity with serious comorbidity and body mass index (BMI) of 30.0 to 30.9 in adult, unspecified obesity type  Course: [name] is currently in the action stage of change. As such, her goal is to continue with weight loss efforts.   Nutrition goals: She has agreed to the Category 3 Plan.   Exercise goals: Increase activity.  Behavioral modification strategies: increasing lean protein intake, decreasing simple carbohydrates, increasing vegetables and increasing water intake.  [name] has agreed to follow-up with our clinic in 2-3 weeks. She was informed of the importance of frequent follow-up visits to maximize her success with intensive lifestyle modifications for her multiple health conditions.   Objective:   Blood pressure 130/72, pulse 75, temperature 98 F (36.7 C), temperature source Oral, height 5\' 11"  (1.803 m), weight 204 lb (92.5 kg), SpO2 97 %. Body mass index is 28.45 kg/m.  General: Cooperative, alert, well developed, in no acute distress. HEENT: Conjunctivae and lids unremarkable. Cardiovascular: Regular rhythm.  Lungs: Normal work of breathing. Neurologic: No focal deficits.   Lab Results  Component Value Date   CREATININE 0.78 08/22/2020   BUN 12 08/22/2020   NA 136 08/22/2020   K 4.0 08/22/2020   CL 99 08/22/2020   CO2 20 08/22/2020   Lab Results  Component Value Date   ALT 7 08/22/2020   AST 14 08/22/2020   ALKPHOS 61 08/22/2020   BILITOT <0.2 08/22/2020   Lab Results  Component Value Date   HGBA1C 5.8 (H) 08/22/2020   HGBA1C 5.4 03/15/2019   HGBA1C 5.4 08/23/2017   HGBA1C 5.3 08/22/2016   HGBA1C 5.5 04/20/2016   Lab Results  Component Value Date   INSULIN 7.8 08/22/2020   Lab  Results  Component Value Date   TSH 1.250 08/22/2020   Lab Results  Component Value Date   CHOL 174 08/01/2020   HDL 69 08/01/2020   LDLCALC 91 08/01/2020   TRIG 75 08/01/2020   CHOLHDL 2.5 08/01/2020   Lab Results  Component Value Date   WBC 8.1 08/22/2020   HGB 10.8 (L) 08/22/2020   HCT 35.0 08/22/2020   MCV 77 (L) 08/22/2020   PLT 320 08/22/2020   Attestation Statements:   Reviewed by clinician on day of visit: allergies, medications, problem list, medical history, surgical history, family history, social history, and previous encounter notes.  I, Water quality scientist, CMA, am acting as transcriptionist for Briscoe Deutscher, DO  I have reviewed the above documentation for accuracy and completeness, and I agree with the above. Briscoe Deutscher, DO

## 2020-09-10 ENCOUNTER — Other Ambulatory Visit (HOSPITAL_BASED_OUTPATIENT_CLINIC_OR_DEPARTMENT_OTHER): Payer: Self-pay

## 2020-09-12 ENCOUNTER — Encounter: Payer: Self-pay | Admitting: Family Medicine

## 2020-09-12 ENCOUNTER — Other Ambulatory Visit: Payer: Self-pay

## 2020-09-12 DIAGNOSIS — Z0100 Encounter for examination of eyes and vision without abnormal findings: Secondary | ICD-10-CM

## 2020-09-13 MED FILL — WEGOVY 0.25 MG/0.5ML SOAJ: 0.25 | 28 days supply | Qty: 2 | Fill #0

## 2020-09-17 ENCOUNTER — Encounter (INDEPENDENT_AMBULATORY_CARE_PROVIDER_SITE_OTHER): Payer: Self-pay

## 2020-09-23 ENCOUNTER — Other Ambulatory Visit (HOSPITAL_COMMUNITY): Payer: Self-pay

## 2020-09-23 ENCOUNTER — Ambulatory Visit (INDEPENDENT_AMBULATORY_CARE_PROVIDER_SITE_OTHER): Payer: No Typology Code available for payment source | Admitting: Adult Health

## 2020-09-23 ENCOUNTER — Encounter (INDEPENDENT_AMBULATORY_CARE_PROVIDER_SITE_OTHER): Payer: Self-pay | Admitting: Adult Health

## 2020-09-23 ENCOUNTER — Other Ambulatory Visit: Payer: Self-pay

## 2020-09-23 VITALS — BP 105/64 | HR 68 | Temp 98.0°F

## 2020-09-23 DIAGNOSIS — I10 Essential (primary) hypertension: Secondary | ICD-10-CM | POA: Diagnosis not present

## 2020-09-23 DIAGNOSIS — D508 Other iron deficiency anemias: Secondary | ICD-10-CM | POA: Diagnosis not present

## 2020-09-23 DIAGNOSIS — E669 Obesity, unspecified: Secondary | ICD-10-CM

## 2020-09-23 DIAGNOSIS — Z9189 Other specified personal risk factors, not elsewhere classified: Secondary | ICD-10-CM | POA: Diagnosis not present

## 2020-09-23 DIAGNOSIS — R632 Polyphagia: Secondary | ICD-10-CM

## 2020-09-23 DIAGNOSIS — Z6830 Body mass index (BMI) 30.0-30.9, adult: Secondary | ICD-10-CM

## 2020-09-23 MED ORDER — SEMAGLUTIDE-WEIGHT MANAGEMENT 0.25 MG/0.5ML ~~LOC~~ SOAJ
0.2500 mg | SUBCUTANEOUS | 0 refills | Status: DC
  Filled 2020-09-23: qty 2, 28d supply, fill #0

## 2020-09-24 ENCOUNTER — Encounter (INDEPENDENT_AMBULATORY_CARE_PROVIDER_SITE_OTHER): Payer: Self-pay

## 2020-09-24 ENCOUNTER — Other Ambulatory Visit (INDEPENDENT_AMBULATORY_CARE_PROVIDER_SITE_OTHER): Payer: Self-pay | Admitting: Adult Health

## 2020-09-24 ENCOUNTER — Telehealth (INDEPENDENT_AMBULATORY_CARE_PROVIDER_SITE_OTHER): Payer: Self-pay

## 2020-09-24 DIAGNOSIS — D508 Other iron deficiency anemias: Secondary | ICD-10-CM

## 2020-09-24 LAB — CBC
Hematocrit: 31.9 % — ABNORMAL LOW (ref 34.0–46.6)
Hemoglobin: 9.9 g/dL — ABNORMAL LOW (ref 11.1–15.9)
MCH: 23.7 pg — ABNORMAL LOW (ref 26.6–33.0)
MCHC: 31 g/dL — ABNORMAL LOW (ref 31.5–35.7)
MCV: 77 fL — ABNORMAL LOW (ref 79–97)
Platelets: 281 10*3/uL (ref 150–450)
RBC: 4.17 x10E6/uL (ref 3.77–5.28)
RDW: 15.8 % — ABNORMAL HIGH (ref 11.7–15.4)
WBC: 6.7 10*3/uL (ref 3.4–10.8)

## 2020-09-24 LAB — IRON AND TIBC
Iron Saturation: 3 % — CL (ref 15–55)
Iron: 14 ug/dL — ABNORMAL LOW (ref 27–159)
Total Iron Binding Capacity: 412 ug/dL (ref 250–450)
UIBC: 398 ug/dL (ref 131–425)

## 2020-09-24 NOTE — Telephone Encounter (Signed)
Per Mina Marble NP Pt was notified that she will be getting a call from Hematology/Oncology to set up an appointment regarding  anemia that has slightly worsened since last time it was checked. Pt was instructed to  let us know if she does not hear from the hematologist's office in the next couple of days. Pt verbalized understanding.  Eileen Kangas LPN

## 2020-09-24 NOTE — Progress Notes (Signed)
Chief Complaint:   OBESITY [name] is here to discuss her progress with her obesity treatment plan along with follow-up of her obesity related diagnoses. [name] is on the Category 3 Plan and states she is following her eating plan approximately 100% of the time. [name] states she is walking 30-40 minutes 3 times per week.  Today's visit was #: 3 Starting weight: 205 lbs Starting date: 08/22/2020 Today's weight: 204 lbs Today's date: 09/23/2020 Total lbs lost to date: 1 Total lbs lost since last in-office visit: 0  Interim History: 09/05/20- Celestina was started on Wegovy 0.25 mg once a week. She has had one dose so far.  She denies GI upset and reports decreased appetite levels.  Subjective:   1. Polyphagia Burnis was started on Wegovy 0.25 mg on 09/05/2020. She has had 1 dose so far and denies GI upset. She reports a decrease in appetite.  2. Other iron deficiency anemia Evelene has started OTC ferrous sulfate 325 mg, taking 1 tablet every other day. She denies constipation.  She denies SOB. BP/HR stable today.  3. Essential hypertension Connor's BP is stable. She is on Hyzaar 50-12.5 mg QD. She denies fatigue or dizziness with position changes.  BP Readings from Last 3 Encounters:  09/23/20 105/64  09/05/20 130/72  08/29/20 112/76    4. At risk for complication associated with hypotension The patient is at a higher than average risk of hypotension due to weight loss and anti-hypertensive medication.  Assessment/Plan:   1. Polyphagia Intensive lifestyle modifications are the first line treatment for this issue. We discussed several lifestyle modifications today and she will continue to work on diet, exercise and weight loss efforts. Orders and follow up as documented in patient record.  Counseling . Polyphagia is excessive hunger. . Causes can include: low blood sugars, hypERthyroidism, PMS, lack of sleep, stress, insulin resistance, diabetes, certain medications, and  diets that are deficient in protein and fiber.   - Semaglutide-Weight Management 0.25 MG/0.5ML SOAJ; INJECT 0.25 MG INTO THE SKIN ONCE A WEEK.  Dispense: 2 mL; Refill: 0  2. Other iron deficiency anemia Check labs today.  - CBC - Iron Binding Cap (TIBC)([facility])  3. Essential hypertension Jammy is working on healthy weight loss and exercise to improve blood pressure control. We will watch for signs of hypotension as she continues her lifestyle modifications. Monitor for symptoms of hypotension.  4. At risk for complication associated with hypotension Alka was given approximately 15 minutes of education and counseling today to help avoid hypotension. We discussed risks of hypotension with weight loss and signs of hypotension such as feeling lightheaded or unsteady.  Repetitive spaced learning was employed today to elicit superior memory formation and behavioral change.  5. Current BMI 28.5 Makenzi is currently in the action stage of change. As such, her goal is to continue with weight loss efforts. She has agreed to the Category 3 Plan.   Exercise goals: As is  Behavioral modification strategies: increasing lean protein intake, decreasing simple carbohydrates, meal planning and cooking strategies and planning for success.  Aryaa has agreed to follow-up with our clinic in 2 weeks. She was informed of the importance of frequent follow-up visits to maximize her success with intensive lifestyle modifications for her multiple health conditions.   [name] was informed we would discuss her lab results at her next visit unless there is a critical issue that needs to be addressed sooner. Meiah agreed to keep her next visit at the agreed upon time  to discuss these results.  Objective:   Blood pressure 105/64, pulse 68, temperature 98 F (36.7 C), height (P) 5\' 11"  (1.803 m), weight (P) 204 lb (92.5 kg), SpO2 100 %. Body mass index is 28.45 kg/m (pended).  General: Cooperative,  alert, well developed, in no acute distress. HEENT: Conjunctivae and lids unremarkable. Cardiovascular: Regular rhythm.  Lungs: Normal work of breathing. Neurologic: No focal deficits.   Lab Results  Component Value Date   CREATININE 0.78 08/22/2020   BUN 12 08/22/2020   NA 136 08/22/2020   K 4.0 08/22/2020   CL 99 08/22/2020   CO2 20 08/22/2020   Lab Results  Component Value Date   ALT 7 08/22/2020   AST 14 08/22/2020   ALKPHOS 61 08/22/2020   BILITOT <0.2 08/22/2020   Lab Results  Component Value Date   HGBA1C 5.8 (H) 08/22/2020   HGBA1C 5.4 03/15/2019   HGBA1C 5.4 08/23/2017   HGBA1C 5.3 08/22/2016   HGBA1C 5.5 04/20/2016   Lab Results  Component Value Date   INSULIN 7.8 08/22/2020   Lab Results  Component Value Date   TSH 1.250 08/22/2020   Lab Results  Component Value Date   CHOL 174 08/01/2020   HDL 69 08/01/2020   LDLCALC 91 08/01/2020   TRIG 75 08/01/2020   CHOLHDL 2.5 08/01/2020   Lab Results  Component Value Date   WBC 6.7 09/23/2020   HGB 9.9 (L) 09/23/2020   HCT 31.9 (L) 09/23/2020   MCV 77 (L) 09/23/2020   PLT 281 09/23/2020   Lab Results  Component Value Date   IRON 14 (L) 09/23/2020   TIBC 412 09/23/2020     Attestation Statements:   Reviewed by clinician on day of visit: allergies, medications, problem list, medical history, surgical history, family history, social history, and previous encounter notes.  Coral Ceo, am acting as Location manager for Mina Marble, NP.  I have reviewed the above documentation for accuracy and completeness, and I agree with the above. -  Kejuan Bekker d. Arminta Gamm, NP-C

## 2020-09-30 ENCOUNTER — Encounter (INDEPENDENT_AMBULATORY_CARE_PROVIDER_SITE_OTHER): Payer: Self-pay | Admitting: Family Medicine

## 2020-09-30 ENCOUNTER — Other Ambulatory Visit (INDEPENDENT_AMBULATORY_CARE_PROVIDER_SITE_OTHER): Payer: Self-pay | Admitting: Adult Health

## 2020-09-30 ENCOUNTER — Other Ambulatory Visit (HOSPITAL_COMMUNITY): Payer: Self-pay

## 2020-09-30 DIAGNOSIS — R632 Polyphagia: Secondary | ICD-10-CM

## 2020-09-30 MED ORDER — SEMAGLUTIDE-WEIGHT MANAGEMENT 0.25 MG/0.5ML ~~LOC~~ SOAJ
0.2500 mg | SUBCUTANEOUS | 0 refills | Status: DC
  Filled 2020-09-30 – 2020-10-07 (×2): qty 2, 28d supply, fill #0

## 2020-09-30 NOTE — Telephone Encounter (Signed)
Last seen [name] 

## 2020-09-30 NOTE — Telephone Encounter (Signed)
Prescription change request.

## 2020-09-30 NOTE — Telephone Encounter (Signed)
Per pt refill needed prior to return visit.

## 2020-10-01 ENCOUNTER — Other Ambulatory Visit (HOSPITAL_COMMUNITY): Payer: Self-pay

## 2020-10-07 ENCOUNTER — Encounter (INDEPENDENT_AMBULATORY_CARE_PROVIDER_SITE_OTHER): Payer: Self-pay

## 2020-10-07 ENCOUNTER — Other Ambulatory Visit (HOSPITAL_COMMUNITY): Payer: Self-pay

## 2020-10-07 ENCOUNTER — Other Ambulatory Visit (INDEPENDENT_AMBULATORY_CARE_PROVIDER_SITE_OTHER): Payer: Self-pay | Admitting: Adult Health

## 2020-10-07 DIAGNOSIS — R632 Polyphagia: Secondary | ICD-10-CM

## 2020-10-07 NOTE — Telephone Encounter (Signed)
Last seen by Katy 

## 2020-10-09 ENCOUNTER — Ambulatory Visit (INDEPENDENT_AMBULATORY_CARE_PROVIDER_SITE_OTHER): Payer: No Typology Code available for payment source | Admitting: Specialist

## 2020-10-09 ENCOUNTER — Encounter (HOSPITAL_COMMUNITY): Payer: Self-pay

## 2020-10-09 ENCOUNTER — Encounter: Payer: Self-pay | Admitting: Specialist

## 2020-10-09 ENCOUNTER — Other Ambulatory Visit: Payer: Self-pay

## 2020-10-09 VITALS — BP 137/87 | HR 82 | Ht 71.0 in | Wt 204.0 lb

## 2020-10-09 DIAGNOSIS — M549 Dorsalgia, unspecified: Secondary | ICD-10-CM

## 2020-10-09 DIAGNOSIS — M5116 Intervertebral disc disorders with radiculopathy, lumbar region: Secondary | ICD-10-CM | POA: Diagnosis not present

## 2020-10-09 DIAGNOSIS — M5430 Sciatica, unspecified side: Secondary | ICD-10-CM

## 2020-10-09 DIAGNOSIS — M5136 Other intervertebral disc degeneration, lumbar region: Secondary | ICD-10-CM | POA: Diagnosis not present

## 2020-10-09 DIAGNOSIS — M533 Sacrococcygeal disorders, not elsewhere classified: Secondary | ICD-10-CM | POA: Diagnosis not present

## 2020-10-09 NOTE — Progress Notes (Signed)
Office Visit Note   Patient: [name]           Date of Birth: [birth year]           MRN: [id number] Visit Date: 10/09/2020              Requested by: Fayrene Helper, MD [address] [place],  [place] [address] PCP: Fayrene Helper, MD   Assessment & Plan: Visit Diagnoses:  1. Lumbar disc herniation with radiculopathy   2. Back pain with sciatica   3. Sacroiliac joint disease   4. Degenerative disc disease, lumbar     Plan: Avoid frequent bending and stooping  No lifting greater than 10 lbs. May use ice or moist heat for pain. Weight loss is of benefit. Best medication for lumbar disc disease is arthritis medications like motrin, celebrex and naprosyn. Exercise is important to improve your indurance and does allow people to function better inspite of back pain.    Follow-Up Instructions: No follow-ups on file.   Orders:  No orders of the defined types were placed in this encounter.  No orders of the defined types were placed in this encounter.     Procedures: No procedures performed   Clinical Data: No additional findings.   Subjective: Chief Complaint  Patient presents with  . Lower Back - Follow-up    She had a Lt L3-4 IL injection with Dr. Ernestina Patches on 08/29/20, she states that she got 98% relief, she states that she feels great, she is a little stiff today but the weather is really cold this morning.    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 She has done well with ESI and has good relief of pain. Now taking tylenol 2 BID and cyclobenzaprine. Working full time.   Review of Systems  Constitutional: Negative.   HENT: Negative.   Eyes: Negative.   Respiratory: Negative.   Cardiovascular: Negative.   Gastrointestinal: Negative.   Endocrine: Negative.   Genitourinary: Negative.   Musculoskeletal: Negative.   Skin: Negative.   Allergic/Immunologic: Negative.   Neurological: Negative.    Hematological: Negative.   Psychiatric/Behavioral: Negative.      Objective: Vital Signs: BP 137/87 (BP Location: Left Arm, Patient Position: Sitting)   Pulse 82   Ht 5\' 11"  (1.803 m)   Wt 204 lb (92.5 kg)   BMI 28.45 kg/m   Physical Exam Constitutional:      Appearance: She is well-developed.  HENT:     Head: Normocephalic and atraumatic.  Eyes:     Pupils: Pupils are equal, round, and reactive to light.  Pulmonary:     Effort: Pulmonary effort is normal.     Breath sounds: Normal breath sounds.  Abdominal:     General: Bowel sounds are normal.     Palpations: Abdomen is soft.  Musculoskeletal:        General: Normal range of motion.     Cervical back: Normal range of motion and neck supple.     Lumbar back: Negative right straight leg raise test and negative left straight leg raise test.  Skin:    General: Skin is warm and dry.  Neurological:     Mental Status: She is alert and oriented to person, place, and time.  Psychiatric:        Behavior: Behavior normal.        Thought Content: Thought content normal.  Judgment: Judgment normal.     Back Exam   Tenderness  The patient is experiencing tenderness in the lumbar.  Muscle Strength  Right Quadriceps:  5/5  Left Quadriceps:  5/5  Right Hamstrings:  5/5  Left Hamstrings:  5/5   Tests  Straight leg raise right: negative Straight leg raise left: negative  Reflexes  Patellar: 0/4 Achilles: 0/4      Specialty Comments:  No specialty comments available.  Imaging: No results found.   PMFS History: Patient Active Problem List   Diagnosis Date Noted  . Abnormal CT scan, pelvis 04/01/2020  . Need for Tdap vaccination 12/06/2019  . Symptomatic varicose veins, bilateral 09/01/2018  . Dyspepsia 01/18/2017  . Back pain with sciatica 09/20/2014  . Solitary lung nodule 09/05/2014  . Hyperlipidemia LDL goal <100 01/26/2014  . Overweight (BMI 25.0-29.9) 01/26/2014  . Allergic rhinitis  09/08/2010  . OTH ABNORMAL BRAIN & CNS FUNCTION STUDY 02/16/2010  . GOITER, UNSPECIFIED 02/10/2010  . Essential hypertension, benign 05/02/2009   Past Medical History:  Diagnosis Date  . Arthritis    Phreesia 03/29/2020  . Back pain   . DDD (degenerative disc disease), lumbar   . Diabetes mellitus without complication ([place])    Phreesia 03/29/2020  . Essential hypertension, benign   . GERD (gastroesophageal reflux disease)   . Hypertension    Phreesia 03/29/2020  . Joint pain   . Lactose intolerance   . Lumbar herniated disc   . Migraine headache 2007  . Obesity    Since childhood   . Osteoarthritis   . Pneumonia 2007  . Sacroiliac joint disease   . Spinal disease   . Supraventricular tachycardia [facility])    Status post RFA July 2015 - Dr. Lovena Le  . Type 2 diabetes mellitus ([place])   . Varicose vein of leg   . Vitamin D deficiency   . Weight loss    Hopitalized 4 years ago after loosing 200lb on a supervised program, malnurished with severe protein deficiency     Family History  Problem Relation Age of Onset  . Hypertension Mother   . Diabetes Mother   . Cancer Mother        Uterine   . Heart failure Mother        CABG  . Hyperlipidemia Mother   . Heart disease Mother   . Thyroid disease Mother   . Cancer Father 91       Prostate   . Hypertension Father   . Bradycardia Father        Irregular heart beat   . Hyperlipidemia Father   . Kidney disease Father   . Diabetes Brother   . Diabetes Brother   . Hypertension Brother   . Hypertension Brother     Past Surgical History:  Procedure Laterality Date  . ABLATION  12-28-2013   slow pathway modification of AVNRT by Dr Lovena Le  . COLONOSCOPY N/A 11/17/2019   Procedure: COLONOSCOPY;  Surgeon: Daneil Dolin, MD;  Location: [facility];  Service: Endoscopy;  Laterality: N/A;  7:30  . ELECTROPHYSIOLOGY STUDY N/A 12/28/2013   Procedure: ELECTROPHYSIOLOGY STUDY;  Surgeon: Evans Lance, MD;  Location: [facility] CATH LAB;   Service: Cardiovascular;  Laterality: N/A;  . SUPRAVENTRICULAR TACHYCARDIA ABLATION N/A 12/28/2013   Procedure: SUPRAVENTRICULAR TACHYCARDIA ABLATION;  Surgeon: Evans Lance, MD;  Location: [facility] CATH LAB;  Service: Cardiovascular;  Laterality: N/A;  . TUBAL LIGATION  1996   Social History   Occupational History  .  Occupation: English as a second language teacher at [employer]  . Smoking status: Former Smoker    Packs/day: 0.50    Years: 7.00    Pack years: 3.50    Types: Cigarettes    Quit date: 08/05/2012    Years since quitting: 8.1  . Smokeless tobacco: Never Used  . Tobacco comment: quit in 2014  Vaping Use  . Vaping Use: Never used  Substance and Sexual Activity  . Alcohol use: No    Alcohol/week: 0.0 standard drinks  . Drug use: No  . Sexual activity: Yes    Birth control/protection: None, Surgical    Comment: tubal ligation

## 2020-10-10 ENCOUNTER — Inpatient Hospital Stay (HOSPITAL_COMMUNITY): Payer: No Typology Code available for payment source

## 2020-10-10 ENCOUNTER — Inpatient Hospital Stay (HOSPITAL_COMMUNITY): Payer: No Typology Code available for payment source | Attending: Hematology | Admitting: Hematology

## 2020-10-10 ENCOUNTER — Other Ambulatory Visit: Payer: Self-pay

## 2020-10-10 VITALS — BP 123/63 | HR 69 | Temp 98.3°F | Resp 18 | Wt 206.1 lb

## 2020-10-10 DIAGNOSIS — D509 Iron deficiency anemia, unspecified: Secondary | ICD-10-CM | POA: Insufficient documentation

## 2020-10-10 DIAGNOSIS — D500 Iron deficiency anemia secondary to blood loss (chronic): Secondary | ICD-10-CM

## 2020-10-10 LAB — CBC WITH DIFFERENTIAL/PLATELET
Abs Immature Granulocytes: 0.02 10*3/uL (ref 0.00–0.07)
Basophils Absolute: 0.1 10*3/uL (ref 0.0–0.1)
Basophils Relative: 1 %
Eosinophils Absolute: 0.2 10*3/uL (ref 0.0–0.5)
Eosinophils Relative: 3 %
HCT: 33.5 % — ABNORMAL LOW (ref 36.0–46.0)
Hemoglobin: 10.1 g/dL — ABNORMAL LOW (ref 12.0–15.0)
Immature Granulocytes: 0 %
Lymphocytes Relative: 66 %
Lymphs Abs: 5.6 10*3/uL — ABNORMAL HIGH (ref 0.7–4.0)
MCH: 23.9 pg — ABNORMAL LOW (ref 26.0–34.0)
MCHC: 30.1 g/dL (ref 30.0–36.0)
MCV: 79.2 fL — ABNORMAL LOW (ref 80.0–100.0)
Monocytes Absolute: 0.8 10*3/uL (ref 0.1–1.0)
Monocytes Relative: 9 %
Neutro Abs: 1.8 10*3/uL (ref 1.7–7.7)
Neutrophils Relative %: 21 %
Platelets: 305 10*3/uL (ref 150–400)
RBC: 4.23 MIL/uL (ref 3.87–5.11)
RDW: 16.8 % — ABNORMAL HIGH (ref 11.5–15.5)
WBC: 8.5 10*3/uL (ref 4.0–10.5)
nRBC: 0 % (ref 0.0–0.2)

## 2020-10-10 LAB — FOLATE: Folate: 6.6 ng/mL (ref >5.9)

## 2020-10-10 LAB — IRON AND TIBC
Iron: 73 ug/dL (ref 28–170)
Saturation Ratios: 15 % (ref 10.4–31.8)
TIBC: 479 ug/dL — ABNORMAL HIGH (ref 250–450)
UIBC: 406 ug/dL

## 2020-10-10 LAB — FERRITIN: Ferritin: 8 ng/mL — ABNORMAL LOW (ref 11–307)

## 2020-10-10 LAB — LACTATE DEHYDROGENASE: LDH: 123 U/L (ref 98–192)

## 2020-10-10 NOTE — Progress Notes (Signed)
[place], [place] [address]   CLINIC:  Medical Oncology/Hematology  CONSULT NOTE  Patient Care Team: Fayrene Helper, MD as PCP - General Domenic Polite Aloha Gell, MD as Consulting Physician (Cardiology) Gala Romney Cristopher Estimable, MD as Consulting Physician (Gastroenterology)  CHIEF COMPLAINTS/PURPOSE OF CONSULTATION:  Iron deficiency anemia  HISTORY OF PRESENTING ILLNESS:  [name] 50 y.o. female is here at the request of her Weight and Wellness NP Mina Marble due to iron deficiency anemia.  Labs sent by referring provider show hemoglobin 9.9 with an MCV of 77 (09/23/2020), which has trended down over the past year with previously normal hemoglobin 12.0 (12/07/2019).  Patient has serum iron of 14 with 3% iron saturation.  Vitamin D within normal limits at 38.5, vitamin B12 within normal limits at 556.  CMP (08/22/2020) showed normal creatinine 0.78, normal liver function.  She has been feeling fatigued for the past 2 months, rates her energy as about 40% of normal.  She has been having feelings of lightheadedness.  She denies recent chest pain on exertion, shortness of breath on minimal exertion, pre-syncopal episodes, or palpitations. She had not noticed any recent bleeding such as epistaxis, hematuria, hematochezia, or melena. She has chronic menorrhagia, but her periods have been more irregular now that she is in the perimenopause. The patient takes once daily Voltaren tablet for arthritis.  She does not take any other NSAIDs. She is not on antiplatelets agents.  She takes a daily Pepcid with her Voltaren tablet due to stomach discomfort. She has no known history of GI bleeding or ulcers.   She has been craving to eat dirt for the past month. Her last colonoscopy was screening colonoscopy on 11/17/2019, which showed normal colon throughout. She has never had an EGD.   She had no prior history or diagnosis of cancer. Her age appropriate screening  programs are up-to-date. The patient was prescribed oral iron supplements and she has been takinges oral iron tablets every other day for the pas tmonth, but has failed to see improvement in her lab values.  Her other past medical history is significant for chronic back pain, history of SVT s/p ablation, hypertension, diet-controlled diabetes mellitus type 2.  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  She is former smoker (10 pack-year history), quit over 10 years ago.  She denies alcohol or drug use.  Patient's mother had uterine caner, father with prostate cancer.  No other known family history of anemia or cancers.   MEDICAL HISTORY:  Past Medical History:  Diagnosis Date  . Arthritis    Phreesia 03/29/2020  . Back pain   . DDD (degenerative disc disease), lumbar   . Diabetes mellitus without complication ([place])    Phreesia 03/29/2020  . Essential hypertension, benign   . GERD (gastroesophageal reflux disease)   . Hypertension    Phreesia 03/29/2020  . Joint pain   . Lactose intolerance   . Lumbar herniated disc   . Migraine headache 2007  . Obesity    Since childhood   . Osteoarthritis   . Pneumonia 2007  . Sacroiliac joint disease   . Spinal disease   . Supraventricular tachycardia [facility])    Status post RFA July 2015 - Dr. Lovena Le  . Type 2 diabetes mellitus ([place])   . Varicose vein of leg   . Vitamin D deficiency   . Weight loss    Hopitalized 4 years ago after loosing 200lb on a  supervised program, malnurished with severe protein deficiency     SURGICAL HISTORY: Past Surgical History:  Procedure Laterality Date  . ABLATION  12-28-2013   slow pathway modification of AVNRT by Dr Lovena Le  . COLONOSCOPY N/A 11/17/2019   Procedure: COLONOSCOPY;  Surgeon: Daneil Dolin, MD;  Location[facility];  Service: Endoscopy;  Laterality: N/A;  7:30  . ELECTROPHYSIOLOGY STUDY N/A 12/28/2013   Procedure: ELECTROPHYSIOLOGY STUDY;  Surgeon: Evans Lance, MD;  Location: [facility] CATH LAB;   Service: Cardiovascular;  Laterality: N/A;  . SUPRAVENTRICULAR TACHYCARDIA ABLATION N/A 12/28/2013   Procedure: SUPRAVENTRICULAR TACHYCARDIA ABLATION;  Surgeon: Evans Lance, MD;  Location: [facility] CATH LAB;  Service: Cardiovascular;  Laterality: N/A;  . TUBAL LIGATION  1996    SOCIAL HISTORY: Social History   Socioeconomic History  . Marital status: Married    Spouse name: Not on file  . Number of children: 2  . Years of education: Not on file  . Highest education level: Not on file  Occupational History  . Occupation: English as a second language teacher at [employer]  . Smoking status: Former Smoker    Packs/day: 0.50    Years: 7.00    Pack years: 3.50    Types: Cigarettes    Quit date: 08/05/2012    Years since quitting: 8.2  . Smokeless tobacco: Never Used  . Tobacco comment: quit in 2014  Vaping Use  . Vaping Use: Never used  Substance and Sexual Activity  . Alcohol use: No    Alcohol/week: 0.0 standard drinks  . Drug use: No  . Sexual activity: Yes    Birth control/protection: None, Surgical    Comment: tubal ligation  Other Topics Concern  . Not on file  Social History Narrative  . Not on file   Social Determinants of Health   Financial Resource Strain: Low Risk   . Difficulty of Paying Living Expenses: Not very hard  Food Insecurity: No Food Insecurity  . Worried About Charity fundraiser in the Last Year: Never true  . Ran Out of Food in the Last Year: Never true  Transportation Needs: No Transportation Needs  . Lack of Transportation (Medical): No  . Lack of Transportation (Non-Medical): No  Physical Activity: Inactive  . Days of Exercise per Week: 0 days  . Minutes of Exercise per Session: 0 min  Stress: No Stress Concern Present  . Feeling of Stress : Not at all  Social Connections: Moderately Integrated  . Frequency of Communication with Friends and Family: Three times a week  . Frequency of Social Gatherings with Friends and Family: Three times a week  . Attends  Religious Services: More than 4 times per year  . Active Member of Clubs or Organizations: No  . Attends Archivist Meetings: Never  . Marital Status: Married  Human resources officer Violence: Not At Risk  . Fear of Current or Ex-Partner: No  . Emotionally Abused: No  . Physically Abused: No  . Sexually Abused: No    FAMILY HISTORY: Family History  Problem Relation Age of Onset  . Hypertension Mother   . Diabetes Mother   . Cancer Mother        Uterine   . Heart failure Mother        CABG  . Hyperlipidemia Mother   . Heart disease Mother   . Thyroid disease Mother   . Cancer Father 2       Prostate   . Hypertension Father   .  Bradycardia Father        Irregular heart beat   . Hyperlipidemia Father   . Kidney disease Father   . Diabetes Brother   . Diabetes Brother   . Hypertension Brother   . Hypertension Brother     ALLERGIES:  is allergic to other, codeine, and metoclopramide hcl.  MEDICATIONS:  Current Outpatient Medications  Medication Sig Dispense Refill  . ferrous sulfate 325 (65 FE) MG tablet Take 325 mg by mouth every other day.    Marland Kitchen acetaminophen (TYLENOL) 500 MG tablet Take 1,000 mg by mouth every 6 (six) hours as needed (for pain).    . Cholecalciferol 25 MCG (1000 UT) tablet Take 1,000 Units by mouth daily.     . cyclobenzaprine (FLEXERIL) 10 MG tablet TAKE 1 TABLET BY MOUTH AT BEDTIME 90 tablet 0  . cyclobenzaprine (FLEXERIL) 10 MG tablet TAKE 1 TABLET BY MOUTH AT BEDTIME 90 tablet 0  . Diclofenac-miSOPROStol 50-0.2 MG TBEC TAKE 1 TABLET BY MOUTH TWICE DAILY AFTER A MEAL 60 tablet 3  . famotidine (PEPCID) 40 MG tablet TAKE ONE TABLET ONCE DAILY WHEN YOU TAKE IBUPROFEN 90 tablet 0  . gabapentin (NEURONTIN) 100 MG capsule TAKE ONE TO TWO CAPSULES BY  MOUTH AT BEDTIME FOR BACK PAIN 60 capsule 3  . losartan-hydrochlorothiazide (HYZAAR) 50-12.5 MG tablet TAKE 1 TABLET BY MOUTH ONCE DAILY 90 tablet 3  . Semaglutide-Weight Management 0.25 MG/0.5ML SOAJ  INJECT 0.25 MG INTO THE SKIN ONCE A WEEK. 2 mL 0   No current facility-administered medications for this visit.    REVIEW OF SYSTEMS:   Review of Systems  Constitutional: Positive for fatigue. Negative for appetite change, chills, diaphoresis, fever and unexpected weight change.  HENT:   Negative for lump/mass and nosebleeds.   Eyes: Negative for eye problems.  Respiratory: Negative for cough, hemoptysis and shortness of breath.   Cardiovascular: Negative for chest pain, leg swelling and palpitations.  Gastrointestinal: Negative for abdominal pain, blood in stool, constipation, diarrhea, nausea and vomiting.  Genitourinary: Negative for hematuria.   Musculoskeletal: Positive for back pain.  Skin: Negative.   Neurological: Positive for light-headedness. Negative for dizziness and headaches.  Hematological: Does not bruise/bleed easily.      PHYSICAL EXAMINATION: ECOG PERFORMANCE STATUS: 1 - Symptomatic but completely ambulatory  Vitals:   10/10/20 0905  BP: 123/63  Pulse: 69  Resp: 18  Temp: 98.3 F (36.8 C)   Filed Weights   10/10/20 0905  Weight: 206 lb 2.1 oz (93.5 kg)    Physical Exam Constitutional:      Appearance: Normal appearance.  HENT:     Head: Normocephalic and atraumatic.     Mouth/Throat:     Mouth: Mucous membranes are moist.  Eyes:     Extraocular Movements: Extraocular movements intact.     Pupils: Pupils are equal, round, and reactive to light.  Cardiovascular:     Rate and Rhythm: Normal rate and regular rhythm.     Pulses: Normal pulses.     Heart sounds: Normal heart sounds.  Pulmonary:     Effort: Pulmonary effort is normal.     Breath sounds: Normal breath sounds.  Abdominal:     General: Bowel sounds are normal.     Palpations: Abdomen is soft.     Tenderness: There is no abdominal tenderness.  Musculoskeletal:        General: No swelling.     Right lower leg: No edema.     Left lower leg: No  edema.  Lymphadenopathy:     Cervical:  No cervical adenopathy.  Skin:    General: Skin is warm and dry.  Neurological:     General: No focal deficit present.     Mental Status: She is alert and oriented to person, place, and time.  Psychiatric:        Mood and Affect: Mood normal.        Behavior: Behavior normal.       LABORATORY DATA:  I have reviewed the data as listed Recent Results (from the past 2160 hour(s))  Lipid panel     Status: None   Collection Time: 08/01/20  8:18 AM  Result Value Ref Range   Cholesterol, Total 174 100 - 199 mg/dL   Triglycerides 75 0 - 149 mg/dL   HDL 69 >39 mg/dL   VLDL Cholesterol Cal 14 5 - 40 mg/dL   LDL Chol Calc (NIH) 91 0 - 99 mg/dL   Chol/HDL Ratio 2.5 0.0 - 4.4 ratio    Comment:                                   T. Chol/HDL Ratio                                             Men  Women                               1/2 Avg.Risk  3.4    3.3                                   Avg.Risk  5.0    4.4                                2X Avg.Risk  9.6    7.1                                3X Avg.Risk 23.4   11.0   BMP8+eGFR     Status: None   Collection Time: 08/01/20  8:18 AM  Result Value Ref Range   Glucose 85 65 - 99 mg/dL   BUN 14 6 - 24 mg/dL   Creatinine, Ser 0.93 0.57 - 1.00 mg/dL   GFR calc non Af Amer 72 >59 mL/min/1.73   GFR calc Af Amer 83 >59 mL/min/1.73    Comment: **In accordance with recommendations from the NKF-ASN Task force,**   [facility] is in the process of updating its eGFR calculation to the   2021 CKD-EPI creatinine equation that estimates kidney function   without a race variable.    BUN/Creatinine Ratio 15 9 - 23   Sodium 140 134 - 144 mmol/L   Potassium 4.2 3.5 - 5.2 mmol/L   Chloride 101 96 - 106 mmol/L   CO2 22 20 - 29 mmol/L   Calcium 9.5 8.7 - 10.2 mg/dL  CBC with Differential/Platelet     Status: Abnormal   Collection Time: 08/22/20  9:56 AM  Result Value Ref Range   WBC 8.1  3.4 - 10.8 x10E3/uL   RBC 4.55 3.77 - 5.28 x10E6/uL   Hemoglobin  10.8 (L) 11.1 - 15.9 g/dL   Hematocrit 35.0 34.0 - 46.6 %   MCV 77 (L) 79 - 97 fL   MCH 23.7 (L) 26.6 - 33.0 pg   MCHC 30.9 (L) 31.5 - 35.7 g/dL   RDW 14.8 11.7 - 15.4 %   Platelets 320 150 - 450 x10E3/uL   Neutrophils 28 Not Estab. %   Lymphs 60 Not Estab. %   Monocytes 9 Not Estab. %   Eos 2 Not Estab. %   Basos 1 Not Estab. %   Neutrophils Absolute 2.3 1.4 - 7.0 x10E3/uL   Lymphocytes Absolute 5.0 (H) 0.7 - 3.1 x10E3/uL   Monocytes Absolute 0.7 0.1 - 0.9 x10E3/uL   EOS (ABSOLUTE) 0.1 0.0 - 0.4 x10E3/uL   Basophils Absolute 0.1 0.0 - 0.2 x10E3/uL   Immature Granulocytes 0 Not Estab. %   Immature Grans (Abs) 0.0 0.0 - 0.1 x10E3/uL  Comprehensive metabolic panel     Status: None   Collection Time: 08/22/20  9:56 AM  Result Value Ref Range   Glucose 82 65 - 99 mg/dL   BUN 12 6 - 24 mg/dL   Creatinine, Ser 0.78 0.57 - 1.00 mg/dL   eGFR 92 >59 mL/min/1.73    Comment: **In accordance with recommendations from the NKF-ASN Task force,**   [facility] has updated its eGFR calculation to the 2021 CKD-EPI   creatinine equation that estimates kidney function without a race   variable.    BUN/Creatinine Ratio 15 9 - 23   Sodium 136 134 - 144 mmol/L   Potassium 4.0 3.5 - 5.2 mmol/L   Chloride 99 96 - 106 mmol/L   CO2 20 20 - 29 mmol/L   Calcium 9.2 8.7 - 10.2 mg/dL   Total Protein 7.5 6.0 - 8.5 g/dL   Albumin 4.6 3.8 - 4.8 g/dL   Globulin, Total 2.9 1.5 - 4.5 g/dL   Albumin/Globulin Ratio 1.6 1.2 - 2.2   Bilirubin Total <0.2 0.0 - 1.2 mg/dL   Alkaline Phosphatase 61 44 - 121 IU/L   AST 14 0 - 40 IU/L   ALT 7 0 - 32 IU/L  Insulin, random     Status: None   Collection Time: 08/22/20  9:56 AM  Result Value Ref Range   INSULIN 7.8 2.6 - 24.9 uIU/mL  Hemoglobin A1c     Status: Abnormal   Collection Time: 08/22/20  9:56 AM  Result Value Ref Range   Hgb A1c MFr Bld 5.8 (H) 4.8 - 5.6 %    Comment:          Prediabetes: 5.7 - 6.4          Diabetes: >6.4          Glycemic control for  adults with diabetes: <7.0    Est. average glucose Bld gHb Est-mCnc 120 mg/dL  VITAMIN D 25 Hydroxy (Vit-D Deficiency, Fractures)     Status: None   Collection Time: 08/22/20  9:56 AM  Result Value Ref Range   Vit D, 25-Hydroxy 38.5 30.0 - 100.0 ng/mL    Comment: Vitamin D deficiency has been defined by the Institute of Medicine and an Endocrine Society practice guideline as a level of serum 25-OH vitamin D less than 20 ng/mL (1,2). The Endocrine Society went on to further define vitamin D insufficiency as a level between 21 and 29 ng/mL (2). 1. IOM (Institute of Medicine). 2010. Dietary  reference    intakes for calcium and D. [place]: The    [employer]. 2. Holick MF, Binkley [place], Bischoff-Ferrari HA, et al.    Evaluation, treatment, and prevention of vitamin D    deficiency: an Endocrine Society clinical practice    guideline. JCEM. 2011 Jul; 96(7):1911-30.   TSH     Status: None   Collection Time: 08/22/20  9:56 AM  Result Value Ref Range   TSH 1.250 0.450 - 4.500 uIU/mL  T4, free     Status: None   Collection Time: 08/22/20  9:56 AM  Result Value Ref Range   Free T4 1.34 0.82 - 1.77 ng/dL  Vitamin B12     Status: None   Collection Time: 08/22/20  9:56 AM  Result Value Ref Range   Vitamin B-12 556 232 - 1,245 pg/mL  CBC     Status: Abnormal   Collection Time: 09/23/20  3:01 PM  Result Value Ref Range   WBC 6.7 3.4 - 10.8 x10E3/uL   RBC 4.17 3.77 - 5.28 x10E6/uL   Hemoglobin 9.9 (L) 11.1 - 15.9 g/dL   Hematocrit 31.9 (L) 34.0 - 46.6 %   MCV 77 (L) 79 - 97 fL   MCH 23.7 (L) 26.6 - 33.0 pg   MCHC 31.0 (L) 31.5 - 35.7 g/dL   RDW 15.8 (H) 11.7 - 15.4 %   Platelets 281 150 - 450 x10E3/uL  Iron Binding Cap (TIBC)([facility])     Status: Abnormal   Collection Time: 09/23/20  3:01 PM  Result Value Ref Range   Total Iron Binding Capacity 412 250 - 450 ug/dL   UIBC 398 131 - 425 ug/dL   Iron 14 (L) 27 - 159 ug/dL   Iron Saturation 3 (LL) 15 - 55 %   Methylmalonic acid, serum     Status: None   Collection Time: 10/10/20 10:22 AM  Result Value Ref Range   Methylmalonic Acid, Quantitative 96 0 - 378 nmol/L    Comment: (NOTE) This test was developed and its performance characteristics determined by [facility]. It has not been cleared or approved by the Food and Drug Administration. Performed At: [facility] [address] [place], [place] [id number] Rush Farmer MD [id number]   Copper, serum     Status: Abnormal   Collection Time: 10/10/20 10:22 AM  Result Value Ref Range   Copper 177 (H) 80 - 158 ug/dL    Comment: (NOTE) This test was developed and its performance characteristics determined by [facility]. It has not been cleared or approved by the Food and Drug Administration.                                Detection Limit = 5 Performed At: [place], [place] [id number] Rush Farmer MD [id number]   Folate     Status: None   Collection Time: 10/10/20 10:22 AM  Result Value Ref Range   Folate 6.6 >5.9 ng/mL    Comment: Performed at [facility], [address] [place] [address], [place], [place] [address]  CBC with Differential/Platelet     Status: Abnormal   Collection Time: 10/10/20 10:22 AM  Result Value Ref Range   WBC 8.5 4.0 - 10.5 K/uL   RBC 4.23 3.87 - 5.11 MIL/uL   Hemoglobin 10.1 (L) 12.0 - 15.0 g/dL   HCT 33.5 (L) 36.0 - 46.0 %   MCV 79.2 (L) 80.0 - 100.0  fL   MCH 23.9 (L) 26.0 - 34.0 pg   MCHC 30.1 30.0 - 36.0 g/dL   RDW 16.8 (H) 11.5 - 15.5 %   Platelets 305 150 - 400 K/uL   nRBC 0.0 0.0 - 0.2 %   Neutrophils Relative % 21 %   Neutro Abs 1.8 1.7 - 7.7 K/uL   Lymphocytes Relative 66 %   Lymphs Abs 5.6 (H) 0.7 - 4.0 K/uL   Monocytes Relative 9 %   Monocytes Absolute 0.8 0.1 - 1.0 K/uL   Eosinophils Relative 3 %   Eosinophils Absolute 0.2 0.0 - 0.5 K/uL   Basophils Relative 1 %   Basophils Absolute 0.1 0.0 - 0.1 K/uL   RBC Morphology ANISOCYTOSIS    Immature Granulocytes 0  %   Abs Immature Granulocytes 0.02 0.00 - 0.07 K/uL   Reactive, Benign Lymphocytes PRESENT    Polychromasia PRESENT     Comment: Performed at [facility], [address] [place], [place] [address]  Ferritin     Status: Abnormal   Collection Time: 10/10/20 10:22 AM  Result Value Ref Range   Ferritin 8 (L) 11 - 307 ng/mL    Comment: Performed at [facility], [address], [place], [place] [address]  Iron and TIBC     Status: Abnormal   Collection Time: 10/10/20 10:22 AM  Result Value Ref Range   Iron 73 28 - 170 ug/dL   TIBC 479 (H) 250 - 450 ug/dL   Saturation Ratios 15 10.4 - 31.8 %   UIBC 406 ug/dL    Comment: Performed at [facility], [address] [place], [place] [address]  Lactate dehydrogenase     Status: None   Collection Time: 10/10/20 10:22 AM  Result Value Ref Range   LDH 123 98 - 192 U/L    Comment: Performed at [facility], [address] [place], [place] [address]  Immunofixation electrophoresis     Status: None   Collection Time: 10/10/20 10:22 AM  Result Value Ref Range   Total Protein ELP 7.8 6.0 - 8.5 g/dL   IgG (Immunoglobin G), Serum 1,534 586 - 1,602 mg/dL   IgA 262 87 - 352 mg/dL   IgM (Immunoglobulin M), Srm 91 26 - 217 mg/dL    Comment: (NOTE) Performed At: [facility] [place], [place] [id number] Rush Farmer MD GY:[id number]    Immunofixation Result, Serum Comment     Comment: (NOTE) The immunofixation pattern appears unremarkable. Evidence of monoclonal protein is not apparent.   Protein electrophoresis, serum     Status: None   Collection Time: 10/10/20 10:22 AM  Result Value Ref Range   Total Protein ELP 7.4 6.0 - 8.5 g/dL   Albumin ELP 3.8 2.9 - 4.4 g/dL   Alpha-1-Globulin 0.3 0.0 - 0.4 g/dL   Alpha-2-Globulin 0.8 0.4 - 1.0 g/dL   Beta Globulin 1.0 0.7 - 1.3 g/dL   Gamma Globulin 1.5 0.4 - 1.8 g/dL   M-Spike, % Not Observed Not Observed g/dL   SPE Interp. Comment     Comment: (NOTE) The SPE pattern appears  unremarkable. Evidence of monoclonal protein is not apparent. Performed At: [facility] [place], [place] [id number] Rush Farmer MD [id number]    Comment Comment     Comment: (NOTE) Protein electrophoresis scan will follow via computer, mail, or courier delivery.    Globulin, Total 3.6 2.2 - 3.9 g/dL   A/G Ratio 1.1 0.7 - 1.7  Kappa/lambda light chains  Status: Abnormal   Collection Time: 10/10/20 10:22 AM  Result Value Ref Range   Kappa free light chain 34.4 (H) 3.3 - 19.4 mg/L   Lamda free light chains 24.8 5.7 - 26.3 mg/L   Kappa, lamda light chain ratio 1.39 0.26 - 1.65    Comment: (NOTE) Performed At: [facility] [address] [place], [place] [id number] Rush Farmer MD OE:[id number]   Occult blood x 1 card to lab, stool     Status: None   Collection Time: 10/14/20  8:28 AM  Result Value Ref Range   Fecal Occult Bld NEGATIVE NEGATIVE    Comment: Performed at [facility], [address] [place], [place] [address]  Occult blood x 1 card to lab, stool     Status: None   Collection Time: 10/14/20  8:28 AM  Result Value Ref Range   Fecal Occult Bld NEGATIVE NEGATIVE    Comment: Performed at [facility], [address], [place], [place] [address]  Occult blood x 1 card to lab, stool     Status: None   Collection Time: 10/14/20  8:29 AM  Result Value Ref Range   Fecal Occult Bld NEGATIVE NEGATIVE    Comment: Performed at [facility], [address], [place], [place] [address]    RADIOGRAPHIC STUDIES: I have personally reviewed the radiological images as listed and agreed with the findings in the report. No results found.  ASSESSMENT: 1.  Iron deficiency anemia - Labs sent by referring provider show hemoglobin 9.9 with an MCV of 77 (09/23/2020), which has trended down over the past year with previously normal hemoglobin 12.0 (12/07/2019).   - Serum iron of 14 with 3% iron saturation.  Vitamin D within normal limits at 38.5,  vitamin B12 within normal limits at 556.   - CMP (08/22/2020) showed normal creatinine 0.78, normal liver function. - Normal screening colonoscopy in May 2021; no history of EGD - Denies signs or symptoms of GI bleeding, but does have chronic menorrhagia - Possible malabsorption in the setting of PPI use, in addition to chronic blood loss from menorrhagia - Failed to improve on oral iron tablets - Symptomatic with fatigue, pica, lightheadedness   2.  Other history - Her other past medical history is significant for chronic back pain, history of SVT s/p ablation, hypertension, diet-controlled diabetes mellitus type 2. 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  She is former smoker (10 pack-year history), quit over 10 years ago.  She denies alcohol or drug use. - Patient's mother had uterine caner, father with prostate cancer.  No other known family history of anemia or cancers.   PLAN:  1.  Iron deficiency anemia - No signs or symptoms of GI blood loss, will check stool occult blood cards x3 to rule out occult GI bleed -Repeat CBC, check ferritin, iron, TIBC; copper, methylmalonic acid, folate, LDH - We will defer vitamin D and vitamin B12, as those were recently checked and were normal - We will check MGUS/myeloma panel - Schedule patient for IV iron infusion (Venofer 300 mg/300 mg/400 mg) - RTC in 2 weeks to discuss results and next steps  PLAN SUMMARY & DISPOSITION: -Labs today -Stool cards x 3 -IV Venofer x3 (300/300/400) -RTC in 2-3 weeks to discuss lab results  All questions were answered. The patient knows to call the clinic with any problems, questions or concerns.   Medical decision making: Moderate (1 new problem under work-up with uncertain prognosis, review of external notes, review of  prior results, ordering new tests)  Time spent on visit: I spent 30 minutes counseling the patient face to face. The total time spent in the appointment was 40 minutes and more than 50% was on  counseling.  I, Tarri Abernethy PA-C, have seen this patient in conjunction with Dr. Derek Jack. Greater than 50% of visit was performed by Dr. Delton Coombes.  Addendum: I have independently evaluated this patient and agree with HPI written by Casey Burkitt, PA-C.  She is evaluated for microcytic anemia from iron deficiency.  We will check all the nutritional deficiency panel and myeloma panel.  We will also check stool for occult blood.  Likely combination anemia from blood loss from menorrhagia and decreased iron absorption.  We will arrange for 1 g of IV iron infusion.  We discussed side effects in detail.    Derek Jack, MD 10/19/20 10:30 AM

## 2020-10-10 NOTE — Patient Instructions (Signed)
[place] at [facility] Discharge Instructions  You were seen today by Dr. Delton Coombes and Tarri Abernethy PA-C for your iron deficiency anemia.    LABS: Labs today before leaving.  Take stool cards home and return to lab once complete.   OTHER TESTS: None  MEDICATIONS: Schedule for IV iron x 3  FOLLOW-UP APPOINTMENT: 2 weeks   Thank you for choosing [place] at [facility] to provide your oncology and hematology care.  To afford each patient quality time with our provider, please arrive at least 15 minutes before your scheduled appointment time.   If you have a lab appointment with the [facility] please come in thru the Main Entrance and check in at the main information desk.  You need to re-schedule your appointment should you arrive 10 or more minutes late.  We strive to give you quality time with our providers, and arriving late affects you and other patients whose appointments are after yours.  Also, if you no show three or more times for appointments you may be dismissed from the clinic at the providers discretion.     Again, thank you for choosing [facility].  Our hope is that these requests will decrease the amount of time that you wait before being seen by our physicians.       _____________________________________________________________  Should you have questions after your visit to [facility], please contact our office at [phone] and follow the prompts.  Our office hours are 8:00 a.m. and 4:30 p.m. Monday - Friday.  Please note that voicemails left after 4:00 p.m. may not be returned until the following business day.  We are closed weekends and major holidays.  You do have access to a nurse 24-7, just call the main number to the clinic [phone] and do not press any options, hold on the line and a nurse will answer the phone.    For prescription refill requests, have your pharmacy contact  our office and allow 72 hours.    Due to Covid, you will need to wear a mask upon entering the hospital. If you do not have a mask, a mask will be given to you at the Main Entrance upon arrival. For doctor visits, patients may have 1 support person age 51 or older with them. For treatment visits, patients can not have anyone with them due to social distancing guidelines and our immunocompromised population.

## 2020-10-11 ENCOUNTER — Inpatient Hospital Stay (HOSPITAL_COMMUNITY): Payer: No Typology Code available for payment source

## 2020-10-11 ENCOUNTER — Other Ambulatory Visit (HOSPITAL_COMMUNITY): Payer: Self-pay

## 2020-10-11 ENCOUNTER — Other Ambulatory Visit: Payer: Self-pay

## 2020-10-11 VITALS — BP 112/64 | HR 66 | Temp 97.0°F | Resp 18

## 2020-10-11 DIAGNOSIS — D509 Iron deficiency anemia, unspecified: Secondary | ICD-10-CM | POA: Diagnosis not present

## 2020-10-11 DIAGNOSIS — D500 Iron deficiency anemia secondary to blood loss (chronic): Secondary | ICD-10-CM

## 2020-10-11 LAB — KAPPA/LAMBDA LIGHT CHAINS
Kappa free light chain: 34.4 mg/L — ABNORMAL HIGH (ref 3.3–19.4)
Kappa, lambda light chain ratio: 1.39 (ref 0.26–1.65)
Lambda free light chains: 24.8 mg/L (ref 5.7–26.3)

## 2020-10-11 MED ORDER — SODIUM CHLORIDE 0.9 % IV SOLN
300.0000 mg | Freq: Once | INTRAVENOUS | Status: AC
  Administered 2020-10-11: 300 mg via INTRAVENOUS
  Filled 2020-10-11: qty 15

## 2020-10-11 MED ORDER — ACETAMINOPHEN 325 MG PO TABS
650.0000 mg | ORAL_TABLET | Freq: Once | ORAL | Status: AC
  Administered 2020-10-11: 650 mg via ORAL

## 2020-10-11 MED ORDER — ACETAMINOPHEN 325 MG PO TABS
ORAL_TABLET | ORAL | Status: AC
  Filled 2020-10-11: qty 2

## 2020-10-11 MED ORDER — LORATADINE 10 MG PO TABS
10.0000 mg | ORAL_TABLET | Freq: Once | ORAL | Status: AC
  Administered 2020-10-11: 10 mg via ORAL

## 2020-10-11 MED ORDER — SODIUM CHLORIDE 0.9 % IV SOLN: Freq: Once | INTRAVENOUS | Status: AC

## 2020-10-11 MED ORDER — LORATADINE 10 MG PO TABS
ORAL_TABLET | ORAL | Status: AC
  Filled 2020-10-11: qty 1

## 2020-10-11 NOTE — Progress Notes (Signed)
Patient presents today for Venofer infusion.  Vital signs WNL.  Patient has no new complaint since last visit.  Peripheral IV started and blood return noted pre and post infusion.  Venofer infusion given today per MD orders.  Stable during infusion without adverse affects.  Vital signs stable.  No complaints at this time.  Discharge from clinic ambulatory in stable condition.  Alert and oriented X 3.  Follow up with [facility] as scheduled.

## 2020-10-11 NOTE — Patient Instructions (Signed)
[facility]  Discharge Instructions: Thank you for choosing [facility] to provide your oncology and hematology care.  If you have a lab appointment with the Cancer Center, please come in thru the Main Entrance and check in at the main information desk.  Wear comfortable clothing and clothing appropriate for easy access to any Portacath or PICC line.   We strive to give you quality time with your provider. You may need to reschedule your appointment if you arrive late (15 or more minutes).  Arriving late affects you and other patients whose appointments are after yours.  Also, if you miss three or more appointments without notifying the office, you may be dismissed from the clinic at the provider's discretion.      For prescription refill requests, have your pharmacy contact our office and allow 72 hours for refills to be completed.    Today you received the following chemotherapy and/or immunotherapy agents Venofer infusion      To help prevent nausea and vomiting after your treatment, we encourage you to take your nausea medication as directed.  BELOW ARE SYMPTOMS THAT SHOULD BE REPORTED IMMEDIATELY: *FEVER GREATER THAN 100.4 F (38 C) OR HIGHER *CHILLS OR SWEATING *NAUSEA AND VOMITING THAT IS NOT CONTROLLED WITH YOUR NAUSEA MEDICATION *UNUSUAL SHORTNESS OF BREATH *UNUSUAL BRUISING OR BLEEDING *URINARY PROBLEMS (pain or burning when urinating, or frequent urination) *BOWEL PROBLEMS (unusual diarrhea, constipation, pain near the anus) TENDERNESS IN MOUTH AND THROAT WITH OR WITHOUT PRESENCE OF ULCERS (sore throat, sores in mouth, or a toothache) UNUSUAL RASH, SWELLING OR PAIN  UNUSUAL VAGINAL DISCHARGE OR ITCHING   Items with * indicate a potential emergency and should be followed up as soon as possible or go to the Emergency Department if any problems should occur.  Please show the CHEMOTHERAPY ALERT CARD or IMMUNOTHERAPY ALERT CARD at check-in to the Emergency  Department and triage nurse.  Should you have questions after your visit or need to cancel or reschedule your appointment, please contact [facility] [phone]  and follow the prompts.  Office hours are 8:00 a.m. to 4:30 p.m. Monday - Friday. Please note that voicemails left after 4:00 p.m. may not be returned until the following business day.  We are closed weekends and major holidays. You have access to a nurse at all times for urgent questions. Please call the main number to the clinic [phone] and follow the prompts.  For any non-urgent questions, you may also contact your provider using MyChart. We now offer e-Visits for anyone 18 and older to request care online for non-urgent symptoms. For details visit [email or web address].   Also download the MyChart app! Go to the app store, search "MyChart", open the app, select [facility], and log in with your MyChart username and password.  Due to Covid, a mask is required upon entering the hospital/clinic. If you do not have a mask, one will be given to you upon arrival. For doctor visits, patients may have 1 support person aged 18 or older with them. For treatment visits, patients cannot have anyone with them due to current Covid guidelines and our immunocompromised population.  

## 2020-10-14 ENCOUNTER — Other Ambulatory Visit (HOSPITAL_COMMUNITY): Payer: Self-pay

## 2020-10-14 DIAGNOSIS — D509 Iron deficiency anemia, unspecified: Secondary | ICD-10-CM | POA: Diagnosis not present

## 2020-10-14 DIAGNOSIS — D500 Iron deficiency anemia secondary to blood loss (chronic): Secondary | ICD-10-CM

## 2020-10-14 LAB — PROTEIN ELECTROPHORESIS, SERUM
A/G Ratio: 1.1 (ref 0.7–1.7)
Albumin ELP: 3.8 g/dL (ref 2.9–4.4)
Alpha-1-Globulin: 0.3 g/dL (ref 0.0–0.4)
Alpha-2-Globulin: 0.8 g/dL (ref 0.4–1.0)
Beta Globulin: 1 g/dL (ref 0.7–1.3)
Gamma Globulin: 1.5 g/dL (ref 0.4–1.8)
Globulin, Total: 3.6 g/dL (ref 2.2–3.9)
Total Protein ELP: 7.4 g/dL (ref 6.0–8.5)

## 2020-10-14 LAB — IMMUNOFIXATION ELECTROPHORESIS
IgA: 262 mg/dL (ref 87–352)
IgG (Immunoglobin G), Serum: 1534 mg/dL (ref 586–1602)
IgM (Immunoglobulin M), Srm: 91 mg/dL (ref 26–217)
Total Protein ELP: 7.8 g/dL (ref 6.0–8.5)

## 2020-10-14 LAB — OCCULT BLOOD X 1 CARD TO LAB, STOOL
Fecal Occult Bld: NEGATIVE
Fecal Occult Bld: NEGATIVE
Fecal Occult Bld: NEGATIVE

## 2020-10-15 ENCOUNTER — Inpatient Hospital Stay (HOSPITAL_COMMUNITY): Payer: No Typology Code available for payment source

## 2020-10-15 ENCOUNTER — Other Ambulatory Visit: Payer: Self-pay

## 2020-10-15 ENCOUNTER — Encounter (HOSPITAL_COMMUNITY): Payer: Self-pay

## 2020-10-15 VITALS — BP 105/67 | HR 71 | Temp 97.1°F | Resp 18

## 2020-10-15 DIAGNOSIS — D500 Iron deficiency anemia secondary to blood loss (chronic): Secondary | ICD-10-CM

## 2020-10-15 DIAGNOSIS — D509 Iron deficiency anemia, unspecified: Secondary | ICD-10-CM | POA: Diagnosis not present

## 2020-10-15 LAB — COPPER, SERUM: Copper: 177 ug/dL — ABNORMAL HIGH (ref 80–158)

## 2020-10-15 MED ORDER — LORATADINE 10 MG PO TABS
10.0000 mg | ORAL_TABLET | Freq: Once | ORAL | Status: AC
  Administered 2020-10-15: 10 mg via ORAL
  Filled 2020-10-15: qty 1

## 2020-10-15 MED ORDER — SODIUM CHLORIDE 0.9 % IV SOLN
300.0000 mg | Freq: Once | INTRAVENOUS | Status: AC
  Administered 2020-10-15: 300 mg via INTRAVENOUS
  Filled 2020-10-15: qty 300

## 2020-10-15 MED ORDER — SODIUM CHLORIDE 0.9 % IV SOLN: Freq: Once | INTRAVENOUS | Status: AC

## 2020-10-15 MED ORDER — ACETAMINOPHEN 325 MG PO TABS
650.0000 mg | ORAL_TABLET | Freq: Once | ORAL | Status: AC
  Administered 2020-10-15: 650 mg via ORAL
  Filled 2020-10-15: qty 2

## 2020-10-15 NOTE — Progress Notes (Signed)
Patient presents today for 300 mg  Venofer Infusion. Dose 2. Patient has no complaints of any issues related to side effects of the Venofer infusion. Patient denies any complaints today. MAR reviewed. Vital signs stable.   Venofer infusion given today per MD orders. Tolerated infusion without adverse affects. Vital signs stable. No complaints at this time. Discharged from clinic ambulatory in stable condition. Alert and oriented x 3. F/U with [facility] as scheduled.

## 2020-10-15 NOTE — Patient Instructions (Signed)
[place]  Discharge Instructions: Thank you for choosing [place] [facility] to provide your oncology and hematology care.  If you have a lab appointment with the [facility], please come in thru the Main Entrance and check in at the main information desk.  Wear comfortable clothing and clothing appropriate for easy access to any Portacath or PICC line.   We strive to give you quality time with your provider. You may need to reschedule your appointment if you arrive late (15 or more minutes).  Arriving late affects you and other patients whose appointments are after yours.  Also, if you miss three or more appointments without notifying the office, you may be dismissed from the clinic at the provider's discretion.      For prescription refill requests, have your pharmacy contact our office and allow 72 hours for refills to be completed.    Today you received the following Venofer Infusion.   BELOW ARE SYMPTOMS THAT SHOULD BE REPORTED IMMEDIATELY: . *FEVER GREATER THAN 100.4 F (38 C) OR HIGHER . *CHILLS OR SWEATING . *NAUSEA AND VOMITING THAT IS NOT CONTROLLED WITH YOUR NAUSEA MEDICATION . *UNUSUAL SHORTNESS OF BREATH . *UNUSUAL BRUISING OR BLEEDING . *URINARY PROBLEMS (pain or burning when urinating, or frequent urination) . *BOWEL PROBLEMS (unusual diarrhea, constipation, pain near the anus) . TENDERNESS IN MOUTH AND THROAT WITH OR WITHOUT PRESENCE OF ULCERS (sore throat, sores in mouth, or a toothache) . UNUSUAL RASH, SWELLING OR PAIN  . UNUSUAL VAGINAL DISCHARGE OR ITCHING   Items with * indicate a potential emergency and should be followed up as soon as possible or go to the Emergency Department if any problems should occur.  Should you have questions after your visit or need to cancel or reschedule your appointment, please contact [facility] [phone]  and follow the prompts.  Office hours are 8:00 a.m. to 4:30 p.m. Monday - Friday. Please note  that voicemails left after 4:00 p.m. may not be returned until the following business day.  We are closed weekends and major holidays. You have access to a nurse at all times for urgent questions. Please call the main number to the clinic [phone] and follow the prompts.  For any non-urgent questions, you may also contact your provider using MyChart. We now offer e-Visits for anyone 25 and older to request care online for non-urgent symptoms. For details visit [email or web address].   Also download the MyChart app! Go to the app store, search "MyChart", open the app, select [place], and log in with your MyChart username and password.  Due to Covid, a mask is required upon entering the hospital/clinic. If you do not have a mask, one will be given to you upon arrival. For doctor visits, patients may have 1 support person aged 31 or older with them. For treatment visits, patients cannot have anyone with them due to current Covid guidelines and our immunocompromised population.

## 2020-10-17 ENCOUNTER — Other Ambulatory Visit (HOSPITAL_COMMUNITY): Payer: Self-pay

## 2020-10-17 ENCOUNTER — Ambulatory Visit (INDEPENDENT_AMBULATORY_CARE_PROVIDER_SITE_OTHER): Payer: No Typology Code available for payment source | Admitting: Family Medicine

## 2020-10-17 LAB — METHYLMALONIC ACID, SERUM: Methylmalonic Acid, Quantitative: 96 nmol/L (ref 0–378)

## 2020-10-17 MED FILL — Losartan Potassium & Hydrochlorothiazide Tab 50-12.5 MG: ORAL | 90 days supply | Qty: 90 | Fill #0 | Status: AC

## 2020-10-17 MED FILL — Diclofenac w/ Misoprostol Tab Delayed Release 50-0.2 MG: ORAL | 30 days supply | Qty: 60 | Fill #0 | Status: AC

## 2020-10-18 ENCOUNTER — Encounter (HOSPITAL_COMMUNITY): Payer: Self-pay

## 2020-10-18 ENCOUNTER — Other Ambulatory Visit: Payer: Self-pay

## 2020-10-18 ENCOUNTER — Inpatient Hospital Stay (HOSPITAL_COMMUNITY): Payer: No Typology Code available for payment source

## 2020-10-18 VITALS — BP 107/66 | HR 66 | Temp 97.2°F | Resp 18

## 2020-10-18 DIAGNOSIS — D500 Iron deficiency anemia secondary to blood loss (chronic): Secondary | ICD-10-CM

## 2020-10-18 DIAGNOSIS — D509 Iron deficiency anemia, unspecified: Secondary | ICD-10-CM | POA: Diagnosis not present

## 2020-10-18 MED ORDER — SODIUM CHLORIDE 0.9 % IV SOLN: Freq: Once | INTRAVENOUS | Status: AC

## 2020-10-18 MED ORDER — ACETAMINOPHEN 325 MG PO TABS
650.0000 mg | ORAL_TABLET | Freq: Once | ORAL | Status: AC
Start: 2020-10-18 — End: 2020-10-18
  Administered 2020-10-18: 650 mg via ORAL

## 2020-10-18 MED ORDER — ACETAMINOPHEN 325 MG PO TABS
ORAL_TABLET | ORAL | Status: AC
  Filled 2020-10-18: qty 2

## 2020-10-18 MED ORDER — LORATADINE 10 MG PO TABS
10.0000 mg | ORAL_TABLET | Freq: Once | ORAL | Status: AC
  Administered 2020-10-18: 10 mg via ORAL

## 2020-10-18 MED ORDER — SODIUM CHLORIDE 0.9 % IV SOLN
400.0000 mg | Freq: Once | INTRAVENOUS | Status: AC
  Administered 2020-10-18: 400 mg via INTRAVENOUS
  Filled 2020-10-18: qty 20

## 2020-10-18 MED ORDER — LORATADINE 10 MG PO TABS
ORAL_TABLET | ORAL | Status: AC
  Filled 2020-10-18: qty 1

## 2020-10-18 NOTE — Patient Instructions (Signed)
[name]     Discharge Instructions: Thank you for choosing [facility] to provide your oncology and hematology care.  If you have a lab appointment with the [facility], please come in thru the Main Entrance and check in at the main information desk.  We strive to give you quality time with your provider. You may need to reschedule your appointment if you arrive late (15 or more minutes).  Arriving late affects you and other patients whose appointments are after yours.  Also, if you miss three or more appointments without notifying the office, you may be dismissed from the clinic at the provider's discretion.      For prescription refill requests, have your pharmacy contact our office and allow 72 hours for refills to be completed.    Today you received Venofer iron infusion. Return as scheduled for infusions. Return as scheduled for lab work and office visit.       Should you have questions after your visit or need to cancel or reschedule your appointment, please contact [facility] [phone]  and follow the prompts.  Office hours are 8:00 a.m. to 4:30 p.m. Monday - Friday. Please note that voicemails left after 4:00 p.m. may not be returned until the following business day.  We are closed weekends and major holidays. You have access to a nurse at all times for urgent questions. Please call the main number to the clinic [phone] and follow the prompts.  For any non-urgent questions, you may also contact your provider using MyChart. We now offer e-Visits for anyone 42 and older to request care online for non-urgent symptoms. For details visit [email or web address].   Also download the MyChart app! Go to the app store, search "MyChart", open the app, select , and log in with your MyChart username and password.  Due to Covid, a mask is required upon entering the hospital/clinic. If you do not have a mask, one will be given to you  upon arrival. For doctor visits, patients may have 1 support person aged 12 or older with them. For treatment visits, patients cannot have anyone with them due to current Covid guidelines and our immunocompromised population.

## 2020-10-18 NOTE — Progress Notes (Signed)
Tolerated infusion w/o adverse reaction.  Alert, in no distress.  VSS.  Discharged ambulatory in stable condition.  

## 2020-10-21 ENCOUNTER — Other Ambulatory Visit (HOSPITAL_COMMUNITY): Payer: Self-pay

## 2020-10-31 ENCOUNTER — Other Ambulatory Visit: Payer: Self-pay

## 2020-10-31 ENCOUNTER — Inpatient Hospital Stay (HOSPITAL_COMMUNITY): Payer: No Typology Code available for payment source | Attending: Hematology | Admitting: Physician Assistant

## 2020-10-31 VITALS — BP 134/68 | HR 74 | Temp 97.0°F | Resp 18 | Wt 203.0 lb

## 2020-10-31 DIAGNOSIS — N920 Excessive and frequent menstruation with regular cycle: Secondary | ICD-10-CM | POA: Diagnosis present

## 2020-10-31 DIAGNOSIS — D500 Iron deficiency anemia secondary to blood loss (chronic): Secondary | ICD-10-CM | POA: Insufficient documentation

## 2020-10-31 NOTE — Patient Instructions (Signed)
[place] at [facility] Discharge Instructions  You were seen today by Tarri Abernethy PA-C for your iron deficiency anemia.    LABS: Return in 8 weeks for labs   OTHER TESTS: None  MEDICATIONS: No changes  FOLLOW-UP APPOINTMENT: 8 weeks   Thank you for choosing [place] at [facility] to provide your oncology and hematology care.  To afford each patient quality time with our provider, please arrive at least 15 minutes before your scheduled appointment time.   If you have a lab appointment with the [facility] please come in thru the Main Entrance and check in at the main information desk.  You need to re-schedule your appointment should you arrive 10 or more minutes late.  We strive to give you quality time with our providers, and arriving late affects you and other patients whose appointments are after yours.  Also, if you no show three or more times for appointments you may be dismissed from the clinic at the providers discretion.     Again, thank you for choosing [facility].  Our hope is that these requests will decrease the amount of time that you wait before being seen by our physicians.       _____________________________________________________________  Should you have questions after your visit to [facility], please contact our office at [phone] and follow the prompts.  Our office hours are 8:00 a.m. and 4:30 p.m. Monday - Friday.  Please note that voicemails left after 4:00 p.m. may not be returned until the following business day.  We are closed weekends and major holidays.  You do have access to a nurse 24-7, just call the main number to the clinic [phone] and do not press any options, hold on the line and a nurse will answer the phone.    For prescription refill requests, have your pharmacy contact our office and allow 72 hours.    Due to Covid, you will need to wear a mask upon  entering the hospital. If you do not have a mask, a mask will be given to you at the Main Entrance upon arrival. For doctor visits, patients may have 1 support person age 42 or older with them. For treatment visits, patients can not have anyone with them due to social distancing guidelines and our immunocompromised population.

## 2020-10-31 NOTE — Progress Notes (Signed)
[place], [place] [address]   CLINIC:  Medical Oncology/Hematology  PCP:  Fayrene Helper, MD [address], [place] [id number] [phone]   REASON FOR VISIT:  Follow-up for iron deficiency anemia  CURRENT THERAPY: Intermittent IV iron infusions  INTERVAL HISTORY:  [name] 50 y.o. female returns for routine follow-up of iron deficiency anemia.  She was seen for initial consultation by Dr. Delton Coombes and Tarri Abernethy PA-C on 10/10/2020.  She returns today to discuss the results of work-up initiated at that visit.  At her last visit, she had complained of fatigue with energy 40%, as well as lightheadedness.  She also complained of symptoms of pica and that she was craving to eat dirt.  Since her last visit, the patient has received 1000 mg of IV Venofer in 3 divided doses, with the last dose given on 10/18/2020.  She reports that she tolerated the treatment well without infusion reaction.  She has felt slightly improved energy after IV iron (currently rated at 60%), and slightly decreased lightheadedness.  She reports that her cravings for dirt and pica symptoms are less intense.  She has menorrhagia, but denies any other signs or symptoms of blood loss.  No bright red blood per rectum or melena.  She has 60% energy and 100% appetite. She endorses that she is maintaining a stable weight.    REVIEW OF SYSTEMS:  Review of Systems  Constitutional: Positive for fatigue (60%). Negative for appetite change, chills, diaphoresis, fever and unexpected weight change.  HENT:   Negative for lump/mass and nosebleeds.   Eyes: Negative for eye problems.  Respiratory: Negative for cough, hemoptysis and shortness of breath.   Cardiovascular: Negative for chest pain, leg swelling and palpitations.  Gastrointestinal: Negative for abdominal pain, blood in stool, constipation, diarrhea, nausea and vomiting.  Genitourinary: Negative  for hematuria.   Skin: Negative.   Neurological: Positive for light-headedness (occasional). Negative for dizziness and headaches.  Hematological: Does not bruise/bleed easily.      PAST MEDICAL/SURGICAL HISTORY:  Past Medical History:  Diagnosis Date  . Arthritis    Phreesia 03/29/2020  . Back pain   . DDD (degenerative disc disease), lumbar   . Diabetes mellitus without complication ([place])    Phreesia 03/29/2020  . Essential hypertension, benign   . GERD (gastroesophageal reflux disease)   . Hypertension    Phreesia 03/29/2020  . Joint pain   . Lactose intolerance   . Lumbar herniated disc   . Migraine headache 2007  . Obesity    Since childhood   . Osteoarthritis   . Pneumonia 2007  . Sacroiliac joint disease   . Spinal disease   . Supraventricular tachycardia [facility])    Status post RFA July 2015 - Dr. Lovena Le  . Type 2 diabetes mellitus ([place])   . Varicose vein of leg   . Vitamin D deficiency   . Weight loss    Hopitalized 4 years ago after loosing 200lb on a supervised program, malnurished with severe protein deficiency    Past Surgical History:  Procedure Laterality Date  . ABLATION  12-28-2013   slow pathway modification of AVNRT by Dr Lovena Le  . COLONOSCOPY N/A 11/17/2019   Procedure: COLONOSCOPY;  Surgeon: Daneil Dolin, MD;  Location: [facility] ENDO SUITE;  Service: Endoscopy;  Laterality: N/A;  7:30  . ELECTROPHYSIOLOGY STUDY N/A 12/28/2013   Procedure: ELECTROPHYSIOLOGY STUDY;  Surgeon: Evans Lance, MD;  Location: [facility] CATH  LAB;  Service: Cardiovascular;  Laterality: N/A;  . SUPRAVENTRICULAR TACHYCARDIA ABLATION N/A 12/28/2013   Procedure: SUPRAVENTRICULAR TACHYCARDIA ABLATION;  Surgeon: Evans Lance, MD;  Location: [facility] CATH LAB;  Service: Cardiovascular;  Laterality: N/A;  . TUBAL LIGATION  1996     SOCIAL HISTORY:  Social History   Socioeconomic History  . Marital status: Married    Spouse name: Not on file  . Number of children: 2  . Years of education: Not  on file  . Highest education level: Not on file  Occupational History  . Occupation: English as a second language teacher at [employer]  . Smoking status: Former Smoker    Packs/day: 0.50    Years: 7.00    Pack years: 3.50    Types: Cigarettes    Quit date: 08/05/2012    Years since quitting: 8.2  . Smokeless tobacco: Never Used  . Tobacco comment: quit in 2014  Vaping Use  . Vaping Use: Never used  Substance and Sexual Activity  . Alcohol use: No    Alcohol/week: 0.0 standard drinks  . Drug use: No  . Sexual activity: Yes    Birth control/protection: None, Surgical    Comment: tubal ligation  Other Topics Concern  . Not on file  Social History Narrative  . Not on file   Social Determinants of Health   Financial Resource Strain: Low Risk   . Difficulty of Paying Living Expenses: Not very hard  Food Insecurity: No Food Insecurity  . Worried About Charity fundraiser in the Last Year: Never true  . Ran Out of Food in the Last Year: Never true  Transportation Needs: No Transportation Needs  . Lack of Transportation (Medical): No  . Lack of Transportation (Non-Medical): No  Physical Activity: Inactive  . Days of Exercise per Week: 0 days  . Minutes of Exercise per Session: 0 min  Stress: No Stress Concern Present  . Feeling of Stress : Not at all  Social Connections: Moderately Integrated  . Frequency of Communication with Friends and Family: Three times a week  . Frequency of Social Gatherings with Friends and Family: Three times a week  . Attends Religious Services: More than 4 times per year  . Active Member of Clubs or Organizations: No  . Attends Archivist Meetings: Never  . Marital Status: Married  Human resources officer Violence: Not At Risk  . Fear of Current or Ex-Partner: No  . Emotionally Abused: No  . Physically Abused: No  . Sexually Abused: No    FAMILY HISTORY:  Family History  Problem Relation Age of Onset  . Hypertension Mother   . Diabetes Mother   . Cancer  Mother        Uterine   . Heart failure Mother        CABG  . Hyperlipidemia Mother   . Heart disease Mother   . Thyroid disease Mother   . Cancer Father 91       Prostate   . Hypertension Father   . Bradycardia Father        Irregular heart beat   . Hyperlipidemia Father   . Kidney disease Father   . Diabetes Brother   . Diabetes Brother   . Hypertension Brother   . Hypertension Brother     CURRENT MEDICATIONS:  Outpatient Encounter Medications as of 10/31/2020  Medication Sig  . acetaminophen (TYLENOL) 500 MG tablet Take 1,000 mg by mouth every 6 (six) hours as needed (for pain).  Marland Kitchen  Cholecalciferol 25 MCG (1000 UT) tablet Take 1,000 Units by mouth daily.   . cyclobenzaprine (FLEXERIL) 10 MG tablet TAKE 1 TABLET BY MOUTH AT BEDTIME  . cyclobenzaprine (FLEXERIL) 10 MG tablet TAKE 1 TABLET BY MOUTH AT BEDTIME  . Diclofenac-miSOPROStol 50-0.2 MG TBEC TAKE 1 TABLET BY MOUTH TWICE DAILY AFTER A MEAL  . famotidine (PEPCID) 40 MG tablet TAKE ONE TABLET ONCE DAILY WHEN YOU TAKE IBUPROFEN  . ferrous sulfate 325 (65 FE) MG tablet Take 325 mg by mouth every other day.  . gabapentin (NEURONTIN) 100 MG capsule TAKE ONE TO TWO CAPSULES BY  MOUTH AT BEDTIME FOR BACK PAIN  . losartan-hydrochlorothiazide (HYZAAR) 50-12.5 MG tablet TAKE 1 TABLET BY MOUTH ONCE DAILY  . Semaglutide-Weight Management 0.25 MG/0.5ML SOAJ INJECT 0.25 MG INTO THE SKIN ONCE A WEEK.   No facility-administered encounter medications on file as of 10/31/2020.    ALLERGIES:  Allergies  Allergen Reactions  . Other   . Codeine Nausea And Vomiting  . Metoclopramide Hcl Other (See Comments)    Not in right state of mind     PHYSICAL EXAM:  ECOG PERFORMANCE STATUS: 1 - Symptomatic but completely ambulatory  There were no vitals filed for this visit. There were no vitals filed for this visit. Physical Exam Constitutional:      Appearance: Normal appearance.  HENT:     Head: Normocephalic and atraumatic.      Mouth/Throat:     Mouth: Mucous membranes are moist.  Eyes:     Extraocular Movements: Extraocular movements intact.     Pupils: Pupils are equal, round, and reactive to light.  Cardiovascular:     Rate and Rhythm: Normal rate and regular rhythm.     Pulses: Normal pulses.     Heart sounds: Normal heart sounds.  Pulmonary:     Effort: Pulmonary effort is normal.     Breath sounds: Normal breath sounds.  Abdominal:     General: Bowel sounds are normal.     Palpations: Abdomen is soft.     Tenderness: There is no abdominal tenderness.  Musculoskeletal:        General: No swelling.     Right lower leg: No edema.     Left lower leg: No edema.  Lymphadenopathy:     Cervical: No cervical adenopathy.  Skin:    General: Skin is warm and dry.  Neurological:     General: No focal deficit present.     Mental Status: She is alert and oriented to person, place, and time.  Psychiatric:        Mood and Affect: Mood normal.        Behavior: Behavior normal.      LABORATORY DATA:  I have reviewed the labs as listed.  CBC    Component Value Date/Time   WBC 8.5 10/10/2020 1022   RBC 4.23 10/10/2020 1022   HGB 10.1 (L) 10/10/2020 1022   HGB 9.9 (L) 09/23/2020 1501   HCT 33.5 (L) 10/10/2020 1022   HCT 31.9 (L) 09/23/2020 1501   PLT 305 10/10/2020 1022   PLT 281 09/23/2020 1501   MCV 79.2 (L) 10/10/2020 1022   MCV 77 (L) 09/23/2020 1501   MCH 23.9 (L) 10/10/2020 1022   MCHC 30.1 10/10/2020 1022   RDW 16.8 (H) 10/10/2020 1022   RDW 15.8 (H) 09/23/2020 1501   LYMPHSABS 5.6 (H) 10/10/2020 1022   LYMPHSABS 5.0 (H) 08/22/2020 0956   MONOABS 0.8 10/10/2020 1022   EOSABS 0.2  10/10/2020 1022   EOSABS 0.1 08/22/2020 0956   BASOSABS 0.1 10/10/2020 1022   BASOSABS 0.1 08/22/2020 0956   CMP Latest Ref Rng & Units 08/22/2020 08/01/2020 12/07/2019  Glucose 65 - 99 mg/dL 82 85 89  BUN 6 - 24 mg/dL 12 14 13   Creatinine 0.57 - 1.00 mg/dL 0.78 0.93 0.80  Sodium 134 - 144 mmol/L 136 140 139   Potassium 3.5 - 5.2 mmol/L 4.0 4.2 3.5  Chloride 96 - 106 mmol/L 99 101 98  CO2 20 - 29 mmol/L 20 22 25   Calcium 8.7 - 10.2 mg/dL 9.2 9.5 9.4  Total Protein 6.0 - 8.5 g/dL 7.5 - -  Total Bilirubin 0.0 - 1.2 mg/dL <0.2 - -  Alkaline Phos 44 - 121 IU/L 61 - -  AST 0 - 40 IU/L 14 - -  ALT 0 - 32 IU/L 7 - -    DIAGNOSTIC IMAGING:  I have independently reviewed the relevant imaging and discussed with the patient.  ASSESSMENT: 1.  Iron deficiency anemia -  Secondary to chronic blood loss (menorrhagia) and malabsorption in the setting of PPI use - Failed to improve on oral iron tablets - Labs (10/14/2020) show Hgb 10.1 with MCV 79.2 - Iron panel (10/14/2020): Ferritin 8, iron saturation 15% - Hemoccult stool x3 negative, denies melena or bright red blood per rectum - Work-up for other causes of anemia unremarkable (normal methylmalonic acid, B12, folate, copper, LDH, IFE/SPEP/FLC); CMP (08/22/2020) showed normal creatinine 0.78, normal liver function. - Normal screening colonoscopy in May 2021; no history of EGD - Failed to improve on oral iron tablets - Symptomatic with fatigue, pica, lightheadedness - Received IV Venofer x3 (last dose 10/18/2020)  2.  Other history - Her other past medical history is significant forchronic back pain, history of SVT s/p ablation, hypertension, diet-controlled diabetes mellitus type 2. 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 She is former smoker (10 pack-year history), quit over 10 years ago. She denies alcohol or drug use. - Patient's mother had uterine caner, father with prostate cancer.No other known family history of anemia or cancers.   PLAN:  1.  Iron deficiency anemia -  Symptoms improved after 1000 mg IV Venofer in 3 divided doses - Repeat CBC and iron panel in 8 weeks - RTC in 8 weeks   PLAN SUMMARY & DISPOSITION: -Repeat CBC and iron panel in 8 weeks - RTC in 8 weeks   All questions were answered. The patient knows to call the clinic  with any problems, questions or concerns.  Medical decision making: Low  Time spent on visit: I spent 10 minutes counseling the patient face to face. The total time spent in the appointment was 15 minutes and more than 50% was on counseling.   Harriett Rush, PA-C  10/31/20 8:42 AM

## 2020-11-04 ENCOUNTER — Inpatient Hospital Stay (HOSPITAL_COMMUNITY): Admission: RE | Admit: 2020-11-04 | Payer: No Typology Code available for payment source | Source: Ambulatory Visit

## 2020-11-06 ENCOUNTER — Other Ambulatory Visit: Payer: Self-pay

## 2020-11-06 ENCOUNTER — Ambulatory Visit (HOSPITAL_COMMUNITY)
Admission: RE | Admit: 2020-11-06 | Discharge: 2020-11-06 | Disposition: A | Discharge status: Home / Self Care | Payer: No Typology Code available for payment source | Source: Ambulatory Visit | Attending: Family Medicine | Admitting: Family Medicine

## 2020-11-06 DIAGNOSIS — Z1231 Encounter for screening mammogram for malignant neoplasm of breast: Secondary | ICD-10-CM | POA: Diagnosis present

## 2020-11-07 ENCOUNTER — Other Ambulatory Visit (HOSPITAL_COMMUNITY): Payer: Self-pay | Admitting: Family Medicine

## 2020-11-07 DIAGNOSIS — R928 Other abnormal and inconclusive findings on diagnostic imaging of breast: Secondary | ICD-10-CM

## 2020-11-13 ENCOUNTER — Ambulatory Visit (INDEPENDENT_AMBULATORY_CARE_PROVIDER_SITE_OTHER): Payer: No Typology Code available for payment source | Admitting: Family Medicine

## 2020-11-13 ENCOUNTER — Other Ambulatory Visit (HOSPITAL_COMMUNITY): Payer: Self-pay

## 2020-11-13 ENCOUNTER — Encounter (INDEPENDENT_AMBULATORY_CARE_PROVIDER_SITE_OTHER): Payer: Self-pay | Admitting: Family Medicine

## 2020-11-13 ENCOUNTER — Other Ambulatory Visit: Payer: Self-pay

## 2020-11-13 VITALS — BP 118/80 | HR 71 | Temp 97.9°F | Ht 71.0 in | Wt 199.0 lb

## 2020-11-13 DIAGNOSIS — E669 Obesity, unspecified: Secondary | ICD-10-CM | POA: Diagnosis not present

## 2020-11-13 DIAGNOSIS — Z6830 Body mass index (BMI) 30.0-30.9, adult: Secondary | ICD-10-CM | POA: Diagnosis not present

## 2020-11-13 DIAGNOSIS — R7303 Prediabetes: Secondary | ICD-10-CM | POA: Diagnosis not present

## 2020-11-13 DIAGNOSIS — Z9189 Other specified personal risk factors, not elsewhere classified: Secondary | ICD-10-CM

## 2020-11-13 DIAGNOSIS — D508 Other iron deficiency anemias: Secondary | ICD-10-CM

## 2020-11-13 MED ORDER — OZEMPIC (0.25 OR 0.5 MG/DOSE) 2 MG/1.5ML ~~LOC~~ SOPN
0.5000 mg | PEN_INJECTOR | SUBCUTANEOUS | 0 refills | Status: DC
  Filled 2020-11-13: qty 1.5, 28d supply, fill #0

## 2020-11-14 NOTE — Progress Notes (Signed)
Chief Complaint:   OBESITY [name] is here to discuss her progress with her obesity treatment plan along with follow-up of her obesity related diagnoses. [name] is on the Category 3 Plan and states she is following her eating plan approximately 95% of the time. [name] states she is doing 0 minutes 0 times per week.  Today's visit was #: 4 Starting weight: 205 lbs Starting date: 08/22/2020 Today's weight: 199 lbs Today's date: 11/13/2020 Total lbs lost to date: 6 Total lbs lost since last in-office visit: 5  Interim History: [name] has been unable to get 0.50 mg dose of Wegovy due to availability, so she has been off of this for about 1 week. She had been tolerating the [facility] well and felt it helped with her appetite. She is getting in adequate protein and water. Her top weight was 333 lbs 10 years ago. Her weight goal is 185 (25 BMI). She struggles to find time to exercise.   Subjective:   1. Pre-diabetes [name]'s last A1c was 5.8. She is unable to obtain [facility] due to availability. She continues to work on diet and exercise to decrease her risk of diabetes.   Lab Results  Component Value Date   HGBA1C 5.8 (H) 08/22/2020   Lab Results  Component Value Date   INSULIN 7.8 08/22/2020   2. Other iron deficiency anemia [name] has started iron infusions recently. She has iron deficiency anemia due to heavy periods. She feels much better since starting infusions. Last hemoglobin was 10.1 on 10/10/2020.   CBC Latest Ref Rng & Units 10/10/2020 09/23/2020 08/22/2020  WBC 4.0 - 10.5 K/uL 8.5 6.7 8.1  Hemoglobin 12.0 - 15.0 g/dL 10.1(L) 9.9(L) 10.8(L)  Hematocrit 36.0 - 46.0 % 33.5(L) 31.9(L) 35.0  Platelets 150 - 400 K/uL 305 281 320   Lab Results  Component Value Date   IRON 73 10/10/2020   TIBC 479 (H) 10/10/2020   FERRITIN 8 (L) 10/10/2020   Lab Results  Component Value Date   VITAMINB12 556 08/22/2020   3. At risk for side effect of medication [name] is at risk for drug side  effects due to starting Ozempic.  Assessment/Plan:   1. Pre-diabetes [name] agreed to start Ozempic 0.50 mg weekly with no refills (she is to start at 0.25 mg for one week).  - Semaglutide,0.25 or 0.5MG /DOS, (OZEMPIC, 0.25 OR 0.5 MG/DOSE,) 2 MG/1.5ML SOPN; Inject 0.5 mg into the skin once a week.  Dispense: 1.5 mL; Refill: 0  2. Other iron deficiency anemia [name] will continue to follow up with Hematology in July as directed.   3. At risk for side effect of medication [name] was given approximately 15 minutes of drug side effect counseling today.  We discussed side effect possibility and risk versus benefits. [name] agreed to the medication and will contact this office if these side effects are intolerable.  Repetitive spaced learning was employed today to elicit superior memory formation and behavioral change.  4. Current BMI 27.77 [name] is currently in the action stage of change. As such, her goal is to continue with weight loss efforts. She has agreed to the Category 3 Plan.   Exercise goals: [name] will try to exercise on the weekends.  Behavioral modification strategies: planning for success.  [name] has agreed to follow-up with our clinic in 3 weeks.   Objective:   Blood pressure 118/80, pulse 71, temperature 97.9 F (36.6 C), temperature source Oral, height 5\' 11"  (1.803 m), weight 199 lb (90.3 kg), last  menstrual period 10/20/2020, SpO2 99 %. Body mass index is 27.75 kg/m.  General: Cooperative, alert, well developed, in no acute distress. HEENT: Conjunctivae and lids unremarkable. Cardiovascular: Regular rhythm.  Lungs: Normal work of breathing. Neurologic: No focal deficits.   Lab Results  Component Value Date   CREATININE 0.78 08/22/2020   BUN 12 08/22/2020   NA 136 08/22/2020   K 4.0 08/22/2020   CL 99 08/22/2020   CO2 20 08/22/2020   Lab Results  Component Value Date   ALT 7 08/22/2020   AST 14 08/22/2020   ALKPHOS 61 08/22/2020   BILITOT <0.2  08/22/2020   Lab Results  Component Value Date   HGBA1C 5.8 (H) 08/22/2020   HGBA1C 5.4 03/15/2019   HGBA1C 5.4 08/23/2017   HGBA1C 5.3 08/22/2016   HGBA1C 5.5 04/20/2016   Lab Results  Component Value Date   INSULIN 7.8 08/22/2020   Lab Results  Component Value Date   TSH 1.250 08/22/2020   Lab Results  Component Value Date   CHOL 174 08/01/2020   HDL 69 08/01/2020   LDLCALC 91 08/01/2020   TRIG 75 08/01/2020   CHOLHDL 2.5 08/01/2020   Lab Results  Component Value Date   WBC 8.5 10/10/2020   HGB 10.1 (L) 10/10/2020   HCT 33.5 (L) 10/10/2020   MCV 79.2 (L) 10/10/2020   PLT 305 10/10/2020   Lab Results  Component Value Date   IRON 73 10/10/2020   TIBC 479 (H) 10/10/2020   FERRITIN 8 (L) 10/10/2020   Attestation Statements:   Reviewed by clinician on day of visit: allergies, medications, problem list, medical history, surgical history, family history, social history, and previous encounter notes.   Wilhemena Durie, am acting as Location manager for [employer], FNP-C.  I have reviewed the above documentation for accuracy and completeness, and I agree with the above. -  Georgianne Fick, FNP

## 2020-11-19 ENCOUNTER — Encounter (INDEPENDENT_AMBULATORY_CARE_PROVIDER_SITE_OTHER): Payer: Self-pay | Admitting: Family Medicine

## 2020-11-19 DIAGNOSIS — R7303 Prediabetes: Secondary | ICD-10-CM | POA: Insufficient documentation

## 2020-11-19 DIAGNOSIS — D539 Nutritional anemia, unspecified: Secondary | ICD-10-CM | POA: Insufficient documentation

## 2020-11-19 DIAGNOSIS — D649 Anemia, unspecified: Secondary | ICD-10-CM | POA: Insufficient documentation

## 2020-11-27 ENCOUNTER — Other Ambulatory Visit: Payer: Self-pay | Admitting: Family Medicine

## 2020-11-27 ENCOUNTER — Other Ambulatory Visit: Payer: Self-pay | Admitting: Specialist

## 2020-11-27 ENCOUNTER — Other Ambulatory Visit (HOSPITAL_COMMUNITY): Payer: Self-pay

## 2020-11-27 DIAGNOSIS — M5430 Sciatica, unspecified side: Secondary | ICD-10-CM

## 2020-11-27 DIAGNOSIS — M549 Dorsalgia, unspecified: Secondary | ICD-10-CM

## 2020-11-27 MED ORDER — FAMOTIDINE 40 MG PO TABS
ORAL_TABLET | ORAL | 0 refills | Status: DC
  Filled 2020-11-27: qty 90, 90d supply, fill #0

## 2020-11-27 MED ORDER — DICLOFENAC-MISOPROSTOL 50-0.2 MG PO TBEC
DELAYED_RELEASE_TABLET | ORAL | 3 refills | Status: DC
  Filled 2020-11-27: qty 60, 30d supply, fill #0
  Filled 2021-02-25: qty 60, 30d supply, fill #1
  Filled 2021-04-23: qty 60, 30d supply, fill #2
  Filled 2021-06-17: qty 60, 30d supply, fill #3

## 2020-11-27 MED ORDER — CYCLOBENZAPRINE HCL 10 MG PO TABS
ORAL_TABLET | Freq: Every day | ORAL | 0 refills | Status: DC
  Filled 2020-11-27: qty 90, 90d supply, fill #0

## 2020-11-30 ENCOUNTER — Other Ambulatory Visit (HOSPITAL_COMMUNITY): Payer: Self-pay

## 2020-12-03 ENCOUNTER — Ambulatory Visit (HOSPITAL_COMMUNITY)
Admission: RE | Admit: 2020-12-03 | Discharge: 2020-12-03 | Disposition: A | Discharge status: Home / Self Care | Payer: No Typology Code available for payment source | Source: Ambulatory Visit | Attending: Family Medicine | Admitting: Family Medicine

## 2020-12-03 ENCOUNTER — Other Ambulatory Visit: Payer: Self-pay

## 2020-12-03 DIAGNOSIS — R928 Other abnormal and inconclusive findings on diagnostic imaging of breast: Secondary | ICD-10-CM

## 2020-12-04 ENCOUNTER — Ambulatory Visit (INDEPENDENT_AMBULATORY_CARE_PROVIDER_SITE_OTHER): Payer: No Typology Code available for payment source | Admitting: Adult Health

## 2020-12-04 ENCOUNTER — Other Ambulatory Visit (HOSPITAL_COMMUNITY): Payer: Self-pay

## 2020-12-04 ENCOUNTER — Encounter (INDEPENDENT_AMBULATORY_CARE_PROVIDER_SITE_OTHER): Payer: Self-pay | Admitting: Adult Health

## 2020-12-04 VITALS — BP 105/58 | HR 62 | Temp 98.1°F | Ht 71.0 in | Wt 196.0 lb

## 2020-12-04 DIAGNOSIS — E669 Obesity, unspecified: Secondary | ICD-10-CM | POA: Diagnosis not present

## 2020-12-04 DIAGNOSIS — Z6830 Body mass index (BMI) 30.0-30.9, adult: Secondary | ICD-10-CM

## 2020-12-04 DIAGNOSIS — I10 Essential (primary) hypertension: Secondary | ICD-10-CM

## 2020-12-04 DIAGNOSIS — R7303 Prediabetes: Secondary | ICD-10-CM

## 2020-12-04 DIAGNOSIS — Z9189 Other specified personal risk factors, not elsewhere classified: Secondary | ICD-10-CM | POA: Diagnosis not present

## 2020-12-04 MED ORDER — OZEMPIC (0.25 OR 0.5 MG/DOSE) 2 MG/1.5ML ~~LOC~~ SOPN
0.5000 mg | PEN_INJECTOR | SUBCUTANEOUS | 0 refills | Status: DC
  Filled 2020-12-04: qty 1.5, 28d supply, fill #0

## 2020-12-05 ENCOUNTER — Other Ambulatory Visit (HOSPITAL_COMMUNITY): Payer: Self-pay

## 2020-12-09 NOTE — Progress Notes (Signed)
Chief Complaint:   OBESITY [name] is here to discuss her progress with her obesity treatment plan along with follow-up of her obesity related diagnoses. [name] is on the Category 3 Plan and states she is following her eating plan approximately 96% of the time. [name] states she is walking 30 minutes 2 times per week.  Today's visit was #: 5 Starting weight: 205 lbs Starting date: 08/22/2020 Today's weight: 196 lbs Today's date: 12/04/2020 Total lbs lost to date: 9 Total lbs lost since last in-office visit: 3  Interim History: [name] was started on Wegovy 0.25 mg March 2022, but was unable to fill Rx due to Advertising account executive.  She was changed to Ozempic 0.25 mg at the end of May 2022 and has titrated up to Ozempic 0.5 mg- She has had 2 doses at this strength and is tolerating it well. She has experienced polyphagia mid-morning, early afternoon.  Subjective:   1. Pre-diabetes [name] was started on Wegovy 0.25 mg March 2022, but was unable to fill Rx due to Advertising account executive. She was changed to Ozempic 0.25 mg at the end of May 2022 and has titrated up to Ozempic 0.5 mg- She has had 2 doses at this strength and is tolerating it well. She has experienced polyphagia mid-morning, early afternoon. 08/22/2020 A1c 5.8 with normal BG 82.   Lab Results  Component Value Date   HGBA1C 5.8 (H) 08/22/2020   Lab Results  Component Value Date   INSULIN 7.8 08/22/2020   2. Essential hypertension BP/HR excellent at OV. [name] denies CP/dyspnea/dizziness with position changes.  BP Readings from Last 3 Encounters:  12/04/20 (!) 105/58  11/13/20 118/80  10/31/20 134/68   3. At risk for nausea [name] is at risk for nausea due to pre-diabetes and GLP-1 therapy.   Assessment/Plan:   1. Pre-diabetes [name] will continue to work on weight loss, exercise, and decreasing simple carbohydrates to help decrease the risk of diabetes.  If tolerating well and still experiencing polyphagia at next  OV, consider increasing Ozempic to 1 mg.  - Semaglutide,0.25 or 0.5MG /DOS, (OZEMPIC, 0.25 OR 0.5 MG/DOSE,) 2 MG/1.5ML SOPN; Inject 0.5 mg into the skin once a week.  Dispense: 1.5 mL; Refill: 0  2. Essential hypertension [name] is working on healthy weight loss and exercise to improve blood pressure control. We will watch for signs of hypotension as she continues her lifestyle modifications. Monitor for symptoms of hypotension.  3. At risk for nausea [name] was given approximately 15 minutes of nausea prevention counseling today. [name] is at risk for nausea due to her new or current medication. She was encouraged to titrate her medication slowly, make sure to stay hydrated, eat smaller portions throughout the day, and avoid high fat meals.    4. Current BMI 27.4  [name] is currently in the action stage of change. As such, her goal is to continue with weight loss efforts. She has agreed to the Category 3 Plan.   Exercise goals:  As is  Behavioral modification strategies: increasing lean protein intake, decreasing simple carbohydrates, meal planning and cooking strategies, keeping healthy foods in the home, and planning for success.  [name] has agreed to follow-up with our clinic in 2 weeks. She was informed of the importance of frequent follow-up visits to maximize her success with intensive lifestyle modifications for her multiple health conditions.   Objective:   Blood pressure (!) 105/58, pulse 62, temperature 98.1 F (36.7 C), height 5\' 11"  (1.803 m),  weight 196 lb (88.9 kg), SpO2 96 %. Body mass index is 27.34 kg/m.  General: Cooperative, alert, well developed, in no acute distress. HEENT: Conjunctivae and lids unremarkable. Cardiovascular: Regular rhythm.  Lungs: Normal work of breathing. Neurologic: No focal deficits.   Lab Results  Component Value Date   CREATININE 0.78 08/22/2020   BUN 12 08/22/2020   NA 136 08/22/2020   K 4.0 08/22/2020   CL 99  08/22/2020   CO2 20 08/22/2020   Lab Results  Component Value Date   ALT 7 08/22/2020   AST 14 08/22/2020   ALKPHOS 61 08/22/2020   BILITOT <0.2 08/22/2020   Lab Results  Component Value Date   HGBA1C 5.8 (H) 08/22/2020   HGBA1C 5.4 03/15/2019   HGBA1C 5.4 08/23/2017   HGBA1C 5.3 08/22/2016   HGBA1C 5.5 04/20/2016   Lab Results  Component Value Date   INSULIN 7.8 08/22/2020   Lab Results  Component Value Date   TSH 1.250 08/22/2020   Lab Results  Component Value Date   CHOL 174 08/01/2020   HDL 69 08/01/2020   LDLCALC 91 08/01/2020   TRIG 75 08/01/2020   CHOLHDL 2.5 08/01/2020   Lab Results  Component Value Date   WBC 8.5 10/10/2020   HGB 10.1 (L) 10/10/2020   HCT 33.5 (L) 10/10/2020   MCV 79.2 (L) 10/10/2020   PLT 305 10/10/2020   Lab Results  Component Value Date   IRON 73 10/10/2020   TIBC 479 (H) 10/10/2020   FERRITIN 8 (L) 10/10/2020     Attestation Statements:   Reviewed by clinician on day of visit: allergies, medications, problem list, medical history, surgical history, family history, social history, and previous encounter notes.  Coral Ceo, CMA, am acting as transcriptionist for Mina Marble, NP.  I have reviewed the above documentation for accuracy and completeness, and I agree with the above. -  Lorraine Terriquez d. Darald Uzzle, NP-C

## 2020-12-19 ENCOUNTER — Encounter (INDEPENDENT_AMBULATORY_CARE_PROVIDER_SITE_OTHER): Payer: Self-pay | Admitting: Bariatrics

## 2020-12-19 ENCOUNTER — Other Ambulatory Visit: Payer: Self-pay

## 2020-12-19 ENCOUNTER — Ambulatory Visit (INDEPENDENT_AMBULATORY_CARE_PROVIDER_SITE_OTHER): Payer: No Typology Code available for payment source | Admitting: Bariatrics

## 2020-12-19 ENCOUNTER — Other Ambulatory Visit (HOSPITAL_COMMUNITY): Payer: Self-pay

## 2020-12-19 VITALS — BP 133/82 | HR 82 | Ht 71.0 in | Wt 195.0 lb

## 2020-12-19 DIAGNOSIS — Z9189 Other specified personal risk factors, not elsewhere classified: Secondary | ICD-10-CM

## 2020-12-19 DIAGNOSIS — I10 Essential (primary) hypertension: Secondary | ICD-10-CM

## 2020-12-19 DIAGNOSIS — E663 Overweight: Secondary | ICD-10-CM | POA: Diagnosis not present

## 2020-12-19 DIAGNOSIS — R7303 Prediabetes: Secondary | ICD-10-CM

## 2020-12-19 DIAGNOSIS — E669 Obesity, unspecified: Secondary | ICD-10-CM | POA: Diagnosis not present

## 2020-12-19 DIAGNOSIS — Z6830 Body mass index (BMI) 30.0-30.9, adult: Secondary | ICD-10-CM

## 2020-12-19 MED ORDER — SEMAGLUTIDE (1 MG/DOSE) 4 MG/3ML ~~LOC~~ SOPN
1.0000 mg | PEN_INJECTOR | SUBCUTANEOUS | 0 refills | Status: DC
  Filled 2020-12-19: qty 3, 28d supply, fill #0

## 2020-12-24 ENCOUNTER — Inpatient Hospital Stay (HOSPITAL_COMMUNITY): Payer: No Typology Code available for payment source | Attending: Hematology

## 2020-12-24 ENCOUNTER — Other Ambulatory Visit: Payer: Self-pay

## 2020-12-24 DIAGNOSIS — N920 Excessive and frequent menstruation with regular cycle: Secondary | ICD-10-CM | POA: Diagnosis present

## 2020-12-24 DIAGNOSIS — D500 Iron deficiency anemia secondary to blood loss (chronic): Secondary | ICD-10-CM | POA: Diagnosis present

## 2020-12-24 DIAGNOSIS — I10 Essential (primary) hypertension: Secondary | ICD-10-CM | POA: Insufficient documentation

## 2020-12-24 DIAGNOSIS — E119 Type 2 diabetes mellitus without complications: Secondary | ICD-10-CM | POA: Diagnosis not present

## 2020-12-24 LAB — CBC WITH DIFFERENTIAL/PLATELET
Abs Immature Granulocytes: 0.02 10*3/uL (ref 0.00–0.07)
Basophils Absolute: 0 10*3/uL (ref 0.0–0.1)
Basophils Relative: 0 %
Eosinophils Absolute: 0.1 10*3/uL (ref 0.0–0.5)
Eosinophils Relative: 1 %
HCT: 36.9 % (ref 36.0–46.0)
Hemoglobin: 12.2 g/dL (ref 12.0–15.0)
Immature Granulocytes: 0 %
Lymphocytes Relative: 61 %
Lymphs Abs: 5 10*3/uL — ABNORMAL HIGH (ref 0.7–4.0)
MCH: 27.5 pg (ref 26.0–34.0)
MCHC: 33.1 g/dL (ref 30.0–36.0)
MCV: 83.1 fL (ref 80.0–100.0)
Monocytes Absolute: 0.7 10*3/uL (ref 0.1–1.0)
Monocytes Relative: 8 %
Neutro Abs: 2.5 10*3/uL (ref 1.7–7.7)
Neutrophils Relative %: 30 %
Platelets: 224 10*3/uL (ref 150–400)
RBC: 4.44 MIL/uL (ref 3.87–5.11)
RDW: 17.2 % — ABNORMAL HIGH (ref 11.5–15.5)
WBC: 8.3 10*3/uL (ref 4.0–10.5)
nRBC: 0 % (ref 0.0–0.2)

## 2020-12-24 LAB — IRON AND TIBC
Iron: 34 ug/dL (ref 28–170)
Saturation Ratios: 10 % — ABNORMAL LOW (ref 10.4–31.8)
TIBC: 340 ug/dL (ref 250–450)
UIBC: 306 ug/dL

## 2020-12-24 LAB — FERRITIN: Ferritin: 50 ng/mL (ref 11–307)

## 2020-12-25 NOTE — Progress Notes (Signed)
[place], [place] [address]   CLINIC:  Medical Oncology/Hematology  PCP:  Fayrene Helper, MD [address], [address] [place] [address] [phone]   REASON FOR VISIT:  Follow-up for iron deficiency anemia  CURRENT THERAPY: Intermittent IV iron infusions (last Venofer 10/18/2020)  INTERVAL HISTORY:  [name] 50 y.o. female returns for routine follow-up of iron deficiency anemia.  She was last seen by Tarri Abernethy PA-C on 10/31/2020.  At today's visit, she reports feeling fairly well.  No recent hospitalizations, surgeries, or changes in baseline health status. Her energy is improved after IV iron infusions.  She continues to have menorrhagia but denies any other signs or symptoms of blood loss such as bright red blood per rectum, melena, or epistaxis.    No fever, chills, shortness of breath, cough, chest pain, nausea, vomiting, abdominal pain.    She has 75% energy and 100% appetite. She endorses that she is maintaining a stable weight.    REVIEW OF SYSTEMS:  Review of Systems  Constitutional:  Positive for fatigue. Negative for appetite change, chills, diaphoresis, fever and unexpected weight change.  HENT:   Negative for lump/mass and nosebleeds.   Eyes:  Negative for eye problems.  Respiratory:  Negative for cough, hemoptysis and shortness of breath.   Cardiovascular:  Negative for chest pain, leg swelling and palpitations.  Gastrointestinal:  Positive for abdominal pain (mild premenstrual abdominal pain). Negative for blood in stool, constipation, diarrhea, nausea and vomiting.  Genitourinary:  Positive for menstrual problem (menorrhagia). Negative for hematuria.   Skin: Negative.   Neurological:  Negative for dizziness, headaches and light-headedness.  Hematological:  Does not bruise/bleed easily.     PAST MEDICAL/SURGICAL HISTORY:  Past Medical History:  Diagnosis Date   Arthritis    Phreesia  03/29/2020   Back pain    DDD (degenerative disc disease), lumbar    Diabetes mellitus without complication ([place])    Phreesia 03/29/2020   Essential hypertension, benign    GERD (gastroesophageal reflux disease)    Hypertension    Phreesia 03/29/2020   Joint pain    Lactose intolerance    Lumbar herniated disc    Migraine headache 2007   Obesity    Since childhood    Osteoarthritis    Pneumonia 2007   Sacroiliac joint disease    Spinal disease    Supraventricular tachycardia [facility])    Status post RFA July 2015 - Dr. Lovena Le   Type 2 diabetes mellitus ([place])    Varicose vein of leg    Vitamin D deficiency    Weight loss    Hopitalized 4 years ago after loosing 200lb on a supervised program, malnurished with severe protein deficiency    Past Surgical History:  Procedure Laterality Date   ABLATION  12-28-2013   slow pathway modification of AVNRT by Dr Lovena Le   COLONOSCOPY N/A 11/17/2019   Procedure: COLONOSCOPY;  Surgeon: Daneil Dolin, MD;  Location: [facility];  Service: Endoscopy;  Laterality: N/A;  7:30   ELECTROPHYSIOLOGY STUDY N/A 12/28/2013   Procedure: ELECTROPHYSIOLOGY STUDY;  Surgeon: Evans Lance, MD;  Location: [facility] CATH LAB;  Service: Cardiovascular;  Laterality: N/A;   SUPRAVENTRICULAR TACHYCARDIA ABLATION N/A 12/28/2013   Procedure: SUPRAVENTRICULAR TACHYCARDIA ABLATION;  Surgeon: Evans Lance, MD;  Location: [facility] CATH LAB;  Service: Cardiovascular;  Laterality: N/A;   TUBAL LIGATION  1996     SOCIAL HISTORY:  Social History   Socioeconomic  History   Marital status: Married    Spouse name: Not on file   Number of children: 2   Years of education: Not on file   Highest education level: Not on file  Occupational History   Occupation: English as a second language teacher at [employer]   Smoking status: Former    Packs/day: 0.50    Years: 7.00    Pack years: 3.50    Types: Cigarettes    Quit date: 08/05/2012    Years since quitting: 8.3   Smokeless tobacco: Never   Tobacco  comments:    quit in 2014  Vaping Use   Vaping Use: Never used  Substance and Sexual Activity   Alcohol use: No    Alcohol/week: 0.0 standard drinks   Drug use: No   Sexual activity: Yes    Birth control/protection: None, Surgical    Comment: tubal ligation  Other Topics Concern   Not on file  Social History Narrative   Not on file   Social Determinants of Health   Financial Resource Strain: Low Risk    Difficulty of Paying Living Expenses: Not very hard  Food Insecurity: No Food Insecurity   Worried About Charity fundraiser in the Last Year: Never true   [place] in the Last Year: Never true  Transportation Needs: No Transportation Needs   Lack of Transportation (Medical): No   Lack of Transportation (Non-Medical): No  Physical Activity: Inactive   Days of Exercise per Week: 0 days   Minutes of Exercise per Session: 0 min  Stress: No Stress Concern Present   Feeling of Stress : Not at all  Social Connections: Moderately Integrated   Frequency of Communication with Friends and Family: Three times a week   Frequency of Social Gatherings with Friends and Family: Three times a week   Attends Religious Services: More than 4 times per year   Active Member of Clubs or Organizations: No   Attends Archivist Meetings: Never   Marital Status: Married  Human resources officer Violence: Not At Risk   Fear of Current or Ex-Partner: No   Emotionally Abused: No   Physically Abused: No   Sexually Abused: No    FAMILY HISTORY:  Family History  Problem Relation Age of Onset   Hypertension Mother    Diabetes Mother    Cancer Mother        Uterine    Heart failure Mother        CABG   Hyperlipidemia Mother    Heart disease Mother    Thyroid disease Mother    Cancer Father 75       Prostate    Hypertension Father    Bradycardia Father        Irregular heart beat    Hyperlipidemia Father    Kidney disease Father    Diabetes Brother    Diabetes Brother     Hypertension Brother    Hypertension Brother     CURRENT MEDICATIONS:  Outpatient Encounter Medications as of 12/26/2020  Medication Sig   acetaminophen (TYLENOL) 500 MG tablet Take 1,000 mg by mouth every 6 (six) hours as needed (for pain).   Cholecalciferol 25 MCG (1000 UT) tablet Take 1,000 Units by mouth daily.    cyclobenzaprine (FLEXERIL) 10 MG tablet TAKE 1 TABLET BY MOUTH AT BEDTIME   cyclobenzaprine (FLEXERIL) 10 MG tablet TAKE 1 TABLET BY MOUTH AT BEDTIME   Diclofenac-miSOPROStol 50-0.2 MG TBEC TAKE 1 TABLET BY MOUTH  TWICE DAILY AFTER A MEAL   famotidine (PEPCID) 40 MG tablet TAKE ONE TABLET ONCE DAILY WHEN YOU TAKE IBUPROFEN   ferrous sulfate 325 (65 FE) MG tablet Take 325 mg by mouth every other day.   gabapentin (NEURONTIN) 100 MG capsule TAKE ONE TO TWO CAPSULES BY  MOUTH AT BEDTIME FOR BACK PAIN   losartan-hydrochlorothiazide (HYZAAR) 50-12.5 MG tablet TAKE 1 TABLET BY MOUTH ONCE DAILY   Semaglutide, 1 MG/DOSE, 4 MG/3ML SOPN Inject 1 mg into the skin once a week.   No facility-administered encounter medications on file as of 12/26/2020.    ALLERGIES:  Allergies  Allergen Reactions   Other    Codeine Nausea And Vomiting   Metoclopramide Hcl Other (See Comments)    Not in right state of mind     PHYSICAL EXAM:  ECOG PERFORMANCE STATUS: 1 - Symptomatic but completely ambulatory  There were no vitals filed for this visit. There were no vitals filed for this visit. Physical Exam Constitutional:      Appearance: Normal appearance.  HENT:     Head: Normocephalic and atraumatic.     Mouth/Throat:     Mouth: Mucous membranes are moist.  Eyes:     Extraocular Movements: Extraocular movements intact.     Pupils: Pupils are equal, round, and reactive to light.  Cardiovascular:     Rate and Rhythm: Normal rate and regular rhythm.     Pulses: Normal pulses.     Heart sounds: Normal heart sounds.  Pulmonary:     Effort: Pulmonary effort is normal.     Breath sounds:  Normal breath sounds.  Abdominal:     General: Bowel sounds are normal.     Palpations: Abdomen is soft.     Tenderness: There is no abdominal tenderness.  Musculoskeletal:        General: No swelling.     Right lower leg: No edema.     Left lower leg: No edema.  Lymphadenopathy:     Cervical: No cervical adenopathy.  Skin:    General: Skin is warm and dry.  Neurological:     General: No focal deficit present.     Mental Status: She is alert and oriented to person, place, and time.  Psychiatric:        Mood and Affect: Mood normal.        Behavior: Behavior normal.     LABORATORY DATA:  I have reviewed the labs as listed.  CBC    Component Value Date/Time   WBC 8.3 12/24/2020 0803   RBC 4.44 12/24/2020 0803   HGB 12.2 12/24/2020 0803   HGB 9.9 (L) 09/23/2020 1501   HCT 36.9 12/24/2020 0803   HCT 31.9 (L) 09/23/2020 1501   PLT 224 12/24/2020 0803   PLT 281 09/23/2020 1501   MCV 83.1 12/24/2020 0803   MCV 77 (L) 09/23/2020 1501   MCH 27.5 12/24/2020 0803   MCHC 33.1 12/24/2020 0803   RDW 17.2 (H) 12/24/2020 0803   RDW 15.8 (H) 09/23/2020 1501   LYMPHSABS 5.0 (H) 12/24/2020 0803   LYMPHSABS 5.0 (H) 08/22/2020 0956   MONOABS 0.7 12/24/2020 0803   EOSABS 0.1 12/24/2020 0803   EOSABS 0.1 08/22/2020 0956   BASOSABS 0.0 12/24/2020 0803   BASOSABS 0.1 08/22/2020 0956   CMP Latest Ref Rng & Units 08/22/2020 08/01/2020 12/07/2019  Glucose 65 - 99 mg/dL 82 85 89  BUN 6 - 24 mg/dL 12 14 13   Creatinine 0.57 - 1.00 mg/dL 0.78  0.93 0.80  Sodium 134 - 144 mmol/L 136 140 139  Potassium 3.5 - 5.2 mmol/L 4.0 4.2 3.5  Chloride 96 - 106 mmol/L 99 101 98  CO2 20 - 29 mmol/L 20 22 25   Calcium 8.7 - 10.2 mg/dL 9.2 9.5 9.4  Total Protein 6.0 - 8.5 g/dL 7.5 - -  Total Bilirubin 0.0 - 1.2 mg/dL <0.2 - -  Alkaline Phos 44 - 121 IU/L 61 - -  AST 0 - 40 IU/L 14 - -  ALT 0 - 32 IU/L 7 - -    DIAGNOSTIC IMAGING:  I have independently reviewed the relevant imaging and discussed with the  patient.  ASSESSMENT & PLAN: 1.  Iron deficiency anemia -  Secondary to chronic blood loss (menorrhagia) and malabsorption in the setting of PPI use - Failed to improve on oral iron tablets - Hemoccult stool x3 negative, denies melena or bright red blood per rectum - Work-up for other causes of anemia unremarkable (normal methylmalonic acid, B12, folate, copper, LDH, IFE/SPEP/FLC); CMP (08/22/2020) showed normal creatinine 0.78, normal liver function. - Normal screening colonoscopy in May 2021; no history of EGD - Symptomatic with fatigue, pica, lightheadedness - Received IV Venofer x3 (last dose 10/18/2020) - Most recent labs (12/24/2020): Hgb 12.2 with MCV 83.1, ferritin 50, 10% iron saturation - PLAN: IV Venofer x3.  Labs and RTC in 12 weeks.    2.  Other history - Her other past medical history is significant for chronic back pain, history of SVT s/p ablation, hypertension, diet-controlled diabetes mellitus type 2. 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  She is former smoker (10 pack-year history), quit over 10 years ago.  She denies alcohol or drug use. - Patient's mother had uterine caner, father with prostate cancer.  No other known family history of anemia or cancers.   PLAN SUMMARY & DISPOSITION: -IV Venofer x3 - Labs and RTC in 3 months  All questions were answered. The patient knows to call the clinic with any problems, questions or concerns.  Medical decision making: Low  Time spent on visit: I spent 15 minutes counseling the patient face to face. The total time spent in the appointment was 25 minutes and more than 50% was on counseling.   Harriett Rush, PA-C  12/26/2020 11:00 AM

## 2020-12-26 ENCOUNTER — Encounter: Payer: Self-pay | Admitting: Family Medicine

## 2020-12-26 ENCOUNTER — Other Ambulatory Visit: Payer: Self-pay

## 2020-12-26 ENCOUNTER — Encounter (HOSPITAL_COMMUNITY): Payer: Self-pay | Admitting: Physician Assistant

## 2020-12-26 ENCOUNTER — Inpatient Hospital Stay (HOSPITAL_BASED_OUTPATIENT_CLINIC_OR_DEPARTMENT_OTHER): Payer: No Typology Code available for payment source | Admitting: Physician Assistant

## 2020-12-26 VITALS — BP 121/84 | Temp 98.0°F | Resp 16 | Wt 197.8 lb

## 2020-12-26 DIAGNOSIS — D500 Iron deficiency anemia secondary to blood loss (chronic): Secondary | ICD-10-CM | POA: Diagnosis not present

## 2020-12-26 DIAGNOSIS — N920 Excessive and frequent menstruation with regular cycle: Secondary | ICD-10-CM | POA: Diagnosis not present

## 2020-12-26 NOTE — Patient Instructions (Signed)
[name] at [facility] Discharge Instructions  You were seen today by Tarri Abernethy PA-C for your iron deficiency anemia.  As we have discussed, this is due to chronic blood loss related to your heavy periods.  Your blood counts have improved and are now within the normal range after getting IV iron.  However, your iron stores do remain low.  I recommend another 3 doses of Venofer (IV iron) to improve your iron storage.  This may also help to improve your fatigue.  LABS: Return in 3 months for labs  FOLLOW-UP APPOINTMENT: Office visit in 3 months after repeat labs   Thank you for choosing [facility] to provide your oncology and hematology care.  To afford each patient quality time with our provider, please arrive at least 15 minutes before your scheduled appointment time.   If you have a lab appointment with the [facility] please come in thru the Main Entrance and check in at the main information desk.  You need to re-schedule your appointment should you arrive 10 or more minutes late.  We strive to give you quality time with our providers, and arriving late affects you and other patients whose appointments are after yours.  Also, if you no show three or more times for appointments you may be dismissed from the clinic at the providers discretion.     Again, thank you for choosing [facility].  Our hope is that these requests will decrease the amount of time that you wait before being seen by our physicians.       _____________________________________________________________  Should you have questions after your visit to [facility], please contact our office at [phone] and follow the prompts.  Our office hours are 8:00 a.m. and 4:30 p.m. Monday - Friday.  Please note that voicemails left after 4:00 p.m. may not be returned until the following business day.  We are closed weekends and major  holidays.  You do have access to a nurse 24-7, just call the main number to the clinic [phone] and do not press any options, hold on the line and a nurse will answer the phone.    For prescription refill requests, have your pharmacy contact our office and allow 72 hours.    Due to Covid, you will need to wear a mask upon entering the hospital. If you do not have a mask, a mask will be given to you at the Main Entrance upon arrival. For doctor visits, patients may have 1 support person age 21 or older with them. For treatment visits, patients can not have anyone with them due to social distancing guidelines and our immunocompromised population.

## 2020-12-26 NOTE — Progress Notes (Signed)
Chief Complaint:   OBESITY [name] is here to discuss her progress with her obesity treatment plan along with follow-up of her obesity related diagnoses. [name] is on the Category 3 Plan and states she is following her eating plan approximately 96% of the time. [name] states she is walking 30 minutes 1-2 times per week.  Today's visit was #: 6 Starting weight: 205 lbs Starting date: 08/22/2020 Today's weight: 195 lbs Today's date: 12/19/2020 Total lbs lost to date: 10 lbs Total lbs lost since last in-office visit: 1 lb  Interim History: [name] is down an additional 1 lb since her last visit.  Subjective:   1. Pre-diabetes [name] denies any side effects for polyphagia.  2. Essential hypertension [name]'s hypertension is controlled.  3. At risk for hypoglycemia [name] is at increased risk for hypoglycemia due to pre-diabetes.   Assessment/Plan:   1. Pre-diabetes [name] agreed to increase Ozempic to 1 mg SubQ weekly with no refills. She will continue to work on weight loss, exercise, and decreasing simple carbohydrates to help decrease the risk of diabetes.   2. Essential hypertension [name] will continue her medications, and will continue working on healthy weight loss and exercise to improve blood pressure control. No added salt. We will watch for signs of hypotension as she continues her lifestyle modifications.  3. At risk for hypoglycemia [name] was given approximately 15 minutes of counseling today regarding prevention of hypoglycemia. She was advised of symptoms of hypoglycemia. Ramsie was instructed to avoid skipping meals, eat regular protein rich meals and schedule low calorie snacks as needed.   Repetitive spaced learning was employed today to elicit superior memory formation and behavioral change  4. Obesity, current BMI 27.2 [name] is currently in the action stage of change. As such, her goal is to continue with weight loss efforts. She has agreed to the Category  3 Plan.   [name] continues with her meal planning and intentional eating.  Exercise goals:  As is.  Behavioral modification strategies: increasing lean protein intake, decreasing simple carbohydrates, increasing vegetables, increasing water intake, decreasing eating out, no skipping meals, meal planning and cooking strategies, and keeping healthy foods in the home.  [name] has agreed to follow-up with our clinic in 7 weeks. She was informed of the importance of frequent follow-up visits to maximize her success with intensive lifestyle modifications for her multiple health conditions.   Objective:   Blood pressure 133/82, pulse 82, height 5\' 11"  (1.803 m), weight 195 lb (88.5 kg), SpO2 100 %. Body mass index is 27.2 kg/m.  General: Cooperative, alert, well developed, in no acute distress. HEENT: Conjunctivae and lids unremarkable. Cardiovascular: Regular rhythm.  Lungs: Normal work of breathing. Neurologic: No focal deficits.   Lab Results  Component Value Date   CREATININE 0.78 08/22/2020   BUN 12 08/22/2020   NA 136 08/22/2020   K 4.0 08/22/2020   CL 99 08/22/2020   CO2 20 08/22/2020   Lab Results  Component Value Date   ALT 7 08/22/2020   AST 14 08/22/2020   ALKPHOS 61 08/22/2020   BILITOT <0.2 08/22/2020   Lab Results  Component Value Date   HGBA1C 5.8 (H) 08/22/2020   HGBA1C 5.4 03/15/2019   HGBA1C 5.4 08/23/2017   HGBA1C 5.3 08/22/2016   HGBA1C 5.5 04/20/2016   Lab Results  Component Value Date   INSULIN 7.8 08/22/2020   Lab Results  Component Value Date   TSH 1.250 08/22/2020   Lab Results  Component Value Date  CHOL 174 08/01/2020   HDL 69 08/01/2020   LDLCALC 91 08/01/2020   TRIG 75 08/01/2020   CHOLHDL 2.5 08/01/2020   Lab Results  Component Value Date   VD25OH 38.5 08/22/2020   VD25OH 30.4 12/07/2019   VD25OH 26 (L) 03/15/2019   Lab Results  Component Value Date   WBC 8.3 12/24/2020   HGB 12.2 12/24/2020   HCT 36.9 12/24/2020   MCV  83.1 12/24/2020   PLT 224 12/24/2020   Lab Results  Component Value Date   IRON 34 12/24/2020   TIBC 340 12/24/2020   FERRITIN 50 12/24/2020   Attestation Statements:   Reviewed by clinician on day of visit: allergies, medications, problem list, medical history, surgical history, family history, social history, and previous encounter notes.  I, Lizbeth Bark, RMA, am acting as Location manager for [employer], DO.   I have reviewed the above documentation for accuracy and completeness, and I agree with the above. Jearld Lesch, DO

## 2020-12-27 ENCOUNTER — Other Ambulatory Visit: Payer: Self-pay

## 2020-12-27 DIAGNOSIS — N939 Abnormal uterine and vaginal bleeding, unspecified: Secondary | ICD-10-CM

## 2020-12-27 NOTE — Telephone Encounter (Signed)
Referral sent to [facility].

## 2020-12-28 ENCOUNTER — Encounter (INDEPENDENT_AMBULATORY_CARE_PROVIDER_SITE_OTHER): Payer: Self-pay | Admitting: Bariatrics

## 2020-12-30 ENCOUNTER — Inpatient Hospital Stay (HOSPITAL_COMMUNITY): Payer: No Typology Code available for payment source

## 2020-12-30 ENCOUNTER — Other Ambulatory Visit: Payer: Self-pay

## 2020-12-30 VITALS — BP 94/61 | HR 69 | Temp 96.9°F | Resp 17

## 2020-12-30 DIAGNOSIS — N920 Excessive and frequent menstruation with regular cycle: Secondary | ICD-10-CM | POA: Diagnosis not present

## 2020-12-30 DIAGNOSIS — D500 Iron deficiency anemia secondary to blood loss (chronic): Secondary | ICD-10-CM

## 2020-12-30 MED ORDER — SODIUM CHLORIDE 0.9 % IV SOLN: Freq: Once | INTRAVENOUS | Status: AC

## 2020-12-30 MED ORDER — LORATADINE 10 MG PO TABS
10.0000 mg | ORAL_TABLET | Freq: Once | ORAL | Status: AC
  Administered 2020-12-30: 10 mg via ORAL

## 2020-12-30 MED ORDER — ACETAMINOPHEN 325 MG PO TABS
650.0000 mg | ORAL_TABLET | Freq: Once | ORAL | Status: AC
  Administered 2020-12-30: 650 mg via ORAL

## 2020-12-30 MED ORDER — ACETAMINOPHEN 325 MG PO TABS
ORAL_TABLET | ORAL | Status: AC
  Filled 2020-12-30: qty 2

## 2020-12-30 MED ORDER — LORATADINE 10 MG PO TABS
ORAL_TABLET | ORAL | Status: AC
  Filled 2020-12-30: qty 1

## 2020-12-30 MED ORDER — SODIUM CHLORIDE 0.9 % IV SOLN
300.0000 mg | Freq: Once | INTRAVENOUS | Status: AC
  Administered 2020-12-30: 300 mg via INTRAVENOUS
  Filled 2020-12-30: qty 300

## 2020-12-30 NOTE — Patient Instructions (Signed)
[facility]  Discharge Instructions: Thank you for choosing [facility] to provide your oncology and hematology care.  If you have a lab appointment with the Cancer Center, please come in thru the Main Entrance and check in at the main information desk.  Wear comfortable clothing and clothing appropriate for easy access to any Portacath or PICC line.   We strive to give you quality time with your provider. You may need to reschedule your appointment if you arrive late (15 or more minutes).  Arriving late affects you and other patients whose appointments are after yours.  Also, if you miss three or more appointments without notifying the office, you may be dismissed from the clinic at the provider's discretion.      For prescription refill requests, have your pharmacy contact our office and allow 72 hours for refills to be completed.    Today you received Venofer 300 mg IV iron infusion.  BELOW ARE SYMPTOMS THAT SHOULD BE REPORTED IMMEDIATELY: *FEVER GREATER THAN 100.4 F (38 C) OR HIGHER *CHILLS OR SWEATING *NAUSEA AND VOMITING THAT IS NOT CONTROLLED WITH YOUR NAUSEA MEDICATION *UNUSUAL SHORTNESS OF BREATH *UNUSUAL BRUISING OR BLEEDING *URINARY PROBLEMS (pain or burning when urinating, or frequent urination) *BOWEL PROBLEMS (unusual diarrhea, constipation, pain near the anus) TENDERNESS IN MOUTH AND THROAT WITH OR WITHOUT PRESENCE OF ULCERS (sore throat, sores in mouth, or a toothache) UNUSUAL RASH, SWELLING OR PAIN  UNUSUAL VAGINAL DISCHARGE OR ITCHING   Items with * indicate a potential emergency and should be followed up as soon as possible or go to the Emergency Department if any problems should occur.  Please show the CHEMOTHERAPY ALERT CARD or IMMUNOTHERAPY ALERT CARD at check-in to the Emergency Department and triage nurse.  Should you have questions after your visit or need to cancel or reschedule your appointment, please contact [facility]  [phone]  and follow the prompts.  Office hours are 8:00 a.m. to 4:30 p.m. Monday - Friday. Please note that voicemails left after 4:00 p.m. may not be returned until the following business day.  We are closed weekends and major holidays. You have access to a nurse at all times for urgent questions. Please call the main number to the clinic [phone] and follow the prompts.  For any non-urgent questions, you may also contact your provider using MyChart. We now offer e-Visits for anyone 18 and older to request care online for non-urgent symptoms. For details visit [email or web address].   Also download the MyChart app! Go to the app store, search "MyChart", open the app, select [facility], and log in with your MyChart username and password.  Due to Covid, a mask is required upon entering the hospital/clinic. If you do not have a mask, one will be given to you upon arrival. For doctor visits, patients may have 1 support person aged 18 or older with them. For treatment visits, patients cannot have anyone with them due to current Covid guidelines and our immunocompromised population.  

## 2020-12-30 NOTE — Progress Notes (Signed)
Venofer 300 mg IV iron infusion given today per MD orders. Tolerated infusion without adverse affects. Vital signs stable. Pt stable during and after treatment. No complaints at this time. Discharged from clinic ambulatory in stable condition. Alert and oriented x 3. F/U with [facility] as scheduled.

## 2021-01-01 ENCOUNTER — Encounter (HOSPITAL_COMMUNITY): Payer: Self-pay

## 2021-01-01 NOTE — Progress Notes (Signed)
Patient called today reporting a small, isolated rash to one arm that started on Tuesday after receiving iron infusion on Monday. Patient states that she is applying benadryl cream and taking po benadryl as needed for the noted rash. Patient reports no other symptoms. Eugene Garnet, PA made aware. Patient advised to continue current OTC treatment and to report to the ED should any other symptoms arise such as worsening rash, swelling or difficulty breathing. Patient verbalized understanding.

## 2021-01-02 ENCOUNTER — Other Ambulatory Visit (HOSPITAL_COMMUNITY): Payer: Self-pay | Admitting: Physician Assistant

## 2021-01-02 NOTE — Progress Notes (Signed)
Due to possible mild reaction (isolated unilateral rash on arm) following last IV Feraheme infusion, will add premedication with 50 mg Benadryl to next IV iron treatment.  If patient has any further signs or symptoms of reaction, will consider changing form of IV iron.

## 2021-01-06 ENCOUNTER — Inpatient Hospital Stay (HOSPITAL_COMMUNITY): Payer: No Typology Code available for payment source

## 2021-01-06 ENCOUNTER — Other Ambulatory Visit: Payer: Self-pay

## 2021-01-06 ENCOUNTER — Encounter (HOSPITAL_COMMUNITY): Payer: Self-pay

## 2021-01-06 VITALS — BP 97/62 | HR 64 | Temp 96.7°F | Resp 17

## 2021-01-06 DIAGNOSIS — N920 Excessive and frequent menstruation with regular cycle: Secondary | ICD-10-CM | POA: Diagnosis not present

## 2021-01-06 DIAGNOSIS — D500 Iron deficiency anemia secondary to blood loss (chronic): Secondary | ICD-10-CM

## 2021-01-06 MED ORDER — ACETAMINOPHEN 325 MG PO TABS
650.0000 mg | ORAL_TABLET | Freq: Once | ORAL | Status: AC
  Administered 2021-01-06: 650 mg via ORAL
  Filled 2021-01-06: qty 2

## 2021-01-06 MED ORDER — LORATADINE 10 MG PO TABS: 10.0000 mg | ORAL_TABLET | Freq: Once | ORAL | Status: DC

## 2021-01-06 MED ORDER — DIPHENHYDRAMINE HCL 25 MG PO CAPS
50.0000 mg | ORAL_CAPSULE | Freq: Once | ORAL | Status: AC
  Administered 2021-01-06: 50 mg via ORAL
  Filled 2021-01-06: qty 2

## 2021-01-06 MED ORDER — SODIUM CHLORIDE 0.9 % IV SOLN
300.0000 mg | Freq: Once | INTRAVENOUS | Status: AC
  Administered 2021-01-06: 300 mg via INTRAVENOUS
  Filled 2021-01-06: qty 300

## 2021-01-06 MED ORDER — SODIUM CHLORIDE 0.9 % IV SOLN: Freq: Once | INTRAVENOUS | Status: AC

## 2021-01-06 MED ORDER — DIPHENHYDRAMINE HCL 25 MG PO CAPS: 50.0000 mg | ORAL_CAPSULE | Freq: Once | ORAL | Status: DC

## 2021-01-06 NOTE — Progress Notes (Signed)
Patient tolerated iron infusion with no complaints voiced. Tylenol and Benadryl given 30 mins before treatment. Peripheral IV site clean and dry with good blood return noted before and after infusion. Band aid applied. VSS with discharge and left in satisfactory condition with no s/s of distress noted.

## 2021-01-06 NOTE — Patient Instructions (Signed)
[facility]  Discharge Instructions: Thank you for choosing [facility] to provide your oncology and hematology care.  If you have a lab appointment with the Cancer Center, please come in thru the Main Entrance and check in at the main information desk.  Wear comfortable clothing and clothing appropriate for easy access to any Portacath or PICC line.   We strive to give you quality time with your provider. You may need to reschedule your appointment if you arrive late (15 or more minutes).  Arriving late affects you and other patients whose appointments are after yours.  Also, if you miss three or more appointments without notifying the office, you may be dismissed from the clinic at the provider's discretion.      For prescription refill requests, have your pharmacy contact our office and allow 72 hours for refills to be completed.    Today you received the following: Venofer, return as scheduled.   To help prevent nausea and vomiting after your treatment, we encourage you to take your nausea medication as directed.  BELOW ARE SYMPTOMS THAT SHOULD BE REPORTED IMMEDIATELY: *FEVER GREATER THAN 100.4 F (38 C) OR HIGHER *CHILLS OR SWEATING *NAUSEA AND VOMITING THAT IS NOT CONTROLLED WITH YOUR NAUSEA MEDICATION *UNUSUAL SHORTNESS OF BREATH *UNUSUAL BRUISING OR BLEEDING *URINARY PROBLEMS (pain or burning when urinating, or frequent urination) *BOWEL PROBLEMS (unusual diarrhea, constipation, pain near the anus) TENDERNESS IN MOUTH AND THROAT WITH OR WITHOUT PRESENCE OF ULCERS (sore throat, sores in mouth, or a toothache) UNUSUAL RASH, SWELLING OR PAIN  UNUSUAL VAGINAL DISCHARGE OR ITCHING   Items with * indicate a potential emergency and should be followed up as soon as possible or go to the Emergency Department if any problems should occur.  Please show the CHEMOTHERAPY ALERT CARD or IMMUNOTHERAPY ALERT CARD at check-in to the Emergency Department and triage  nurse.  Should you have questions after your visit or need to cancel or reschedule your appointment, please contact [facility] [phone]  and follow the prompts.  Office hours are 8:00 a.m. to 4:30 p.m. Monday - Friday. Please note that voicemails left after 4:00 p.m. may not be returned until the following business day.  We are closed weekends and major holidays. You have access to a nurse at all times for urgent questions. Please call the main number to the clinic [phone] and follow the prompts.  For any non-urgent questions, you may also contact your provider using MyChart. We now offer e-Visits for anyone 18 and older to request care online for non-urgent symptoms. For details visit [email or web address].   Also download the MyChart app! Go to the app store, search "MyChart", open the app, select , and log in with your MyChart username and password.  Due to Covid, a mask is required upon entering the hospital/clinic. If you do not have a mask, one will be given to you upon arrival. For doctor visits, patients may have 1 support person aged 18 or older with them. For treatment visits, patients cannot have anyone with them due to current Covid guidelines and our immunocompromised population.  

## 2021-01-13 ENCOUNTER — Encounter (INDEPENDENT_AMBULATORY_CARE_PROVIDER_SITE_OTHER): Payer: Self-pay | Admitting: Bariatrics

## 2021-01-13 ENCOUNTER — Other Ambulatory Visit: Payer: Self-pay

## 2021-01-13 ENCOUNTER — Inpatient Hospital Stay (HOSPITAL_COMMUNITY): Payer: No Typology Code available for payment source

## 2021-01-13 ENCOUNTER — Other Ambulatory Visit (HOSPITAL_COMMUNITY): Payer: Self-pay

## 2021-01-13 ENCOUNTER — Ambulatory Visit (INDEPENDENT_AMBULATORY_CARE_PROVIDER_SITE_OTHER): Payer: No Typology Code available for payment source | Admitting: Bariatrics

## 2021-01-13 ENCOUNTER — Encounter (HOSPITAL_COMMUNITY): Payer: Self-pay

## 2021-01-13 VITALS — BP 106/63 | HR 83 | Temp 96.8°F | Resp 18

## 2021-01-13 VITALS — BP 108/74 | HR 79 | Temp 97.5°F | Ht 71.0 in | Wt 191.0 lb

## 2021-01-13 DIAGNOSIS — Z6830 Body mass index (BMI) 30.0-30.9, adult: Secondary | ICD-10-CM | POA: Diagnosis not present

## 2021-01-13 DIAGNOSIS — Z9189 Other specified personal risk factors, not elsewhere classified: Secondary | ICD-10-CM

## 2021-01-13 DIAGNOSIS — D500 Iron deficiency anemia secondary to blood loss (chronic): Secondary | ICD-10-CM

## 2021-01-13 DIAGNOSIS — N920 Excessive and frequent menstruation with regular cycle: Secondary | ICD-10-CM | POA: Diagnosis not present

## 2021-01-13 DIAGNOSIS — I10 Essential (primary) hypertension: Secondary | ICD-10-CM | POA: Diagnosis not present

## 2021-01-13 DIAGNOSIS — R7303 Prediabetes: Secondary | ICD-10-CM

## 2021-01-13 DIAGNOSIS — E669 Obesity, unspecified: Secondary | ICD-10-CM

## 2021-01-13 MED ORDER — SODIUM CHLORIDE 0.9 % IV SOLN: Freq: Once | INTRAVENOUS | Status: AC

## 2021-01-13 MED ORDER — ACETAMINOPHEN 325 MG PO TABS
650.0000 mg | ORAL_TABLET | Freq: Once | ORAL | Status: AC
  Administered 2021-01-13: 650 mg via ORAL
  Filled 2021-01-13: qty 2

## 2021-01-13 MED ORDER — SEMAGLUTIDE (1 MG/DOSE) 4 MG/3ML ~~LOC~~ SOPN
1.0000 mg | PEN_INJECTOR | SUBCUTANEOUS | 0 refills | Status: DC
  Filled 2021-01-13: qty 3, 28d supply, fill #0

## 2021-01-13 MED ORDER — DIPHENHYDRAMINE HCL 25 MG PO CAPS
50.0000 mg | ORAL_CAPSULE | Freq: Once | ORAL | Status: AC
  Administered 2021-01-13: 50 mg via ORAL
  Filled 2021-01-13: qty 2

## 2021-01-13 MED ORDER — SODIUM CHLORIDE 0.9 % IV SOLN
400.0000 mg | Freq: Once | INTRAVENOUS | Status: AC
  Administered 2021-01-13: 400 mg via INTRAVENOUS
  Filled 2021-01-13: qty 20

## 2021-01-13 NOTE — Patient Instructions (Signed)
[name]  Discharge Instructions: Thank you for choosing [facility] to provide your oncology and hematology care.  If you have a lab appointment with the [facility], please come in thru the Main Entrance and check in at the main information desk.  Wear comfortable clothing and clothing appropriate for easy access to any Portacath or PICC line.   We strive to give you quality time with your provider. You may need to reschedule your appointment if you arrive late (15 or more minutes).  Arriving late affects you and other patients whose appointments are after yours.  Also, if you miss three or more appointments without notifying the office, you may be dismissed from the clinic at the provider's discretion.      For prescription refill requests, have your pharmacy contact our office and allow 72 hours for refills to be completed.    Today you received the following: Venofer. Return as scheduled.   To help prevent nausea and vomiting after your treatment, we encourage you to take your nausea medication as directed.  BELOW ARE SYMPTOMS THAT SHOULD BE REPORTED IMMEDIATELY: *FEVER GREATER THAN 100.4 F (38 C) OR HIGHER *CHILLS OR SWEATING *NAUSEA AND VOMITING THAT IS NOT CONTROLLED WITH YOUR NAUSEA MEDICATION *UNUSUAL SHORTNESS OF BREATH *UNUSUAL BRUISING OR BLEEDING *URINARY PROBLEMS (pain or burning when urinating, or frequent urination) *BOWEL PROBLEMS (unusual diarrhea, constipation, pain near the anus) TENDERNESS IN MOUTH AND THROAT WITH OR WITHOUT PRESENCE OF ULCERS (sore throat, sores in mouth, or a toothache) UNUSUAL RASH, SWELLING OR PAIN  UNUSUAL VAGINAL DISCHARGE OR ITCHING   Items with * indicate a potential emergency and should be followed up as soon as possible or go to the Emergency Department if any problems should occur.  Please show the CHEMOTHERAPY ALERT CARD or IMMUNOTHERAPY ALERT CARD at check-in to the Emergency Department and triage  nurse.  Should you have questions after your visit or need to cancel or reschedule your appointment, please contact [facility] [phone]  and follow the prompts.  Office hours are 8:00 a.m. to 4:30 p.m. Monday - Friday. Please note that voicemails left after 4:00 p.m. may not be returned until the following business day.  We are closed weekends and major holidays. You have access to a nurse at all times for urgent questions. Please call the main number to the clinic [phone] and follow the prompts.  For any non-urgent questions, you may also contact your provider using MyChart. We now offer e-Visits for anyone 29 and older to request care online for non-urgent symptoms. For details visit [email or web address].   Also download the MyChart app! Go to the app store, search "MyChart", open the app, select [facility], and log in with your MyChart username and password.  Due to Covid, a mask is required upon entering the hospital/clinic. If you do not have a mask, one will be given to you upon arrival. For doctor visits, patients may have 1 support person aged 50 or older with them. For treatment visits, patients cannot have anyone with them due to current Covid guidelines and our immunocompromised population.

## 2021-01-13 NOTE — Progress Notes (Signed)
Patient tolerated iron infusion with no complaints voiced.  Peripheral IV site clean and dry with good blood return noted before and after infusion.  Band aid applied.  VSS with discharge and left in satisfactory condition with no s/s of distress noted.   

## 2021-01-17 ENCOUNTER — Encounter (INDEPENDENT_AMBULATORY_CARE_PROVIDER_SITE_OTHER): Payer: Self-pay | Admitting: Bariatrics

## 2021-01-17 ENCOUNTER — Other Ambulatory Visit: Payer: Self-pay | Admitting: Family Medicine

## 2021-01-17 NOTE — Progress Notes (Signed)
Chief Complaint:   OBESITY [name] is here to discuss her progress with her obesity treatment plan along with follow-up of her obesity related diagnoses. [name] is on the Category 3 Plan and states she is following her eating plan approximately 96% of the time. [name] states she is not currently exercising.   Today's visit was #: 7 Starting weight: 205 lbs Starting date: 08/22/2020 Today's weight: 191 lbs Today's date: 01/17/2021 Total lbs lost to date: 14 lbs Total lbs lost since last in-office visit: 4 lbs  Interim History: [name] is down another 4 lbs since her last visit.  Her goal is a weight of 185 lbs  Subjective:   1. Pre-diabetes [name] has a diagnosis of prediabetes based on her elevated HgA1c and was informed this puts her at greater risk of developing diabetes. She continues to work on diet and exercise to decrease her risk of diabetes. She denies nausea or hypoglycemia. She is currently taking Ozempic.  Lab Results  Component Value Date   HGBA1C 5.8 (H) 08/22/2020   Lab Results  Component Value Date   INSULIN 7.8 08/22/2020    2. Essential hypertension Review: taking medications as instructed, no medication side effects noted, no chest pain on exertion, no dyspnea on exertion, no swelling of ankles. [name]'s blood pressure is controlled.  BP Readings from Last 3 Encounters:  01/13/21 106/63  01/13/21 108/74  01/06/21 97/62    3. At risk for side effect of medication [name] was given approximately 15 minutes of drug side effect counseling today.  We discussed side effect possibility and risk versus benefits. [name] agreed to the medication and will contact this office if these side effects are intolerable.  Repetitive spaced learning was employed today to elicit superior memory formation and behavioral change.   Assessment/Plan:   1. Pre-diabetes [name] will continue to work on weight loss, exercise, and decreasing simple carbohydrates to help decrease the  risk of diabetes.   - Semaglutide, 1 MG/DOSE, 4 MG/3ML SOPN; Inject 1 mg into the skin once a week.  Dispense: 3 mL; Refill: 0  2. Essential hypertension [name] is working on healthy weight loss and exercise to improve blood pressure control. We will watch for signs of hypotension as she continues her lifestyle modifications. [name] will continue her medicaiton.  3. At risk for side effect of medication [name] was given approximately 15 minutes of drug side effect counseling today.  We discussed side effect possibility and risk versus benefits. [name] agreed to the medication and will contact this office if these side effects are intolerable.  Repetitive spaced learning was employed today to elicit superior memory formation and behavioral change.   4. Obesity, current BMI 26.7 [name] is currently in the action stage of change. As such, her goal is to continue with weight loss efforts. She has agreed to the Category 3 Plan.   Meal planning Intentional eating Keep protein high Smart fruit sheet provided  Exercise goals: As is  Behavioral modification strategies: increasing lean protein intake, decreasing simple carbohydrates, increasing vegetables, increasing water intake, decreasing eating out, no skipping meals, meal planning and cooking strategies, keeping healthy foods in the home, and planning for success.  [name] has agreed to follow-up with our clinic in 3 weeks. She was informed of the importance of frequent follow-up visits to maximize her success with intensive lifestyle modifications for her multiple health conditions.   Objective:   Blood pressure 108/74, pulse 79, temperature (!) 97.5 F (36.4 C), height 5'  11" (1.803 m), weight 191 lb (86.6 kg), SpO2 95 %. Body mass index is 26.64 kg/m.  General: Cooperative, alert, well developed, in no acute distress. HEENT: Conjunctivae and lids unremarkable. Cardiovascular: Regular rhythm.  Lungs: Normal work of  breathing. Neurologic: No focal deficits.   Lab Results  Component Value Date   CREATININE 0.78 08/22/2020   BUN 12 08/22/2020   NA 136 08/22/2020   K 4.0 08/22/2020   CL 99 08/22/2020   CO2 20 08/22/2020   Lab Results  Component Value Date   ALT 7 08/22/2020   AST 14 08/22/2020   ALKPHOS 61 08/22/2020   BILITOT <0.2 08/22/2020   Lab Results  Component Value Date   HGBA1C 5.8 (H) 08/22/2020   HGBA1C 5.4 03/15/2019   HGBA1C 5.4 08/23/2017   HGBA1C 5.3 08/22/2016   HGBA1C 5.5 04/20/2016   Lab Results  Component Value Date   INSULIN 7.8 08/22/2020   Lab Results  Component Value Date   TSH 1.250 08/22/2020   Lab Results  Component Value Date   CHOL 174 08/01/2020   HDL 69 08/01/2020   LDLCALC 91 08/01/2020   TRIG 75 08/01/2020   CHOLHDL 2.5 08/01/2020   Lab Results  Component Value Date   VD25OH 38.5 08/22/2020   VD25OH 30.4 12/07/2019   VD25OH 26 (L) 03/15/2019   Lab Results  Component Value Date   WBC 8.3 12/24/2020   HGB 12.2 12/24/2020   HCT 36.9 12/24/2020   MCV 83.1 12/24/2020   PLT 224 12/24/2020   Lab Results  Component Value Date   IRON 34 12/24/2020   TIBC 340 12/24/2020   FERRITIN 50 12/24/2020   Attestation Statements:   Reviewed by clinician on day of visit: allergies, medications, problem list, medical history, surgical history, family history, social history, and previous encounter notes.  ILennette Ortiz, CMA, am acting as transcriptionist for Dr. Jearld Lesch, DO.  I have reviewed the above documentation for accuracy and completeness, and I agree with the above. Kelsey Lesch, DO

## 2021-01-20 ENCOUNTER — Encounter: Payer: Self-pay | Admitting: Adult Health

## 2021-01-20 ENCOUNTER — Other Ambulatory Visit (HOSPITAL_COMMUNITY): Payer: Self-pay

## 2021-01-20 ENCOUNTER — Ambulatory Visit (INDEPENDENT_AMBULATORY_CARE_PROVIDER_SITE_OTHER): Payer: No Typology Code available for payment source | Admitting: Adult Health

## 2021-01-20 ENCOUNTER — Other Ambulatory Visit: Payer: Self-pay

## 2021-01-20 VITALS — BP 115/74 | HR 90 | Ht 71.0 in | Wt 193.5 lb

## 2021-01-20 DIAGNOSIS — N921 Excessive and frequent menstruation with irregular cycle: Secondary | ICD-10-CM | POA: Insufficient documentation

## 2021-01-20 DIAGNOSIS — N951 Menopausal and female climacteric states: Secondary | ICD-10-CM | POA: Diagnosis not present

## 2021-01-20 MED ORDER — NORETHINDRONE ACETATE 5 MG PO TABS
5.0000 mg | ORAL_TABLET | Freq: Every day | ORAL | 3 refills | Status: DC
  Filled 2021-01-20: qty 30, 30d supply, fill #0

## 2021-01-20 MED ORDER — LOSARTAN POTASSIUM-HCTZ 50-12.5 MG PO TABS
1.0000 | ORAL_TABLET | Freq: Every day | ORAL | 3 refills | Status: DC
  Filled 2021-01-20: qty 90, 90d supply, fill #0
  Filled 2021-04-16: qty 90, 90d supply, fill #1
  Filled 2021-07-21: qty 90, 90d supply, fill #2
  Filled 2021-10-14: qty 90, 90d supply, fill #3

## 2021-01-20 NOTE — Progress Notes (Signed)
Subjective:     Patient ID: [name], female   DOB: [birth year], 50 y.o.   MRN: [id number]  HPI [name] is a 50 year old black female,married, G4P2 in for complaints of bleeding heavy for 9 days and anemia, has had 2 rounds of iron infusions.She wears overnight pads and tampon, and changes every 3 hoours, no pain. PCP is Dr Moshe Cipro. Lab Results  Component Value Date   DIAGPAP  04/01/2020    - Negative for intraepithelial lesion or malignancy (NILM)   HPV NOT DETECTED 02/10/2017   HPVHIGH Negative 04/01/2020     Review of Systems +heavy periods, no pain.   Denies any hot flashes Has skipped a period occasionally. Reviewed past medical,surgical, social and family history. Reviewed medications and allergies.  Objective:   Physical Exam BP 115/74 (BP Location: Left Arm, Patient Position: Sitting, Cuff Size: Normal)   Pulse 90   Ht '5\' 11"'$  (1.803 m)   Wt 193 lb 8 oz (87.8 kg)   LMP 01/07/2021   BMI 26.99 kg/m     Skin warm and dry.Pelvic: external genitalia is normal in appearance no lesions, vagina: white discharge with out odor,urethra has no lesions or masses noted, cervix:smooth and bulbous, uterus: normal size, shape and contour, non tender, no masses felt, adnexa: no masses or tenderness noted. Bladder is non tender and no masses felt. Examination chaperoned by Levy Pupa LPN AA is 0 Fall risk is low Depression screen [facility] 2/9 01/20/2021 10/09/2020 08/22/2020  Decreased Interest 0 0 1  Down, Depressed, Hopeless 0 1 1  PHQ - 2 Score 0 1 2  Altered sleeping 0 - 0  Tired, decreased energy 2 - 2  Change in appetite 0 - 3  Feeling bad or failure about yourself  0 - 1  Trouble concentrating 0 - 0  Moving slowly or fidgety/restless 0 - 0  Suicidal thoughts 0 - 0  PHQ-9 Score 2 - 8  Difficult doing work/chores - - Not difficult at all  Some encounter information is confidential and restricted. Go to Review Flowsheets activity to see all data.  Some recent data might be  hidden    GAD 7 : Generalized Anxiety Score 01/20/2021 04/04/2020  Nervous, Anxious, on Edge 0 0  Control/stop worrying 0 0  Worry too much - different things 0 0  Trouble relaxing 0 0  Restless 0 0  Easily annoyed or irritable 0 1  Afraid - awful might happen 0 0  Total GAD 7 Score 0 1      Upstream - 01/20/21 0907       Pregnancy Intention Screening   Does the patient want to become pregnant in the next year? No    Does the patient's partner want to become pregnant in the next year? No    Would the patient like to discuss contraceptive options today? No      Contraception Wrap Up   Current Method Female Sterilization    End Method Female Sterilization    Contraception Counseling Provided No             Assessment:     1. Menorrhagia with irregular cycle Will try aygestin to stop the period Meds ordered this encounter  Medications   norethindrone (AYGESTIN) 5 MG tablet    Sig: Take 1 tablet (5 mg total) by mouth daily.    Dispense:  30 tablet    Refill:  3    Order Specific Question:   Supervising Provider  Answer:   Tania Ade H [[id number]]   Discussed, IUD and ablation as options, handout on ablation given  2. Peri-menopause     Plan:     Follow up in 4 weeks

## 2021-01-29 ENCOUNTER — Other Ambulatory Visit: Payer: Self-pay

## 2021-01-29 ENCOUNTER — Ambulatory Visit (INDEPENDENT_AMBULATORY_CARE_PROVIDER_SITE_OTHER): Payer: No Typology Code available for payment source | Admitting: Bariatrics

## 2021-01-29 ENCOUNTER — Other Ambulatory Visit (HOSPITAL_COMMUNITY): Payer: Self-pay

## 2021-01-29 ENCOUNTER — Encounter (INDEPENDENT_AMBULATORY_CARE_PROVIDER_SITE_OTHER): Payer: Self-pay | Admitting: Bariatrics

## 2021-01-29 VITALS — BP 112/74 | HR 71 | Temp 97.7°F | Ht 71.0 in | Wt 189.0 lb

## 2021-01-29 DIAGNOSIS — R7303 Prediabetes: Secondary | ICD-10-CM

## 2021-01-29 DIAGNOSIS — I10 Essential (primary) hypertension: Secondary | ICD-10-CM | POA: Diagnosis not present

## 2021-01-29 DIAGNOSIS — E669 Obesity, unspecified: Secondary | ICD-10-CM

## 2021-01-29 DIAGNOSIS — Z6830 Body mass index (BMI) 30.0-30.9, adult: Secondary | ICD-10-CM

## 2021-01-29 DIAGNOSIS — Z9189 Other specified personal risk factors, not elsewhere classified: Secondary | ICD-10-CM

## 2021-01-29 MED ORDER — SEMAGLUTIDE (1 MG/DOSE) 4 MG/3ML ~~LOC~~ SOPN
1.0000 mg | PEN_INJECTOR | SUBCUTANEOUS | 0 refills | Status: DC
  Filled 2021-01-29 – 2021-02-14 (×2): qty 3, 28d supply, fill #0

## 2021-01-29 NOTE — Progress Notes (Signed)
Chief Complaint:   OBESITY [name] is here to discuss her progress with her obesity treatment plan along with follow-up of her obesity related diagnoses. [name] is on the Category 3 Plan and states she is following her eating plan approximately 97% of the time. [name] states she is walking for 30 minutes 3 times per week.  Today's visit was #: 8 Starting weight: 205 lbs Starting date: 08/22/2020 Today's weight: 189 lbs Today's date: 01/29/2021 Total lbs lost to date: 16 Total lbs lost since last in-office visit: 2  Interim History: [name]'s weight is down another 2 lbs and she is doing well overall.  Subjective:   1. Pre-diabetes [name] denies side effects of Semaglutide.  2. Essential hypertension [name]'s blood pressure is controlled.   3. At risk of diabetes mellitus [name] is at higher than average risk for developing diabetes due to obesity.   Assessment/Plan:   1. Pre-diabetes [name] will continue to work on weight loss, exercise, and decreasing simple carbohydrates to help decrease the risk of diabetes. We will refill Semaglutide 1 mg for 1 month.  - Semaglutide, 1 MG/DOSE, 4 MG/3ML SOPN; Inject 1 mg into the skin once a week.  Dispense: 3 mL; Refill: 0  2. Essential hypertension [name] will continue her medications, and will continue working on healthy weight loss and exercise to improve blood pressure control. We will watch for signs of hypotension as she continues her lifestyle modifications.  3. At risk of diabetes mellitus [name] was given approximately 15 minutes of diabetes education and counseling today. We discussed intensive lifestyle modifications today with an emphasis on weight loss as well as increasing exercise and decreasing simple carbohydrates in her diet. We also reviewed medication options with an emphasis on risk versus benefit of those discussed.   Repetitive spaced learning was employed today to elicit superior memory formation and behavioral  change.  4. Obesity, current BMI 26.5 [name] is currently in the action stage of change. As such, her goal is to continue with weight loss efforts. She has agreed to the Category 3 Plan.   Intentional eating was discussed.  Exercise goals: As is.  Behavioral modification strategies: increasing lean protein intake, decreasing simple carbohydrates, increasing vegetables, increasing water intake, decreasing eating out, no skipping meals, meal planning and cooking strategies, keeping healthy foods in the home, and planning for success.  [name] has agreed to follow-up with our clinic in 3 weeks. She was informed of the importance of frequent follow-up visits to maximize her success with intensive lifestyle modifications for her multiple health conditions.   Objective:   Blood pressure 112/74, pulse 71, temperature 97.7 F (36.5 C), height '5\' 11"'$  (1.803 m), weight 189 lb (85.7 kg), last menstrual period 01/07/2021, SpO2 100 %. Body mass index is 26.36 kg/m.  General: Cooperative, alert, well developed, in no acute distress. HEENT: Conjunctivae and lids unremarkable. Cardiovascular: Regular rhythm.  Lungs: Normal work of breathing. Neurologic: No focal deficits.   Lab Results  Component Value Date   CREATININE 0.78 08/22/2020   BUN 12 08/22/2020   NA 136 08/22/2020   K 4.0 08/22/2020   CL 99 08/22/2020   CO2 20 08/22/2020   Lab Results  Component Value Date   ALT 7 08/22/2020   AST 14 08/22/2020   ALKPHOS 61 08/22/2020   BILITOT <0.2 08/22/2020   Lab Results  Component Value Date   HGBA1C 5.8 (H) 08/22/2020   HGBA1C 5.4 03/15/2019   HGBA1C 5.4 08/23/2017   HGBA1C 5.3  08/22/2016   HGBA1C 5.5 04/20/2016   Lab Results  Component Value Date   INSULIN 7.8 08/22/2020   Lab Results  Component Value Date   TSH 1.250 08/22/2020   Lab Results  Component Value Date   CHOL 174 08/01/2020   HDL 69 08/01/2020   LDLCALC 91 08/01/2020   TRIG 75 08/01/2020   CHOLHDL 2.5  08/01/2020   Lab Results  Component Value Date   VD25OH 38.5 08/22/2020   VD25OH 30.4 12/07/2019   VD25OH 26 (L) 03/15/2019   Lab Results  Component Value Date   WBC 8.3 12/24/2020   HGB 12.2 12/24/2020   HCT 36.9 12/24/2020   MCV 83.1 12/24/2020   PLT 224 12/24/2020   Lab Results  Component Value Date   IRON 34 12/24/2020   TIBC 340 12/24/2020   FERRITIN 50 12/24/2020   Attestation Statements:   Reviewed by clinician on day of visit: allergies, medications, problem list, medical history, surgical history, family history, social history, and previous encounter notes.   Wilhemena Durie, am acting as Location manager for [employer], DO.  I have reviewed the above documentation for accuracy and completeness, and I agree with the above. Jearld Lesch, DO

## 2021-02-08 ENCOUNTER — Other Ambulatory Visit: Payer: Self-pay

## 2021-02-08 ENCOUNTER — Ambulatory Visit
Admission: RE | Admit: 2021-02-08 | Discharge: 2021-02-08 | Disposition: A | Discharge status: Home / Self Care | Payer: No Typology Code available for payment source | Source: Ambulatory Visit | Attending: Internal Medicine | Admitting: Internal Medicine

## 2021-02-08 VITALS — BP 103/68 | HR 77 | Temp 98.1°F | Resp 16

## 2021-02-08 DIAGNOSIS — S43401A Unspecified sprain of right shoulder joint, initial encounter: Secondary | ICD-10-CM | POA: Diagnosis not present

## 2021-02-08 MED ORDER — PREDNISONE 20 MG PO TABS: 20.0000 mg | ORAL_TABLET | Freq: Every day | ORAL | 0 refills | Status: DC

## 2021-02-08 NOTE — ED Triage Notes (Signed)
Right shoulder pain x 1 week.  States she feel something pull while trying to get out of a car.  States it hurts to reach behind her and to put on her clothes.

## 2021-02-08 NOTE — Discharge Instructions (Addendum)
Please take medications as prescribed Stop taking diclofenac whilst on steroids. If your shoulder pain does not improve, you will need orthopedic surgery follow up.

## 2021-02-08 NOTE — ED Provider Notes (Signed)
[facility]    CSN: [id number] Arrival date & time: 02/08/21  [id number]      History   Chief Complaint No chief complaint on file.   HPI [name] is a 50 y.o. female comes to the urgent care with right shoulder pain of 1 week duration.  Patient pulled herself out of her daughter's car and started experiencing right shoulder pain.  Patient describes the pain as sharp, aggravated by movement of the arm.  No known relieving factors.  The pain is better when she is not using her arm.  No numbness or tingling.  Pain does not radiate into the arm.  No rashes or bruises noted.  Patient has a history of osteoarthritis.  She is currently using diclofenac, heating pad with no improvement in her symptoms. HPI  Past Medical History:  Diagnosis Date   Arthritis    Phreesia 03/29/2020   Back pain    DDD (degenerative disc disease), lumbar    Diabetes mellitus without complication ([place])    Phreesia 03/29/2020   Essential hypertension, benign    GERD (gastroesophageal reflux disease)    Hypertension    Phreesia 03/29/2020   Joint pain    Lactose intolerance    Lumbar herniated disc    Migraine headache 2007   Obesity    Since childhood    Osteoarthritis    Pneumonia 2007   Sacroiliac joint disease    Spinal disease    Supraventricular tachycardia [facility])    Status post RFA July 2015 - Dr. Lovena Le   Type 2 diabetes mellitus ([place])    Varicose vein of leg    Vitamin D deficiency    Weight loss    Hopitalized 4 years ago after loosing 200lb on a supervised program, malnurished with severe protein deficiency     Patient Active Problem List   Diagnosis Date Noted   Peri-menopause 01/20/2021   Menorrhagia with irregular cycle 01/20/2021   Pre-diabetes 11/19/2020   Absolute anemia 11/19/2020   Iron deficiency anemia due to chronic blood loss 10/10/2020   Abnormal CT scan, pelvis 04/01/2020   Need for Tdap vaccination 12/06/2019   Symptomatic varicose veins, bilateral  09/01/2018   Dyspepsia 01/18/2017   Back pain with sciatica 09/20/2014   Solitary lung nodule 09/05/2014   Hyperlipidemia LDL goal <100 01/26/2014   Allergic rhinitis 09/08/2010   OTH ABNORMAL BRAIN & CNS FUNCTION STUDY 02/16/2010   GOITER, UNSPECIFIED 02/10/2010   Class 1 obesity with serious comorbidity and body mass index (BMI) of 30.0 to 30.9 in adult 02/10/2010   Essential hypertension 05/02/2009    Past Surgical History:  Procedure Laterality Date   ABLATION  12-28-2013   slow pathway modification of AVNRT by Dr Lovena Le   COLONOSCOPY N/A 11/17/2019   Procedure: COLONOSCOPY;  Surgeon: Daneil Dolin, MD;  Location: [facility];  Service: Endoscopy;  Laterality: N/A;  7:30   ELECTROPHYSIOLOGY STUDY N/A 12/28/2013   Procedure: ELECTROPHYSIOLOGY STUDY;  Surgeon: Evans Lance, MD;  Location: [facility] CATH LAB;  Service: Cardiovascular;  Laterality: N/A;   SUPRAVENTRICULAR TACHYCARDIA ABLATION N/A 12/28/2013   Procedure: SUPRAVENTRICULAR TACHYCARDIA ABLATION;  Surgeon: Evans Lance, MD;  Location: [facility] CATH LAB;  Service: Cardiovascular;  Laterality: N/A;   TUBAL LIGATION  1996    OB History     Gravida  4   Para  2   Term      Preterm      AB      Living  SAB      IAB      Ectopic      Multiple      Live Births               Home Medications    Prior to Admission medications   Medication Sig Start Date End Date Taking? Authorizing Provider  predniSONE (DELTASONE) 20 MG tablet Take 1 tablet (20 mg total) by mouth daily for 5 days. 02/08/21 02/13/21 Yes Berna Gitto, Myrene Galas, MD  acetaminophen (TYLENOL) 500 MG tablet Take 1,000 mg by mouth every 6 (six) hours as needed (for pain).    [provider]  Cholecalciferol 25 MCG (1000 UT) tablet Take 1,000 Units by mouth daily.     [provider]  cyclobenzaprine (FLEXERIL) 10 MG tablet TAKE 1 TABLET BY MOUTH AT BEDTIME 11/27/20 11/27/21  Fayrene Helper, MD  Diclofenac-miSOPROStol 50-0.2 MG TBEC  TAKE 1 TABLET BY MOUTH TWICE DAILY AFTER A MEAL 11/27/20 11/27/21  Jessy Oto, MD  famotidine (PEPCID) 40 MG tablet TAKE ONE TABLET ONCE DAILY WHEN YOU TAKE IBUPROFEN 11/27/20 11/27/21  Fayrene Helper, MD  ferrous sulfate 325 (65 FE) MG tablet Take 325 mg by mouth every other day.    [provider]  gabapentin (NEURONTIN) 100 MG capsule TAKE ONE TO TWO CAPSULES BY  MOUTH AT BEDTIME FOR BACK PAIN 12/11/19   Fayrene Helper, MD  losartan-hydrochlorothiazide [facility]) 50-12.5 MG tablet TAKE 1 TABLET BY MOUTH ONCE DAILY 01/20/21 01/20/22  Fayrene Helper, MD  norethindrone (AYGESTIN) 5 MG tablet Take 1 tablet (5 mg total) by mouth daily. 01/20/21   Estill Dooms, NP  Semaglutide, 1 MG/DOSE, 4 MG/3ML SOPN Inject 1 mg into the skin once a week. 01/29/21   Georgia Lopes, DO    Family History Family History  Problem Relation Age of Onset   Hypertension Mother    Diabetes Mother    Cancer Mother        Uterine    Heart failure Mother        CABG   Hyperlipidemia Mother    Heart disease Mother    Thyroid disease Mother    Cancer Father 66       Prostate    Hypertension Father    Bradycardia Father        Irregular heart beat    Hyperlipidemia Father    Kidney disease Father    Diabetes Brother    Diabetes Brother    Hypertension Brother    Hypertension Brother     Social History Social History   Tobacco Use   Smoking status: Former    Packs/day: 0.50    Years: 7.00    Pack years: 3.50    Types: Cigarettes    Quit date: 08/05/2012    Years since quitting: 8.5   Smokeless tobacco: Never   Tobacco comments:    quit in 2014  Vaping Use   Vaping Use: Never used  Substance Use Topics   Alcohol use: No    Alcohol/week: 0.0 standard drinks   Drug use: No     Allergies   Other, Codeine, and Metoclopramide hcl   Review of Systems Review of Systems  Gastrointestinal: Negative.   Musculoskeletal:  Positive for arthralgias. Negative for joint swelling and  myalgias.  Skin: Negative.     Physical Exam Triage Vital Signs ED Triage Vitals  Enc Vitals Group     BP 02/08/21 0916 103/68  Pulse Rate 02/08/21 0916 77     Resp 02/08/21 0916 16     Temp 02/08/21 0916 98.1 F (36.7 C)     Temp Source 02/08/21 0916 Oral     SpO2 02/08/21 0916 96 %     Weight --      Height --      Head Circumference --      Peak Flow --      Pain Score 02/08/21 0917 5     Pain Loc --      Pain Edu? --      Excl. in [place]? --    No data found.  Updated Vital Signs BP 103/68 (BP Location: Right Arm)   Pulse 77   Temp 98.1 F (36.7 C) (Oral)   Resp 16   LMP 01/06/2021 (Exact Date)   SpO2 96%   Visual Acuity Right Eye Distance:   Left Eye Distance:   Bilateral Distance:    Right Eye Near:   Left Eye Near:    Bilateral Near:     Physical Exam Vitals and nursing note reviewed.  Constitutional:      General: She is not in acute distress.    Appearance: She is not ill-appearing.  Cardiovascular:     Rate and Rhythm: Normal rate and regular rhythm.  Pulmonary:     Effort: Pulmonary effort is normal.     Breath sounds: Normal breath sounds.  Musculoskeletal:        General: Tenderness present. No deformity or signs of injury. Normal range of motion.  Neurological:     Mental Status: She is alert.     UC Treatments / Results  Labs (all labs ordered are listed, but only abnormal results are displayed) Labs Reviewed - No data to display  EKG   Radiology No results found.  Procedures Procedures (including critical care time)  Medications Ordered in UC Medications - No data to display  Initial Impression / Assessment and Plan / UC Course  I have reviewed the triage vital signs and the nursing notes.  Pertinent labs & imaging results that were available during my care of the patient were reviewed by me and considered in my medical decision making (see chart for details).     1.  Right shoulder sprain: Short course of  steroids Gentle range of motion exercises Continue heating pad use No indication for x-rays at this time Follow-up with urgent care if symptoms worsen. If right shoulder pain is persistent you may need orthopedic surgery follow-up. Final Clinical Impressions(s) / UC Diagnoses   Final diagnoses:  Sprain of right shoulder, unspecified shoulder sprain type, initial encounter     Discharge Instructions      Please take medications as prescribed Stop taking diclofenac whilst on steroids. If your shoulder pain does not improve, you will need orthopedic surgery follow up.   ED Prescriptions     Medication Sig Dispense Auth. Provider   predniSONE (DELTASONE) 20 MG tablet Take 1 tablet (20 mg total) by mouth daily for 5 days. 5 tablet Asuka Dusseau, Myrene Galas, MD      PDMP not reviewed this encounter.   Chase Picket, MD 02/08/21 1022

## 2021-02-10 ENCOUNTER — Encounter: Payer: Self-pay | Admitting: Family Medicine

## 2021-02-12 ENCOUNTER — Ambulatory Visit (INDEPENDENT_AMBULATORY_CARE_PROVIDER_SITE_OTHER): Payer: No Typology Code available for payment source | Admitting: Family Medicine

## 2021-02-12 ENCOUNTER — Encounter: Payer: Self-pay | Admitting: Family Medicine

## 2021-02-12 ENCOUNTER — Other Ambulatory Visit: Payer: Self-pay

## 2021-02-12 VITALS — BP 112/63 | HR 88 | Resp 15 | Ht 71.0 in | Wt 194.1 lb

## 2021-02-12 DIAGNOSIS — E6609 Other obesity due to excess calories: Secondary | ICD-10-CM | POA: Diagnosis not present

## 2021-02-12 DIAGNOSIS — M25511 Pain in right shoulder: Secondary | ICD-10-CM | POA: Diagnosis not present

## 2021-02-12 DIAGNOSIS — Z6830 Body mass index (BMI) 30.0-30.9, adult: Secondary | ICD-10-CM | POA: Diagnosis not present

## 2021-02-12 DIAGNOSIS — R7303 Prediabetes: Secondary | ICD-10-CM

## 2021-02-12 NOTE — Assessment & Plan Note (Addendum)
pain at 4 but with certain movement up to a 9, ortho referral,  Completing steroid course and has anti inflam

## 2021-02-12 NOTE — Patient Instructions (Signed)
F/U as before , call if you need me sooner  You are referred to orthopedics soonest available appt, here in [place] on weight loss continue new good healthy habits  It is important that you exercise regularly at least 30 minutes 5 times a week. If you develop chest pain, have severe difficulty breathing, or feel very tired, stop exercising immediately and seek medical attention   Thanks for choosing [facility], we consider it a privelige to serve you.

## 2021-02-13 ENCOUNTER — Encounter: Payer: Self-pay | Admitting: Orthopedic Surgery

## 2021-02-13 ENCOUNTER — Ambulatory Visit: Payer: No Typology Code available for payment source

## 2021-02-13 ENCOUNTER — Other Ambulatory Visit: Payer: Self-pay

## 2021-02-13 ENCOUNTER — Ambulatory Visit (INDEPENDENT_AMBULATORY_CARE_PROVIDER_SITE_OTHER): Payer: No Typology Code available for payment source | Admitting: Orthopedic Surgery

## 2021-02-13 ENCOUNTER — Encounter: Payer: Self-pay | Admitting: Family Medicine

## 2021-02-13 ENCOUNTER — Other Ambulatory Visit (HOSPITAL_COMMUNITY): Payer: Self-pay

## 2021-02-13 VITALS — BP 118/75 | HR 78 | Ht 71.0 in | Wt 197.0 lb

## 2021-02-13 DIAGNOSIS — G8929 Other chronic pain: Secondary | ICD-10-CM

## 2021-02-13 DIAGNOSIS — M25511 Pain in right shoulder: Secondary | ICD-10-CM | POA: Diagnosis not present

## 2021-02-13 MED ORDER — MELOXICAM 7.5 MG PO TABS
7.5000 mg | ORAL_TABLET | Freq: Every day | ORAL | 5 refills | Status: DC
  Filled 2021-02-13: qty 30, 30d supply, fill #0

## 2021-02-13 NOTE — Assessment & Plan Note (Signed)
Patient educated about the importance of limiting  Carbohydrate intake , the need to commit to daily physical activity for a minimum of 30 minutes , and to commit weight loss. The fact that changes in all these areas will reduce or eliminate all together the development of diabetes is stressed.   Diabetic Labs Latest Ref Rng & Units 08/22/2020 08/01/2020 12/07/2019 03/15/2019 08/03/2018  HbA1c 4.8 - 5.6 % 5.8(H) - - 5.4 -  Microalbumin mg/dL - - - - -  Micro/Creat Ratio <30 mcg/mg creat - - - - -  Chol 100 - 199 mg/dL - 174 - 203(H) -  HDL >39 mg/dL - 69 - 81 -  Calc LDL 0 - 99 mg/dL - 91 - 105(H) -  Triglycerides 0 - 149 mg/dL - 75 - 76 -  Creatinine 0.57 - 1.00 mg/dL 0.78 0.93 0.80 0.80 0.88  GFR >60.00 mL/min - - - - -   BP/Weight 02/13/2021 02/12/2021 02/08/2021 01/29/2021 01/20/2021 01/13/2021 [id number]  Systolic BP 123456 [id number] [id number]  Diastolic BP 75 63 68 74 74 63 74  Wt. (Lbs) 197 194.12 - 189 193.5 - 191  BMI 27.48 27.07 - 26.36 26.99 - 26.64  Some encounter information is confidential and restricted. Go to Review Flowsheets activity to see all data.   Foot/eye exam completion dates Latest Ref Rng & Units 10/10/2019 08/25/2017  Eye Exam No Retinopathy No Retinopathy -  Foot Form Completion - - Done  Some encounter information is confidential and restricted. Go to Review Flowsheets activity to see all data.

## 2021-02-13 NOTE — Assessment & Plan Note (Signed)
Improved. Pt applauded on succesful weight loss through lifestyle change, and encouraged to continue same. Weight loss goal set for the next several months. Managed through weight loss clinic

## 2021-02-13 NOTE — Progress Notes (Signed)
Chief Complaint  Patient presents with   Shoulder Pain    Right shoulder//hurts to sleep on it and put shirts on it//injured it getting out of a low sitting car    History 50 year old female was pulling herself up from a seated position and injured her right shoulder  She reports an injury date of August 13.  She is having difficulty getting dressed and undressed and laying on her right side she tried ice heat 5 days of prednisone after visiting the urgent care  Pain is from her right shoulder to her right elbow  BP 118/75   Pulse 78   Ht '5\' 11"'$  (1.803 m)   Wt 197 lb (89.4 kg)   BMI 27.48 kg/m   Exam of the right shoulder shows no specific areas of tenderness she has normal internal and external rotation motion and strength however she has pain at abduction 90 and at forward elevation 120 she has a positive impingement sign without instability   X-rays are negative for any fracture   procedure note  Injection  Verbal consent was obtained to inject the subacromial space right shoulder  Timeout procedure was completed to confirm injection site  Diagnosis shoulder impingement syndrome right shoulder  Medications used Celestone Lidocaine 1% plain 3 cc  Anesthesia was provided by ethyl chloride spray  Prep was performed with alcohol  Technique of injection posterior approach subacromial space injected  No complications were noted  Meds ordered this encounter  Medications   meloxicam (MOBIC) 7.5 MG tablet    Sig: Take 1 tablet (7.5 mg total) by mouth daily.    Dispense:  30 tablet    Refill:  5   Home exercise program come back in 4 to 6 weeks

## 2021-02-13 NOTE — Progress Notes (Signed)
[name]     MRN: [id number]      DOB: 1971-02-13   HPI [name] is here c/o right shoulder pain since 8/14 when she hurt the arm trying to pull herself up and out of a low car. Has been  on anti inflammatories from UC, still has pai rated at an 8 with certain movement so movement is limited At weight loss clinic with success since March   ROS Denies recent fever or chills. Denies sinus pressure, nasal congestion, ear pain or sore throat. Denies chest congestion, productive cough or wheezing. Denies chest pains, palpitations and leg swelling Denies abdominal pain, nausea, vomiting,diarrhea or constipation.   Denies dysuria, frequency, hesitancy or incontinence.  Denies headaches, seizures, numbness, or tingling. Denies depression, anxiety or insomnia. Denies skin break down or rash.   PE  BP 112/63   Pulse 88   Resp 15   Ht '5\' 11"'$  (1.803 m)   Wt 194 lb 1.9 oz (88.1 kg)   SpO2 98%   BMI 27.07 kg/m   Patient alert and oriented and in no cardiopulmonary distress.  HEENT: No facial asymmetry, EOMI,     Neck supple .  Chest: Clear to auscultation bilaterally.  CVS: S1, S2 no murmurs, no S3.Regular rate.  ABD: Soft non tender.   Ext: No edema  MS: Adequate ROM spine, , hips and knees.Decreased ROM right shoulder   Skin: Intact, no ulcerations or rash noted.  Psych: Good eye contact, normal affect. Memory intact not anxious or depressed appearing.  CNS: CN 2-12 intact, power,  normal throughout.no focal deficits noted.   Assessment & Plan  Shoulder pain, right  pain at 4 but with certain movement up to a 9, ortho referral,  Completing steroid course and has anti inflam  Pre-diabetes Patient educated about the importance of limiting  Carbohydrate intake , the need to commit to daily physical activity for a minimum of 30 minutes , and to commit weight loss. The fact that changes in all these areas will reduce or eliminate all together the  development of diabetes is stressed.   Diabetic Labs Latest Ref Rng & Units 08/22/2020 08/01/2020 12/07/2019 03/15/2019 08/03/2018  HbA1c 4.8 - 5.6 % 5.8(H) - - 5.4 -  Microalbumin mg/dL - - - - -  Micro/Creat Ratio <30 mcg/mg creat - - - - -  Chol 100 - 199 mg/dL - 174 - 203(H) -  HDL >39 mg/dL - 69 - 81 -  Calc LDL 0 - 99 mg/dL - 91 - 105(H) -  Triglycerides 0 - 149 mg/dL - 75 - 76 -  Creatinine 0.57 - 1.00 mg/dL 0.78 0.93 0.80 0.80 0.88  GFR >60.00 mL/min - - - - -   BP/Weight 02/13/2021 02/12/2021 02/08/2021 01/29/2021 01/20/2021 01/13/2021 [id number]  Systolic BP 123456 [id number] [id number]  Diastolic BP 75 63 68 74 74 63 74  Wt. (Lbs) 197 194.12 - 189 193.5 - 191  BMI 27.48 27.07 - 26.36 26.99 - 26.64  Some encounter information is confidential and restricted. Go to Review Flowsheets activity to see all data.   Foot/eye exam completion dates Latest Ref Rng & Units 10/10/2019 08/25/2017  Eye Exam No Retinopathy No Retinopathy -  Foot Form Completion - - Done  Some encounter information is confidential and restricted. Go to Review Flowsheets activity to see all data.      Class 1 obesity with serious comorbidity and body mass index (BMI) of 30.0 to 30.9 in  adult Improved. Pt applauded on succesful weight loss through lifestyle change, and encouraged to continue same. Weight loss goal set for the next several months. Managed through weight loss clinic

## 2021-02-13 NOTE — Patient Instructions (Signed)
You have received an injection of steroids into the joint. 15% of patients will have increased pain within the 24 hours postinjection.   This is transient and will go away.   We recommend that you use ice packs on the injection site for 20 minutes every 2 hours and extra strength Tylenol 2 tablets every 8 as needed until the pain resolves.  If you continue to have pain after taking the Tylenol and using the ice please call the office for further instructions.  Start oral nsaids   Exercises as instructed

## 2021-02-14 ENCOUNTER — Other Ambulatory Visit (HOSPITAL_COMMUNITY): Payer: Self-pay

## 2021-02-17 ENCOUNTER — Other Ambulatory Visit: Payer: Self-pay

## 2021-02-17 ENCOUNTER — Encounter: Payer: Self-pay | Admitting: Adult Health

## 2021-02-17 ENCOUNTER — Ambulatory Visit (INDEPENDENT_AMBULATORY_CARE_PROVIDER_SITE_OTHER): Payer: No Typology Code available for payment source | Admitting: Adult Health

## 2021-02-17 ENCOUNTER — Other Ambulatory Visit (HOSPITAL_COMMUNITY): Payer: Self-pay

## 2021-02-17 VITALS — BP 102/68 | HR 82 | Ht 71.0 in | Wt 192.0 lb

## 2021-02-17 DIAGNOSIS — N951 Menopausal and female climacteric states: Secondary | ICD-10-CM | POA: Diagnosis not present

## 2021-02-17 DIAGNOSIS — N921 Excessive and frequent menstruation with irregular cycle: Secondary | ICD-10-CM

## 2021-02-17 MED ORDER — MEGESTROL ACETATE 40 MG PO TABS
ORAL_TABLET | ORAL | 1 refills | Status: DC
  Filled 2021-02-17: qty 45, 30d supply, fill #0

## 2021-02-17 NOTE — Progress Notes (Signed)
  Subjective:     Patient ID: [name], female   DOB: September 20, 1970, 50 y.o.   MRN: [id number]  HPI [name] is a 50 year old black female,married, G4P2,back in follow up on bleeding, is taking aygestin, and it helped for a while, has bleed since 01/30/21. PCP is Dr Moshe Cipro.  Review of Systems +bleeding Reviewed past medical,surgical, social and family history. Reviewed medications and allergies.     Objective:   Physical Exam BP 102/68 (BP Location: Left Arm, Patient Position: Sitting, Cuff Size: Normal)   Pulse 82   Ht '5\' 11"'$  (1.803 m)   Wt 192 lb (87.1 kg)   LMP 02/06/2021   BMI 26.78 kg/m  Skin warm and dry. Lungs: clear to ausculation bilaterally. Cardiovascular: regular rate and rhythm.    Fall risk is low  Upstream - 02/17/21 0904       Pregnancy Intention Screening   Does the patient want to become pregnant in the next year? N/A    Does the patient's partner want to become pregnant in the next year? N/A    Would the patient like to discuss contraceptive options today? N/A      Contraception Wrap Up   Current Method Female Sterilization    End Method Female Sterilization    Contraception Counseling Provided No             Assessment:     1. Menorrhagia with irregular cycle Will stop aygestin and try megace Meds ordered this encounter  Medications   megestrol (MEGACE) 40 MG tablet    Sig: Take 3 x 5 days then 2 x 5 days then 1 daily    Dispense:  45 tablet    Refill:  1    Order Specific Question:   Supervising Provider    Answer:   Florian Buff [[id number]]   Will get GYN [place] 02/25/21 to assess uterus  2. Peri-menopause     Plan:     Will talk when [place] resulted

## 2021-02-19 ENCOUNTER — Other Ambulatory Visit: Payer: Self-pay

## 2021-02-19 ENCOUNTER — Ambulatory Visit (INDEPENDENT_AMBULATORY_CARE_PROVIDER_SITE_OTHER): Payer: No Typology Code available for payment source | Admitting: Adult Health

## 2021-02-19 ENCOUNTER — Encounter (INDEPENDENT_AMBULATORY_CARE_PROVIDER_SITE_OTHER): Payer: Self-pay | Admitting: Adult Health

## 2021-02-19 VITALS — BP 117/60 | HR 74 | Temp 98.0°F | Ht 71.0 in | Wt 188.0 lb

## 2021-02-19 DIAGNOSIS — Z6830 Body mass index (BMI) 30.0-30.9, adult: Secondary | ICD-10-CM | POA: Diagnosis not present

## 2021-02-19 DIAGNOSIS — I10 Essential (primary) hypertension: Secondary | ICD-10-CM | POA: Diagnosis not present

## 2021-02-19 DIAGNOSIS — E6609 Other obesity due to excess calories: Secondary | ICD-10-CM | POA: Diagnosis not present

## 2021-02-19 DIAGNOSIS — R7303 Prediabetes: Secondary | ICD-10-CM

## 2021-02-19 NOTE — Progress Notes (Signed)
Chief Complaint:   OBESITY [name] is here to discuss her progress with her obesity treatment plan along with follow-up of her obesity related diagnoses. [name] is on the Category 3 Plan and states she is following her eating plan approximately 95-97% of the time. [name] states she is walking for 45 minutes 2-3 times per week.  Today's visit was #: 9 Starting weight: 205 lbs Starting date: 08/22/2020 Today's weight: 188 lbs Today's date: 02/19/2021 Total lbs lost to date: 17 lbs Total lbs lost since last in-office visit: 1 lb  Interim History:  [name] was previously on Wegovy - converted to Ozempic due to [employer] of [id number].  Currently doing well on Ozempic 1 mg. She denies mass in neck, dyspepsia, dysphagia, or persistent hoarseness. Ultimate goal is to lose down to 185.   Current weight 188 with current BMI 26.3.  Subjective:   1. Essential hypertension BP/HR at goal at office visit. She is on losartan/HCTZ 50/12.5 mg daily.  BP Readings from Last 3 Encounters:  02/19/21 117/60  02/17/21 102/68  02/13/21 118/75   2. Pre-diabetes [name] was previously on Wegovy - converted to Ozempic due to [employer] of Wegovy.  Currently doing well on Ozempic 1 mg. She denies mass in neck, dyspepsia, dysphagia, or persistent hoarseness.  Lab Results  Component Value Date   HGBA1C 5.8 (H) 08/22/2020   Lab Results  Component Value Date   INSULIN 7.8 08/22/2020   Assessment/Plan:   1. Essential hypertension Continue losartan/HCTZ 50/12.5 mg daily.  2. Pre-diabetes Continue Ozempic 1 mg.  No need for refill.  3. Class 1 obesity due to excess calories with serious comorbidity and body mass index (BMI) of 30.0 to 30.9 in adult, with current BMI 26.3  [name] is currently in the action stage of change. As such, her goal is to continue with weight loss efforts. She has agreed to the Category 3 Plan.   Exercise goals:  As is.  Increase walking to 4 times per  week.  Behavioral modification strategies: increasing lean protein intake, decreasing simple carbohydrates, meal planning and cooking strategies, keeping healthy foods in the home, and planning for success.  [name] has agreed to follow-up with our clinic in 4 weeks. She was informed of the importance of frequent follow-up visits to maximize her success with intensive lifestyle modifications for her multiple health conditions.   Objective:   Blood pressure 117/60, pulse 74, temperature 98 F (36.7 C), height '5\' 11"'$  (1.803 m), weight 188 lb (85.3 kg), last menstrual period 02/06/2021, SpO2 98 %. Body mass index is 26.22 kg/m.  General: Cooperative, alert, well developed, in no acute distress. HEENT: Conjunctivae and lids unremarkable. Cardiovascular: Regular rhythm.  Lungs: Normal work of breathing. Neurologic: No focal deficits.   Lab Results  Component Value Date   CREATININE 0.78 08/22/2020   BUN 12 08/22/2020   NA 136 08/22/2020   K 4.0 08/22/2020   CL 99 08/22/2020   CO2 20 08/22/2020   Lab Results  Component Value Date   ALT 7 08/22/2020   AST 14 08/22/2020   ALKPHOS 61 08/22/2020   BILITOT <0.2 08/22/2020   Lab Results  Component Value Date   HGBA1C 5.8 (H) 08/22/2020   HGBA1C 5.4 03/15/2019   HGBA1C 5.4 08/23/2017   HGBA1C 5.3 08/22/2016   HGBA1C 5.5 04/20/2016   Lab Results  Component Value Date   INSULIN 7.8 08/22/2020   Lab Results  Component Value Date   TSH 1.250 08/22/2020  Lab Results  Component Value Date   CHOL 174 08/01/2020   HDL 69 08/01/2020   LDLCALC 91 08/01/2020   TRIG 75 08/01/2020   CHOLHDL 2.5 08/01/2020   Lab Results  Component Value Date   VD25OH 38.5 08/22/2020   VD25OH 30.4 12/07/2019   VD25OH 26 (L) 03/15/2019   Lab Results  Component Value Date   WBC 8.3 12/24/2020   HGB 12.2 12/24/2020   HCT 36.9 12/24/2020   MCV 83.1 12/24/2020   PLT 224 12/24/2020   Lab Results  Component Value Date   IRON 34 12/24/2020    TIBC 340 12/24/2020   FERRITIN 50 12/24/2020   Attestation Statements:   Reviewed by clinician on day of visit: allergies, medications, problem list, medical history, surgical history, family history, social history, and previous encounter notes.  Time spent on visit including pre-visit chart review and post-visit care and charting was 28 minutes.   I, Water quality scientist, CMA, am acting as Location manager for Mina Marble, NP.  I have reviewed the above documentation for accuracy and completeness, and I agree with the above. -  Verdean Murin d. Jency Schnieders, NP-C

## 2021-02-25 ENCOUNTER — Other Ambulatory Visit: Payer: Self-pay | Admitting: Family Medicine

## 2021-02-25 ENCOUNTER — Other Ambulatory Visit: Payer: Self-pay

## 2021-02-25 ENCOUNTER — Other Ambulatory Visit (HOSPITAL_COMMUNITY): Payer: Self-pay

## 2021-02-25 ENCOUNTER — Ambulatory Visit (INDEPENDENT_AMBULATORY_CARE_PROVIDER_SITE_OTHER): Payer: No Typology Code available for payment source

## 2021-02-25 DIAGNOSIS — N921 Excessive and frequent menstruation with irregular cycle: Secondary | ICD-10-CM

## 2021-02-25 MED ORDER — FAMOTIDINE 40 MG PO TABS
ORAL_TABLET | ORAL | 0 refills | Status: DC
Start: 2021-02-25 — End: 2021-05-28
  Filled 2021-02-25: qty 90, 90d supply, fill #0

## 2021-02-25 NOTE — Progress Notes (Signed)
PELVIC US TA/TV: heterogeneous anteverted uterus,submucosal fibroid 4.2 x 3.3 x 3.4 cm,EEC 6.9 mm (limited view of EEC),normal ovaries,ovaries appear mobile,no free fluid

## 2021-02-26 ENCOUNTER — Other Ambulatory Visit (HOSPITAL_COMMUNITY): Payer: Self-pay

## 2021-03-03 ENCOUNTER — Encounter (INDEPENDENT_AMBULATORY_CARE_PROVIDER_SITE_OTHER): Payer: Self-pay | Admitting: Bariatrics

## 2021-03-06 ENCOUNTER — Encounter: Payer: No Typology Code available for payment source | Admitting: Family Medicine

## 2021-03-11 ENCOUNTER — Other Ambulatory Visit (HOSPITAL_COMMUNITY): Payer: Self-pay

## 2021-03-11 ENCOUNTER — Other Ambulatory Visit (INDEPENDENT_AMBULATORY_CARE_PROVIDER_SITE_OTHER): Payer: Self-pay | Admitting: Bariatrics

## 2021-03-11 ENCOUNTER — Other Ambulatory Visit: Payer: Self-pay | Admitting: Family Medicine

## 2021-03-11 DIAGNOSIS — R7303 Prediabetes: Secondary | ICD-10-CM

## 2021-03-11 DIAGNOSIS — M5430 Sciatica, unspecified side: Secondary | ICD-10-CM

## 2021-03-11 DIAGNOSIS — M549 Dorsalgia, unspecified: Secondary | ICD-10-CM

## 2021-03-11 MED ORDER — CYCLOBENZAPRINE HCL 10 MG PO TABS
ORAL_TABLET | Freq: Every day | ORAL | 0 refills | Status: DC
  Filled 2021-03-11: qty 90, 90d supply, fill #0

## 2021-03-17 ENCOUNTER — Ambulatory Visit (INDEPENDENT_AMBULATORY_CARE_PROVIDER_SITE_OTHER): Payer: No Typology Code available for payment source | Admitting: Orthopedic Surgery

## 2021-03-17 ENCOUNTER — Encounter: Payer: Self-pay | Admitting: Orthopedic Surgery

## 2021-03-17 ENCOUNTER — Other Ambulatory Visit: Payer: Self-pay

## 2021-03-17 VITALS — BP 124/73 | HR 74 | Ht 71.0 in | Wt 190.0 lb

## 2021-03-17 DIAGNOSIS — M7541 Impingement syndrome of right shoulder: Secondary | ICD-10-CM

## 2021-03-17 NOTE — Progress Notes (Signed)
Chief Complaint  Patient presents with   Shoulder Pain    F/u right shoulder pain, doing better overall, but still bothersome.  Con't HEP.  Night pain, no weakness.  Pain is from shld joint down to elbow right side.   50 year old female Presents for reevaluation of ongoing pain right shoulder status post injection and she is on Mobic  She says she is better but she still has pain at night and has some difficulty with some shirts that she puts on.  Her exam right shoulder shows she has full range of motion including 50 degrees external rotation she has 5 out of 5 strength but she has a positive impingement sign and tenderness in the right deltoid  We repeated her injection with Celestone in the subacromial space.  She gave permission we confirmed the site of injection as right shoulder we cleaned the skin with ethyl chloride we injected Celestone and lidocaine and it was tolerated well  She will continue home exercises and Mobic and see me in a month

## 2021-03-17 NOTE — Patient Instructions (Signed)
Continue home exercises  Continue Mobic  Return in 1 month

## 2021-03-20 ENCOUNTER — Other Ambulatory Visit: Payer: Self-pay

## 2021-03-20 ENCOUNTER — Other Ambulatory Visit (HOSPITAL_COMMUNITY): Payer: Self-pay

## 2021-03-20 ENCOUNTER — Encounter (INDEPENDENT_AMBULATORY_CARE_PROVIDER_SITE_OTHER): Payer: Self-pay | Admitting: Adult Health

## 2021-03-20 ENCOUNTER — Ambulatory Visit (INDEPENDENT_AMBULATORY_CARE_PROVIDER_SITE_OTHER): Payer: No Typology Code available for payment source | Admitting: Adult Health

## 2021-03-20 VITALS — BP 111/67 | HR 72 | Temp 98.2°F | Ht 71.0 in | Wt 184.0 lb

## 2021-03-20 DIAGNOSIS — Z9189 Other specified personal risk factors, not elsewhere classified: Secondary | ICD-10-CM

## 2021-03-20 DIAGNOSIS — Z6830 Body mass index (BMI) 30.0-30.9, adult: Secondary | ICD-10-CM

## 2021-03-20 DIAGNOSIS — R7303 Prediabetes: Secondary | ICD-10-CM

## 2021-03-20 DIAGNOSIS — E669 Obesity, unspecified: Secondary | ICD-10-CM

## 2021-03-20 DIAGNOSIS — I10 Essential (primary) hypertension: Secondary | ICD-10-CM

## 2021-03-20 MED ORDER — OZEMPIC (1 MG/DOSE) 4 MG/3ML ~~LOC~~ SOPN
PEN_INJECTOR | SUBCUTANEOUS | 0 refills | Status: DC
  Filled 2021-03-20: qty 3, 28d supply, fill #0

## 2021-03-20 MED ORDER — SEMAGLUTIDE (1 MG/DOSE) 4 MG/3ML ~~LOC~~ SOPN: 1.0000 mg | PEN_INJECTOR | SUBCUTANEOUS | 0 refills | Status: DC

## 2021-03-20 NOTE — Progress Notes (Signed)
Chief Complaint:   OBESITY [name] is here to discuss her progress with her obesity treatment plan along with follow-up of her obesity related diagnoses. [name] is on the Category 3 Plan and states she is following her eating plan approximately 97% of the time. [name] states she is using the treadmill for 45 minutes 3-4 times per week.  Today's visit was #: 10 Starting weight: 205 lbs Starting date: 08/22/2020 Today's weight: 184 lbs Today's date: 03/20/2021 Total lbs lost to date: 21 lbs Total lbs lost since last in-office visit: 4 lbs  Interim History: [name] reached her goal weight of <185 - currently 184 pounds.  BMI 25.7! Will discuss moving into maintenance phase at next office visit.  Subjective:   1. Essential hypertension Ambulatory reading:  SBP 120s, DBP 70s. She denies acute cardiac symptoms.  2. Pre-diabetes She is on Ozempic 1 mg once weekly. She denies mass in neck, dysphagia, dyspepsia, or persistent hoarseness.  3. At risk for nausea [name] is at risk for nausea due to taking a GLP-1 for her prediabetes.  Assessment/Plan:   1. Essential hypertension Check fasting labs at next office visit. [name] is working on healthy weight loss and exercise to improve blood pressure control. We will watch for signs of hypotension as she continues her lifestyle modifications.  2. Pre-diabetes Check fasting labs at next office visit. Refill Ozempic 1 mg subcutaneously weekly, as per below. [name] will continue to work on weight loss, exercise, and decreasing simple carbohydrates to help decrease the risk of diabetes.   - Refill Semaglutide, 1 MG/DOSE, 4 MG/3ML SOPN; Inject 1 mg into the skin once a week.  Dispense: 9 mL; Refill: 0  3. At risk for nausea [name] was given approximately 15 minutes of nausea prevention counseling today. [name] is at risk for nausea due to her new or current medication. She was encouraged to titrate her medication slowly, make  sure to stay hydrated, eat smaller portions throughout the day, and avoid high fat meals.   4. Obesity, current BMI 25.7  [name] is currently in the action stage of change. As such, her goal is to continue with weight loss efforts. She has agreed to the Category 3 Plan.   Check IC and fasting labs at next visit.  Exercise goals:  As is.  Behavioral modification strategies: increasing lean protein intake, decreasing simple carbohydrates, meal planning and cooking strategies, keeping healthy foods in the home, and planning for success.  [name] has agreed to follow-up with our clinic in 4 weeks, fasting for labs and IC. She was informed of the importance of frequent follow-up visits to maximize her success with intensive lifestyle modifications for her multiple health conditions.   Objective:   Blood pressure 111/67, pulse 72, temperature 98.2 F (36.8 C), height 5\' 11"  (1.803 m), weight 184 lb (83.5 kg), SpO2 100 %. Body mass index is 25.66 kg/m.  General: Cooperative, alert, well developed, in no acute distress. HEENT: Conjunctivae and lids unremarkable. Cardiovascular: Regular rhythm.  Lungs: Normal work of breathing. Neurologic: No focal deficits.   Lab Results  Component Value Date   CREATININE 0.78 08/22/2020   BUN 12 08/22/2020   NA 136 08/22/2020   K 4.0 08/22/2020   CL 99 08/22/2020   CO2 20 08/22/2020   Lab Results  Component Value Date   ALT 7 08/22/2020   AST 14 08/22/2020   ALKPHOS 61 08/22/2020   BILITOT <0.2 08/22/2020   Lab Results  Component Value  Date   HGBA1C 5.8 (H) 08/22/2020   HGBA1C 5.4 03/15/2019   HGBA1C 5.4 08/23/2017   HGBA1C 5.3 08/22/2016   HGBA1C 5.5 04/20/2016   Lab Results  Component Value Date   INSULIN 7.8 08/22/2020   Lab Results  Component Value Date   TSH 1.250 08/22/2020   Lab Results  Component Value Date   CHOL 174 08/01/2020   HDL 69 08/01/2020   LDLCALC 91 08/01/2020   TRIG 75 08/01/2020   CHOLHDL 2.5 08/01/2020    Lab Results  Component Value Date   VD25OH 38.5 08/22/2020   VD25OH 30.4 12/07/2019   VD25OH 26 (L) 03/15/2019   Lab Results  Component Value Date   WBC 8.3 12/24/2020   HGB 12.2 12/24/2020   HCT 36.9 12/24/2020   MCV 83.1 12/24/2020   PLT 224 12/24/2020   Lab Results  Component Value Date   IRON 34 12/24/2020   TIBC 340 12/24/2020   FERRITIN 50 12/24/2020   Attestation Statements:   Reviewed by clinician on day of visit: allergies, medications, problem list, medical history, surgical history, family history, social history, and previous encounter notes.  I, Water quality scientist, CMA, am acting as Location manager for Mina Marble, NP.  I have reviewed the above documentation for accuracy and completeness, and I agree with the above. -  Jarely Juncaj d. Hal Norrington, NP-C

## 2021-03-27 ENCOUNTER — Other Ambulatory Visit: Payer: Self-pay

## 2021-03-27 ENCOUNTER — Inpatient Hospital Stay (HOSPITAL_COMMUNITY): Payer: No Typology Code available for payment source | Attending: Hematology

## 2021-03-27 DIAGNOSIS — N920 Excessive and frequent menstruation with regular cycle: Secondary | ICD-10-CM | POA: Diagnosis not present

## 2021-03-27 DIAGNOSIS — D72820 Lymphocytosis (symptomatic): Secondary | ICD-10-CM | POA: Diagnosis not present

## 2021-03-27 DIAGNOSIS — D500 Iron deficiency anemia secondary to blood loss (chronic): Secondary | ICD-10-CM | POA: Insufficient documentation

## 2021-03-27 LAB — CBC WITH DIFFERENTIAL/PLATELET
Abs Immature Granulocytes: 0.03 10*3/uL (ref 0.00–0.07)
Basophils Absolute: 0.1 10*3/uL (ref 0.0–0.1)
Basophils Relative: 1 %
Eosinophils Absolute: 0.1 10*3/uL (ref 0.0–0.5)
Eosinophils Relative: 1 %
HCT: 37 % (ref 36.0–46.0)
Hemoglobin: 12.3 g/dL (ref 12.0–15.0)
Immature Granulocytes: 0 %
Lymphocytes Relative: 51 %
Lymphs Abs: 5.1 10*3/uL — ABNORMAL HIGH (ref 0.7–4.0)
MCH: 30.4 pg (ref 26.0–34.0)
MCHC: 33.2 g/dL (ref 30.0–36.0)
MCV: 91.4 fL (ref 80.0–100.0)
Monocytes Absolute: 0.9 10*3/uL (ref 0.1–1.0)
Monocytes Relative: 9 %
Neutro Abs: 3.8 10*3/uL (ref 1.7–7.7)
Neutrophils Relative %: 38 %
Platelets: 318 10*3/uL (ref 150–400)
RBC: 4.05 MIL/uL (ref 3.87–5.11)
RDW: 14.3 % (ref 11.5–15.5)
WBC: 10 10*3/uL (ref 4.0–10.5)
nRBC: 0 % (ref 0.0–0.2)

## 2021-03-27 LAB — IRON AND TIBC
Iron: 51 ug/dL (ref 28–170)
Saturation Ratios: 16 % (ref 10.4–31.8)
TIBC: 315 ug/dL (ref 250–450)
UIBC: 264 ug/dL

## 2021-03-27 LAB — FERRITIN: Ferritin: 105 ng/mL (ref 11–307)

## 2021-03-27 NOTE — Progress Notes (Signed)
[place], [place] [address]   CLINIC:  Medical Oncology/Hematology  PCP:  Fayrene Helper, MD [address] [place] [address] [phone]   REASON FOR VISIT:  Follow-up for iron deficiency anemia  CURRENT THERAPY: Intermittent IV iron infusions (last Venofer 01/13/2021)  INTERVAL HISTORY:  [name] 50 y.o. female returns for routine follow-up of iron deficiency anemia.  She was last seen by Tarri Abernethy PA-C on 12/26/2020.  Her last IV iron infusions were 01/06/2021 and 01/13/2021.  At today's visit, she reports feeling fair.  No recent hospitalizations, surgeries, or changes in baseline health status.  Her energy initially improved after IV iron infusions, but has been progressively decreasing since she started bleeding two months ago.  She currently reports significant fatigue with energy about 40%.  She continues to experience menorrhagia, reports that she has had vaginal bleeding for the past 48 days - finally stopped bleeding 3 days ago.  Abnormal bleeding may have been paradoxically preceded by medications prescribed by GYN in attempt to better control bleeding.  She is following with GYN, and recent ultrasound showed a 4 cm uterine fibroid.  She is in the process of seeing if this can be surgically removed.  No other signs of blood loss such as bright red blood per rectum, melena, or epistaxis.  No pica, chest pain, dyspnea, or syncope.  She did have some episodes of lightheadedness while she as bleeding heavily.  She has 40 % energy and 100 % appetite. She endorses that she is maintaining a stable weight.    REVIEW OF SYSTEMS:  Review of Systems  Constitutional:  Positive for fatigue. Negative for appetite change, chills, diaphoresis, fever and unexpected weight change.  HENT:   Negative for lump/mass and nosebleeds.   Eyes:  Negative for eye problems.  Respiratory:  Negative for cough, hemoptysis and  shortness of breath.   Cardiovascular:  Negative for chest pain, leg swelling and palpitations.  Gastrointestinal:  Negative for abdominal pain (mild premenstrual abdominal pain), blood in stool, constipation, diarrhea, nausea and vomiting.  Genitourinary:  Positive for menstrual problem (menorrhagia). Negative for hematuria.   Skin: Negative.   Neurological:  Negative for dizziness, headaches and light-headedness.  Hematological:  Does not bruise/bleed easily.     PAST MEDICAL/SURGICAL HISTORY:  Past Medical History:  Diagnosis Date   Arthritis    Phreesia 03/29/2020   Back pain    DDD (degenerative disc disease), lumbar    Diabetes mellitus without complication ([place])    Phreesia 03/29/2020   Essential hypertension, benign    GERD (gastroesophageal reflux disease)    Hypertension    Phreesia 03/29/2020   Joint pain    Lactose intolerance    Lumbar herniated disc    Migraine headache 2007   Obesity    Since childhood    Osteoarthritis    Pneumonia 2007   Sacroiliac joint disease    Spinal disease    Supraventricular tachycardia [facility])    Status post RFA July 2015 - Dr. Lovena Le   Type 2 diabetes mellitus ([place])    Varicose vein of leg    Vitamin D deficiency    Weight loss    Hopitalized 4 years ago after loosing 200lb on a supervised program, malnurished with severe protein deficiency    Past Surgical History:  Procedure Laterality Date   ABLATION  12-28-2013   slow pathway modification of AVNRT by Dr Lovena Le   COLONOSCOPY N/A 11/17/2019  Procedure: COLONOSCOPY;  Surgeon: Daneil Dolin, MD;  Location: [facility];  Service: Endoscopy;  Laterality: N/A;  7:30   ELECTROPHYSIOLOGY STUDY N/A 12/28/2013   Procedure: ELECTROPHYSIOLOGY STUDY;  Surgeon: Evans Lance, MD;  Location: [facility] CATH LAB;  Service: Cardiovascular;  Laterality: N/A;   SUPRAVENTRICULAR TACHYCARDIA ABLATION N/A 12/28/2013   Procedure: SUPRAVENTRICULAR TACHYCARDIA ABLATION;  Surgeon: Evans Lance, MD;   Location: [facility] CATH LAB;  Service: Cardiovascular;  Laterality: N/A;   TUBAL LIGATION  1996     SOCIAL HISTORY:  Social History   Socioeconomic History   Marital status: Married    Spouse name: Not on file   Number of children: 2   Years of education: Not on file   Highest education level: Not on file  Occupational History   Occupation: English as a second language teacher at [employer]   Smoking status: Former    Packs/day: 0.50    Years: 7.00    Pack years: 3.50    Types: Cigarettes    Quit date: 08/05/2012    Years since quitting: 8.6   Smokeless tobacco: Never   Tobacco comments:    quit in 2014  Vaping Use   Vaping Use: Never used  Substance and Sexual Activity   Alcohol use: No    Alcohol/week: 0.0 standard drinks   Drug use: No   Sexual activity: Yes    Birth control/protection: Surgical    Comment: tubal ligation  Other Topics Concern   Not on file  Social History Narrative   Not on file   Social Determinants of Health   Financial Resource Strain: Low Risk    Difficulty of Paying Living Expenses: Not hard at all  Food Insecurity: No Food Insecurity   Worried About Charity fundraiser in the Last Year: Never true   [place]ib[place] in the Last Year: Never true  Transportation Needs: No Transportation Needs   Lack of Transportation (Medical): No   Lack of Transportation (Non-Medical): No  Physical Activity: Inactive   Days of Exercise per Week: 0 days   Minutes of Exercise per Session: 0 min  Stress: No Stress Concern Present   Feeling of Stress : Not at all  Social Connections: Moderately Integrated   Frequency of Communication with Friends and Family: More than three times a week   Frequency of Social Gatherings with Friends and Family: Once a week   Attends Religious Services: 1 to 4 times per year   Active Member of [employer] or Organizations: No   Attends Music therapist: Never   Marital Status: Married  Human resources officer Violence: Not At Risk   Fear of  Current or Ex-Partner: No   Emotionally Abused: No   Physically Abused: No   Sexually Abused: No    FAMILY HISTORY:  Family History  Problem Relation Age of Onset   Hypertension Mother    Diabetes Mother    Cancer Mother        Uterine    Heart failure Mother        CABG   Hyperlipidemia Mother    Heart disease Mother    Thyroid disease Mother    Cancer Father 53       Prostate    Hypertension Father    Bradycardia Father        Irregular heart beat    Hyperlipidemia Father    Kidney disease Father    Diabetes Brother    Diabetes Brother  Hypertension Brother    Hypertension Brother     CURRENT MEDICATIONS:  Outpatient Encounter Medications as of 03/28/2021  Medication Sig   acetaminophen (TYLENOL) 500 MG tablet Take 1,000 mg by mouth every 6 (six) hours as needed (for pain).   Cholecalciferol 25 MCG (1000 UT) tablet Take 1,000 Units by mouth daily.    cyclobenzaprine (FLEXERIL) 10 MG tablet TAKE 1 TABLET BY MOUTH AT BEDTIME   Diclofenac-miSOPROStol 50-0.2 MG TBEC TAKE 1 TABLET BY MOUTH TWICE DAILY AFTER A MEAL   famotidine (PEPCID) 40 MG tablet TAKE ONE TABLET ONCE DAILY WHEN YOU TAKE IBUPROFEN   ferrous sulfate 325 (65 FE) MG tablet Take 325 mg by mouth every other day.   gabapentin (NEURONTIN) 100 MG capsule TAKE ONE TO TWO CAPSULES BY  MOUTH AT BEDTIME FOR BACK PAIN (Patient taking differently: TAKE ONE TO TWO CAPSULES BY  MOUTH AT BEDTIME PRN FOR BACK PAIN)   losartan-hydrochlorothiazide (HYZAAR) 50-12.5 MG tablet TAKE 1 TABLET BY MOUTH ONCE DAILY   meloxicam (MOBIC) 7.5 MG tablet Take 1 tablet (7.5 mg total) by mouth daily.   Semaglutide, 1 MG/DOSE, (OZEMPIC, 1 MG/DOSE,) 4 MG/3ML SOPN Inject 1 mg into the skin once a week   Semaglutide, 1 MG/DOSE, 4 MG/3ML SOPN Inject 1 mg into the skin once a week.   No facility-administered encounter medications on file as of 03/28/2021.    ALLERGIES:  Allergies  Allergen Reactions   Codeine Nausea And Vomiting    Metoclopramide Hcl Other (See Comments)    Not in right state of mind     PHYSICAL EXAM:  ECOG PERFORMANCE STATUS: 1 - Symptomatic but completely ambulatory   There were no vitals filed for this visit. There were no vitals filed for this visit. Physical Exam Constitutional:      Appearance: Normal appearance.  HENT:     Head: Normocephalic and atraumatic.     Mouth/Throat:     Mouth: Mucous membranes are moist.  Eyes:     Extraocular Movements: Extraocular movements intact.     Pupils: Pupils are equal, round, and reactive to light.  Cardiovascular:     Rate and Rhythm: Normal rate and regular rhythm.     Pulses: Normal pulses.     Heart sounds: Normal heart sounds.  Pulmonary:     Effort: Pulmonary effort is normal.     Breath sounds: Normal breath sounds.  Abdominal:     General: Bowel sounds are normal.     Palpations: Abdomen is soft.     Tenderness: There is no abdominal tenderness.  Musculoskeletal:        General: No swelling.     Right lower leg: No edema.     Left lower leg: No edema.  Lymphadenopathy:     Cervical: No cervical adenopathy.  Skin:    General: Skin is warm and dry.  Neurological:     General: No focal deficit present.     Mental Status: She is alert and oriented to person, place, and time.  Psychiatric:        Mood and Affect: Mood normal.        Behavior: Behavior normal.     LABORATORY DATA:  I have reviewed the labs as listed.  CBC    Component Value Date/Time   WBC 10.0 03/27/2021 0811   RBC 4.05 03/27/2021 0811   HGB 12.3 03/27/2021 0811   HGB 9.9 (L) 09/23/2020 1501   HCT 37.0 03/27/2021 0811   HCT 31.9 (L)  09/23/2020 1501   PLT 318 03/27/2021 0811   PLT 281 09/23/2020 1501   MCV 91.4 03/27/2021 0811   MCV 77 (L) 09/23/2020 1501   MCH 30.4 03/27/2021 0811   MCHC 33.2 03/27/2021 0811   RDW 14.3 03/27/2021 0811   RDW 15.8 (H) 09/23/2020 1501   LYMPHSABS 5.1 (H) 03/27/2021 0811   LYMPHSABS 5.0 (H) 08/22/2020 0956    MONOABS 0.9 03/27/2021 0811   EOSABS 0.1 03/27/2021 0811   EOSABS 0.1 08/22/2020 0956   BASOSABS 0.1 03/27/2021 0811   BASOSABS 0.1 08/22/2020 0956   CMP Latest Ref Rng & Units 08/22/2020 08/01/2020 12/07/2019  Glucose 65 - 99 mg/dL 82 85 89  BUN 6 - 24 mg/dL 12 14 13   Creatinine 0.57 - 1.00 mg/dL 0.78 0.93 0.80  Sodium 134 - 144 mmol/L 136 140 139  Potassium 3.5 - 5.2 mmol/L 4.0 4.2 3.5  Chloride 96 - 106 mmol/L 99 101 98  CO2 20 - 29 mmol/L 20 22 25   Calcium 8.7 - 10.2 mg/dL 9.2 9.5 9.4  Total Protein 6.0 - 8.5 g/dL 7.5 - -  Total Bilirubin 0.0 - 1.2 mg/dL <0.2 - -  Alkaline Phos 44 - 121 IU/L 61 - -  AST 0 - 40 IU/L 14 - -  ALT 0 - 32 IU/L 7 - -    DIAGNOSTIC IMAGING:  I have independently reviewed the relevant imaging and discussed with the patient.  ASSESSMENT & PLAN: 1.  Iron deficiency anemia -  Secondary to chronic blood loss (menorrhagia) and malabsorption in the setting of PPI use - She has been bleeding heavily for the past 48 days (as of 03/28/2021), blood flow finally stopped 3 days ago - GYN work-up showed 4 cm uterine fibroid, which she is considering having surgically removed  - Failed to improve on oral iron tablets - Has received several rounds of IV Venofer, last given on 01/13/2021 - Hemoccult stool x3 negative, denies melena or bright red blood per rectum - Work-up for other causes of anemia unremarkable (normal methylmalonic acid, B12, folate, copper, LDH, IFE/SPEP/FLC); CMP (08/22/2020) showed normal creatinine 0.78, normal liver function. - Normal screening colonoscopy in May 2021; no history of EGD - Symptomatic with fatigue, lightheadedness  -  She admits to menorrhagia as above, but denies any other source of blood loss such as bright red blood per rectum or melena. - Most recent labs (03/27/2021): Hemoglobin 12.3, ferritin 105, iron saturation 16% - PLAN: No indication for IV iron at this time.  However, due to symptoms of fatigue and recent heavy bleeding, we  will repeat labs in 6 weeks and discuss via phone visit.       2.  Lymphocytosis - Review of labs shows that she has had mild lymphocytosis since at least March 2022 - Most recent CBC (03/27/2021) shows absolute lymphocytes of 5.1 - She denies any new lumps or bumps.  No fever, chills, night sweats, unintentional weight loss. - PLAN: We will check flow cytometry and repeat CBC with peripheral smear review.  We will schedule phone visit to discuss these results.  3.  Other history - Her other past medical history is significant for chronic back pain, history of SVT s/p ablation, hypertension, diet-controlled diabetes mellitus type 2. 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  She is former smoker (10 pack-year history), quit over 10 years ago.  She denies alcohol or drug use. - Patient's mother had uterine caner, father with prostate cancer.  No other known  family history of anemia or cancers.   PLAN SUMMARY & DISPOSITION: - Labs today (flow cytometry, CBC, peripheral smear review) - Repeat CBC and iron panel in 6 weeks - Phone visit in 6 weeks, after labs  All questions were answered. The patient knows to call the clinic with any problems, questions or concerns.  Medical decision making: Moderate  Time spent on visit: I spent 20 minutes counseling the patient face to face. The total time spent in the appointment was 30 minutes and more than 50% was on counseling.   Harriett Rush, PA-C  03/28/2021 9:40 AM

## 2021-03-28 ENCOUNTER — Inpatient Hospital Stay (HOSPITAL_COMMUNITY): Payer: No Typology Code available for payment source

## 2021-03-28 ENCOUNTER — Encounter (HOSPITAL_COMMUNITY): Payer: Self-pay | Admitting: Physician Assistant

## 2021-03-28 ENCOUNTER — Inpatient Hospital Stay (HOSPITAL_BASED_OUTPATIENT_CLINIC_OR_DEPARTMENT_OTHER): Payer: No Typology Code available for payment source | Admitting: Physician Assistant

## 2021-03-28 DIAGNOSIS — D72820 Lymphocytosis (symptomatic): Secondary | ICD-10-CM

## 2021-03-28 DIAGNOSIS — D500 Iron deficiency anemia secondary to blood loss (chronic): Secondary | ICD-10-CM

## 2021-03-28 DIAGNOSIS — N920 Excessive and frequent menstruation with regular cycle: Secondary | ICD-10-CM | POA: Diagnosis not present

## 2021-03-28 LAB — CBC WITH DIFFERENTIAL/PLATELET
Basophils Absolute: 0 10*3/uL (ref 0.0–0.1)
Basophils Relative: 0 %
Eosinophils Absolute: 0 10*3/uL (ref 0.0–0.5)
Eosinophils Relative: 0 %
HCT: 38.7 % (ref 36.0–46.0)
Hemoglobin: 12.8 g/dL (ref 12.0–15.0)
Lymphocytes Relative: 53 %
Lymphs Abs: 5.4 10*3/uL — ABNORMAL HIGH (ref 0.7–4.0)
MCH: 29.9 pg (ref 26.0–34.0)
MCHC: 33.1 g/dL (ref 30.0–36.0)
MCV: 90.4 fL (ref 80.0–100.0)
Monocytes Absolute: 1.2 10*3/uL — ABNORMAL HIGH (ref 0.1–1.0)
Monocytes Relative: 12 %
Neutro Abs: 3.6 10*3/uL (ref 1.7–7.7)
Neutrophils Relative %: 35 %
Platelets: 301 10*3/uL (ref 150–400)
RBC: 4.28 MIL/uL (ref 3.87–5.11)
RDW: 14.2 % (ref 11.5–15.5)
WBC: 10.2 10*3/uL (ref 4.0–10.5)
nRBC: 0 % (ref 0.0–0.2)

## 2021-03-28 NOTE — Patient Instructions (Signed)
[place] at [facility] Discharge Instructions  You were seen today by Tarri Abernethy PA-C for your iron deficiency anemia.  Despite your heavy bleeding for the past several weeks, your blood and iron levels look normal.  You do not need any IV iron at this time.  However, because you are feeling so fatigued, we will recheck your labs again in 6 weeks and follow-up with a phone visit in case you need IV iron at that time.  I would also like to run some tests due to your elevated lymphocytes (a type of white blood cell).  We will discuss those results at your follow-up visit in 6 weeks.  LABS: Labs today before leaving the hospital.  Labs again in 6 weeks.  OTHER TESTS: None at this time  MEDICATIONS: No changes to home medications  FOLLOW-UP APPOINTMENT: Phone visit in 6 weeks after labs.   Thank you for choosing [place] at [facility] to provide your oncology and hematology care.  To afford each patient quality time with our provider, please arrive at least 15 minutes before your scheduled appointment time.   If you have a lab appointment with the [facility] please come in thru the Main Entrance and check in at the main information desk.  You need to re-schedule your appointment should you arrive 10 or more minutes late.  We strive to give you quality time with our providers, and arriving late affects you and other patients whose appointments are after yours.  Also, if you no show three or more times for appointments you may be dismissed from the clinic at the providers discretion.     Again, thank you for choosing [facility].  Our hope is that these requests will decrease the amount of time that you wait before being seen by our physicians.       _____________________________________________________________  Should you have questions after your visit to [facility], please contact our office at [phone] and follow the prompts.  Our office hours are 8:00 a.m. and 4:30 p.m. Monday - Friday.  Please note that voicemails left after 4:00 p.m. may not be returned until the following business day.  We are closed weekends and major holidays.  You do have access to a nurse 24-7, just call the main number to the clinic [phone] and do not press any options, hold on the line and a nurse will answer the phone.    For prescription refill requests, have your pharmacy contact our office and allow 72 hours.    Due to Covid, you will need to wear a mask upon entering the hospital. If you do not have a mask, a mask will be given to you at the Main Entrance upon arrival. For doctor visits, patients may have 1 support person age 49 or older with them. For treatment visits, patients can not have anyone with them due to social distancing guidelines and our immunocompromised population.

## 2021-03-31 LAB — PATHOLOGIST SMEAR REVIEW

## 2021-04-01 LAB — SURGICAL PATHOLOGY

## 2021-04-09 ENCOUNTER — Other Ambulatory Visit: Payer: Self-pay

## 2021-04-09 ENCOUNTER — Encounter: Payer: Self-pay | Admitting: Obstetrics & Gynecology

## 2021-04-09 ENCOUNTER — Ambulatory Visit (INDEPENDENT_AMBULATORY_CARE_PROVIDER_SITE_OTHER): Payer: No Typology Code available for payment source | Admitting: Obstetrics & Gynecology

## 2021-04-09 ENCOUNTER — Other Ambulatory Visit (HOSPITAL_COMMUNITY): Payer: No Typology Code available for payment source

## 2021-04-09 VITALS — BP 113/66 | HR 74 | Ht 71.0 in | Wt 191.8 lb

## 2021-04-09 DIAGNOSIS — N939 Abnormal uterine and vaginal bleeding, unspecified: Secondary | ICD-10-CM

## 2021-04-09 DIAGNOSIS — D250 Submucous leiomyoma of uterus: Secondary | ICD-10-CM

## 2021-04-09 NOTE — Progress Notes (Signed)
   GYN VISIT Patient name: [name] MRN [id number]  Date of birth: 12-19-1970 Chief Complaint:   Follow-up (Wants to discuss surgical options)  History of Present Illness:   [name] is a 50 y.o. G4P2  female being seen today for the following concerns:  AUB:   She recently had a period that lasted about 48 days Tried mini-pill- had bleeding, then switched to Megace and then made the bleeding worse.  Stopped Megace- Menses started Aug 18 and didn't end until Oct. 1st.  Prior to August- menses were regular each month, lasted 9 days and extremely heavy.  Wearing overnight pad/tampon together.  Denies intermenstrual bleeding.  Bleeding led to iron-def. Anemia requiring iron transfusion.  [place] completed as part of her work up: 8cm uterus with submucosal 4.2cm fibroid- appears to be within endometrium.  Normal ovaries bilaterally  Patient's last menstrual period was 02/06/2021.  Depression screen [facility] 2/9 02/12/2021 01/20/2021 10/09/2020 08/22/2020 08/06/2020  Decreased Interest 0 0 0 1 0  Down, Depressed, Hopeless 0 0 1 1 0  PHQ - 2 Score 0 0 1 2 0  Altered sleeping - 0 - 0 -  Tired, decreased energy - 2 - 2 -  Change in appetite - 0 - 3 -  Feeling bad or failure about yourself  - 0 - 1 -  Trouble concentrating - 0 - 0 -  Moving slowly or fidgety/restless - 0 - 0 -  Suicidal thoughts - 0 - 0 -  PHQ-9 Score - 2 - 8 -  Difficult doing work/chores - - - Not difficult at all -  Some encounter information is confidential and restricted. Go to Review Flowsheets activity to see all data.  Some recent data might be hidden     Review of Systems:   Pertinent items are noted in HPI Denies fever/chills, dizziness, headaches, visual disturbances, fatigue, shortness of breath, chest pain, abdominal pain, vomiting, bowel movements, urination, or intercourse unless otherwise stated above.  Pertinent History Reviewed:  Reviewed past medical,surgical, social, obstetrical and family  history.  Reviewed problem list, medications and allergies. Physical Assessment:   Vitals:   04/09/21 0855  BP: 113/66  Pulse: 74  Weight: 191 lb 12.8 oz (87 kg)  Height: 5\' 11"  (1.803 m)  Body mass index is 26.75 kg/m.       Physical Examination:   General appearance: alert, well appearing, and in no distress  Psych: mood appropriate, normal affect  Skin: warm & dry   Cardiovascular: normal heart rate noted  Respiratory: normal respiratory effort, no distress  Abdomen: soft, non-tender   Pelvic: normal external genitalia, vulva, vagina, cervix, uterus and adnexa- mobile firm uterus appreciated  Extremities: no edema   Chaperone: Celene Squibb    Assessment & Plan:  1) Abnormal uterine bleeding, Submucosal fibroid -Reviewed conservative options including:  IUD- though concern about placement  Medication- like Myfemb[place], previously failed progesterone only treatment -Reviewed surgical intervention  Reviewed hysterectomy- discussed risk/benefit  Discussed [place]- based on location of fibroid- great alternative to hysterectomy.  Discussed risk/benefit  Pt given handout with all her options- she plans to do some research on her own []  plan to check back on Monday regarding her final decision  No orders of the defined types were placed in this encounter.   No follow-ups on file.   Janyth Pupa, DO Attending [place], [facility] for [employer], [place]

## 2021-04-14 ENCOUNTER — Other Ambulatory Visit: Payer: Self-pay

## 2021-04-14 ENCOUNTER — Encounter (INDEPENDENT_AMBULATORY_CARE_PROVIDER_SITE_OTHER): Payer: Self-pay | Admitting: Adult Health

## 2021-04-14 ENCOUNTER — Ambulatory Visit (INDEPENDENT_AMBULATORY_CARE_PROVIDER_SITE_OTHER): Payer: No Typology Code available for payment source | Admitting: Adult Health

## 2021-04-14 VITALS — BP 113/74 | HR 70 | Temp 97.9°F | Ht 71.0 in | Wt 184.0 lb

## 2021-04-14 DIAGNOSIS — Z9189 Other specified personal risk factors, not elsewhere classified: Secondary | ICD-10-CM

## 2021-04-14 DIAGNOSIS — E1169 Type 2 diabetes mellitus with other specified complication: Secondary | ICD-10-CM | POA: Diagnosis not present

## 2021-04-14 DIAGNOSIS — I10 Essential (primary) hypertension: Secondary | ICD-10-CM

## 2021-04-14 DIAGNOSIS — Z6830 Body mass index (BMI) 30.0-30.9, adult: Secondary | ICD-10-CM

## 2021-04-14 DIAGNOSIS — E669 Obesity, unspecified: Secondary | ICD-10-CM

## 2021-04-14 DIAGNOSIS — E559 Vitamin D deficiency, unspecified: Secondary | ICD-10-CM | POA: Diagnosis not present

## 2021-04-14 DIAGNOSIS — Z0000 Encounter for general adult medical examination without abnormal findings: Secondary | ICD-10-CM

## 2021-04-14 NOTE — Progress Notes (Signed)
Chief Complaint:   OBESITY [name] is here to discuss her progress with her obesity treatment plan along with follow-up of her obesity related diagnoses. [name] is on the Category 3 Plan and states she is following her eating plan approximately 90-97% of the time. [name] states she is walking for 60 minutes 3-4 times per week.  Today's visit was #: 11 Starting weight: 205 lbs Starting date: 08/22/2020 Today's weight: 184 lbs Today's date: 04/14/2021 Total lbs lost to date: 21 Total lbs lost since last in-office visit: 0  Interim History: [name]'s RMR today is 1498. She has lost 21 lbs and BP is excellent. Current BMI 25.8  Subjective:   1. Type 2 diabetes mellitus with other specified complication, without long-term current use of insulin (HCC) She is on Ozempic 1 mg once weekly - she denies mass in neck, dysphagia, dyspepsia, or persistent hoarseness. Units 7 yr ago  (10/23/13) 8 yr ago  (09/22/12) 8 yr ago  (04/18/12) 10 yr ago  (01/15/11) 10 yr ago  (09/05/10) 11 yr ago  (02/11/10)   Hgb A1c MFr Bld <5.7 % 8.7 High   7.8 High  CM  7.3 High  CM  6.5 High  CM  6.3 High  CM  6.5 High    08/22/20 A1c- 5.8- now well controlled.  2. Vitamin D deficiency She is on OTC vitamin D 1,000 IU daily.  3. Essential hypertension BP/HR stable at office visit.  4. Healthcare maintenance Family history of hyperlipidemia - both parents.  5. At risk for osteoporosis [name] is at higher risk of osteopenia and osteoporosis due to Vitamin D deficiency and obesity.   Assessment/Plan:   1. Type 2 diabetes mellitus with other specified complication, without long-term current use of insulin ([place]) Will check labs today.  - Hemoglobin A1c - Insulin, random  2. Vitamin D deficiency Will check vitamin D level today.  - VITAMIN D 25 Hydroxy (Vit-D Deficiency, Fractures)  3. Essential hypertension Check labs today.  - Comprehensive metabolic panel  4. Healthcare maintenance Check labs  today.  - Lipid panel  5. At risk for osteoporosis [name] was given approximately 15 minutes of osteoporosis prevention counseling today. [name] is at risk for osteopenia and osteoporosis due to her Vitamin D deficiency. She was encouraged to take her Vitamin D and follow her higher calcium diet and increase strengthening exercise to help strengthen her bones and decrease her risk of osteopenia and osteoporosis.  Repetitive spaced learning was employed today to elicit superior memory formation and behavioral change.  6. Obesity, current BMI 25.8  [name] is currently in the action stage of change. As such, her goal is to continue with weight loss efforts. She has agreed to the Category 3 Plan.   Provided calorie/protein goals for each meal within the Category 3 parameters.  Exercise goals:  As is.  Behavioral modification strategies: increasing lean protein intake, decreasing simple carbohydrates, meal planning and cooking strategies, keeping healthy foods in the home, and planning for success.  [name] has agreed to follow-up with our clinic in 6 weeks. She was informed of the importance of frequent follow-up visits to maximize her success with intensive lifestyle modifications for her multiple health conditions.   Objective:   Blood pressure 113/74, pulse 70, temperature 97.9 F (36.6 C), height 5\' 11"  (1.803 m), weight 184 lb (83.5 kg), SpO2 100 %. Body mass index is 25.66 kg/m.  General: Cooperative, alert, well developed, in no acute distress. HEENT: Conjunctivae and lids unremarkable.  Cardiovascular: Regular rhythm.  Lungs: Normal work of breathing. Neurologic: No focal deficits.   Lab Results  Component Value Date   CREATININE 0.78 08/22/2020   BUN 12 08/22/2020   NA 136 08/22/2020   K 4.0 08/22/2020   CL 99 08/22/2020   CO2 20 08/22/2020   Lab Results  Component Value Date   ALT 7 08/22/2020   AST 14 08/22/2020   ALKPHOS 61 08/22/2020   BILITOT <0.2 08/22/2020    Lab Results  Component Value Date   HGBA1C 5.8 (H) 08/22/2020   HGBA1C 5.4 03/15/2019   HGBA1C 5.4 08/23/2017   HGBA1C 5.3 08/22/2016   HGBA1C 5.5 04/20/2016   Lab Results  Component Value Date   INSULIN 7.8 08/22/2020   Lab Results  Component Value Date   TSH 1.250 08/22/2020   Lab Results  Component Value Date   CHOL 174 08/01/2020   HDL 69 08/01/2020   LDLCALC 91 08/01/2020   TRIG 75 08/01/2020   CHOLHDL 2.5 08/01/2020   Lab Results  Component Value Date   VD25OH 38.5 08/22/2020   VD25OH 30.4 12/07/2019   VD25OH 26 (L) 03/15/2019   Lab Results  Component Value Date   WBC 10.2 03/28/2021   HGB 12.8 03/28/2021   HCT 38.7 03/28/2021   MCV 90.4 03/28/2021   PLT 301 03/28/2021   Lab Results  Component Value Date   IRON 51 03/27/2021   TIBC 315 03/27/2021   FERRITIN 105 03/27/2021   Attestation Statements:   Reviewed by clinician on day of visit: allergies, medications, problem list, medical history, surgical history, family history, social history, and previous encounter notes.  I, Water quality scientist, CMA, am acting as Location manager for Mina Marble, NP.  I have reviewed the above documentation for accuracy and completeness, and I agree with the above. -  Tangela Dolliver d. Nicolemarie Wooley, NP-C

## 2021-04-15 ENCOUNTER — Telehealth: Payer: Self-pay | Admitting: Obstetrics & Gynecology

## 2021-04-15 ENCOUNTER — Telehealth: Payer: Self-pay | Admitting: Family Medicine

## 2021-04-15 NOTE — Telephone Encounter (Signed)
Pt came by office to let Dr. Nelda Marseille know what she has decided to go with. Pt wants to go with SONATA . Couldn't reach out via [employer]

## 2021-04-15 NOTE — Telephone Encounter (Signed)
Called patient as follow up to reviewed management of her AUB/fibroids.  She desires Sonata procedure.  Message sent to Dr. Sabra Heck to assist with next step.  Janyth Pupa, DO Attending [place], [facility] for [employer], [place]

## 2021-04-15 NOTE — Telephone Encounter (Signed)
Called patient earlier today- see other phone encounter.  Note sent to Dr. Sabra Heck

## 2021-04-16 ENCOUNTER — Encounter: Payer: Self-pay | Admitting: Orthopedic Surgery

## 2021-04-16 ENCOUNTER — Other Ambulatory Visit (INDEPENDENT_AMBULATORY_CARE_PROVIDER_SITE_OTHER): Payer: Self-pay | Admitting: Adult Health

## 2021-04-16 ENCOUNTER — Ambulatory Visit (INDEPENDENT_AMBULATORY_CARE_PROVIDER_SITE_OTHER): Payer: No Typology Code available for payment source | Admitting: Orthopedic Surgery

## 2021-04-16 ENCOUNTER — Encounter: Payer: Self-pay | Admitting: Family Medicine

## 2021-04-16 ENCOUNTER — Encounter (INDEPENDENT_AMBULATORY_CARE_PROVIDER_SITE_OTHER): Payer: Self-pay | Admitting: Adult Health

## 2021-04-16 ENCOUNTER — Other Ambulatory Visit: Payer: Self-pay

## 2021-04-16 ENCOUNTER — Ambulatory Visit: Payer: No Typology Code available for payment source

## 2021-04-16 ENCOUNTER — Other Ambulatory Visit (HOSPITAL_COMMUNITY): Payer: Self-pay

## 2021-04-16 VITALS — BP 127/76 | HR 73 | Ht 71.0 in | Wt 184.0 lb

## 2021-04-16 DIAGNOSIS — M25511 Pain in right shoulder: Secondary | ICD-10-CM

## 2021-04-16 DIAGNOSIS — G8929 Other chronic pain: Secondary | ICD-10-CM

## 2021-04-16 DIAGNOSIS — R7303 Prediabetes: Secondary | ICD-10-CM

## 2021-04-16 DIAGNOSIS — M7541 Impingement syndrome of right shoulder: Secondary | ICD-10-CM | POA: Diagnosis not present

## 2021-04-16 DIAGNOSIS — M792 Neuralgia and neuritis, unspecified: Secondary | ICD-10-CM

## 2021-04-16 LAB — VITAMIN D 25 HYDROXY (VIT D DEFICIENCY, FRACTURES): Vit D, 25-Hydroxy: 29.2 ng/mL — ABNORMAL LOW (ref 30.0–100.0)

## 2021-04-16 LAB — COMPREHENSIVE METABOLIC PANEL
ALT: 6 IU/L (ref 0–32)
AST: 10 IU/L (ref 0–40)
Albumin/Globulin Ratio: 1.5 (ref 1.2–2.2)
Albumin: 4.5 g/dL (ref 3.8–4.9)
Alkaline Phosphatase: 43 IU/L — ABNORMAL LOW (ref 44–121)
BUN/Creatinine Ratio: 13 (ref 9–23)
BUN: 12 mg/dL (ref 6–24)
Bilirubin Total: <0.2 mg/dL (ref 0.0–1.2)
CO2: 23 mmol/L (ref 20–29)
Calcium: 9.7 mg/dL (ref 8.7–10.2)
Chloride: 103 mmol/L (ref 96–106)
Creatinine, Ser: 0.91 mg/dL (ref 0.57–1.00)
Globulin, Total: 3 g/dL (ref 1.5–4.5)
Glucose: 78 mg/dL (ref 70–99)
Potassium: 4.2 mmol/L (ref 3.5–5.2)
Sodium: 138 mmol/L (ref 134–144)
Total Protein: 7.5 g/dL (ref 6.0–8.5)
eGFR: 76 mL/min/{1.73_m2} (ref >59)

## 2021-04-16 LAB — LIPID PANEL
Chol/HDL Ratio: 3 ratio (ref 0.0–4.4)
Cholesterol, Total: 216 mg/dL — ABNORMAL HIGH (ref 100–199)
HDL: 71 mg/dL (ref >39)
LDL Chol Calc (NIH): 124 mg/dL — ABNORMAL HIGH (ref 0–99)
Triglycerides: 119 mg/dL (ref 0–149)
VLDL Cholesterol Cal: 21 mg/dL (ref 5–40)

## 2021-04-16 LAB — HEMOGLOBIN A1C
Est. average glucose Bld gHb Est-mCnc: 97 mg/dL
Hgb A1c MFr Bld: 5 % (ref 4.8–5.6)

## 2021-04-16 LAB — INSULIN, RANDOM: INSULIN: 19.2 u[IU]/mL (ref 2.6–24.9)

## 2021-04-16 MED ORDER — HYDROCODONE-ACETAMINOPHEN 5-325 MG PO TABS
1.0000 | ORAL_TABLET | Freq: Four times a day (QID) | ORAL | 0 refills | Status: DC | PRN
Start: 2021-04-16 — End: 2021-11-04
  Filled 2021-04-16: qty 30, 8d supply, fill #0

## 2021-04-16 MED ORDER — VITAMIN D (ERGOCALCIFEROL) 1.25 MG (50000 UNIT) PO CAPS
50000.0000 [IU] | ORAL_CAPSULE | ORAL | 0 refills | Status: DC
  Filled 2021-04-16: qty 8, 56d supply, fill #0

## 2021-04-16 MED ORDER — SEMAGLUTIDE (1 MG/DOSE) 4 MG/3ML ~~LOC~~ SOPN
1.0000 mg | PEN_INJECTOR | SUBCUTANEOUS | 0 refills | Status: DC
  Filled 2021-04-16: qty 3, 28d supply, fill #0
  Filled 2021-05-14: qty 3, 28d supply, fill #1

## 2021-04-16 NOTE — Progress Notes (Signed)
Chief Complaint  Patient presents with   Shoulder Pain    Cant lie on right shoulder states wakes her up at night    Arm Problem    Pain into right arm down back of hand states aching pain    50 year old female check on right rotator cuff syndrome/impingement  She is still complaining of pain at night she cannot roll over into her right side    Focused exam strength in the rotator cuff is 5- out of 5 in abduction 4 out of 5 in the empty can position positive impingement 120 degrees  She complained of some pain into her right hand so we got a C-spine x-ray showed a small osteophyte at the anterior inferior edge of C5 but otherwise normal no uncovertebral joint arthritis was seen  Recommend MRI right shoulder.  She would like something to take at night for pain and we decided to give her Norco 5 mg at bedtime  Encounter Diagnoses  Name Primary?   Radicular pain in right arm    Shoulder impingement, right Yes   Chronic right shoulder pain     Meds ordered this encounter  Medications   HYDROcodone-acetaminophen (NORCO/VICODIN) 5-325 MG tablet    Sig: Take 1 tablet by mouth every 6 (six) hours as needed for moderate pain.    Dispense:  30 tablet    Refill:  0   (Problem probably last 1 year, prescription written)

## 2021-04-16 NOTE — Telephone Encounter (Signed)
Please review

## 2021-04-16 NOTE — Telephone Encounter (Signed)
Katy ?

## 2021-04-16 NOTE — Telephone Encounter (Signed)
Last seen [name] 

## 2021-04-16 NOTE — Patient Instructions (Signed)
While we are working on your approval for MRI please go ahead and call to schedule your appointment with [facility] within at least one (1) week.   Central Scheduling [phone]  

## 2021-04-17 ENCOUNTER — Other Ambulatory Visit (HOSPITAL_COMMUNITY): Payer: Self-pay

## 2021-04-18 ENCOUNTER — Other Ambulatory Visit: Payer: Self-pay

## 2021-04-18 DIAGNOSIS — N920 Excessive and frequent menstruation with regular cycle: Secondary | ICD-10-CM

## 2021-04-18 NOTE — Telephone Encounter (Signed)
Put referral in for gyne

## 2021-04-23 ENCOUNTER — Other Ambulatory Visit: Payer: Self-pay

## 2021-04-23 ENCOUNTER — Other Ambulatory Visit (HOSPITAL_COMMUNITY): Payer: Self-pay

## 2021-04-23 ENCOUNTER — Encounter: Payer: Self-pay | Admitting: Family Medicine

## 2021-04-23 ENCOUNTER — Ambulatory Visit (INDEPENDENT_AMBULATORY_CARE_PROVIDER_SITE_OTHER): Payer: No Typology Code available for payment source | Admitting: Family Medicine

## 2021-04-23 VITALS — BP 118/77 | HR 69 | Resp 16 | Ht 71.0 in | Wt 189.0 lb

## 2021-04-23 DIAGNOSIS — Z1231 Encounter for screening mammogram for malignant neoplasm of breast: Secondary | ICD-10-CM

## 2021-04-23 DIAGNOSIS — N921 Excessive and frequent menstruation with irregular cycle: Secondary | ICD-10-CM | POA: Diagnosis not present

## 2021-04-23 DIAGNOSIS — Z0000 Encounter for general adult medical examination without abnormal findings: Secondary | ICD-10-CM | POA: Diagnosis not present

## 2021-04-23 DIAGNOSIS — E785 Hyperlipidemia, unspecified: Secondary | ICD-10-CM

## 2021-04-23 DIAGNOSIS — E559 Vitamin D deficiency, unspecified: Secondary | ICD-10-CM

## 2021-04-23 DIAGNOSIS — I10 Essential (primary) hypertension: Secondary | ICD-10-CM

## 2021-04-23 NOTE — Patient Instructions (Signed)
F/u in 6 months, call if you need me sooner  Keep up the great work!  Reduce fried and fatty foods.  Next mammogram is due in May  Fasting lipid, cmp and eGHFr and Vit d 5 days before your next visit.  It is important that you exercise regularly at least 30 minutes 5 times a week. If you develop chest pain, have severe difficulty breathing, or feel very tired, stop exercising immediately and seek medical attention   Think about what you will eat, plan ahead. Choose " clean, green, fresh or frozen" over canned, processed or packaged foods which are more sugary, salty and fatty. 70 to 75% of food eaten should be vegetables and fruit. Three meals at set times with snacks allowed between meals, but they must be fruit or vegetables. Aim to eat over a 12 hour period , example 7 am to 7 pm, and STOP after  your last meal of the day. Drink water,generally about 64 ounces per day, no other drink is as healthy. Fruit juice is best enjoyed in a healthy way, by EATING the fruit. Thanks for choosing [facility], we consider it a privelige to serve you.

## 2021-04-23 NOTE — Progress Notes (Signed)
    [name]     MRN: [id number]      DOB: [birth year]  HPI: Patient is in for annual physical exam. No other health concerns are expressed or addressed at the visit. Recent labs,  are reviewed. Immunization is reviewed , and  updated if needed.   PE: BP 118/77   Pulse 69   Resp 16   Ht 5\' 11"  (1.803 m)   Wt 189 lb (85.7 kg)   LMP 04/08/2021   SpO2 98%   BMI 26.36 kg/m   Pleasant  female, alert and oriented x 3, in no cardio-pulmonary distress. Afebrile. HEENT No facial trauma or asymetry. Sinuses non tender.  Extra occullar muscles intact.. External ears normal, . Neck: supple, no adenopathy,JVD or thyromegaly.No bruits.  Chest: Clear to ascultation bilaterally.No crackles or wheezes. Non tender to palpation  Breast: No asymetry,no masses or lumps. No tenderness. No nipple discharge or inversion. No axillary or supraclavicular adenopathy  Cardiovascular system; Heart sounds normal,  S1 and  S2 ,no S3.  No murmur, or thrill. Apical beat not displaced Peripheral pulses normal.  Abdomen: Soft, non tender, no organomegaly or masses. No bruits. Bowel sounds normal. No guarding, tenderness or rebound.     Musculoskeletal exam: Decreased though adequate  ROM of spine, hips , shoulders and knees. No deformity ,swelling or crepitus noted. No muscle wasting or atrophy.   Neurologic: Cranial nerves 2 to 12 intact. Power, tone ,sensation and reflexes normal throughout. No disturbance in gait. No tremor.  Skin: Intact, no ulceration, erythema , scaling or rash noted. Pigmentation normal throughout  Psych; Normal mood and affect. Judgement and concentration normal   Assessment & Plan:  Encounter for annual physical exam Annual exam as documented. Counseling done  re healthy lifestyle involving commitment to 150 minutes exercise per week, heart healthy diet, and attaining healthy weight.The importance of adequate sleep also discussed. Regular seat  belt use and home safety, is also discussed. Changes in health habits are decided on by the patient with goals and time frames  set for achieving them. Immunization and cancer screening needs are specifically addressed at this visit.

## 2021-04-24 ENCOUNTER — Other Ambulatory Visit (HOSPITAL_COMMUNITY): Payer: Self-pay

## 2021-04-25 ENCOUNTER — Other Ambulatory Visit (HOSPITAL_COMMUNITY): Payer: Self-pay

## 2021-04-27 ENCOUNTER — Encounter: Payer: Self-pay | Admitting: Family Medicine

## 2021-04-27 NOTE — Assessment & Plan Note (Signed)

## 2021-04-29 ENCOUNTER — Ambulatory Visit (HOSPITAL_COMMUNITY)
Admission: RE | Admit: 2021-04-29 | Discharge: 2021-04-29 | Disposition: A | Discharge status: Home / Self Care | Payer: No Typology Code available for payment source | Source: Ambulatory Visit | Attending: Orthopedic Surgery | Admitting: Orthopedic Surgery

## 2021-04-29 ENCOUNTER — Other Ambulatory Visit: Payer: Self-pay

## 2021-04-29 DIAGNOSIS — M7541 Impingement syndrome of right shoulder: Secondary | ICD-10-CM | POA: Diagnosis present

## 2021-04-29 DIAGNOSIS — G8929 Other chronic pain: Secondary | ICD-10-CM | POA: Diagnosis present

## 2021-04-29 DIAGNOSIS — M25511 Pain in right shoulder: Secondary | ICD-10-CM | POA: Diagnosis present

## 2021-04-30 ENCOUNTER — Encounter: Payer: Self-pay | Admitting: Orthopedic Surgery

## 2021-04-30 ENCOUNTER — Ambulatory Visit (INDEPENDENT_AMBULATORY_CARE_PROVIDER_SITE_OTHER): Payer: No Typology Code available for payment source | Admitting: Orthopedic Surgery

## 2021-04-30 DIAGNOSIS — M7541 Impingement syndrome of right shoulder: Secondary | ICD-10-CM

## 2021-04-30 NOTE — Progress Notes (Signed)
Chief Complaint  Patient presents with   Shoulder Pain    Right/ review MRI    Encounter Diagnosis  Name Primary?   Shoulder impingement, right Yes   I READ THE MRI   TENDINOSIS AND INTERSTITIAL TEAR RIGHT ROTATOR CUFF   IMPRESSION: 1. Mild tendinosis of the supraspinatus tendon with a tiny insertional interstitial tear. 2. Mild tendinosis of the infraspinatus tendon. Mild muscle edema in the infraspinatus muscle at the musculotendinous junction likely reflecting mild muscle strain.     Electronically Signed   By: Kathreen Devoid M.D.   On: 04/30/2021 08:47  MRI was reviewed and the findings with the patient.  Did not feel that tear is significant enough for surgery  Recommend exercises, anti-inflammatories, she can take some hydrocodone at night and get injections every 4 to 6 months if needed.

## 2021-04-30 NOTE — Patient Instructions (Signed)
TAKE THE DICLOFENAC  EXERCISES 3 X A WEEK   NORCO HS   CALL IN 2-3 MOS FOR INJECTION IF NEEDED

## 2021-05-01 LAB — FLOW CYTOMETRY

## 2021-05-02 ENCOUNTER — Other Ambulatory Visit: Payer: Self-pay

## 2021-05-02 ENCOUNTER — Inpatient Hospital Stay (HOSPITAL_COMMUNITY): Payer: No Typology Code available for payment source | Attending: Hematology

## 2021-05-02 DIAGNOSIS — D500 Iron deficiency anemia secondary to blood loss (chronic): Secondary | ICD-10-CM | POA: Diagnosis present

## 2021-05-02 DIAGNOSIS — N920 Excessive and frequent menstruation with regular cycle: Secondary | ICD-10-CM | POA: Diagnosis present

## 2021-05-02 LAB — CBC WITH DIFFERENTIAL/PLATELET
Abs Immature Granulocytes: 0.01 10*3/uL (ref 0.00–0.07)
Basophils Absolute: 0.1 10*3/uL (ref 0.0–0.1)
Basophils Relative: 1 %
Eosinophils Absolute: 0.1 10*3/uL (ref 0.0–0.5)
Eosinophils Relative: 2 %
HCT: 38.6 % (ref 36.0–46.0)
Hemoglobin: 12.9 g/dL (ref 12.0–15.0)
Immature Granulocytes: 0 %
Lymphocytes Relative: 57 %
Lymphs Abs: 4.3 10*3/uL — ABNORMAL HIGH (ref 0.7–4.0)
MCH: 30 pg (ref 26.0–34.0)
MCHC: 33.4 g/dL (ref 30.0–36.0)
MCV: 89.8 fL (ref 80.0–100.0)
Monocytes Absolute: 0.7 10*3/uL (ref 0.1–1.0)
Monocytes Relative: 9 %
Neutro Abs: 2.2 10*3/uL (ref 1.7–7.7)
Neutrophils Relative %: 31 %
Platelets: 294 10*3/uL (ref 150–400)
RBC: 4.3 MIL/uL (ref 3.87–5.11)
RDW: 13.1 % (ref 11.5–15.5)
WBC: 7.4 10*3/uL (ref 4.0–10.5)
nRBC: 0 % (ref 0.0–0.2)

## 2021-05-02 LAB — IRON AND TIBC
Iron: 56 ug/dL (ref 28–170)
Saturation Ratios: 17 % (ref 10.4–31.8)
TIBC: 322 ug/dL (ref 250–450)
UIBC: 266 ug/dL

## 2021-05-02 LAB — FERRITIN: Ferritin: 89 ng/mL (ref 11–307)

## 2021-05-03 NOTE — Progress Notes (Signed)
Virtual Visit via Telephone Note [facility]  I connected with [name]  on 05/06/21 at  8:25 AM by telephone and verified that I am speaking with the correct person using two identifiers.  Location: Patient: Home Provider: [facility]   I discussed the limitations, risks, security and privacy concerns of performing an evaluation and management service by telephone and the availability of in person appointments. I also discussed with the patient that there may be a patient responsible charge related to this service. The patient expressed understanding and agreed to proceed.   HISTORY OF PRESENT ILLNESS: [name] follows in our clinic for iron deficiency anemia secondary to menorrhagia.  She was last seen by Tarri Abernethy PA-C on 03/28/2021.  At today's visit, she reports feeling well.  She denies any recent hospitalizations, surgeries, or changes in her baseline health status.  She continues to have menorrhagia, but has not had any further episodes of extremely prolonged menses (48 days), and reports that she has returned to her "usual" 9-day bleeding.  She denies any other sources of blood loss such as hematemesis, hematochezia, melena, or epistaxis.  She reports that her energy is "pretty good" at 75%.  She denies any pica, restless legs, chest pain, dyspnea on exertion, lightheadedness, or syncope.  She has not noticed any new lumps or bumps.  No B symptoms such as fever, chills, night sweats, unintentional weight loss.  She reports 75% energy and 100% appetite.  She is maintaining stable weight at this time.    OBSERVATIONS/OBJECTIVE: Review of Systems  Constitutional:  Negative for chills, diaphoresis, fever, malaise/fatigue and weight loss.  Respiratory:  Negative for cough and shortness of breath.   Cardiovascular:  Negative for chest pain and palpitations.  Gastrointestinal:  Negative for abdominal pain, blood in stool,  melena, nausea and vomiting.  Musculoskeletal:  Positive for joint pain (right shoulder).  Neurological:  Negative for dizziness and headaches.    PHYSICAL EXAM (per limitations of virtual telephone visit): The patient is alert and oriented x 3, exhibiting adequate mentation, good mood, and ability to speak in full sentences and execute sound judgement.   ASSESSMENT & PLAN: 1.  Iron deficiency anemia -  Secondary to chronic blood loss (menorrhagia) and malabsorption in the setting of PPI use - Episode of heavy bleeding in October 2022, reportedly bled for about 48 consecutive days.  Continues to have heavy periods lasting 7 to 10 days. - GYN work-up showed 4 cm uterine fibroid, which she is considering having surgically removed  - Failed to improve on oral iron tablets - Has received several rounds of IV Venofer, last given on 01/13/2021 - Hemoccult stool x3 negative, denies melena or bright red blood per rectum - Work-up for other causes of anemia unremarkable (normal methylmalonic acid, B12, folate, copper, LDH, IFE/SPEP/FLC); CMP (08/22/2020) showed normal creatinine 0.78, normal liver function. - Normal screening colonoscopy in May 2021; no history of EGD - Currently asymptomatic, reports energy at about 75% -  She admits to menorrhagia as above, but denies any other source of blood loss such as bright red blood per rectum or melena.   - Most recent labs (05/02/2021): Hgb 12.9, iron saturation 17%, ferritin 89 - PLAN: We will hold off on any IV iron for the time being.  - Repeat labs and RTC in 3 months.  Patient has been advised to call the clinic sooner if she starts to feel that her iron is low  and needs labs checked.  2.  Lymphocytosis - Review of labs shows that she has had mild lymphocytosis since at least March 2022 - Pathology smear review confirmed absolute lymphocytosis - Flow cytometry (03/28/2021) showed predominance of T cells with relative abundance of CD8 positive cells, no  monoclonal B-cell population identified.  T-cell changes are not considered specific or diagnostic of limbo proliferative disorder at this time may be secondary to infection or autoimmune disease, etc.  Clinical correlation and follow-up recommended. - She denies any new lumps or bumps.  No fever, chills, night sweats, unintentional weight loss.   - Most recent CBC (05/02/2021): Absolute lymphocytes mildly elevated at 4.3 - PLAN: We will continue to monitor with repeat CBCs and consider repeat flow cytometry in 6 to 12 months if persistent lymphocytosis.  3.  Other history - Her other past medical history is significant for chronic back pain, history of SVT s/p ablation, hypertension, diet-controlled diabetes mellitus type 2. 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  She is former smoker (10 pack-year history), quit over 10 years ago.  She denies alcohol or drug use. - Patient's mother had uterine caner, father with prostate cancer.  No other known family history of anemia or cancers.   FOLLOW UP INSTRUCTIONS: Labs and RTC in 3 months    I discussed the assessment and treatment plan with the patient. The patient was provided an opportunity to ask questions and all were answered. The patient agreed with the plan and demonstrated an understanding of the instructions.   The patient was advised to call back or seek an in-person evaluation if the symptoms worsen or if the condition fails to improve as anticipated.  I provided 15 minutes of non-face-to-face time during this encounter.   Harriett Rush, PA-C 05/06/2021 8:39 AM

## 2021-05-06 ENCOUNTER — Other Ambulatory Visit: Payer: Self-pay

## 2021-05-06 ENCOUNTER — Inpatient Hospital Stay (HOSPITAL_BASED_OUTPATIENT_CLINIC_OR_DEPARTMENT_OTHER): Payer: No Typology Code available for payment source | Admitting: Physician Assistant

## 2021-05-06 DIAGNOSIS — D500 Iron deficiency anemia secondary to blood loss (chronic): Secondary | ICD-10-CM

## 2021-05-06 DIAGNOSIS — D72820 Lymphocytosis (symptomatic): Secondary | ICD-10-CM | POA: Diagnosis not present

## 2021-05-14 ENCOUNTER — Other Ambulatory Visit (HOSPITAL_COMMUNITY): Payer: Self-pay

## 2021-05-26 ENCOUNTER — Ambulatory Visit (INDEPENDENT_AMBULATORY_CARE_PROVIDER_SITE_OTHER): Payer: No Typology Code available for payment source | Admitting: Adult Health

## 2021-05-26 ENCOUNTER — Other Ambulatory Visit (HOSPITAL_COMMUNITY): Payer: Self-pay

## 2021-05-26 ENCOUNTER — Encounter (INDEPENDENT_AMBULATORY_CARE_PROVIDER_SITE_OTHER): Payer: Self-pay | Admitting: Adult Health

## 2021-05-26 ENCOUNTER — Other Ambulatory Visit: Payer: Self-pay

## 2021-05-26 VITALS — BP 115/72 | HR 74 | Temp 97.8°F | Ht 71.0 in | Wt 180.0 lb

## 2021-05-26 DIAGNOSIS — E119 Type 2 diabetes mellitus without complications: Secondary | ICD-10-CM | POA: Insufficient documentation

## 2021-05-26 DIAGNOSIS — E663 Overweight: Secondary | ICD-10-CM | POA: Diagnosis not present

## 2021-05-26 DIAGNOSIS — E785 Hyperlipidemia, unspecified: Secondary | ICD-10-CM | POA: Diagnosis not present

## 2021-05-26 DIAGNOSIS — E1169 Type 2 diabetes mellitus with other specified complication: Secondary | ICD-10-CM

## 2021-05-26 DIAGNOSIS — Z9189 Other specified personal risk factors, not elsewhere classified: Secondary | ICD-10-CM | POA: Diagnosis not present

## 2021-05-26 DIAGNOSIS — E559 Vitamin D deficiency, unspecified: Secondary | ICD-10-CM

## 2021-05-26 MED ORDER — VITAMIN D (ERGOCALCIFEROL) 1.25 MG (50000 UNIT) PO CAPS
50000.0000 [IU] | ORAL_CAPSULE | ORAL | 0 refills | Status: DC
  Filled 2021-05-26: qty 8, 56d supply, fill #0

## 2021-05-26 MED ORDER — SEMAGLUTIDE (1 MG/DOSE) 4 MG/3ML ~~LOC~~ SOPN
1.0000 mg | PEN_INJECTOR | SUBCUTANEOUS | 0 refills | Status: DC
  Filled 2021-05-26: qty 3, 28d supply, fill #0

## 2021-05-26 MED ORDER — ROSUVASTATIN CALCIUM 10 MG PO TABS
ORAL_TABLET | ORAL | 0 refills | Status: DC
Start: 2021-05-26 — End: 2021-06-18
  Filled 2021-05-26: qty 8, 28d supply, fill #0

## 2021-05-26 NOTE — Progress Notes (Signed)
Chief Complaint:   OBESITY [name] is here to discuss her progress with her obesity treatment plan along with follow-up of her obesity related diagnoses. [name] is on the Category 3 Plan and states she is following her eating plan approximately 98% of the time. [name] states she is walking 5-6 miles per week.  Today's visit was #: 12 Starting weight: 205 lbs Starting date: 08/22/2020 Today's weight: 180 lbs Today's date: 05/26/2021 Total lbs lost to date: 25 lbs Total lbs lost since last in-office visit: 4 lbs  Interim History:  She has increased walking (on lunch break and in evenings/weekends). weight is down another 4 pounds. Current weight 180 pounds, BMI 25.2 - stable.  Subjective:   1. Type 2 diabetes mellitus with other specified complication, without long-term current use of insulin ([place]) Discussed labs with patient today.  On 04/15/2021, A1c 5.0 - at goal. On Ozempic 1 mg once weekly.   She denies mass in neck, dysphagia, dyspepsia, persistent hoarseness, GI upset, or sx's of hypoglycemia.  2. Vitamin D deficiency Discussed labs with patient today.  On 04/15/2021, vitamin D level - 29.2 - well below goal of 50. She is currently taking prescription ergocalciferol 50,000 IU each week. She denies nausea, vomiting or muscle weakness.  3. Hyperlipidemia associated with type 2 diabetes mellitus ([place]) Discussed labs with patient today.  On 04/15/2021, lipid panel - total/LDL both above goal. Previously on Crestor - per PCP - last statin therapy 2017. The 10-year ASCVD risk score (Arnett DK, et al., 2019) is: 4.4%   Values used to calculate the score:     Age: 50 years     Sex: Female     Is Non-Hispanic African American: Yes     Diabetic: Yes     Tobacco smoker: No     Systolic Blood Pressure: 062 mmHg     Is BP treated: Yes     HDL Cholesterol: 71 mg/dL     Total Cholesterol: 216 mg/dL   4. At risk for heart disease [name] is at a higher than average risk for  cardiovascular disease due to hyperlipidemia and T2D.   Assessment/Plan:   1. Type 2 diabetes mellitus with other specified complication, without long-term current use of insulin (HCC) Refill Ozempic 1 mg once weekly, as per below.  - Refill Semaglutide, 1 MG/DOSE, 4 MG/3ML SOPN; Inject 1 mg into the skin once a week.  Dispense: 9 mL; Refill: 0  2. Vitamin D deficiency Check labs every 2-3 months. Refill ergocalciferol 50,000 IU once weekly.  - Refill Vitamin D, Ergocalciferol, (DRISDOL) 1.25 MG (50000 UNIT) CAPS capsule; Take 1 capsule by mouth every 7 days.  Dispense: 8 capsule; Refill: 0  3. Hyperlipidemia associated with type 2 diabetes mellitus (HCC) Check CMP/lipid panel in 6 weeks. Restart Crestor 10 mg twice weekly.  - Restart rosuvastatin (CRESTOR) 10 MG tablet; Take on tablet twice week (Wed/Sun)  Dispense: 8 tablet; Refill: 0  4. At risk for heart disease [name] was given approximately 15 minutes of coronary artery disease prevention counseling today. She is 50 y.o. female and has risk factors for heart disease including obesity. We discussed intensive lifestyle modifications today with an emphasis on specific weight loss instructions and strategies.   Repetitive spaced learning was employed today to elicit superior memory formation and behavioral change.  5. Overweight (BMI 25.0-29.9)  [name] is currently in the action stage of change. As such, her goal is to continue with weight loss efforts. She has  agreed to the Category 3 Plan.   Exercise goals:  As is.  Behavioral modification strategies: increasing lean protein intake, decreasing simple carbohydrates, meal planning and cooking strategies, keeping healthy foods in the home, and planning for success.  [name] has agreed to follow-up with our clinic in 6 weeks, fasting. She was informed of the importance of frequent follow-up visits to maximize her success with intensive lifestyle modifications for her multiple health  conditions.   Objective:   Blood pressure 115/72, pulse 74, temperature 97.8 F (36.6 C), height 5\' 11"  (1.803 m), weight 180 lb (81.6 kg), SpO2 100 %. Body mass index is 25.1 kg/m.  General: Cooperative, alert, well developed, in no acute distress. HEENT: Conjunctivae and lids unremarkable. Cardiovascular: Regular rhythm.  Lungs: Normal work of breathing. Neurologic: No focal deficits.   Lab Results  Component Value Date   CREATININE 0.91 04/15/2021   BUN 12 04/15/2021   NA 138 04/15/2021   K 4.2 04/15/2021   CL 103 04/15/2021   CO2 23 04/15/2021   Lab Results  Component Value Date   ALT 6 04/15/2021   AST 10 04/15/2021   ALKPHOS 43 (L) 04/15/2021   BILITOT <0.2 04/15/2021   Lab Results  Component Value Date   HGBA1C 5.0 04/15/2021   HGBA1C 5.8 (H) 08/22/2020   HGBA1C 5.4 03/15/2019   HGBA1C 5.4 08/23/2017   HGBA1C 5.3 08/22/2016   Lab Results  Component Value Date   INSULIN 19.2 04/15/2021   INSULIN 7.8 08/22/2020   Lab Results  Component Value Date   TSH 1.250 08/22/2020   Lab Results  Component Value Date   CHOL 216 (H) 04/15/2021   HDL 71 04/15/2021   LDLCALC 124 (H) 04/15/2021   TRIG 119 04/15/2021   CHOLHDL 3.0 04/15/2021   Lab Results  Component Value Date   VD25OH 29.2 (L) 04/15/2021   VD25OH 38.5 08/22/2020   VD25OH 30.4 12/07/2019   Lab Results  Component Value Date   WBC 7.4 05/02/2021   HGB 12.9 05/02/2021   HCT 38.6 05/02/2021   MCV 89.8 05/02/2021   PLT 294 05/02/2021   Lab Results  Component Value Date   IRON 56 05/02/2021   TIBC 322 05/02/2021   FERRITIN 89 05/02/2021   Attestation Statements:   Reviewed by clinician on day of visit: allergies, medications, problem list, medical history, surgical history, family history, social history, and previous encounter notes.  I, Water quality scientist, CMA, am acting as Location manager for Mina Marble, NP.  I have reviewed the above documentation for accuracy and completeness, and I  agree with the above. -  Chee Dimon d. Andalyn Heckstall, NP-C

## 2021-05-28 ENCOUNTER — Other Ambulatory Visit (HOSPITAL_COMMUNITY): Payer: Self-pay

## 2021-05-28 ENCOUNTER — Other Ambulatory Visit: Payer: Self-pay | Admitting: Family Medicine

## 2021-05-28 MED ORDER — FAMOTIDINE 40 MG PO TABS
ORAL_TABLET | ORAL | 0 refills | Status: DC
  Filled 2021-05-28: qty 90, 90d supply, fill #0

## 2021-06-05 ENCOUNTER — Other Ambulatory Visit (HOSPITAL_COMMUNITY): Payer: Self-pay

## 2021-06-10 ENCOUNTER — Institutional Professional Consult (permissible substitution) (HOSPITAL_BASED_OUTPATIENT_CLINIC_OR_DEPARTMENT_OTHER): Payer: No Typology Code available for payment source | Admitting: Obstetrics & Gynecology

## 2021-06-12 ENCOUNTER — Other Ambulatory Visit: Payer: Self-pay

## 2021-06-12 ENCOUNTER — Ambulatory Visit (INDEPENDENT_AMBULATORY_CARE_PROVIDER_SITE_OTHER): Payer: No Typology Code available for payment source | Admitting: Obstetrics & Gynecology

## 2021-06-12 ENCOUNTER — Encounter (HOSPITAL_BASED_OUTPATIENT_CLINIC_OR_DEPARTMENT_OTHER): Payer: Self-pay | Admitting: Obstetrics & Gynecology

## 2021-06-12 VITALS — BP 133/76 | HR 75 | Ht 71.0 in | Wt 181.2 lb

## 2021-06-12 DIAGNOSIS — D250 Submucous leiomyoma of uterus: Secondary | ICD-10-CM | POA: Diagnosis not present

## 2021-06-12 DIAGNOSIS — D500 Iron deficiency anemia secondary to blood loss (chronic): Secondary | ICD-10-CM | POA: Diagnosis not present

## 2021-06-12 DIAGNOSIS — N921 Excessive and frequent menstruation with irregular cycle: Secondary | ICD-10-CM | POA: Diagnosis not present

## 2021-06-17 ENCOUNTER — Other Ambulatory Visit: Payer: Self-pay | Admitting: Family Medicine

## 2021-06-17 ENCOUNTER — Other Ambulatory Visit (HOSPITAL_COMMUNITY): Payer: Self-pay

## 2021-06-17 DIAGNOSIS — D250 Submucous leiomyoma of uterus: Secondary | ICD-10-CM | POA: Insufficient documentation

## 2021-06-17 DIAGNOSIS — M5430 Sciatica, unspecified side: Secondary | ICD-10-CM

## 2021-06-17 MED ORDER — CYCLOBENZAPRINE HCL 10 MG PO TABS
ORAL_TABLET | Freq: Every day | ORAL | 0 refills | Status: DC
  Filled 2021-06-17: qty 90, 90d supply, fill #0

## 2021-06-17 NOTE — Progress Notes (Signed)
GYNECOLOGY  VISIT  CC:   consult for sonata procedure  HPI: 50 y.o. G4P2 Married [name] or [name] here for discussion of fibroid uterus and treatment options.  She's seen Otis Peak, MD, and was referred.  Bleeding has been heavier these past two years and she did have an episode of bleeding this late summer into fall that lasted 48 days.  Prior to that, cycles were regular but lasted up to 9 days with several of them being heavy.  She wears pads/tampons together and can leak through.  Hb was in the 9 range earlier this year.  She has needed IV iron to help with her anemia.  She is ready for treatment that will help the bleeding.  She has also undergone GI evaluation to ensure bleeding was not from another cause.  Myomectomy, [place], sonata, Hysterectomy all discussed.  She declines trying any more hormonal therapy.    Ultrasound was done 9/96/2022 with uterus measuring 8 x 5.6 x 6.3cm with total volume of 148cc with 11mm endometrium and 4.2cm submucosal fibroid.  I independently reviewed images and this appears to be a type 1 or 2 fibroid.  This would likely cause a lot of sloughing and possibly risks prolapse fibroid with [place].  I feel Sonata (with possible resection afterwards) is a good option for her.  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.  We did discuss these procedures, hospital stay, risks and benefits.  Pre certification will be needed.  Paperwork completed today.  Last pap smear 04/01/2020.  Will plan endometrial sampling with procedure.  Currently denies palpitations and/or SOB  GYNECOLOGIC HISTORY: Patient's last menstrual period was 05/13/2021. Contraception: BTL  Patient Active Problem List   Diagnosis Date Noted   Diabetes mellitus ([place]) 05/26/2021   Vitamin D deficiency 05/26/2021   Overweight (BMI 25.0-29.9) 05/26/2021   Shoulder pain, right 02/12/2021   Peri-menopause 01/20/2021   Menorrhagia with irregular cycle 01/20/2021    Pre-diabetes 11/19/2020   Absolute anemia 11/19/2020   Iron deficiency anemia due to chronic blood loss 10/10/2020   Abnormal CT scan, pelvis 04/01/2020   Symptomatic varicose veins, bilateral 09/01/2018   Dyspepsia 01/18/2017   Back pain with sciatica 09/20/2014   Solitary lung nodule 09/05/2014   Hyperlipidemia associated with type 2 diabetes mellitus ([place]) 01/26/2014   Encounter for annual physical exam 04/18/2012   Allergic rhinitis 09/08/2010   OTH ABNORMAL BRAIN & CNS FUNCTION STUDY 02/16/2010   GOITER, UNSPECIFIED 02/10/2010   Class 1 obesity with serious comorbidity and body mass index (BMI) of 30.0 to 30.9 in adult 02/10/2010   Essential hypertension 05/02/2009    Past Medical History:  Diagnosis Date   Arthritis    Phreesia 03/29/2020   Back pain    DDD (degenerative disc disease), lumbar    Diabetes mellitus without complication ([place])    Phreesia 03/29/2020   Essential hypertension, benign    GERD (gastroesophageal reflux disease)    Hypertension    Phreesia 03/29/2020   Joint pain    Lactose intolerance    Lumbar herniated disc    Migraine headache 2007   Obesity    Since childhood    Osteoarthritis    Pneumonia 2007   Sacroiliac joint disease    Spinal disease    Supraventricular tachycardia [facility])    Status post RFA July 2015 - Dr. Lovena Le   Type 2 diabetes mellitus ([place])    Varicose vein of leg  Vitamin D deficiency    Weight loss    Hopitalized 4 years ago after loosing 200lb on a supervised program, malnurished with severe protein deficiency     Past Surgical History:  Procedure Laterality Date   ABLATION  12-28-2013   slow pathway modification of AVNRT by Dr Lovena Le   COLONOSCOPY N/A 11/17/2019   Procedure: COLONOSCOPY;  Surgeon: Daneil Dolin, MD;  [facility];  Service: Endoscopy;  Laterality: N/A;  7:30   ELECTROPHYSIOLOGY STUDY N/A 12/28/2013   Procedure: ELECTROPHYSIOLOGY STUDY;  Surgeon: Evans Lance, MD;  Location: [facility] CATH LAB;   Service: Cardiovascular;  Laterality: N/A;   SUPRAVENTRICULAR TACHYCARDIA ABLATION N/A 12/28/2013   Procedure: SUPRAVENTRICULAR TACHYCARDIA ABLATION;  Surgeon: Evans Lance, MD;  Location: [facility] CATH LAB;  Service: Cardiovascular;  Laterality: N/A;   TUBAL LIGATION  1996    MEDS:   Current Outpatient Medications on File Prior to Visit  Medication Sig Dispense Refill   acetaminophen (TYLENOL) 500 MG tablet Take 1,000 mg by mouth every 6 (six) hours as needed (for pain).     cyclobenzaprine (FLEXERIL) 10 MG tablet TAKE 1 TABLET BY MOUTH AT BEDTIME 90 tablet 0   Diclofenac-miSOPROStol 50-0.2 MG TBEC TAKE 1 TABLET BY MOUTH TWICE DAILY AFTER A MEAL 60 tablet 3   famotidine (PEPCID) 40 MG tablet TAKE ONE TABLET ONCE DAILY WHEN YOU TAKE IBUPROFEN 90 tablet 0   ferrous sulfate 325 (65 FE) MG tablet Take 325 mg by mouth every other day.     HYDROcodone-acetaminophen (NORCO/VICODIN) 5-325 MG tablet Take 1 tablet by mouth every 6 (six) hours as needed for moderate pain. 30 tablet 0   losartan-hydrochlorothiazide (HYZAAR) 50-12.5 MG tablet TAKE 1 TABLET BY MOUTH ONCE DAILY 90 tablet 3   rosuvastatin (CRESTOR) 10 MG tablet Take one tablet by mouth twice weekly (Wed/Sun) 8 tablet 0   Semaglutide, 1 MG/DOSE, 4 MG/3ML SOPN Inject 1 mg into the skin once a week. 9 mL 0   Vitamin D, Ergocalciferol, (DRISDOL) 1.25 MG (50000 UNIT) CAPS capsule Take 1 capsule by mouth every 7 days. 8 capsule 0   No current facility-administered medications on file prior to visit.    ALLERGIES: Codeine and Metoclopramide hcl  Family History  Problem Relation Age of Onset   Hypertension Mother    Diabetes Mother    Cancer Mother        Uterine    Heart failure Mother        CABG   Hyperlipidemia Mother    Heart disease Mother    Thyroid disease Mother    Cancer Father 63       Prostate    Hypertension Father    Bradycardia Father        Irregular heart beat    Hyperlipidemia Father    Kidney disease Father     Diabetes Brother    Diabetes Brother    Hypertension Brother    Hypertension Brother     SH:  married, non smoker  Review of Systems  Genitourinary:        Menstrual bleeding issues  All other systems reviewed and are negative.  PHYSICAL EXAMINATION:    BP 133/76 (BP Location: Right Arm, Patient Position: Sitting, Cuff Size: Normal)    Pulse 75    Ht 5\' 11"  (1.803 m) Comment: reported   Wt 181 lb 3.2 oz (82.2 kg)    LMP 05/13/2021 Comment: lasted about 14 days   BMI 25.27 kg/m  Physical Exam Constitutional:      Appearance: Normal appearance.  Cardiovascular:     Rate and Rhythm: Normal rate and regular rhythm.  Pulmonary:     Effort: Pulmonary effort is normal.     Breath sounds: Normal breath sounds.  Neurological:     General: No focal deficit present.  Psychiatric:        Mood and Affect: Mood normal.    Assessment/Plan: 1. Fibroids, submucosal - paperwork for Sonata procedure completed and signed - pt would be an excellent candidate for this due to single fibroid and location.  Would like be able to resect fibroid after treatment and in prior patients when this has been done, there has been almost no surgical blood loss which would be excellent for her consider hx of iron deficiency anemia and need for iron infusions  2. Menorrhagia with irregular cycle  3.  H/o Iron deficiency anemia, improved after IV iron

## 2021-06-18 ENCOUNTER — Other Ambulatory Visit (INDEPENDENT_AMBULATORY_CARE_PROVIDER_SITE_OTHER): Payer: Self-pay | Admitting: Adult Health

## 2021-06-18 ENCOUNTER — Other Ambulatory Visit (HOSPITAL_COMMUNITY): Payer: Self-pay

## 2021-06-18 ENCOUNTER — Encounter (INDEPENDENT_AMBULATORY_CARE_PROVIDER_SITE_OTHER): Payer: Self-pay

## 2021-06-18 DIAGNOSIS — E1169 Type 2 diabetes mellitus with other specified complication: Secondary | ICD-10-CM

## 2021-06-18 MED ORDER — ROSUVASTATIN CALCIUM 10 MG PO TABS
ORAL_TABLET | ORAL | 0 refills | Status: DC
  Filled 2021-06-18: qty 8, 28d supply, fill #0

## 2021-06-18 NOTE — Telephone Encounter (Signed)
Msg sent to pt 

## 2021-06-18 NOTE — Telephone Encounter (Signed)
[name]

## 2021-06-19 ENCOUNTER — Encounter (HOSPITAL_BASED_OUTPATIENT_CLINIC_OR_DEPARTMENT_OTHER): Payer: Self-pay

## 2021-06-27 ENCOUNTER — Other Ambulatory Visit: Payer: Self-pay

## 2021-06-27 ENCOUNTER — Telehealth (HOSPITAL_COMMUNITY): Payer: Self-pay

## 2021-06-27 ENCOUNTER — Encounter (HOSPITAL_COMMUNITY): Payer: Self-pay | Admitting: Hematology

## 2021-06-27 ENCOUNTER — Other Ambulatory Visit (HOSPITAL_COMMUNITY): Payer: Self-pay

## 2021-06-27 ENCOUNTER — Other Ambulatory Visit (HOSPITAL_COMMUNITY)
Admission: RE | Admit: 2021-06-27 | Discharge: 2021-06-27 | Disposition: A | Discharge status: Home / Self Care | Payer: No Typology Code available for payment source | Source: Ambulatory Visit | Attending: Physician Assistant | Admitting: Physician Assistant

## 2021-06-27 DIAGNOSIS — D500 Iron deficiency anemia secondary to blood loss (chronic): Secondary | ICD-10-CM | POA: Insufficient documentation

## 2021-06-27 LAB — CBC WITH DIFFERENTIAL/PLATELET
Abs Immature Granulocytes: 0.03 10*3/uL (ref 0.00–0.07)
Basophils Absolute: 0 10*3/uL (ref 0.0–0.1)
Basophils Relative: 0 %
Eosinophils Absolute: 0.1 10*3/uL (ref 0.0–0.5)
Eosinophils Relative: 1 %
HCT: 37.8 % (ref 36.0–46.0)
Hemoglobin: 12.5 g/dL (ref 12.0–15.0)
Immature Granulocytes: 0 %
Lymphocytes Relative: 56 %
Lymphs Abs: 4.2 10*3/uL — ABNORMAL HIGH (ref 0.7–4.0)
MCH: 29.5 pg (ref 26.0–34.0)
MCHC: 33.1 g/dL (ref 30.0–36.0)
MCV: 89.2 fL (ref 80.0–100.0)
Monocytes Absolute: 0.6 10*3/uL (ref 0.1–1.0)
Monocytes Relative: 8 %
Neutro Abs: 2.7 10*3/uL (ref 1.7–7.7)
Neutrophils Relative %: 35 %
Platelets: 257 10*3/uL (ref 150–400)
RBC: 4.24 MIL/uL (ref 3.87–5.11)
RDW: 12.6 % (ref 11.5–15.5)
WBC: 7.7 10*3/uL (ref 4.0–10.5)
nRBC: 0 % (ref 0.0–0.2)

## 2021-06-27 LAB — IRON AND TIBC
Iron: 42 ug/dL (ref 28–170)
Saturation Ratios: 12 % (ref 10.4–31.8)
TIBC: 349 ug/dL (ref 250–450)
UIBC: 307 ug/dL

## 2021-06-27 LAB — FERRITIN: Ferritin: 55 ng/mL (ref 11–307)

## 2021-06-27 NOTE — Telephone Encounter (Signed)
Called the patient and is coming today for labs.  Orders printed and taken to the front desk. Patient aware.

## 2021-06-27 NOTE — Telephone Encounter (Signed)
Patient called stating she is having increased fatigue and some lightheadedness.  Denied SOB or any other complaints.  She has a lab and follow up in February but would like to come sooner.  Instructed the patient her provider would be contacted and we would follow up with her for a new appointment.

## 2021-06-27 NOTE — Telephone Encounter (Signed)
Please put in a CBC, ferritin, and iron/TIBC under my name.  If lab can get her in today, go ahead and have them drawn this afternoon.  I will review results, and depending on what her levels show, will decide on whether or not we need to move her appointment up or initiate IV iron treatment.  Thanks!

## 2021-06-30 ENCOUNTER — Other Ambulatory Visit (HOSPITAL_COMMUNITY): Payer: Self-pay | Admitting: Physician Assistant

## 2021-06-30 NOTE — Progress Notes (Signed)
Patient called and labs reviewed.  Advised of need for iron and to expect a call from scheduling to set up.  Verbalized understanding.

## 2021-07-04 ENCOUNTER — Other Ambulatory Visit: Payer: Self-pay

## 2021-07-04 ENCOUNTER — Inpatient Hospital Stay (HOSPITAL_COMMUNITY): Payer: No Typology Code available for payment source | Attending: Hematology

## 2021-07-04 VITALS — BP 120/73 | HR 64 | Temp 98.1°F | Resp 18

## 2021-07-04 DIAGNOSIS — N920 Excessive and frequent menstruation with regular cycle: Secondary | ICD-10-CM | POA: Diagnosis not present

## 2021-07-04 DIAGNOSIS — D500 Iron deficiency anemia secondary to blood loss (chronic): Secondary | ICD-10-CM | POA: Diagnosis present

## 2021-07-04 MED ORDER — ACETAMINOPHEN 325 MG PO TABS
650.0000 mg | ORAL_TABLET | Freq: Once | ORAL | Status: AC
  Administered 2021-07-04: 650 mg via ORAL
  Filled 2021-07-04: qty 2

## 2021-07-04 MED ORDER — SODIUM CHLORIDE 0.9 % IV SOLN
300.0000 mg | Freq: Once | INTRAVENOUS | Status: AC
  Administered 2021-07-04: 300 mg via INTRAVENOUS
  Filled 2021-07-04: qty 300

## 2021-07-04 MED ORDER — LORATADINE 10 MG PO TABS: 10.0000 mg | ORAL_TABLET | Freq: Once | ORAL | Status: DC

## 2021-07-04 MED ORDER — SODIUM CHLORIDE 0.9 % IV SOLN: Freq: Once | INTRAVENOUS | Status: AC

## 2021-07-04 MED ORDER — DIPHENHYDRAMINE HCL 25 MG PO CAPS
50.0000 mg | ORAL_CAPSULE | Freq: Once | ORAL | Status: AC
  Administered 2021-07-04: 50 mg via ORAL
  Filled 2021-07-04: qty 2

## 2021-07-04 NOTE — Patient Instructions (Signed)
[facility]  Discharge Instructions: Thank you for choosing [facility] to provide your oncology and hematology care.  If you have a lab appointment with the Cancer Center, please come in thru the Main Entrance and check in at the main information desk.  Wear comfortable clothing and clothing appropriate for easy access to any Portacath or PICC line.   We strive to give you quality time with your provider. You may need to reschedule your appointment if you arrive late (15 or more minutes).  Arriving late affects you and other patients whose appointments are after yours.  Also, if you miss three or more appointments without notifying the office, you may be dismissed from the clinic at the provider's discretion.      For prescription refill requests, have your pharmacy contact our office and allow 72 hours for refills to be completed.    Today you received the following chemotherapy and/or immunotherapy agents Venofer      To help prevent nausea and vomiting after your treatment, we encourage you to take your nausea medication as directed.  BELOW ARE SYMPTOMS THAT SHOULD BE REPORTED IMMEDIATELY: *FEVER GREATER THAN 100.4 F (38 C) OR HIGHER *CHILLS OR SWEATING *NAUSEA AND VOMITING THAT IS NOT CONTROLLED WITH YOUR NAUSEA MEDICATION *UNUSUAL SHORTNESS OF BREATH *UNUSUAL BRUISING OR BLEEDING *URINARY PROBLEMS (pain or burning when urinating, or frequent urination) *BOWEL PROBLEMS (unusual diarrhea, constipation, pain near the anus) TENDERNESS IN MOUTH AND THROAT WITH OR WITHOUT PRESENCE OF ULCERS (sore throat, sores in mouth, or a toothache) UNUSUAL RASH, SWELLING OR PAIN  UNUSUAL VAGINAL DISCHARGE OR ITCHING   Items with * indicate a potential emergency and should be followed up as soon as possible or go to the Emergency Department if any problems should occur.  Please show the CHEMOTHERAPY ALERT CARD or IMMUNOTHERAPY ALERT CARD at check-in to the Emergency  Department and triage nurse.  Should you have questions after your visit or need to cancel or reschedule your appointment, please contact [facility] [phone]  and follow the prompts.  Office hours are 8:00 a.m. to 4:30 p.m. Monday - Friday. Please note that voicemails left after 4:00 p.m. may not be returned until the following business day.  We are closed weekends and major holidays. You have access to a nurse at all times for urgent questions. Please call the main number to the clinic [phone] and follow the prompts.  For any non-urgent questions, you may also contact your provider using MyChart. We now offer e-Visits for anyone 18 and older to request care online for non-urgent symptoms. For details visit [email or web address].   Also download the MyChart app! Go to the app store, search "MyChart", open the app, select [facility], and log in with your MyChart username and password.  Due to Covid, a mask is required upon entering the hospital/clinic. If you do not have a mask, one will be given to you upon arrival. For doctor visits, patients may have 1 support person aged 18 or older with them. For treatment visits, patients cannot have anyone with them due to current Covid guidelines and our immunocompromised population.  

## 2021-07-04 NOTE — Progress Notes (Signed)
Patient presents today for Venofer infusion per providers order.  Vital signs WNL.  Patient has no new complaints at this time.  Peripheral IV started and blood return noted pre and post infusion.  Venofer infusion given today per MD orders.  Stable during infusion without adverse affects.  Vital signs stable.  No complaints at this time.  Discharge from clinic ambulatory in stable condition.  Alert and oriented X 3.  Follow up with [facility] as scheduled.  

## 2021-07-04 NOTE — Progress Notes (Signed)
Chaplain engaged in an initial visit with [name], learning about her healthcare journey, life and family.  Virdie shared about her philosophy in life which is to choose not to worry and to trust God.  She shared about the significant experiences and people in her life that have allowed her to know she will get through any situation.  While Corri has experienced some significant losses and grief in her life she maintains peace and joy through it all.  She voiced that she learned that from her grandmother who was always calm.    Dierra also shared about her support system and family.  Chaplain offered listening, presence and support.     07/04/21 1000  Clinical Encounter Type  Visited With Patient  Visit Type Initial

## 2021-07-08 ENCOUNTER — Ambulatory Visit (INDEPENDENT_AMBULATORY_CARE_PROVIDER_SITE_OTHER): Payer: No Typology Code available for payment source | Admitting: Adult Health

## 2021-07-09 ENCOUNTER — Ambulatory Visit (INDEPENDENT_AMBULATORY_CARE_PROVIDER_SITE_OTHER): Payer: No Typology Code available for payment source | Admitting: Adult Health

## 2021-07-09 ENCOUNTER — Other Ambulatory Visit: Payer: Self-pay

## 2021-07-09 ENCOUNTER — Encounter (INDEPENDENT_AMBULATORY_CARE_PROVIDER_SITE_OTHER): Payer: Self-pay | Admitting: Adult Health

## 2021-07-09 ENCOUNTER — Other Ambulatory Visit (HOSPITAL_COMMUNITY): Payer: Self-pay

## 2021-07-09 VITALS — BP 114/72 | HR 67 | Temp 98.1°F | Ht 71.0 in | Wt 176.0 lb

## 2021-07-09 DIAGNOSIS — Z6824 Body mass index (BMI) 24.0-24.9, adult: Secondary | ICD-10-CM | POA: Diagnosis not present

## 2021-07-09 DIAGNOSIS — Z7985 Long-term (current) use of injectable non-insulin antidiabetic drugs: Secondary | ICD-10-CM

## 2021-07-09 DIAGNOSIS — E6609 Other obesity due to excess calories: Secondary | ICD-10-CM

## 2021-07-09 DIAGNOSIS — E785 Hyperlipidemia, unspecified: Secondary | ICD-10-CM

## 2021-07-09 DIAGNOSIS — E668 Other obesity: Secondary | ICD-10-CM | POA: Diagnosis not present

## 2021-07-09 DIAGNOSIS — E1169 Type 2 diabetes mellitus with other specified complication: Secondary | ICD-10-CM | POA: Diagnosis not present

## 2021-07-09 DIAGNOSIS — Z9189 Other specified personal risk factors, not elsewhere classified: Secondary | ICD-10-CM | POA: Diagnosis not present

## 2021-07-09 MED ORDER — SEMAGLUTIDE (1 MG/DOSE) 4 MG/3ML ~~LOC~~ SOPN
1.0000 mg | PEN_INJECTOR | SUBCUTANEOUS | 0 refills | Status: DC
  Filled 2021-07-09: qty 9, 84d supply, fill #0

## 2021-07-09 MED ORDER — ROSUVASTATIN CALCIUM 10 MG PO TABS
ORAL_TABLET | ORAL | 0 refills | Status: DC
  Filled 2021-07-09: qty 8, 28d supply, fill #0

## 2021-07-09 NOTE — Progress Notes (Signed)
Chief Complaint:   OBESITY [name] is here to discuss her progress with her obesity treatment plan along with follow-up of her obesity related diagnoses. [name] is on the Category 3 Plan and states she is following her eating plan approximately 90% of the time. Salah states she is not exercising regularly at this time.  Today's visit was #: 86 Starting weight: 205 lbs Starting date: 08/22/2020 Today's weight: 176 lbs Today's date: 07/09/2021 Total lbs lost to date: 29 lbs Total lbs lost since last in-office visit: 4 lbs  Interim History: [name] has restarted iron infusions - has had 1 treatment, will have 2 more treatments. She experienced constipation after her first infusion. She reports improved energy level since beginning infusion therapy.  Subjective:   1. Hyperlipidemia associated with type 2 diabetes mellitus ([place]) She restarted Crestor 10 mg twice weekly on 05/26/2021. She denies myalgia's.  2. Type 2 diabetes mellitus with other specified complication, without long-term current use of insulin (HCC) She is on Ozempic 1 mg once weekly.   She denies mass in neck, dysphagia, dyspepsia, persistent hoarseness or GI upset. On 04/15/2021, A1c was 5.0.  3. At risk for heart disease [name] is at a higher than average risk for cardiovascular disease due to hyperlipidemia and T2D.   Assessment/Plan:   1. Hyperlipidemia associated with type 2 diabetes mellitus (HCC) Refill Crestor 10 mg twice weekly. Check labs today.  - Refill rosuvastatin (CRESTOR) 10 MG tablet; Take one tablet by mouth twice weekly (Wed/Sun)  Dispense: 8 tablet; Refill: 0 - Comprehensive metabolic panel - Lipid panel  2. Type 2 diabetes mellitus with other specified complication, without long-term current use of insulin (HCC) Check labs today. Refill Ozempic 1 mg once weekly.  - Refill Semaglutide, 1 MG/DOSE, 4 MG/3ML SOPN; Inject 1 mg into the skin once a week.  Dispense: 9 mL; Refill: 0 -  Hemoglobin A1c - Insulin, random  3. At risk for heart disease [name] was given approximately 15 minutes of coronary artery disease prevention counseling today. She is 51 y.o. female and has risk factors for heart disease including obesity. We discussed intensive lifestyle modifications today with an emphasis on specific weight loss instructions and strategies.   Repetitive spaced learning was employed today to elicit superior memory formation and behavioral change.  4. Class 1 obesity due to excess calories with serious comorbidity and body mass index (BMI) of 30.0 to 30.9 in adult, with current BMI 24.6  [name] is currently in the action stage of change. As such, her goal is to continue with weight loss efforts. She has agreed to the Category 3 Plan.   Exercise goals:  As is.  Behavioral modification strategies: increasing lean protein intake, decreasing simple carbohydrates, meal planning and cooking strategies, keeping healthy foods in the home, and planning for success.  [name] has agreed to follow-up with our clinic in 6 weeks. She was informed of the importance of frequent follow-up visits to maximize her success with intensive lifestyle modifications for her multiple health conditions.   [name] was informed we would discuss her lab results at her next visit unless there is a critical issue that needs to be addressed sooner. [name] agreed to keep her next visit at the agreed upon time to discuss these results.  Objective:   Blood pressure 114/72, pulse 67, temperature 98.1 F (36.7 C), height 5\' 11"  (1.803 m), weight 176 lb (79.8 kg), SpO2 100 %. Body mass index is 24.55 kg/m.  General: Cooperative, alert, well  developed, in no acute distress. HEENT: Conjunctivae and lids unremarkable. Cardiovascular: Regular rhythm.  Lungs: Normal work of breathing. Neurologic: No focal deficits.   Lab Results  Component Value Date   CREATININE 0.91 04/15/2021   BUN 12 04/15/2021   NA 138  04/15/2021   K 4.2 04/15/2021   CL 103 04/15/2021   CO2 23 04/15/2021   Lab Results  Component Value Date   ALT 6 04/15/2021   AST 10 04/15/2021   ALKPHOS 43 (L) 04/15/2021   BILITOT <0.2 04/15/2021   Lab Results  Component Value Date   HGBA1C 5.0 04/15/2021   HGBA1C 5.8 (H) 08/22/2020   HGBA1C 5.4 03/15/2019   HGBA1C 5.4 08/23/2017   HGBA1C 5.3 08/22/2016   Lab Results  Component Value Date   INSULIN 19.2 04/15/2021   INSULIN 7.8 08/22/2020   Lab Results  Component Value Date   TSH 1.250 08/22/2020   Lab Results  Component Value Date   CHOL 216 (H) 04/15/2021   HDL 71 04/15/2021   LDLCALC 124 (H) 04/15/2021   TRIG 119 04/15/2021   CHOLHDL 3.0 04/15/2021   Lab Results  Component Value Date   VD25OH 29.2 (L) 04/15/2021   VD25OH 38.5 08/22/2020   VD25OH 30.4 12/07/2019   Lab Results  Component Value Date   WBC 7.7 06/27/2021   HGB 12.5 06/27/2021   HCT 37.8 06/27/2021   MCV 89.2 06/27/2021   PLT 257 06/27/2021   Lab Results  Component Value Date   IRON 42 06/27/2021   TIBC 349 06/27/2021   FERRITIN 55 06/27/2021   Attestation Statements:   Reviewed by clinician on day of visit: allergies, medications, problem list, medical history, surgical history, family history, social history, and previous encounter notes.  I, Water quality scientist, CMA, am acting as Location manager for Mina Marble, NP.  I have reviewed the above documentation for accuracy and completeness, and I agree with the above. -  Fawnda Vitullo d. Oluwatomisin Deman, NP-C

## 2021-07-10 ENCOUNTER — Other Ambulatory Visit (HOSPITAL_COMMUNITY): Payer: Self-pay

## 2021-07-10 LAB — COMPREHENSIVE METABOLIC PANEL
ALT: 10 IU/L (ref 0–32)
AST: 14 IU/L (ref 0–40)
Albumin/Globulin Ratio: 1.5 (ref 1.2–2.2)
Albumin: 4.7 g/dL (ref 3.8–4.9)
Alkaline Phosphatase: 47 IU/L (ref 44–121)
BUN/Creatinine Ratio: 15 (ref 9–23)
BUN: 13 mg/dL (ref 6–24)
Bilirubin Total: <0.2 mg/dL (ref 0.0–1.2)
CO2: 23 mmol/L (ref 20–29)
Calcium: 9.6 mg/dL (ref 8.7–10.2)
Chloride: 100 mmol/L (ref 96–106)
Creatinine, Ser: 0.85 mg/dL (ref 0.57–1.00)
Globulin, Total: 3.2 g/dL (ref 1.5–4.5)
Glucose: 62 mg/dL — ABNORMAL LOW (ref 70–99)
Potassium: 4.2 mmol/L (ref 3.5–5.2)
Sodium: 140 mmol/L (ref 134–144)
Total Protein: 7.9 g/dL (ref 6.0–8.5)
eGFR: 83 mL/min/{1.73_m2} (ref >59)

## 2021-07-10 LAB — LIPID PANEL
Chol/HDL Ratio: 2.5 ratio (ref 0.0–4.4)
Cholesterol, Total: 163 mg/dL (ref 100–199)
HDL: 64 mg/dL (ref >39)
LDL Chol Calc (NIH): 80 mg/dL (ref 0–99)
Triglycerides: 104 mg/dL (ref 0–149)
VLDL Cholesterol Cal: 19 mg/dL (ref 5–40)

## 2021-07-10 LAB — INSULIN, RANDOM: INSULIN: 8.6 u[IU]/mL (ref 2.6–24.9)

## 2021-07-10 LAB — HEMOGLOBIN A1C
Est. average glucose Bld gHb Est-mCnc: 103 mg/dL
Hgb A1c MFr Bld: 5.2 % (ref 4.8–5.6)

## 2021-07-11 ENCOUNTER — Inpatient Hospital Stay (HOSPITAL_COMMUNITY): Payer: No Typology Code available for payment source

## 2021-07-11 ENCOUNTER — Encounter (HOSPITAL_COMMUNITY): Payer: Self-pay

## 2021-07-11 ENCOUNTER — Other Ambulatory Visit: Payer: Self-pay

## 2021-07-11 VITALS — BP 102/67 | HR 63 | Temp 98.2°F | Resp 18

## 2021-07-11 DIAGNOSIS — N920 Excessive and frequent menstruation with regular cycle: Secondary | ICD-10-CM | POA: Diagnosis not present

## 2021-07-11 DIAGNOSIS — D500 Iron deficiency anemia secondary to blood loss (chronic): Secondary | ICD-10-CM

## 2021-07-11 MED ORDER — SODIUM CHLORIDE 0.9 % IV SOLN
300.0000 mg | Freq: Once | INTRAVENOUS | Status: AC
  Administered 2021-07-11: 300 mg via INTRAVENOUS
  Filled 2021-07-11: qty 300

## 2021-07-11 MED ORDER — ACETAMINOPHEN 325 MG PO TABS
650.0000 mg | ORAL_TABLET | Freq: Once | ORAL | Status: AC
  Administered 2021-07-11: 650 mg via ORAL
  Filled 2021-07-11: qty 2

## 2021-07-11 MED ORDER — SODIUM CHLORIDE 0.9 % IV SOLN: Freq: Once | INTRAVENOUS | Status: AC

## 2021-07-11 MED ORDER — DIPHENHYDRAMINE HCL 25 MG PO CAPS
50.0000 mg | ORAL_CAPSULE | Freq: Once | ORAL | Status: AC
  Administered 2021-07-11: 50 mg via ORAL
  Filled 2021-07-11: qty 2

## 2021-07-11 MED ORDER — SODIUM CHLORIDE 0.9% FLUSH: 10.0000 mL | Freq: Once | INTRAVENOUS | Status: DC | PRN

## 2021-07-11 NOTE — Patient Instructions (Signed)
[facility]  Discharge Instructions: Thank you for choosing [facility] to provide your oncology and hematology care.  If you have a lab appointment with the Cancer Center, please come in thru the Main Entrance and check in at the main information desk.  Wear comfortable clothing and clothing appropriate for easy access to any Portacath or PICC line.   We strive to give you quality time with your provider. You may need to reschedule your appointment if you arrive late (15 or more minutes).  Arriving late affects you and other patients whose appointments are after yours.  Also, if you miss three or more appointments without notifying the office, you may be dismissed from the clinic at the provider's discretion.      For prescription refill requests, have your pharmacy contact our office and allow 72 hours for refills to be completed.        To help prevent nausea and vomiting after your treatment, we encourage you to take your nausea medication as directed.  BELOW ARE SYMPTOMS THAT SHOULD BE REPORTED IMMEDIATELY: *FEVER GREATER THAN 100.4 F (38 C) OR HIGHER *CHILLS OR SWEATING *NAUSEA AND VOMITING THAT IS NOT CONTROLLED WITH YOUR NAUSEA MEDICATION *UNUSUAL SHORTNESS OF BREATH *UNUSUAL BRUISING OR BLEEDING *URINARY PROBLEMS (pain or burning when urinating, or frequent urination) *BOWEL PROBLEMS (unusual diarrhea, constipation, pain near the anus) TENDERNESS IN MOUTH AND THROAT WITH OR WITHOUT PRESENCE OF ULCERS (sore throat, sores in mouth, or a toothache) UNUSUAL RASH, SWELLING OR PAIN  UNUSUAL VAGINAL DISCHARGE OR ITCHING   Items with * indicate a potential emergency and should be followed up as soon as possible or go to the Emergency Department if any problems should occur.  Please show the CHEMOTHERAPY ALERT CARD or IMMUNOTHERAPY ALERT CARD at check-in to the Emergency Department and triage nurse.  Should you have questions after your visit or need to cancel  or reschedule your appointment, please contact [facility] [phone]  and follow the prompts.  Office hours are 8:00 a.m. to 4:30 p.m. Monday - Friday. Please note that voicemails left after 4:00 p.m. may not be returned until the following business day.  We are closed weekends and major holidays. You have access to a nurse at all times for urgent questions. Please call the main number to the clinic [phone] and follow the prompts.  For any non-urgent questions, you may also contact your provider using MyChart. We now offer e-Visits for anyone 18 and older to request care online for non-urgent symptoms. For details visit [email or web address].   Also download the MyChart app! Go to the app store, search "MyChart", open the app, select [facility], and log in with your MyChart username and password.  Due to Covid, a mask is required upon entering the hospital/clinic. If you do not have a mask, one will be given to you upon arrival. For doctor visits, patients may have 1 support person aged 18 or older with them. For treatment visits, patients cannot have anyone with them due to current Covid guidelines and our immunocompromised population.  

## 2021-07-11 NOTE — Progress Notes (Signed)
Patient tolerated iron infusion with no complaints voiced.  Peripheral IV site clean and dry with good blood return noted before and after infusion.  Band aid applied.  VSS with discharge and left in satisfactory condition with no s/s of distress noted.   

## 2021-07-11 NOTE — Progress Notes (Signed)
Chaplain engaged in a follow-up visit with [name].  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  She has noticed a difference in her energy in receiving treatments.  Chaplain offered support, listening and presence.  Iriana has appreciated Chaplain coming by.    07/11/21 1000  Clinical Encounter Type  Visited With Patient  Visit Type Follow-up

## 2021-07-14 ENCOUNTER — Encounter: Payer: Self-pay | Admitting: Orthopedic Surgery

## 2021-07-14 ENCOUNTER — Ambulatory Visit (INDEPENDENT_AMBULATORY_CARE_PROVIDER_SITE_OTHER): Payer: No Typology Code available for payment source | Admitting: Orthopedic Surgery

## 2021-07-14 ENCOUNTER — Other Ambulatory Visit: Payer: Self-pay

## 2021-07-14 DIAGNOSIS — M7541 Impingement syndrome of right shoulder: Secondary | ICD-10-CM | POA: Diagnosis not present

## 2021-07-14 NOTE — Patient Instructions (Signed)
Work note for restrictions (she will tell you )

## 2021-07-14 NOTE — Progress Notes (Signed)
Chief Complaint  Patient presents with   Injections    Wants injection right shoulder / states has gotten worse since pushing cart   Encounter Diagnosis  Name Primary?   Shoulder impingement, right Yes     Procedure note the subacromial injection shoulder RIGHT    Verbal consent was obtained to inject the  RIGHT   Shoulder  Timeout was completed to confirm the injection site is a subacromial space of the  RIGHT  shoulder   Medication used Depo-Medrol 40 mg and lidocaine 1% 3 cc  Anesthesia was provided by ethyl chloride  The injection was performed in the RIGHT  posterior subacromial space. After pinning the skin with alcohol and anesthetized the skin with ethyl chloride the subacromial space was injected using a 20-gauge needle. There were no complications  Sterile dressing was applied.

## 2021-07-18 ENCOUNTER — Other Ambulatory Visit: Payer: Self-pay

## 2021-07-18 ENCOUNTER — Inpatient Hospital Stay (HOSPITAL_COMMUNITY): Payer: No Typology Code available for payment source

## 2021-07-18 VITALS — BP 113/73 | HR 67 | Temp 97.8°F | Resp 18

## 2021-07-18 DIAGNOSIS — N920 Excessive and frequent menstruation with regular cycle: Secondary | ICD-10-CM | POA: Diagnosis not present

## 2021-07-18 DIAGNOSIS — D500 Iron deficiency anemia secondary to blood loss (chronic): Secondary | ICD-10-CM

## 2021-07-18 MED ORDER — SODIUM CHLORIDE 0.9 % IV SOLN: Freq: Once | INTRAVENOUS | Status: AC

## 2021-07-18 MED ORDER — SODIUM CHLORIDE 0.9 % IV SOLN
300.0000 mg | Freq: Once | INTRAVENOUS | Status: AC
  Administered 2021-07-18: 300 mg via INTRAVENOUS
  Filled 2021-07-18: qty 300

## 2021-07-18 MED ORDER — DIPHENHYDRAMINE HCL 25 MG PO CAPS
50.0000 mg | ORAL_CAPSULE | Freq: Once | ORAL | Status: AC
  Administered 2021-07-18: 50 mg via ORAL
  Filled 2021-07-18: qty 2

## 2021-07-18 MED ORDER — ACETAMINOPHEN 325 MG PO TABS
650.0000 mg | ORAL_TABLET | Freq: Once | ORAL | Status: AC
  Administered 2021-07-18: 650 mg via ORAL
  Filled 2021-07-18: qty 2

## 2021-07-18 NOTE — Progress Notes (Signed)
Patient presents today for iron infusion.  Patient is in satisfactory condition with no complaints voiced.  Vital signs are stable.  We will proceed with treatment per MD orders.  Patient tolerated treatment well with no complaints voiced.  Patient left ambulatory in stable condition.  Vital signs stable at discharge.  Follow up as scheduled.    

## 2021-07-18 NOTE — Patient Instructions (Signed)
[facility]  Discharge Instructions: Thank you for choosing [facility] to provide your oncology and hematology care.  If you have a lab appointment with the Cancer Center, please come in thru the Main Entrance and check in at the main information desk.  Wear comfortable clothing and clothing appropriate for easy access to any Portacath or PICC line.   We strive to give you quality time with your provider. You may need to reschedule your appointment if you arrive late (15 or more minutes).  Arriving late affects you and other patients whose appointments are after yours.  Also, if you miss three or more appointments without notifying the office, you may be dismissed from the clinic at the provider's discretion.      For prescription refill requests, have your pharmacy contact our office and allow 72 hours for refills to be completed.        To help prevent nausea and vomiting after your treatment, we encourage you to take your nausea medication as directed.  BELOW ARE SYMPTOMS THAT SHOULD BE REPORTED IMMEDIATELY: *FEVER GREATER THAN 100.4 F (38 C) OR HIGHER *CHILLS OR SWEATING *NAUSEA AND VOMITING THAT IS NOT CONTROLLED WITH YOUR NAUSEA MEDICATION *UNUSUAL SHORTNESS OF BREATH *UNUSUAL BRUISING OR BLEEDING *URINARY PROBLEMS (pain or burning when urinating, or frequent urination) *BOWEL PROBLEMS (unusual diarrhea, constipation, pain near the anus) TENDERNESS IN MOUTH AND THROAT WITH OR WITHOUT PRESENCE OF ULCERS (sore throat, sores in mouth, or a toothache) UNUSUAL RASH, SWELLING OR PAIN  UNUSUAL VAGINAL DISCHARGE OR ITCHING   Items with * indicate a potential emergency and should be followed up as soon as possible or go to the Emergency Department if any problems should occur.  Please show the CHEMOTHERAPY ALERT CARD or IMMUNOTHERAPY ALERT CARD at check-in to the Emergency Department and triage nurse.  Should you have questions after your visit or need to cancel  or reschedule your appointment, please contact  CANCER CENTER [phone]  and follow the prompts.  Office hours are 8:00 a.m. to 4:30 p.m. Monday - Friday. Please note that voicemails left after 4:00 p.m. may not be returned until the following business day.  We are closed weekends and major holidays. You have access to a nurse at all times for urgent questions. Please call the main number to the clinic [phone] and follow the prompts.  For any non-urgent questions, you may also contact your provider using MyChart. We now offer e-Visits for anyone 18 and older to request care online for non-urgent symptoms. For details visit [email or web address].   Also download the MyChart app! Go to the app store, search "MyChart", open the app, select , and log in with your MyChart username and password.  Due to Covid, a mask is required upon entering the hospital/clinic. If you do not have a mask, one will be given to you upon arrival. For doctor visits, patients may have 1 support person aged 18 or older with them. For treatment visits, patients cannot have anyone with them due to current Covid guidelines and our immunocompromised population.  

## 2021-07-21 ENCOUNTER — Other Ambulatory Visit (HOSPITAL_COMMUNITY): Payer: Self-pay

## 2021-07-30 ENCOUNTER — Other Ambulatory Visit: Payer: Self-pay

## 2021-07-30 ENCOUNTER — Inpatient Hospital Stay (HOSPITAL_COMMUNITY): Payer: No Typology Code available for payment source | Attending: Hematology

## 2021-07-30 DIAGNOSIS — Z8679 Personal history of other diseases of the circulatory system: Secondary | ICD-10-CM | POA: Diagnosis not present

## 2021-07-30 DIAGNOSIS — Z8349 Family history of other endocrine, nutritional and metabolic diseases: Secondary | ICD-10-CM | POA: Insufficient documentation

## 2021-07-30 DIAGNOSIS — Z79899 Other long term (current) drug therapy: Secondary | ICD-10-CM | POA: Insufficient documentation

## 2021-07-30 DIAGNOSIS — Z833 Family history of diabetes mellitus: Secondary | ICD-10-CM | POA: Diagnosis not present

## 2021-07-30 DIAGNOSIS — N939 Abnormal uterine and vaginal bleeding, unspecified: Secondary | ICD-10-CM | POA: Insufficient documentation

## 2021-07-30 DIAGNOSIS — I10 Essential (primary) hypertension: Secondary | ICD-10-CM | POA: Insufficient documentation

## 2021-07-30 DIAGNOSIS — M2550 Pain in unspecified joint: Secondary | ICD-10-CM | POA: Diagnosis not present

## 2021-07-30 DIAGNOSIS — Z8049 Family history of malignant neoplasm of other genital organs: Secondary | ICD-10-CM | POA: Insufficient documentation

## 2021-07-30 DIAGNOSIS — Z885 Allergy status to narcotic agent status: Secondary | ICD-10-CM | POA: Insufficient documentation

## 2021-07-30 DIAGNOSIS — Z8042 Family history of malignant neoplasm of prostate: Secondary | ICD-10-CM | POA: Diagnosis not present

## 2021-07-30 DIAGNOSIS — R5383 Other fatigue: Secondary | ICD-10-CM | POA: Diagnosis not present

## 2021-07-30 DIAGNOSIS — E119 Type 2 diabetes mellitus without complications: Secondary | ICD-10-CM | POA: Insufficient documentation

## 2021-07-30 DIAGNOSIS — Z87891 Personal history of nicotine dependence: Secondary | ICD-10-CM | POA: Diagnosis not present

## 2021-07-30 DIAGNOSIS — E669 Obesity, unspecified: Secondary | ICD-10-CM | POA: Insufficient documentation

## 2021-07-30 DIAGNOSIS — D500 Iron deficiency anemia secondary to blood loss (chronic): Secondary | ICD-10-CM | POA: Insufficient documentation

## 2021-07-30 DIAGNOSIS — Z8249 Family history of ischemic heart disease and other diseases of the circulatory system: Secondary | ICD-10-CM | POA: Insufficient documentation

## 2021-07-30 DIAGNOSIS — Z841 Family history of disorders of kidney and ureter: Secondary | ICD-10-CM | POA: Insufficient documentation

## 2021-07-30 DIAGNOSIS — N920 Excessive and frequent menstruation with regular cycle: Secondary | ICD-10-CM | POA: Diagnosis not present

## 2021-07-30 DIAGNOSIS — D259 Leiomyoma of uterus, unspecified: Secondary | ICD-10-CM | POA: Insufficient documentation

## 2021-07-30 LAB — CBC WITH DIFFERENTIAL/PLATELET
Abs Immature Granulocytes: 0.02 10*3/uL (ref 0.00–0.07)
Basophils Absolute: 0 10*3/uL (ref 0.0–0.1)
Basophils Relative: 1 %
Eosinophils Absolute: 0.1 10*3/uL (ref 0.0–0.5)
Eosinophils Relative: 1 %
HCT: 39.9 % (ref 36.0–46.0)
Hemoglobin: 13.3 g/dL (ref 12.0–15.0)
Immature Granulocytes: 0 %
Lymphocytes Relative: 56 %
Lymphs Abs: 4.4 10*3/uL — ABNORMAL HIGH (ref 0.7–4.0)
MCH: 29.8 pg (ref 26.0–34.0)
MCHC: 33.3 g/dL (ref 30.0–36.0)
MCV: 89.5 fL (ref 80.0–100.0)
Monocytes Absolute: 0.6 10*3/uL (ref 0.1–1.0)
Monocytes Relative: 7 %
Neutro Abs: 2.8 10*3/uL (ref 1.7–7.7)
Neutrophils Relative %: 35 %
Platelets: 239 10*3/uL (ref 150–400)
RBC: 4.46 MIL/uL (ref 3.87–5.11)
RDW: 13.6 % (ref 11.5–15.5)
WBC: 8 10*3/uL (ref 4.0–10.5)
nRBC: 0 % (ref 0.0–0.2)

## 2021-07-30 LAB — IRON AND TIBC
Iron: 56 ug/dL (ref 28–170)
Saturation Ratios: 17 % (ref 10.4–31.8)
TIBC: 329 ug/dL (ref 250–450)
UIBC: 273 ug/dL

## 2021-07-30 LAB — FERRITIN: Ferritin: 273 ng/mL (ref 11–307)

## 2021-07-30 NOTE — Progress Notes (Signed)
Will be discussed at 2/13

## 2021-08-05 ENCOUNTER — Encounter (HOSPITAL_BASED_OUTPATIENT_CLINIC_OR_DEPARTMENT_OTHER): Payer: Self-pay

## 2021-08-05 NOTE — Progress Notes (Signed)
[place], [place] [address]   CLINIC:  Medical Oncology/Hematology  PCP:  Fayrene Helper, MD [address], [address] [place] [address] [phone]   REASON FOR VISIT:  Follow-up for iron deficiency anemia   CURRENT THERAPY: Intermittent IV iron infusions (last Venofer 07/18/2021)   INTERVAL HISTORY:  [name] 51 y.o. female returns for routine follow-up of iron deficiency anemia.  She was last seen by Tarri Abernethy PA-C on 05/06/2021.  Her last IV iron infusion was on 07/18/2021.    At today's visit, she reports feeling patient had called the clinic in January 2023 due to symptoms of extreme fatigue and "brain fog."  She was found to have decreased iron levels, and was treated with IV Venofer.  She does report that she felt better after that IV iron.  However, she had heavy menstrual bleeding that started again at the end of January 2023, and lasted for 15 days with large "fist-sized" clots being passed.  Since that time, she has had some recurrent fatigue and mental sluggishness.  She denies any other sources of bleeding such as bright red blood per rectum or melena.  No pica, restless legs, headaches, chest pain, dyspnea on exertion, or syncope.  She is scheduled for uterine ablation ([place] procedure) on 08/19/2021.  Regarding her persistent lymphocytosis, she has not noticed any recent fever, chills, night sweats, unintentional weight loss, lymphadenopathy, or infections.  She has 50% energy and 100% appetite. She endorses that she is maintaining a stable weight.   REVIEW OF SYSTEMS:  Review of Systems  Constitutional:  Positive for fatigue. Negative for appetite change, chills, diaphoresis, fever and unexpected weight change.  HENT:   Negative for lump/mass and nosebleeds.   Eyes:  Negative for eye problems.  Respiratory:  Negative for cough, hemoptysis and shortness of breath.   Cardiovascular:  Negative for chest  pain, leg swelling and palpitations.  Gastrointestinal:  Negative for abdominal pain, blood in stool, constipation, diarrhea, nausea and vomiting.  Genitourinary:  Positive for menstrual problem and vaginal bleeding. Negative for hematuria.   Musculoskeletal:  Positive for arthralgias.  Skin: Negative.   Neurological:  Negative for dizziness, headaches and light-headedness.  Hematological:  Does not bruise/bleed easily.     PAST MEDICAL/SURGICAL HISTORY:  Past Medical History:  Diagnosis Date   Arthritis    Phreesia 03/29/2020   Back pain    DDD (degenerative disc disease), lumbar    Diabetes mellitus without complication ([place])    Phreesia 03/29/2020   Essential hypertension, benign    GERD (gastroesophageal reflux disease)    Hypertension    Phreesia 03/29/2020   Joint pain    Lactose intolerance    Lumbar herniated disc    Migraine headache 2007   Obesity    Since childhood    Osteoarthritis    Pneumonia 2007   Sacroiliac joint disease    Spinal disease    Supraventricular tachycardia [facility])    Status post RFA July 2015 - Dr. Lovena Le   Type 2 diabetes mellitus ([place])    Varicose vein of leg    Vitamin D deficiency    Weight loss    Hopitalized 4 years ago after loosing 200lb on a supervised program, malnurished with severe protein deficiency    Past Surgical History:  Procedure Laterality Date   ABLATION  12-28-2013   slow pathway modification of AVNRT by Dr Lovena Le   COLONOSCOPY N/A 11/17/2019   Procedure: COLONOSCOPY;  Surgeon: Daneil Dolin, MD;  Location: [facility];  Service: Endoscopy;  Laterality: N/A;  7:30   ELECTROPHYSIOLOGY STUDY N/A 12/28/2013   Procedure: ELECTROPHYSIOLOGY STUDY;  Surgeon: Evans Lance, MD;  Location: [facility] CATH LAB;  Service: Cardiovascular;  Laterality: N/A;   SUPRAVENTRICULAR TACHYCARDIA ABLATION N/A 12/28/2013   Procedure: SUPRAVENTRICULAR TACHYCARDIA ABLATION;  Surgeon: Evans Lance, MD;  Location: [facility] CATH LAB;  Service: Cardiovascular;   Laterality: N/A;   TUBAL LIGATION  1996     SOCIAL HISTORY:  Social History   Socioeconomic History   Marital status: Married    Spouse name: Not on file   Number of children: 2   Years of education: Not on file   Highest education level: Not on file  Occupational History   Occupation: English as a second language teacher at [employer] Use   Smoking status: Former    Packs/day: 0.50    Years: 7.00    Pack years: 3.50    Types: Cigarettes    Quit date: 08/05/2012    Years since quitting: 9.0   Smokeless tobacco: Never   Tobacco comments:    quit in 2014  Vaping Use   Vaping Use: Never used  Substance and Sexual Activity   Alcohol use: No    Alcohol/week: 0.0 standard drinks   Drug use: No   Sexual activity: Yes    Birth control/protection: Surgical    Comment: tubal ligation  Other Topics Concern   Not on file  Social History Narrative   Not on file   Social Determinants of Health   Financial Resource Strain: Low Risk    Difficulty of Paying Living Expenses: Not hard at all  Food Insecurity: No Food Insecurity   Worried About Charity fundraiser in the Last Year: Never true   [place] in the Last Year: Never true  Transportation Needs: No Transportation Needs   Lack of Transportation (Medical): No   Lack of Transportation (Non-Medical): No  Physical Activity: Inactive   Days of Exercise per Week: 0 days   Minutes of Exercise per Session: 0 min  Stress: No Stress Concern Present   Feeling of Stress : Not at all  Social Connections: Moderately Integrated   Frequency of Communication with Friends and Family: More than three times a week   Frequency of Social Gatherings with Friends and Family: Once a week   Attends Religious Services: 1 to 4 times per year   Active Member of [employer] or Organizations: No   Attends Music therapist: Never   Marital Status: Married  Human resources officer Violence: Not At Risk   Fear of Current or Ex-Partner: No   Emotionally Abused: No    Physically Abused: No   Sexually Abused: No    FAMILY HISTORY:  Family History  Problem Relation Age of Onset   Hypertension Mother    Diabetes Mother    Cancer Mother        Uterine    Heart failure Mother        CABG   Hyperlipidemia Mother    Heart disease Mother    Thyroid disease Mother    Cancer Father 42       Prostate    Hypertension Father    Bradycardia Father        Irregular heart beat    Hyperlipidemia Father    Kidney disease Father    Diabetes Brother    Diabetes Brother    Hypertension Brother  Hypertension Brother     CURRENT MEDICATIONS:  Outpatient Encounter Medications as of 08/06/2021  Medication Sig   acetaminophen (TYLENOL) 500 MG tablet Take 1,000 mg by mouth every 6 (six) hours as needed (for pain).   cyclobenzaprine (FLEXERIL) 10 MG tablet TAKE 1 TABLET BY MOUTH AT BEDTIME   Diclofenac-miSOPROStol 50-0.2 MG TBEC TAKE 1 TABLET BY MOUTH TWICE DAILY AFTER A MEAL   famotidine (PEPCID) 40 MG tablet TAKE ONE TABLET ONCE DAILY WHEN YOU TAKE IBUPROFEN   ferrous sulfate 325 (65 FE) MG tablet Take 325 mg by mouth every other day.   HYDROcodone-acetaminophen (NORCO/VICODIN) 5-325 MG tablet Take 1 tablet by mouth every 6 (six) hours as needed for moderate pain.   losartan-hydrochlorothiazide (HYZAAR) 50-12.5 MG tablet TAKE 1 TABLET BY MOUTH ONCE DAILY   rosuvastatin (CRESTOR) 10 MG tablet Take one tablet by mouth twice weekly (Wed/Sun)   Semaglutide, 1 MG/DOSE, 4 MG/3ML SOPN Inject 1 mg into the skin once a week.   Vitamin D, Ergocalciferol, (DRISDOL) 1.25 MG (50000 UNIT) CAPS capsule Take 1 capsule by mouth every 7 days.   No facility-administered encounter medications on file as of 08/06/2021.    ALLERGIES:  Allergies  Allergen Reactions   Codeine Nausea And Vomiting   Metoclopramide Hcl Other (See Comments)    Not in right state of mind     PHYSICAL EXAM:  ECOG PERFORMANCE STATUS: 1 - Symptomatic but completely ambulatory  There were no  vitals filed for this visit. There were no vitals filed for this visit. Physical Exam Constitutional:      Appearance: Normal appearance.  HENT:     Head: Normocephalic and atraumatic.     Mouth/Throat:     Mouth: Mucous membranes are moist.  Eyes:     Extraocular Movements: Extraocular movements intact.     Pupils: Pupils are equal, round, and reactive to light.  Cardiovascular:     Rate and Rhythm: Normal rate and regular rhythm.     Pulses: Normal pulses.     Heart sounds: Normal heart sounds.  Pulmonary:     Effort: Pulmonary effort is normal.     Breath sounds: Normal breath sounds.  Abdominal:     General: Bowel sounds are normal.     Palpations: Abdomen is soft.     Tenderness: There is no abdominal tenderness.  Musculoskeletal:        General: No swelling.     Right lower leg: No edema.     Left lower leg: No edema.  Lymphadenopathy:     Cervical: No cervical adenopathy.  Skin:    General: Skin is warm and dry.  Neurological:     General: No focal deficit present.     Mental Status: She is alert and oriented to person, place, and time.  Psychiatric:        Mood and Affect: Mood normal.        Behavior: Behavior normal.     LABORATORY DATA:  I have reviewed the labs as listed.  CBC    Component Value Date/Time   WBC 8.0 07/30/2021 1012   RBC 4.46 07/30/2021 1012   HGB 13.3 07/30/2021 1012   HGB 9.9 (L) 09/23/2020 1501   HCT 39.9 07/30/2021 1012   HCT 31.9 (L) 09/23/2020 1501   PLT 239 07/30/2021 1012   PLT 281 09/23/2020 1501   MCV 89.5 07/30/2021 1012   MCV 77 (L) 09/23/2020 1501   MCH 29.8 07/30/2021 1012   MCHC 33.3 07/30/2021  1012   RDW 13.6 07/30/2021 1012   RDW 15.8 (H) 09/23/2020 1501   LYMPHSABS 4.4 (H) 07/30/2021 1012   LYMPHSABS 5.0 (H) 08/22/2020 0956   MONOABS 0.6 07/30/2021 1012   EOSABS 0.1 07/30/2021 1012   EOSABS 0.1 08/22/2020 0956   BASOSABS 0.0 07/30/2021 1012   BASOSABS 0.1 08/22/2020 0956   CMP Latest Ref Rng & Units  07/09/2021 04/15/2021 08/22/2020  Glucose 70 - 99 mg/dL 62(L) 78 82  BUN 6 - 24 mg/dL 13 12 12   Creatinine 0.57 - 1.00 mg/dL 0.85 0.91 0.78  Sodium 134 - 144 mmol/L 140 138 136  Potassium 3.5 - 5.2 mmol/L 4.2 4.2 4.0  Chloride 96 - 106 mmol/L 100 103 99  CO2 20 - 29 mmol/L 23 23 20   Calcium 8.7 - 10.2 mg/dL 9.6 9.7 9.2  Total Protein 6.0 - 8.5 g/dL 7.9 7.5 7.5  Total Bilirubin 0.0 - 1.2 mg/dL <0.2 <0.2 <0.2  Alkaline Phos 44 - 121 IU/L 47 43(L) 61  AST 0 - 40 IU/L 14 10 14   ALT 0 - 32 IU/L 10 6 7     DIAGNOSTIC IMAGING:  I have independently reviewed the relevant imaging and discussed with the patient.  ASSESSMENT & PLAN: 1.  Iron deficiency anemia -  Secondary to chronic blood loss (menorrhagia) and malabsorption in the setting of PPI use - Episode of heavy bleeding in October 2022, reportedly bled for about 48 consecutive days.  Continues to have heavy periods lasting 7 to 10 days. - GYN work-up showed 4 cm uterine fibroid, which she is considering having surgically removed  - Failed to improve on oral iron tablets - Has received several rounds of IV Venofer, last given on 07/18/2021 - Hemoccult stool x3 negative, denies melena or bright red blood per rectum   - Work-up for other causes of anemia unremarkable (normal methylmalonic acid, B12, folate, copper, LDH, IFE/SPEP/FLC); CMP (08/22/2020) showed normal creatinine 0.78, normal liver function. - Normal screening colonoscopy in May 2021; no history of EGD - Currently symptomatic with fatigue -  She admits to menorrhagia as above, but denies any other source of blood loss such as bright red blood per rectum or melena.     - Most recent labs (07/30/2021): Hgb 13.3, iron saturation 17%, ferritin 273 - PLAN: We will hold off on any IV iron for the time being. - We will check CBC/iron panel in 2 months (labs only) - We will repeat CBC/iron panel in 4 months along with office visit. - Patient has been advised to call the clinic sooner if she  starts to feel that her iron is low and needs labs checked.   2.  Lymphocytosis - Review of labs shows that she has had mild lymphocytosis since at least March 2022 - Pathology smear review confirmed absolute lymphocytosis - Flow cytometry (03/28/2021) showed predominance of T cells with relative abundance of CD8 positive cells, no monoclonal B-cell population identified.  T-cell changes are not considered specific or diagnostic of lymphoproliferative disorder at this time may be secondary to infection or autoimmune disease, etc.  Clinical correlation and follow-up recommended. - She denies any new lumps or bumps.  No fever, chills, night sweats, unintentional weight loss.   - Most recent CBC (07/30/2021): Absolute lymphocytes remain mildly elevated at 4.4 - PLAN: We will continue to monitor with repeat CBCs and consider repeat flow cytometry in 6 to 12 months (October 2023) if persistent lymphocytosis.   3.  Other history - Her other past  medical history is significant for chronic back pain, history of SVT s/p ablation, hypertension, diet-controlled diabetes mellitus type 2. 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  She is former smoker (10 pack-year history), quit over 10 years ago.  She denies alcohol or drug use. - Patient's mother had uterine caner, father with prostate cancer.  No other known family history of anemia or cancers.   PLAN SUMMARY & DISPOSITION: - We will check CBC/iron panel in 2 months (labs only) - We will repeat CBC/iron panel in 4 months along with office visit.  All questions were answered. The patient knows to call the clinic with any problems, questions or concerns.  Medical decision making: Low  Time spent on visit: I spent 15 minutes counseling the patient face to face. The total time spent in the appointment was 25 minutes and more than 50% was on counseling.   Harriett Rush, PA-C  08/06/2021 10:44 AM

## 2021-08-06 ENCOUNTER — Other Ambulatory Visit: Payer: Self-pay

## 2021-08-06 ENCOUNTER — Inpatient Hospital Stay (HOSPITAL_BASED_OUTPATIENT_CLINIC_OR_DEPARTMENT_OTHER): Payer: No Typology Code available for payment source | Admitting: Physician Assistant

## 2021-08-06 VITALS — BP 107/71 | HR 78 | Temp 97.7°F | Resp 18 | Ht 71.0 in | Wt 175.4 lb

## 2021-08-06 DIAGNOSIS — D500 Iron deficiency anemia secondary to blood loss (chronic): Secondary | ICD-10-CM | POA: Diagnosis not present

## 2021-08-06 DIAGNOSIS — D72820 Lymphocytosis (symptomatic): Secondary | ICD-10-CM | POA: Diagnosis not present

## 2021-08-06 NOTE — Patient Instructions (Signed)
[place] at [facility] Discharge Instructions  You were seen today by Tarri Abernethy PA-C for your IRON DEFICIENCY ANEMIA.  Your blood and iron levels look good right now.    LABS: Return in 2 months for repeat labs.   OTHER TESTS: None  MEDICATIONS: No changes  FOLLOW-UP APPOINTMENT: Labs and office visit in 4 months   Thank you for choosing [place] at [facility] to provide your oncology and hematology care.  To afford each patient quality time with our provider, please arrive at least 15 minutes before your scheduled appointment time.   If you have a lab appointment with the [facility] please come in thru the Main Entrance and check in at the main information desk.  You need to re-schedule your appointment should you arrive 10 or more minutes late.  We strive to give you quality time with our providers, and arriving late affects you and other patients whose appointments are after yours.  Also, if you no show three or more times for appointments you may be dismissed from the clinic at the providers discretion.     Again, thank you for choosing [facility].  Our hope is that these requests will decrease the amount of time that you wait before being seen by our physicians.       _____________________________________________________________  Should you have questions after your visit to [facility], please contact our office at [phone] and follow the prompts.  Our office hours are 8:00 a.m. and 4:30 p.m. Monday - Friday.  Please note that voicemails left after 4:00 p.m. may not be returned until the following business day.  We are closed weekends and major holidays.  You do have access to a nurse 24-7, just call the main number to the clinic [phone] and do not press any options, hold on the line and a nurse will answer the phone.    For prescription refill requests, have your pharmacy contact our  office and allow 72 hours.    Due to Covid, you will need to wear a mask upon entering the hospital. If you do not have a mask, a mask will be given to you at the Main Entrance upon arrival. For doctor visits, patients may have 1 support person age 53 or older with them. For treatment visits, patients can not have anyone with them due to social distancing guidelines and our immunocompromised population.

## 2021-08-11 ENCOUNTER — Other Ambulatory Visit (HOSPITAL_BASED_OUTPATIENT_CLINIC_OR_DEPARTMENT_OTHER): Payer: Self-pay | Admitting: Obstetrics & Gynecology

## 2021-08-11 DIAGNOSIS — D250 Submucous leiomyoma of uterus: Secondary | ICD-10-CM

## 2021-08-11 DIAGNOSIS — N921 Excessive and frequent menstruation with irregular cycle: Secondary | ICD-10-CM

## 2021-08-12 ENCOUNTER — Ambulatory Visit (INDEPENDENT_AMBULATORY_CARE_PROVIDER_SITE_OTHER): Payer: No Typology Code available for payment source | Admitting: Obstetrics & Gynecology

## 2021-08-12 ENCOUNTER — Other Ambulatory Visit: Payer: Self-pay

## 2021-08-12 ENCOUNTER — Encounter (HOSPITAL_BASED_OUTPATIENT_CLINIC_OR_DEPARTMENT_OTHER): Payer: Self-pay | Admitting: Obstetrics & Gynecology

## 2021-08-12 VITALS — BP 107/66 | HR 91 | Ht 71.0 in | Wt 174.4 lb

## 2021-08-12 DIAGNOSIS — D500 Iron deficiency anemia secondary to blood loss (chronic): Secondary | ICD-10-CM

## 2021-08-12 DIAGNOSIS — N921 Excessive and frequent menstruation with irregular cycle: Secondary | ICD-10-CM

## 2021-08-12 DIAGNOSIS — D250 Submucous leiomyoma of uterus: Secondary | ICD-10-CM

## 2021-08-12 NOTE — Pre-Procedure Instructions (Signed)
Surgical Instructions    Your procedure is scheduled on Tuesday 08/19/21.   Report to [facility] Main Entrance "A" at 11:40 A.M., then check in with the Admitting office.  Call this number if you have problems the morning of surgery:  [phone]   If you have any questions prior to your surgery date call [phone]: Open Monday-Friday 8am-4pm    Remember:  Do not eat after midnight the night before your surgery  You may drink clear liquids until 10:40 A.M. the morning of your surgery.   Clear liquids allowed are: Water, Non-Citrus Juices (without pulp), Carbonated Beverages, Clear Tea, Black Coffee Only (NO MILK, CREAM OR POWDERED CREAMER of any kind), and Gatorade.    Take these medicines the morning of surgery with A SIP OF WATER   acetaminophen (TYLENOL)   famotidine (PEPCID)   Take these medicines if needed:   HYDROcodone-acetaminophen (NORCO/VICODIN)  As of today, STOP taking any Aspirin (unless otherwise instructed by your surgeon) Diclofenac-misoprostol, Aleve, Naproxen, Ibuprofen, Motrin, Advil, Goody's, BC's, all herbal medications, fish oil, and all vitamins.  WHAT DO I DO ABOUT MY DIABETES MEDICATION?   Do not take oral diabetes medicines (pills) the morning of surgery.  DO NOT TAKE Semaglutide the morning of surgery.   The day of surgery, do not take other diabetes injectables, including Byetta (exenatide), Bydureon (exenatide ER), Victoza (liraglutide), or Trulicity (dulaglutide).   HOW TO MANAGE YOUR DIABETES BEFORE AND AFTER SURGERY  Why is it important to control my blood sugar before and after surgery? Improving blood sugar levels before and after surgery helps healing and can limit problems. A way of improving blood sugar control is eating a healthy diet by:  Eating less sugar and carbohydrates  Increasing activity/exercise  Talking with your doctor about reaching your blood sugar goals High blood sugars (greater than 180 mg/dL) can raise your risk  of infections and slow your recovery, so you will need to focus on controlling your diabetes during the weeks before surgery. Make sure that the doctor who takes care of your diabetes knows about your planned surgery including the date and location.  How do I manage my blood sugar before surgery? Check your blood sugar at least 4 times a day, starting 2 days before surgery, to make sure that the level is not too high or low.  Check your blood sugar the morning of your surgery when you wake up and every 2 hours until you get to the Short Stay unit.  If your blood sugar is less than 70 mg/dL, you will need to treat for low blood sugar: Do not take insulin. Treat a low blood sugar (less than 70 mg/dL) with  cup of clear juice (cranberry or apple), 4 glucose tablets, OR glucose gel. Recheck blood sugar in 15 minutes after treatment (to make sure it is greater than 70 mg/dL). If your blood sugar is not greater than 70 mg/dL on recheck, call [phone] for further instructions. Report your blood sugar to the short stay nurse when you get to Short Stay.  If you are admitted to the hospital after surgery: Your blood sugar will be checked by the staff and you will probably be given insulin after surgery (instead of oral diabetes medicines) to make sure you have good blood sugar levels. The goal for blood sugar control after surgery is 80-180 mg/dL.                      Do NOT Smoke (  Tobacco/Vaping) for 24 hours prior to your procedure.  If you use a CPAP at night, you may bring your mask/headgear for your overnight stay.   Contacts, glasses, piercing's, hearing aid's, dentures or partials may not be worn into surgery, please bring cases for these belongings.    For patients admitted to the hospital, discharge time will be determined by your treatment team.   Patients discharged the day of surgery will not be allowed to drive home, and someone needs to stay with them for 24 hours.  NO VISITORS  WILL BE ALLOWED IN PRE-OP WHERE PATIENTS ARE PREPPED FOR SURGERY.  ONLY 1 SUPPORT PERSON MAY BE PRESENT IN THE WAITING ROOM WHILE YOU ARE IN SURGERY.  IF YOU ARE TO BE ADMITTED, ONCE YOU ARE IN YOUR ROOM YOU WILL BE ALLOWED TWO (2) VISITORS. (1) VISITOR MAY STAY OVERNIGHT BUT MUST ARRIVE TO THE ROOM BY 8pm.  Minor children may have two parents present. Special consideration for safety and communication needs will be reviewed on a case by case basis.   Special instructions:   [place]- Preparing For Surgery  Before surgery, you can play an important role. Because skin is not sterile, your skin needs to be as free of germs as possible. You can reduce the number of germs on your skin by washing with CHG (chlorahexidine gluconate) Soap before surgery.  CHG is an antiseptic cleaner which kills germs and bonds with the skin to continue killing germs even after washing.    Oral Hygiene is also important to reduce your risk of infection.  Remember - BRUSH YOUR TEETH THE MORNING OF SURGERY WITH YOUR REGULAR TOOTHPASTE  Please do not use if you have an allergy to CHG or antibacterial soaps. If your skin becomes reddened/irritated stop using the CHG.  Do not shave (including legs and underarms) for at least 48 hours prior to first CHG shower. It is OK to shave your face.  Please follow these instructions carefully.   Shower the NIGHT BEFORE SURGERY and the MORNING OF SURGERY  If you chose to wash your hair, wash your hair first as usual with your normal shampoo.  After you shampoo, rinse your hair and body thoroughly to remove the shampoo.  Use CHG Soap as you would any other liquid soap. You can apply CHG directly to the skin and wash gently with a scrungie or a clean washcloth.   Apply the CHG Soap to your body ONLY FROM THE NECK DOWN.  Do not use on open wounds or open sores. Avoid contact with your eyes, ears, mouth and genitals (private parts). Wash Face and genitals (private parts)  with your  normal soap.   Wash thoroughly, paying special attention to the area where your surgery will be performed.  Thoroughly rinse your body with warm water from the neck down.  DO NOT shower/wash with your normal soap after using and rinsing off the CHG Soap.  Pat yourself dry with a CLEAN TOWEL.  Wear CLEAN PAJAMAS to bed the night before surgery  Place CLEAN SHEETS on your bed the night before your surgery  DO NOT SLEEP WITH PETS.   Day of Surgery: Shower with CHG soap. Do not wear jewelry, make up, nail polish, gel polish, artificial nails, or any other type of covering on natural nails including finger and toenails. If patients have artificial nails, gel coating, etc. that need to be removed by a nail salon please have this removed prior to surgery. Surgery may need to  be canceled/delayed if the surgeon/anesthesiologist feels like the patient is unable to be adequately monitored. Do not wear lotions, powders, perfumes/colognes, or deodorant. Do not shave 48 hours prior to surgery.  Men may shave face and neck. Do not bring valuables to the hospital. [facility] is not responsible for any belongings or valuables. Wear Clean/Comfortable clothing the morning of surgery Remember to brush your teeth WITH YOUR REGULAR TOOTHPASTE.   Please read over the following fact sheets that you were given.   3 days prior to your procedure or After your COVID test   You are not required to quarantine however you are required to wear a well-fitting mask when you are out and around people not in your household. If your mask becomes wet or soiled, replace with a new one.   Wash your hands often with soap and water for 20 seconds or clean your hands with an alcohol-based hand sanitizer that contains at least 60% alcohol.   Do not share personal items.   Notify your provider:  o if you are in close contact with someone who has COVID  o or if you develop a fever of 100.4 or greater, sneezing,  cough, sore throat, shortness of breath or body aches.

## 2021-08-12 NOTE — Progress Notes (Signed)
51 y.o. G4P2 Married [name] American female here for discussion of upcoming procedure.  RFA treatment of fibroid that appears to have a submucosal component planned due to menorrhagia and iron deficiency anemia.  She had to receive another iron infusion last month but her hb is normal at this time..  Pre-op evaluation thus far has included ultrasound performed 02/25/2021 showign uterus measuring 8 x 5.6 x 6.3cm with submucosal fibroid measuring 4.2cm x 3.3cm x 3.4cm.  We reviewed images.  I may need to resect some of this after the ablation so consent changed to include D&C for tissue sampling and also possible fibroid resection.    Procedure discussed with patient.  Recovery and pain management discussed.  Risks discussed including but not limited to bleeding, rare risk of transfusion, infection, 1% risk of uterine perforation with risks of fluid deficit causing cardiac arrythmia, cerebral swelling and/or need to stop procedure early.  Fluid emboli and rare risk of death discussed.  DVT/PE, rare risk of risk of bowel/bladder/ureteral/vascular injury.  Patient aware if pathology abnormal she may need additional treatment.  All questions answered.    Ob Hx:   Patient's last menstrual period was 07/13/2021.          Sexually active: Yes.   Birth control: bilateral tubal ligation Last pap: 03/2020, normal Last MMG: 11/06/2020 with negative follow up Smoking: No   Past Surgical History:  Procedure Laterality Date   ABLATION  12-28-2013   slow pathway modification of AVNRT by Dr Lovena Le   COLONOSCOPY N/A 11/17/2019   Procedure: COLONOSCOPY;  Surgeon: Daneil Dolin, MD;  [facility]O [facility];  Service: Endoscopy;  Laterality: N/A;  7:30   ELECTROPHYSIOLOGY STUDY N/A 12/28/2013   Procedure: ELECTROPHYSIOLOGY STUDY;  Surgeon: Evans Lance, MD;  Location: [facility] CATH LAB;  Service: Cardiovascular;  Laterality: N/A;   SUPRAVENTRICULAR TACHYCARDIA ABLATION N/A 12/28/2013   Procedure: SUPRAVENTRICULAR  TACHYCARDIA ABLATION;  Surgeon: Evans Lance, MD;  Location: [facility] CATH LAB;  Service: Cardiovascular;  Laterality: N/A;   TUBAL LIGATION  1996    Past Medical History:  Diagnosis Date   Arthritis    Phreesia 03/29/2020   Back pain    DDD (degenerative disc disease), lumbar    Diabetes mellitus without complication ([place])    Phreesia 03/29/2020   Essential hypertension, benign    GERD (gastroesophageal reflux disease)    Hypertension    Phreesia 03/29/2020   Joint pain    Lactose intolerance    Lumbar herniated disc    Migraine headache 2007   Obesity    Since childhood    Osteoarthritis    Pneumonia 2007   Sacroiliac joint disease    Spinal disease    Supraventricular tachycardia ([place])    Status post RFA July 2015 - Dr. Lovena Le   Type 2 diabetes mellitus ([place])    Varicose vein of leg    Vitamin D deficiency    Weight loss    Hopitalized 4 years ago after loosing 200lb on a supervised program, malnurished with severe protein deficiency     Allergies: Codeine and Metoclopramide hcl  Current Outpatient Medications  Medication Sig Dispense Refill   acetaminophen (TYLENOL) 500 MG tablet Take 1,000 mg by mouth in the morning and at bedtime.     cyclobenzaprine (FLEXERIL) 10 MG tablet TAKE 1 TABLET BY MOUTH AT BEDTIME 90 tablet 0   Diclofenac-miSOPROStol 50-0.2 MG TBEC TAKE 1 TABLET BY MOUTH TWICE DAILY AFTER A MEAL 60 tablet 3  famotidine (PEPCID) 40 MG tablet TAKE ONE TABLET ONCE DAILY WHEN YOU TAKE IBUPROFEN (Patient taking differently: Take 40 mg by mouth daily.) 90 tablet 0   ferrous sulfate 325 (65 FE) MG tablet Take 325 mg by mouth every other day.     HYDROcodone-acetaminophen (NORCO/VICODIN) 5-325 MG tablet Take 1 tablet by mouth every 6 (six) hours as needed for moderate pain. 30 tablet 0   losartan-hydrochlorothiazide (HYZAAR) 50-12.5 MG tablet TAKE 1 TABLET BY MOUTH ONCE DAILY 90 tablet 3   rosuvastatin (CRESTOR) 10 MG tablet Take one tablet by mouth twice weekly  (Wed/Sun) 8 tablet 0   Semaglutide, 1 MG/DOSE, 4 MG/3ML SOPN Inject 1 mg into the skin once a week. (Patient taking differently: Inject 1 mg into the skin every Friday.) 9 mL 0   Vitamin D, Ergocalciferol, (DRISDOL) 1.25 MG (50000 UNIT) CAPS capsule Take 1 capsule by mouth every 7 days. (Patient taking differently: Take 50,000 Units by mouth every Monday.) 8 capsule 0   No current facility-administered medications for this visit.    ROS: Pertinent items noted in HPI and remainder of comprehensive ROS otherwise negative.  Exam:   BP 107/66 (BP Location: Right Arm, Patient Position: Sitting, Cuff Size: Normal)    Pulse 91    Ht 5\' 11"  (1.803 m) Comment: reported   Wt 174 lb 6.4 oz (79.1 kg)    LMP 07/13/2021    BMI 24.32 kg/m   General appearance: alert and cooperative Head: Normocephalic, without obvious abnormality, atraumatic Neck: no adenopathy, supple, symmetrical, trachea midline and thyroid not enlarged, symmetric, no tenderness/mass/nodules Lungs: clear to auscultation bilaterally Heart: regular rate and rhythm, S1, S2 normal, no murmur, click, rub or gallop Extremities: extremities normal, atraumatic, no cyanosis or edema Neurologic: Grossly normal  Assessment/Plan: 1. Fibroids, submucosal - planning RFA of fibroids with possible resection and D&C for tissue for pathology  2. Menorrhagia with irregular cycle - most recent hb was normal  3. Iron deficiency anemia due to chronic blood loss

## 2021-08-13 ENCOUNTER — Encounter (HOSPITAL_COMMUNITY): Payer: Self-pay

## 2021-08-13 ENCOUNTER — Encounter (HOSPITAL_COMMUNITY)
Admission: RE | Admit: 2021-08-13 | Discharge: 2021-08-13 | Disposition: A | Discharge status: Home / Self Care | Payer: No Typology Code available for payment source | Source: Ambulatory Visit | Attending: Obstetrics & Gynecology | Admitting: Obstetrics & Gynecology

## 2021-08-13 ENCOUNTER — Other Ambulatory Visit: Payer: Self-pay

## 2021-08-13 VITALS — BP 109/69 | HR 71 | Temp 97.5°F | Resp 17 | Ht 71.0 in | Wt 173.6 lb

## 2021-08-13 DIAGNOSIS — K219 Gastro-esophageal reflux disease without esophagitis: Secondary | ICD-10-CM | POA: Diagnosis not present

## 2021-08-13 DIAGNOSIS — I872 Venous insufficiency (chronic) (peripheral): Secondary | ICD-10-CM | POA: Insufficient documentation

## 2021-08-13 DIAGNOSIS — Z87891 Personal history of nicotine dependence: Secondary | ICD-10-CM | POA: Insufficient documentation

## 2021-08-13 DIAGNOSIS — Z01812 Encounter for preprocedural laboratory examination: Secondary | ICD-10-CM | POA: Insufficient documentation

## 2021-08-13 DIAGNOSIS — N921 Excessive and frequent menstruation with irregular cycle: Secondary | ICD-10-CM | POA: Diagnosis not present

## 2021-08-13 DIAGNOSIS — I10 Essential (primary) hypertension: Secondary | ICD-10-CM | POA: Diagnosis not present

## 2021-08-13 DIAGNOSIS — D250 Submucous leiomyoma of uterus: Secondary | ICD-10-CM | POA: Diagnosis not present

## 2021-08-13 DIAGNOSIS — D509 Iron deficiency anemia, unspecified: Secondary | ICD-10-CM | POA: Diagnosis not present

## 2021-08-13 DIAGNOSIS — E119 Type 2 diabetes mellitus without complications: Secondary | ICD-10-CM | POA: Insufficient documentation

## 2021-08-13 DIAGNOSIS — Z01818 Encounter for other preprocedural examination: Secondary | ICD-10-CM

## 2021-08-13 DIAGNOSIS — I8390 Asymptomatic varicose veins of unspecified lower extremity: Secondary | ICD-10-CM | POA: Insufficient documentation

## 2021-08-13 HISTORY — DX: Other specified postprocedural states: Z98.890

## 2021-08-13 HISTORY — DX: Nausea with vomiting, unspecified: R11.2

## 2021-08-13 HISTORY — DX: Anemia, unspecified: D64.9

## 2021-08-13 LAB — BASIC METABOLIC PANEL
Anion gap: 9 (ref 5–15)
BUN: 13 mg/dL (ref 6–20)
CO2: 26 mmol/L (ref 22–32)
Calcium: 9.6 mg/dL (ref 8.9–10.3)
Chloride: 102 mmol/L (ref 98–111)
Creatinine, Ser: 0.88 mg/dL (ref 0.44–1.00)
GFR, Estimated: >60 mL/min (ref >60)
Glucose, Bld: 91 mg/dL (ref 70–99)
Potassium: 3.8 mmol/L (ref 3.5–5.1)
Sodium: 137 mmol/L (ref 135–145)

## 2021-08-13 LAB — GLUCOSE, CAPILLARY: Glucose-Capillary: 93 mg/dL (ref 70–99)

## 2021-08-13 LAB — TYPE AND SCREEN
ABO/RH(D): B POS
Antibody Screen: NEGATIVE

## 2021-08-13 NOTE — Progress Notes (Addendum)
PCP - Dr. Tula Nakayama Cardiologist - denies  PPM/ICD - n/a Device Orders - n/a Rep Notified - n/a  Chest x-ray - n/a EKG - 08/22/2020 Stress Test - denies ECHO - 09/09/2005 Cardiac Cath - denies  Sleep Study - denies CPAP - n/a  CBG at today's visit= 93 Checks Blood Sugar _____ times a day- Patient states she rarely checks her blood sugar. States she checks it maybe once or twice a month. Patient states her diabetes is well controlled.   Blood Thinner Instructions: n/a Aspirin Instructions: n/a  ERAS Protcol - Yes PRE-SURGERY Ensure or G2- n/a  COVID TEST- Ambulatory Surgery   Anesthesia review: Yes. History of Supraventricular Tachycardia. Ablation in 2015. No issues since per patient.   Patient denies shortness of breath, fever, cough and chest pain at PAT appointment   All instructions explained to the patient, with a verbal understanding of the material. Patient agrees to go over the instructions while at home for a better understanding. Patient also instructed to self quarantine after being tested for COVID-19. The opportunity to ask questions was provided.

## 2021-08-14 NOTE — Anesthesia Preprocedure Evaluation (Addendum)
Anesthesia Evaluation  Patient identified by MRN, date of birth, ID band Patient awake    Reviewed: Allergy & Precautions, H&P , NPO status , Patient's Chart, lab work & pertinent test results, reviewed documented beta blocker date and time   History of Anesthesia Complications (+) PONV and history of anesthetic complications  Airway Mallampati: I  TM Distance: >3 FB Neck ROM: full    Dental no notable dental hx. (+) Teeth Intact, Missing, Loose, Caps,    Pulmonary neg pulmonary ROS, former smoker,    Pulmonary exam normal breath sounds clear to auscultation       Cardiovascular Exercise Tolerance: Good hypertension, Pt. on medications negative cardio ROS   Rhythm:regular Rate:Normal     Neuro/Psych  Headaches,  Neuromuscular disease negative psych ROS   GI/Hepatic Neg liver ROS, GERD  ,  Endo/Other  diabetes  Renal/GU negative Renal ROS  negative genitourinary   Musculoskeletal  (+) Arthritis ,   Abdominal   Peds  Hematology  (+) Blood dyscrasia, anemia ,   Anesthesia Other Findings   Reproductive/Obstetrics negative OB ROS                          Anesthesia Physical Anesthesia Plan  ASA: 3  Anesthesia Plan: General   Post-op Pain Management: Minimal or no pain anticipated   Induction: Intravenous  PONV Risk Score and Plan: 4 or greater and Ondansetron, Dexamethasone, Treatment may vary due to age or medical condition and Scopolamine patch - Pre-op  Airway Management Planned: LMA  Additional Equipment: None  Intra-op Plan:   Post-operative Plan: Extubation in OR  Informed Consent: I have reviewed the patients History and Physical, chart, labs and discussed the procedure including the risks, benefits and alternatives for the proposed anesthesia with the patient or authorized representative who has indicated his/her understanding and acceptance.     Dental Advisory  Given  Plan Discussed with: CRNA and Anesthesiologist  Anesthesia Plan Comments: (PAT note written 08/14/2021 by Myra Gianotti, PA-C.  Patient is a 51 year old female scheduled for the above procedure.  RFA treatment of fibroid planned due to menorrhagia and iron deficiency anemia. Dr. Sabra Heck noted that she may need to resect some of the uterine fibroid after the ablation.  History includes former smoker (quit 08/05/12), post-operative N/V, HTN, SVT (s/p adenosine ~ 2010; s/p RF ablation 12/28/13), DM2, GERD, anemia, chronic venous insufficiency (with varicose veins). As needed EP follow-up recommended at 02/07/2014 office visit with Dr. Lovena Le.  She denied any recurrent SVT issues.  EKG: 08/22/20: Sinus  Rhythm  Low voltage in precordial leads.   -Incomplete right bundle branch block.   Echo 09/09/05: FINAL INTERPRETATION: 1. Normal left ventricular function. 2. Atrial septal aneurysm. 3. Small posterior pericardial effusion. 4. Trace mitral, tricuspid and pulmonic insufficiency. )      Anesthesia Quick Evaluation

## 2021-08-14 NOTE — Progress Notes (Signed)
Anesthesia Chart Review:  Case: [id number] Date/Time: 08/19/21 1327   Procedures:      Radio Frequency Ablation with Sonata     DILATATION AND CURETTAGE /HYSTEROSCOPY   Anesthesia type: Choice   Pre-op diagnosis:      Fibroid     Menorrhagia   Location: [facility] OR ROOM 07 / [place] OR   Surgeons: Megan Salon, MD       DISCUSSION: Patient is a 51 year old female scheduled for the above procedure.  RFA treatment of fibroid planned due to menorrhagia and iron deficiency anemia. Dr. Sabra Heck noted that she may need to resect some of the uterine fibroid after the ablation.   History includes former smoker (quit 08/05/12), post-operative N/V, HTN, SVT (s/p adenosine ~ 2010; s/p RF ablation 12/28/13), DM2, GERD, anemia, chronic venous insufficiency (with varicose veins).   As needed EP follow-up recommended at 02/07/2014 office visit with Dr. Lovena Le.  She denied any recurrent SVT issues.  Anesthesia team to evaluate on the day of surgery.    VS: BP 109/69    Pulse 71    Temp (!) 36.4 C (Oral)    Resp 17    Ht 5\' 11"  (1.803 m)    Wt 78.7 kg    LMP 07/13/2021 (Exact Date)    SpO2 100%    BMI 24.21 kg/m    PROVIDERS: Fayrene Helper, MD is PCP  - Tarri Abernethy, PA-C is hematology provider following for IDA requiring intermittent IV iron infusions, last 07/18/21.  Cristopher Peru, MD is EP cardiologist. As needed follow-up recommended after 02/07/14 office visit as she had no recurrent arrhythmias following EPS/catheter ablation SVT/AVNRT.   LABS: Labs reviewed: Acceptable for surgery.  She is for urine pregnancy test on the day of surgery. (all labs ordered are listed, but only abnormal results are displayed)  Labs Reviewed  GLUCOSE, CAPILLARY  BASIC METABOLIC PANEL  TYPE AND SCREEN  CBC on 07/30/21 showed H/H 13.3/39.9, PLT 239K. A1c 5.2% on 07/09/21.   EKG: 08/22/20: Sinus  Rhythm  Low voltage in precordial leads.   -Incomplete right bundle branch block.    CV: Echo 09/09/05:  FINAL  INTERPRETATION:  1.  Normal left ventricular function.  2.  Atrial septal aneurysm.  3.  Small posterior pericardial effusion.  4.  Trace mitral, tricuspid and pulmonic insufficiency.  Past Medical History:  Diagnosis Date   Anemia    Arthritis    Phreesia 03/29/2020   Back pain    DDD (degenerative disc disease), lumbar    Diabetes mellitus without complication ([place])    Phreesia 03/29/2020   Essential hypertension, benign    GERD (gastroesophageal reflux disease)    Hypertension    Phreesia 03/29/2020   Joint pain    Lactose intolerance    Lumbar herniated disc    Migraine headache 2007   Obesity    Since childhood    Osteoarthritis    Pneumonia 2007   PONV (postoperative nausea and vomiting)    Patient states she woke up during tubal ligation and has had PONV in the past.   Sacroiliac joint disease    Spinal disease    Supraventricular tachycardia [facility])    Status post RFA July 2015 - Dr. Lovena Le   Type 2 diabetes mellitus ([place])    Varicose vein of leg    Vitamin D deficiency    Weight loss    Hopitalized 4 years ago after loosing 200lb on a supervised program, malnurished with severe  protein deficiency     Past Surgical History:  Procedure Laterality Date   ABLATION  12-28-2013   slow pathway modification of AVNRT by Dr Lovena Le   COLONOSCOPY N/A 11/17/2019   Procedure: COLONOSCOPY;  Surgeon: Daneil Dolin, MD;  Location: [facility] ENDO SUITE;  Service: Endoscopy;  Laterality: N/A;  7:30   ELECTROPHYSIOLOGY STUDY N/A 12/28/2013   Procedure: ELECTROPHYSIOLOGY STUDY;  Surgeon: Evans Lance, MD;  Location: [facility] CATH LAB;  Service: Cardiovascular;  Laterality: N/A;   SUPRAVENTRICULAR TACHYCARDIA ABLATION N/A 12/28/2013   Procedure: SUPRAVENTRICULAR TACHYCARDIA ABLATION;  Surgeon: Evans Lance, MD;  Location: [facility] CATH LAB;  Service: Cardiovascular;  Laterality: N/A;   TUBAL LIGATION  1996    MEDICATIONS:  acetaminophen (TYLENOL) 500 MG tablet   cyclobenzaprine (FLEXERIL) 10 MG  tablet   Diclofenac-miSOPROStol 50-0.2 MG TBEC   famotidine (PEPCID) 40 MG tablet   ferrous sulfate 325 (65 FE) MG tablet   HYDROcodone-acetaminophen (NORCO/VICODIN) 5-325 MG tablet   losartan-hydrochlorothiazide (HYZAAR) 50-12.5 MG tablet   rosuvastatin (CRESTOR) 10 MG tablet   Semaglutide, 1 MG/DOSE, 4 MG/3ML SOPN   Vitamin D, Ergocalciferol, (DRISDOL) 1.25 MG (50000 UNIT) CAPS capsule   No current facility-administered medications for this encounter.    Myra Gianotti, PA-C Surgical Short Stay/Anesthesiology [facility] Phone [phone] [facility] Phone [phone] 08/14/2021 3:08 PM

## 2021-08-18 ENCOUNTER — Encounter (INDEPENDENT_AMBULATORY_CARE_PROVIDER_SITE_OTHER): Payer: Self-pay | Admitting: Adult Health

## 2021-08-18 ENCOUNTER — Other Ambulatory Visit: Payer: Self-pay

## 2021-08-18 ENCOUNTER — Ambulatory Visit (INDEPENDENT_AMBULATORY_CARE_PROVIDER_SITE_OTHER): Payer: No Typology Code available for payment source | Admitting: Adult Health

## 2021-08-18 ENCOUNTER — Other Ambulatory Visit (HOSPITAL_COMMUNITY): Payer: Self-pay

## 2021-08-18 VITALS — BP 112/67 | HR 90 | Temp 97.7°F | Ht 71.0 in | Wt 172.0 lb

## 2021-08-18 DIAGNOSIS — Z9189 Other specified personal risk factors, not elsewhere classified: Secondary | ICD-10-CM

## 2021-08-18 DIAGNOSIS — E669 Obesity, unspecified: Secondary | ICD-10-CM | POA: Diagnosis not present

## 2021-08-18 DIAGNOSIS — E559 Vitamin D deficiency, unspecified: Secondary | ICD-10-CM | POA: Diagnosis not present

## 2021-08-18 DIAGNOSIS — Z6830 Body mass index (BMI) 30.0-30.9, adult: Secondary | ICD-10-CM

## 2021-08-18 DIAGNOSIS — E785 Hyperlipidemia, unspecified: Secondary | ICD-10-CM

## 2021-08-18 DIAGNOSIS — E6609 Other obesity due to excess calories: Secondary | ICD-10-CM

## 2021-08-18 DIAGNOSIS — Z7985 Long-term (current) use of injectable non-insulin antidiabetic drugs: Secondary | ICD-10-CM

## 2021-08-18 DIAGNOSIS — E1169 Type 2 diabetes mellitus with other specified complication: Secondary | ICD-10-CM

## 2021-08-18 DIAGNOSIS — Z6824 Body mass index (BMI) 24.0-24.9, adult: Secondary | ICD-10-CM

## 2021-08-18 MED ORDER — SEMAGLUTIDE (1 MG/DOSE) 4 MG/3ML ~~LOC~~ SOPN
1.0000 mg | PEN_INJECTOR | SUBCUTANEOUS | 0 refills | Status: DC
  Filled 2021-08-18 – 2021-09-17 (×2): qty 9, 84d supply, fill #0

## 2021-08-18 MED ORDER — VITAMIN D (ERGOCALCIFEROL) 1.25 MG (50000 UNIT) PO CAPS
50000.0000 [IU] | ORAL_CAPSULE | ORAL | 0 refills | Status: DC
  Filled 2021-08-18: qty 8, 56d supply, fill #0

## 2021-08-18 MED ORDER — ROSUVASTATIN CALCIUM 10 MG PO TABS
ORAL_TABLET | ORAL | 0 refills | Status: DC
  Filled 2021-08-18: qty 8, 28d supply, fill #0

## 2021-08-18 NOTE — Addendum Note (Signed)
Addended by: Lebron Conners on: 08/18/2021 12:37 PM   Modules accepted: Orders

## 2021-08-18 NOTE — Telephone Encounter (Signed)
FYI

## 2021-08-18 NOTE — Progress Notes (Signed)
Chief Complaint:   OBESITY [name] is here to discuss her progress with her obesity treatment plan along with follow-up of her obesity related diagnoses. [name] is on the Category 3 Plan and states she is following her eating plan approximately 96% of the time. [name] states she is walking for 60 minutes 1 time per week.  Today's visit was #: 14 Starting weight: 205 lbs Starting date: 08/22/2020 Today's weight: 172 lbs Today's date: 08/18/2021 Total lbs lost to date: 33 lbs Total lbs lost since last in-office visit: 4 lbs  Interim History:  On 08/19/2021, Shana is scheduled for D/C with hysteroscopy with Dr. Sabra Heck - outpatient procedure. Off this week for recovery.  Discuss maintenance phase at next office visit.  Subjective:   1. Type 2 diabetes mellitus with other specified complication, without long-term current use of insulin (HCC) Ozempic 1 mg has allowed her to appropriately read hunger signals. She denies mass in neck, dysphagia, dyspepsia, persistent hoarseness,abd pain, or GI upset. She denies sx's of hypoglycemia.  2. Hyperlipidemia associated with type 2 diabetes mellitus (HCC) Crestor 10 mg 2x week - denies myalgias. Significant reduction of LDL levels since addition of statin therapy.  3. Vitamin D deficiency She is currently taking prescription ergocalciferol 50,000 IU each week. She denies nausea, vomiting or muscle weakness.  4. At risk for osteoporosis [name] is at higher risk of osteopenia and osteoporosis due to Vitamin D deficiency and obesity.   Assessment/Plan:   1. Type 2 diabetes mellitus with other specified complication, without long-term current use of insulin (HCC) Refill Ozempic 1 mg once weekly.  - Refill Semaglutide, 1 MG/DOSE, 4 MG/3ML SOPN; Inject 1 mg into the skin once a week.  Dispense: 9 mL; Refill: 0  2. Hyperlipidemia associated with type 2 diabetes mellitus (HCC) Refill Crestor 10 mg 2x/week.  - Refill rosuvastatin (CRESTOR)  10 MG tablet; Take one tablet by mouth twice weekly (Wed/Sun)  Dispense: 8 tablet; Refill: 0  3. Vitamin D deficiency Refill ergocalciferol 50,000 IU once weekly.  - Refill Vitamin D, Ergocalciferol, (DRISDOL) 1.25 MG (50000 UNIT) CAPS capsule; Take 1 capsule by mouth every 7 days.  Dispense: 8 capsule; Refill: 0  4. At risk for osteoporosis [name] was given approximately 15 minutes of osteoporosis prevention counseling today. [name] is at risk for osteopenia and osteoporosis due to her Vitamin D deficiency. She was encouraged to take her Vit D and follow her higher calcium diet and increase strengthening exercise to help strengthen her bones and decrease her risk of osteopenia and osteoporosis.  5. Obesity, current BMI 24.0  [name] is currently in the action stage of change. As such, her goal is to continue with weight loss efforts. She has agreed to the Category 3 Plan.   Exercise goals:  As is.  Check Fasting labs at next OV.  Behavioral modification strategies: increasing lean protein intake, decreasing simple carbohydrates, meal planning and cooking strategies, keeping healthy foods in the home, and planning for success.  [name] has agreed to follow-up with our clinic in 6 weeks, fasting. She was informed of the importance of frequent follow-up visits to maximize her success with intensive lifestyle modifications for her multiple health conditions.   Objective:   Blood pressure 112/67, pulse 90, temperature 97.7 F (36.5 C), height 5\' 11"  (1.803 m), weight 172 lb (78 kg), SpO2 100 %. Body mass index is 23.99 kg/m.  General: Cooperative, alert, well developed, in no acute distress. HEENT: Conjunctivae and lids unremarkable. Cardiovascular: Regular  rhythm.  Lungs: Normal work of breathing. Neurologic: No focal deficits.   Lab Results  Component Value Date   CREATININE 0.88 08/13/2021   BUN 13 08/13/2021   NA 137 08/13/2021   K 3.8 08/13/2021   CL 102 08/13/2021   CO2 26  08/13/2021   Lab Results  Component Value Date   ALT 10 07/09/2021   AST 14 07/09/2021   ALKPHOS 47 07/09/2021   BILITOT <0.2 07/09/2021   Lab Results  Component Value Date   HGBA1C 5.2 07/09/2021   HGBA1C 5.0 04/15/2021   HGBA1C 5.8 (H) 08/22/2020   HGBA1C 5.4 03/15/2019   HGBA1C 5.4 08/23/2017   Lab Results  Component Value Date   INSULIN 8.6 07/09/2021   INSULIN 19.2 04/15/2021   INSULIN 7.8 08/22/2020   Lab Results  Component Value Date   TSH 1.250 08/22/2020   Lab Results  Component Value Date   CHOL 163 07/09/2021   HDL 64 07/09/2021   LDLCALC 80 07/09/2021   TRIG 104 07/09/2021   CHOLHDL 2.5 07/09/2021   Lab Results  Component Value Date   VD25OH 29.2 (L) 04/15/2021   VD25OH 38.5 08/22/2020   VD25OH 30.4 12/07/2019   Lab Results  Component Value Date   WBC 8.0 07/30/2021   HGB 13.3 07/30/2021   HCT 39.9 07/30/2021   MCV 89.5 07/30/2021   PLT 239 07/30/2021   Lab Results  Component Value Date   IRON 56 07/30/2021   TIBC 329 07/30/2021   FERRITIN 273 07/30/2021   Attestation Statements:   Reviewed by clinician on day of visit: allergies, medications, problem list, medical history, surgical history, family history, social history, and previous encounter notes.  I, Water quality scientist, CMA, am acting as Location manager for Mina Marble, NP.  I have reviewed the above documentation for accuracy and completeness, and I agree with the above. - Tarica Harl d. Chitara Clonch, NP-C

## 2021-08-19 ENCOUNTER — Other Ambulatory Visit: Payer: Self-pay

## 2021-08-19 ENCOUNTER — Ambulatory Visit (HOSPITAL_COMMUNITY): Payer: No Typology Code available for payment source | Admitting: Vascular Surgery

## 2021-08-19 ENCOUNTER — Ambulatory Visit (HOSPITAL_BASED_OUTPATIENT_CLINIC_OR_DEPARTMENT_OTHER): Payer: No Typology Code available for payment source | Admitting: Anesthesiology

## 2021-08-19 ENCOUNTER — Encounter (HOSPITAL_BASED_OUTPATIENT_CLINIC_OR_DEPARTMENT_OTHER): Payer: Self-pay | Admitting: *Deleted

## 2021-08-19 ENCOUNTER — Encounter (HOSPITAL_COMMUNITY)
Admission: RE | Disposition: A | Discharge status: Home / Self Care | Payer: Self-pay | Source: Ambulatory Visit | Attending: Obstetrics & Gynecology

## 2021-08-19 ENCOUNTER — Encounter (HOSPITAL_COMMUNITY): Payer: Self-pay | Admitting: Obstetrics & Gynecology

## 2021-08-19 ENCOUNTER — Ambulatory Visit (HOSPITAL_COMMUNITY)
Admission: RE | Admit: 2021-08-19 | Discharge: 2021-08-19 | Disposition: A | Discharge status: Home / Self Care | Payer: No Typology Code available for payment source | Source: Ambulatory Visit | Attending: Obstetrics & Gynecology | Admitting: Obstetrics & Gynecology

## 2021-08-19 DIAGNOSIS — D649 Anemia, unspecified: Secondary | ICD-10-CM | POA: Diagnosis not present

## 2021-08-19 DIAGNOSIS — N920 Excessive and frequent menstruation with regular cycle: Secondary | ICD-10-CM | POA: Insufficient documentation

## 2021-08-19 DIAGNOSIS — I10 Essential (primary) hypertension: Secondary | ICD-10-CM | POA: Diagnosis not present

## 2021-08-19 DIAGNOSIS — K219 Gastro-esophageal reflux disease without esophagitis: Secondary | ICD-10-CM | POA: Insufficient documentation

## 2021-08-19 DIAGNOSIS — D250 Submucous leiomyoma of uterus: Secondary | ICD-10-CM

## 2021-08-19 DIAGNOSIS — Z87891 Personal history of nicotine dependence: Secondary | ICD-10-CM | POA: Insufficient documentation

## 2021-08-19 DIAGNOSIS — E119 Type 2 diabetes mellitus without complications: Secondary | ICD-10-CM | POA: Insufficient documentation

## 2021-08-19 DIAGNOSIS — N921 Excessive and frequent menstruation with irregular cycle: Secondary | ICD-10-CM

## 2021-08-19 DIAGNOSIS — D630 Anemia in neoplastic disease: Secondary | ICD-10-CM | POA: Diagnosis not present

## 2021-08-19 DIAGNOSIS — G709 Myoneural disorder, unspecified: Secondary | ICD-10-CM | POA: Insufficient documentation

## 2021-08-19 DIAGNOSIS — D259 Leiomyoma of uterus, unspecified: Secondary | ICD-10-CM | POA: Diagnosis present

## 2021-08-19 DIAGNOSIS — M1990 Unspecified osteoarthritis, unspecified site: Secondary | ICD-10-CM | POA: Insufficient documentation

## 2021-08-19 HISTORY — PX: HYSTEROSCOPY WITH D & C: SHX1775

## 2021-08-19 LAB — VITAMIN D 25 HYDROXY (VIT D DEFICIENCY, FRACTURES): Vit D, 25-Hydroxy: 67.6 ng/mL (ref 30.0–100.0)

## 2021-08-19 LAB — GLUCOSE, CAPILLARY
Glucose-Capillary: 67 mg/dL — ABNORMAL LOW (ref 70–99)
Glucose-Capillary: 42 mg/dL — CL (ref 70–99)
Glucose-Capillary: 79 mg/dL (ref 70–99)
Glucose-Capillary: 54 mg/dL — ABNORMAL LOW (ref 70–99)
Glucose-Capillary: 41 mg/dL — CL (ref 70–99)
Glucose-Capillary: 64 mg/dL — ABNORMAL LOW (ref 70–99)
Glucose-Capillary: 89 mg/dL (ref 70–99)

## 2021-08-19 LAB — POCT PREGNANCY, URINE: Preg Test, Ur: NEGATIVE

## 2021-08-19 LAB — ABO/RH: ABO/RH(D): B POS

## 2021-08-19 SURGERY — RADIOFREQUENCY ABLATION, LEIOMYOMA, UTERUS, TRANSCERVICAL APPROACH, WITH US GUIDANCE
Anesthesia: General

## 2021-08-19 MED ORDER — ACETAMINOPHEN 500 MG PO TABS
ORAL_TABLET | ORAL | Status: AC
  Filled 2021-08-19: qty 2

## 2021-08-19 MED ORDER — ONDANSETRON HCL 4 MG/2ML IJ SOLN
INTRAMUSCULAR | Status: AC
  Filled 2021-08-19: qty 2

## 2021-08-19 MED ORDER — PHENYLEPHRINE HCL (PRESSORS) 10 MG/ML IV SOLN
INTRAVENOUS | Status: AC
  Filled 2021-08-19: qty 1

## 2021-08-19 MED ORDER — GLUCAGON HCL RDNA (DIAGNOSTIC) 1 MG IJ SOLR
INTRAMUSCULAR | Status: AC
  Filled 2021-08-19: qty 1

## 2021-08-19 MED ORDER — PHENYLEPHRINE 40 MCG/ML (10ML) SYRINGE FOR IV PUSH (FOR BLOOD PRESSURE SUPPORT)
PREFILLED_SYRINGE | INTRAVENOUS | Status: AC
  Filled 2021-08-19: qty 10

## 2021-08-19 MED ORDER — OXYCODONE HCL 5 MG PO TABS: 5.0000 mg | ORAL_TABLET | Freq: Once | ORAL | Status: DC | PRN

## 2021-08-19 MED ORDER — LIDOCAINE-EPINEPHRINE 1 %-1:100000 IJ SOLN
INTRAMUSCULAR | Status: DC | PRN
  Administered 2021-08-19: 10 mL

## 2021-08-19 MED ORDER — PHENYLEPHRINE 40 MCG/ML (10ML) SYRINGE FOR IV PUSH (FOR BLOOD PRESSURE SUPPORT)
PREFILLED_SYRINGE | INTRAVENOUS | Status: DC | PRN
Start: 2021-08-19 — End: 2021-08-19
  Administered 2021-08-19: 160 ug via INTRAVENOUS
  Administered 2021-08-19: 120 ug via INTRAVENOUS
  Administered 2021-08-19: 80 ug via INTRAVENOUS

## 2021-08-19 MED ORDER — GLUCOSE 40 % PO GEL
ORAL | Status: AC
  Filled 2021-08-19: qty 1

## 2021-08-19 MED ORDER — ACETAMINOPHEN 325 MG PO TABS: 325.0000 mg | ORAL_TABLET | ORAL | Status: DC | PRN

## 2021-08-19 MED ORDER — LACTATED RINGERS IV SOLN: INTRAVENOUS | Status: DC

## 2021-08-19 MED ORDER — PROPOFOL 1000 MG/100ML IV EMUL
INTRAVENOUS | Status: AC
  Filled 2021-08-19: qty 100

## 2021-08-19 MED ORDER — CELECOXIB 200 MG PO CAPS: 200.0000 mg | ORAL_CAPSULE | Freq: Once | ORAL | Status: AC

## 2021-08-19 MED ORDER — ACETAMINOPHEN 500 MG PO TABS
1000.0000 mg | ORAL_TABLET | ORAL | Status: DC
  Filled 2021-08-19: qty 2

## 2021-08-19 MED ORDER — DEXAMETHASONE SODIUM PHOSPHATE 10 MG/ML IJ SOLN
INTRAMUSCULAR | Status: AC
  Filled 2021-08-19: qty 1

## 2021-08-19 MED ORDER — IBUPROFEN 800 MG PO TABS: 800.0000 mg | ORAL_TABLET | Freq: Three times a day (TID) | ORAL | 0 refills | Status: DC | PRN

## 2021-08-19 MED ORDER — INSULIN ASPART 100 UNIT/ML IJ SOLN: 0.0000 [IU] | INTRAMUSCULAR | Status: DC | PRN

## 2021-08-19 MED ORDER — EPHEDRINE 5 MG/ML INJ
INTRAVENOUS | Status: AC
  Filled 2021-08-19: qty 5

## 2021-08-19 MED ORDER — DEXTROSE 50 % IV SOLN
INTRAVENOUS | Status: AC
  Filled 2021-08-19: qty 50

## 2021-08-19 MED ORDER — DEXTROSE 50 % IV SOLN
0.5000 | Freq: Once | INTRAVENOUS | Status: AC
  Administered 2021-08-19: 25 mL via INTRAVENOUS

## 2021-08-19 MED ORDER — PHENYLEPHRINE HCL-NACL 20-0.9 MG/250ML-% IV SOLN
INTRAVENOUS | Status: DC | PRN
  Administered 2021-08-19: 30 ug/min via INTRAVENOUS

## 2021-08-19 MED ORDER — MIDAZOLAM HCL 2 MG/2ML IJ SOLN
INTRAMUSCULAR | Status: DC | PRN
Start: 2021-08-19 — End: 2021-08-19
  Administered 2021-08-19: 2 mg via INTRAVENOUS

## 2021-08-19 MED ORDER — CELECOXIB 200 MG PO CAPS
ORAL_CAPSULE | ORAL | Status: AC
  Administered 2021-08-19: 200 mg via ORAL
  Filled 2021-08-19: qty 1

## 2021-08-19 MED ORDER — POVIDONE-IODINE 10 % EX SWAB
2.0000 "application " | Freq: Once | CUTANEOUS | Status: AC
  Administered 2021-08-19: 2 via TOPICAL

## 2021-08-19 MED ORDER — DEXAMETHASONE SODIUM PHOSPHATE 10 MG/ML IJ SOLN
INTRAMUSCULAR | Status: DC | PRN
  Administered 2021-08-19: 10 mg via INTRAVENOUS

## 2021-08-19 MED ORDER — LIDOCAINE-EPINEPHRINE 1 %-1:100000 IJ SOLN
INTRAMUSCULAR | Status: AC
  Filled 2021-08-19: qty 1

## 2021-08-19 MED ORDER — LIDOCAINE 2% (20 MG/ML) 5 ML SYRINGE
INTRAMUSCULAR | Status: DC | PRN
  Administered 2021-08-19: 60 mg via INTRAVENOUS

## 2021-08-19 MED ORDER — OXYCODONE HCL 5 MG/5ML PO SOLN: 5.0000 mg | Freq: Once | ORAL | Status: DC | PRN

## 2021-08-19 MED ORDER — HYDROCODONE-ACETAMINOPHEN 5-325 MG PO TABS: 1.0000 | ORAL_TABLET | ORAL | 0 refills | Status: DC | PRN

## 2021-08-19 MED ORDER — ACETAMINOPHEN 160 MG/5ML PO SOLN: 325.0000 mg | ORAL | Status: DC | PRN

## 2021-08-19 MED ORDER — FENTANYL CITRATE (PF) 250 MCG/5ML IJ SOLN
INTRAMUSCULAR | Status: DC | PRN
  Administered 2021-08-19: 25 ug via INTRAVENOUS
  Administered 2021-08-19: 50 ug via INTRAVENOUS
  Administered 2021-08-19 (×2): 25 ug via INTRAVENOUS

## 2021-08-19 MED ORDER — ONDANSETRON HCL 4 MG/2ML IJ SOLN
INTRAMUSCULAR | Status: DC | PRN
Start: 2021-08-19 — End: 2021-08-19
  Administered 2021-08-19: 4 mg via INTRAVENOUS

## 2021-08-19 MED ORDER — FENTANYL CITRATE (PF) 100 MCG/2ML IJ SOLN: 25.0000 ug | INTRAMUSCULAR | Status: DC | PRN

## 2021-08-19 MED ORDER — DEXTROSE 50 % IV SOLN
INTRAVENOUS | Status: DC | PRN
  Administered 2021-08-19: .5 via INTRAVENOUS

## 2021-08-19 MED ORDER — PROPOFOL 10 MG/ML IV BOLUS
INTRAVENOUS | Status: AC
  Filled 2021-08-19: qty 20

## 2021-08-19 MED ORDER — FENTANYL CITRATE (PF) 250 MCG/5ML IJ SOLN
INTRAMUSCULAR | Status: AC
  Filled 2021-08-19: qty 5

## 2021-08-19 MED ORDER — ONDANSETRON HCL 4 MG/2ML IJ SOLN: 4.0000 mg | Freq: Once | INTRAMUSCULAR | Status: DC | PRN

## 2021-08-19 MED ORDER — ORAL CARE MOUTH RINSE: 15.0000 mL | Freq: Once | OROMUCOSAL | Status: AC

## 2021-08-19 MED ORDER — PROPOFOL 500 MG/50ML IV EMUL
INTRAVENOUS | Status: DC | PRN
  Administered 2021-08-19: 25 ug/kg/min via INTRAVENOUS

## 2021-08-19 MED ORDER — CHLORHEXIDINE GLUCONATE 0.12 % MT SOLN
15.0000 mL | Freq: Once | OROMUCOSAL | Status: AC
  Administered 2021-08-19: 15 mL via OROMUCOSAL
  Filled 2021-08-19: qty 15

## 2021-08-19 MED ORDER — EPHEDRINE SULFATE-NACL 50-0.9 MG/10ML-% IV SOSY
PREFILLED_SYRINGE | INTRAVENOUS | Status: DC | PRN
Start: 2021-08-19 — End: 2021-08-19
  Administered 2021-08-19: 5 mg via INTRAVENOUS
  Administered 2021-08-19 (×2): 10 mg via INTRAVENOUS

## 2021-08-19 MED ORDER — PROPOFOL 10 MG/ML IV BOLUS
INTRAVENOUS | Status: DC | PRN
  Administered 2021-08-19 (×3): 50 mg via INTRAVENOUS
  Administered 2021-08-19: 150 mg via INTRAVENOUS

## 2021-08-19 MED ORDER — MIDAZOLAM HCL 2 MG/2ML IJ SOLN
INTRAMUSCULAR | Status: AC
  Filled 2021-08-19: qty 2

## 2021-08-19 MED ORDER — DEXTROSE 50 % IV SOLN
25.0000 mL | Freq: Once | INTRAVENOUS | Status: AC
Start: 2021-08-19 — End: 2021-08-19
  Administered 2021-08-19: 25 mL via INTRAVENOUS

## 2021-08-19 MED ORDER — GLUCOSE 4 G PO CHEW
CHEWABLE_TABLET | ORAL | Status: AC
  Filled 2021-08-19: qty 1

## 2021-08-19 MED ORDER — SODIUM CHLORIDE 0.9 % IR SOLN
Status: DC | PRN
  Administered 2021-08-19: 9000 mL

## 2021-08-19 MED ORDER — SCOPOLAMINE 1 MG/3DAYS TD PT72
1.0000 | MEDICATED_PATCH | TRANSDERMAL | Status: DC
  Administered 2021-08-19: 1.5 mg via TRANSDERMAL
  Filled 2021-08-19: qty 1

## 2021-08-19 MED ORDER — MEPERIDINE HCL 25 MG/ML IJ SOLN: 6.2500 mg | INTRAMUSCULAR | Status: DC | PRN

## 2021-08-19 SURGICAL SUPPLY — 27 items
BIPOLAR CUTTING LOOP 21FR (ELECTRODE)
CATH ROBINSON RED A/P 16FR (CATHETERS) ×3 IMPLANT
DEVICE MYOSURE REACH (MISCELLANEOUS) IMPLANT
DILATOR CANAL MILEX (MISCELLANEOUS) IMPLANT
ELECT DISPERSIVE SONATA (ELECTRODE) ×6 IMPLANT
ELECT REM PT RETURN 9FT ADLT (ELECTROSURGICAL)
ELECTRODE REM PT RTRN 9FT ADLT (ELECTROSURGICAL) IMPLANT
GAUZE 4X4 16PLY ~~LOC~~+RFID DBL (SPONGE) ×3 IMPLANT
GLOVE SURG LTX SZ6.5 (GLOVE) ×6 IMPLANT
GLOVE SURG UNDER POLY LF SZ7 (GLOVE) ×6 IMPLANT
GOWN STRL REUS W/ TWL LRG LVL3 (GOWN DISPOSABLE) ×4 IMPLANT
GOWN STRL REUS W/TWL LRG LVL3 (GOWN DISPOSABLE) ×4
HANDPIECE RFA SONATA (MISCELLANEOUS) ×3 IMPLANT
IV NS IRRIG 3000ML ARTHROMATIC (IV SOLUTION) ×3 IMPLANT
KIT PROCEDURE FLUENT (KITS) ×4 IMPLANT
LOOP CUTTING BIPOLAR 21FR (ELECTRODE) IMPLANT
MYOSURE XL FIBROID (MISCELLANEOUS) ×2
PACK VAGINAL MINOR WOMEN LF (CUSTOM PROCEDURE TRAY) ×3 IMPLANT
PAD ABD 7.5X8 STRL (GAUZE/BANDAGES/DRESSINGS) ×1 IMPLANT
PAD OB MATERNITY 4.3X12.25 (PERSONAL CARE ITEMS) ×3 IMPLANT
SEAL ROD LENS SCOPE MYOSURE (ABLATOR) IMPLANT
SYR 30ML LL (SYRINGE) ×1 IMPLANT
SYSTEM TISS REMOVAL MYOSURE XL (MISCELLANEOUS) IMPLANT
TOWEL GREEN STERILE FF (TOWEL DISPOSABLE) ×6 IMPLANT
TRAY FOLEY W/BAG SLVR 14FR (SET/KITS/TRAYS/PACK) ×3 IMPLANT
UNDERPAD 30X36 HEAVY ABSORB (UNDERPADS AND DIAPERS) ×3 IMPLANT
WATER STERILE IRR 1000ML POUR (IV SOLUTION) ×3 IMPLANT

## 2021-08-19 NOTE — Anesthesia Postprocedure Evaluation (Signed)
Anesthesia Post Note  Patient: [name]  Procedure(s) Performed: Radio Frequency Ablation with Sonata DILATATION AND CURETTAGE /HYSTEROSCOPY     Patient location during evaluation: PACU Anesthesia Type: General Level of consciousness: awake and alert Pain management: pain level controlled Vital Signs Assessment: post-procedure vital signs reviewed and stable Respiratory status: spontaneous breathing, nonlabored ventilation, respiratory function stable and patient connected to nasal cannula oxygen Cardiovascular status: blood pressure returned to baseline and stable Postop Assessment: no apparent nausea or vomiting Anesthetic complications: yes Comments: Dental Damage - Discussed with patient in PACU, she endorses significant periodontal disease and the possibility of the tooth being loose prior to her procedure.    No notable events documented.  Last Vitals:  Vitals:   08/19/21 1840 08/19/21 1855  BP: (!) 116/91 118/74  Pulse: 78 65  Resp: 20 19  Temp:    SpO2: 99% 98%    Last Pain:  Vitals:   08/19/21 1840  TempSrc:   PainSc: 0-No pain                 Effie Berkshire

## 2021-08-19 NOTE — Transfer of Care (Signed)
Immediate Anesthesia Transfer of Care Note  Patient: [name]  Procedure(s) Performed: Radio Frequency Ablation with Sonata DILATATION AND CURETTAGE /HYSTEROSCOPY  Patient Location: PACU  Anesthesia Type:General  Level of Consciousness: drowsy  Airway & Oxygen Therapy: Patient Spontanous Breathing and Patient connected to face mask oxygen  Post-op Assessment: Report given to RN and Post -op Vital signs reviewed and stable  Post vital signs: Reviewed and stable  Last Vitals:  Vitals Value Taken Time  BP 124/101 08/19/21 1806  Temp    Pulse 86 08/19/21 1808  Resp 24 08/19/21 1808  SpO2 100 % 08/19/21 1808  Vitals shown include unvalidated device data.  Last Pain:  Vitals:   08/19/21 1137  TempSrc:   PainSc: 0-No pain      Patients Stated Pain Goal: 2 (27/03/50 0938)  Complications: No notable events documented.

## 2021-08-19 NOTE — Progress Notes (Signed)
@  1510 Blood sugar 54, pt is asymptomatic. Dr Ambrose Pancoast notified, no further order.

## 2021-08-19 NOTE — Anesthesia Procedure Notes (Signed)
Procedure Name: LMA Insertion Date/Time: 08/19/2021 4:23 PM Performed by: Albertha Ghee, MD Pre-anesthesia Checklist: Patient identified, Emergency Drugs available, Suction available and Patient being monitored Patient Re-evaluated:Patient Re-evaluated prior to induction Oxygen Delivery Method: Circle system utilized Preoxygenation: Pre-oxygenation with 100% oxygen Induction Type: IV induction Ventilation: Mask ventilation without difficulty LMA: LMA inserted LMA Size: 4.0 Laser Tube: Cuffed inflated with minimal occlusive pressure - saline Number of attempts: 2 (first attempt with LMA 5 unsuccessful.) Placement Confirmation: positive ETCO2 and breath sounds checked- equal and bilateral Tube secured with: Tape Dental Injury: Teeth and Oropharynx as per pre-operative assessment

## 2021-08-19 NOTE — Op Note (Signed)
08/19/2021  6:04 PM  PATIENT:  [name]  51 y.o. female  PRE-OPERATIVE DIAGNOSIS:  Fibroid,  Menorrhagia  POST-OPERATIVE DIAGNOSIS:  same  PROCEDURE:  Procedure(s): Radio Frequency Ablation with Sonata DILATATION AND CURETTAGE /HYSTEROSCOPY Hystoroscopic fibroid resection  SURGEON:  Megan Salon  ASSISTANTS: OR staff  ANESTHESIA:   general  ESTIMATED BLOOD LOSS: 50 mL  BLOOD ADMINISTERED:none   FLUIDS: 1700cc LR  UOP: 200cc clear UOP  SPECIMEN:  endometrial curetting and desiccated fibroid tissue  DISPOSITION OF SPECIMEN:  PATHOLOGY  FINDINGS: submucosal fibroid located posteriorly in the endometrial cavity  DESCRIPTION OF OPERATION: Patient was taken to the operating room.  She is placed in the supine position. SCDs were on her lower extremities and functioning properly. General anesthesia with an LMA was administered without difficulty. Dr. Marcie Bal, anesthesia, oversaw case.  Time out performed.    Legs were then placed in the [place] in the low lithotomy position. The legs were lifted to the high lithotomy position and the Betadine prep was used on the inner thighs perineum and vagina x3. Patient was draped in a normal standard fashion. An in and out catheterization with a red rubber Foley catheter was performed. Approximately 200 cc of clear urine was noted. A bivalve speculum was placed the vagina. The anterior lip of the cervix was grasped with single-tooth tenaculum.  A paracervical block of 1% lidocaine mixed one-to-one with epinephrine (1:100,000 units).  10 cc was used total. The cervix is dilated up to #21 [facility] dilators. The endometrial cavity sounded to 8 cm.   A myosure diagnostic hysteroscope was obtained. Normal saline was used as a hysteroscopic fluid. The hysteroscope was advanced through the endocervical canal into the endometrial cavity. The tubal ostia were noted bilaterally. Additional findings included posterior submucosal fibroid.   The hysteroscope was removed.   Cervix dilated up to #27 [facility] dilator.  Then the Sonata handpiece was inserted and   uterine survey was performed with the ultrasound.  Sonata fibroid ablation was then performed on the mid posterior fibroid measuring 3.5cm - 4cm.  This was a type 2 fibroid at 6 o'clock.  Two ablation cycles were performed.  The first was of a 2.6cm x 2.8cm portion of the fibroid for 4 minutes and 18 seconds.  A second ablation was performed of another 3.6cm x 2.8cm portion of fibroid again for 4 minutes and 18 seconds.  All cycles were carried out under dire tc ultrasound intrauterine guidance and visualization with the ablation guide noted to be within the serosa at all times.  The fibroid treated appeared ablated with ultrasound guidance with out gassing noted by ultrasound appearence.   Once this was complete, the Sonata handpiece was removed.  Then the hysteroscope was obtained.  Using the XL device the fibroid was resected to remove as much of it as possible to prevent significant tissue sloughing.  Once this was complete, a #1 smooth curette was used to curette the endometrium until a rough gritty texture was noted in all quadrants.   This tissue was handed off with the myometrial tissue and sent to pathology.  At this point, the procedure was complete.  The hysteroscope was removed.  Fluid deficit of LR was 133ml.  The tenaculum was removed from the anterior lip of the cervix. The speculum was removed from the vagina. The prep was cleansed of the patient's skin. The legs are positioned back in the supine position. Sponge, lap, needle, initially counts were correct x2. Patient was  taken to recovery in stable condition.  COUNTS:  YES  PLAN OF CARE: Transfer to PACU

## 2021-08-19 NOTE — H&P (Signed)
[name] is an 51 y.o. female G69P2 MAA female here for RFA treatment of fibroid, hysteroscopy with D&C and possible resection of submucosal component of fibroid.  She has received four iron infusions due to her menorrhagia.  She does not want a hysterectomy at this time and desires treatment for her bleeding.  We have discussed options and she has decided to proceed with the above.  Risks, benefits, alternatives have been discussed.  Pertinent Gynecological History: Menses:  menorrhagia Contraception:  BTL DES exposure: denies Blood transfusions: none Sexually transmitted diseases: no past history Previous GYN Procedures:  none   Last mammogram: normal Date: 10/2020 Last pap: normal Date: 10/2019 OB History: G4, P2   Past Medical History:  Diagnosis Date   Anemia    Arthritis    Phreesia 03/29/2020   Back pain    DDD (degenerative disc disease), lumbar    Diabetes mellitus without complication ([place])    Phreesia 03/29/2020   Essential hypertension, benign    GERD (gastroesophageal reflux disease)    Hypertension    Phreesia 03/29/2020   Joint pain    Lactose intolerance    Lumbar herniated disc    Migraine headache 2007   Obesity    Since childhood    Osteoarthritis    Pneumonia 2007   PONV (postoperative nausea and vomiting)    Patient states she woke up during tubal ligation and has had PONV in the past.   Sacroiliac joint disease    Spinal disease    Supraventricular tachycardia ([place])    Status post RFA July 2015 - Dr. Lovena Le   Type 2 diabetes mellitus ([place])    Varicose vein of leg    Vitamin D deficiency    Weight loss    Hopitalized 4 years ago after loosing 200lb on a supervised program, malnurished with severe protein deficiency     Past Surgical History:  Procedure Laterality Date   ABLATION  12-28-2013   slow pathway modification of AVNRT by Dr Lovena Le   COLONOSCOPY N/A 11/17/2019   Procedure: COLONOSCOPY;  Surgeon: Daneil Dolin, MD;  Location: [facility]  ENDO SUITE;  Service: Endoscopy;  Laterality: N/A;  7:30   ELECTROPHYSIOLOGY STUDY N/A 12/28/2013   Procedure: ELECTROPHYSIOLOGY STUDY;  Surgeon: Evans Lance, MD;  Location: [facility] CATH LAB;  Service: Cardiovascular;  Laterality: N/A;   SUPRAVENTRICULAR TACHYCARDIA ABLATION N/A 12/28/2013   Procedure: SUPRAVENTRICULAR TACHYCARDIA ABLATION;  Surgeon: Evans Lance, MD;  Location: [facility] CATH LAB;  Service: Cardiovascular;  Laterality: N/A;   TUBAL LIGATION  1996    Family History  Problem Relation Age of Onset   Hypertension Mother    Diabetes Mother    Cancer Mother        Uterine    Heart failure Mother        CABG   Hyperlipidemia Mother    Heart disease Mother    Thyroid disease Mother    Cancer Father 40       Prostate    Hypertension Father    Bradycardia Father        Irregular heart beat    Hyperlipidemia Father    Kidney disease Father    Diabetes Brother    Diabetes Brother    Hypertension Brother    Hypertension Brother     Social History:  reports that she quit smoking about 9 years ago. Her smoking use included cigarettes. She has a 3.50 pack-year smoking history. She has never used smokeless tobacco. She  reports that she does not drink alcohol and does not use drugs.  Allergies:  Allergies  Allergen Reactions   Codeine Nausea And Vomiting   Metoclopramide Hcl Other (See Comments)    Not in right state of mind    Medications Prior to Admission  Medication Sig Dispense Refill Last Dose   acetaminophen (TYLENOL) 500 MG tablet Take 1,000 mg by mouth in the morning and at bedtime.   08/19/2021 at 0930   cyclobenzaprine (FLEXERIL) 10 MG tablet TAKE 1 TABLET BY MOUTH AT BEDTIME 90 tablet 0 08/18/2021   Diclofenac-miSOPROStol 50-0.2 MG TBEC TAKE 1 TABLET BY MOUTH TWICE DAILY AFTER A MEAL 60 tablet 3 Past Week   famotidine (PEPCID) 40 MG tablet TAKE ONE TABLET ONCE DAILY WHEN YOU TAKE IBUPROFEN (Patient taking differently: Take 40 mg by mouth daily.) 90 tablet 0 08/19/2021 at  0930   ferrous sulfate 325 (65 FE) MG tablet Take 325 mg by mouth every other day.   Past Week   losartan-hydrochlorothiazide (HYZAAR) 50-12.5 MG tablet TAKE 1 TABLET BY MOUTH ONCE DAILY 90 tablet 3 08/18/2021   rosuvastatin (CRESTOR) 10 MG tablet Take one tablet by mouth twice weekly (Wed/Sun) 8 tablet 0 Past Week   Semaglutide, 1 MG/DOSE, 4 MG/3ML SOPN Inject 1 mg into the skin once a week. 9 mL 0 Past Week   Vitamin D, Ergocalciferol, (DRISDOL) 1.25 MG (50000 UNIT) CAPS capsule Take 1 capsule by mouth every 7 days. 8 capsule 0 Past Week   HYDROcodone-acetaminophen (NORCO/VICODIN) 5-325 MG tablet Take 1 tablet by mouth every 6 (six) hours as needed for moderate pain. 30 tablet 0 More than a month    Review of Systems  All other systems reviewed and are negative.  Blood pressure 121/70, pulse 66, temperature 97.6 F (36.4 C), temperature source Oral, resp. rate 17, height 5\' 11"  (1.803 m), weight 78 kg, SpO2 100 %. Physical Exam Constitutional:      Appearance: Normal appearance.  Cardiovascular:     Rate and Rhythm: Normal rate and regular rhythm.  Pulmonary:     Effort: Pulmonary effort is normal.     Breath sounds: Normal breath sounds.  Neurological:     General: No focal deficit present.     Mental Status: She is alert.  Psychiatric:        Mood and Affect: Mood normal.    Results for orders placed or performed during the hospital encounter of 08/19/21 (from the past 24 hour(s))  Glucose, capillary     Status: Abnormal   Collection Time: 08/19/21 11:27 AM  Result Value Ref Range   Glucose-Capillary 67 (L) 70 - 99 mg/dL  ABO/Rh     Status: None   Collection Time: 08/19/21 11:30 AM  Result Value Ref Range   ABO/RH(D)      B POS Performed at [facility] [address], [place], [place] [address]   Pregnancy, urine POC     Status: None   Collection Time: 08/19/21 11:51 AM  Result Value Ref Range   Preg Test, Ur NEGATIVE NEGATIVE  Glucose, capillary     Status:  Abnormal   Collection Time: 08/19/21  1:40 PM  Result Value Ref Range   Glucose-Capillary 42 (LL) 70 - 99 mg/dL   Comment 1 Notify RN   Glucose, capillary     Status: None   Collection Time: 08/19/21  2:08 PM  Result Value Ref Range   Glucose-Capillary 79 70 - 99 mg/dL    No  results found.  Assessment/Plan: 51 yo G2P2 MAAF here for RFA treatment of fibroid.  Will also do a D&C today and resect any fibroid that is submucosal.  Pt here and ready to proceed.  Megan Salon 08/19/2021, 2:45 PM

## 2021-08-19 NOTE — OR Nursing (Signed)
200 ml urine out by in and out catheter prior to procedure start.

## 2021-08-20 ENCOUNTER — Encounter (HOSPITAL_COMMUNITY): Payer: Self-pay | Admitting: Obstetrics & Gynecology

## 2021-08-21 LAB — SURGICAL PATHOLOGY

## 2021-08-22 ENCOUNTER — Other Ambulatory Visit: Payer: Self-pay

## 2021-08-22 DIAGNOSIS — M79642 Pain in left hand: Secondary | ICD-10-CM | POA: Diagnosis present

## 2021-08-22 DIAGNOSIS — M25532 Pain in left wrist: Secondary | ICD-10-CM | POA: Insufficient documentation

## 2021-08-22 DIAGNOSIS — I10 Essential (primary) hypertension: Secondary | ICD-10-CM | POA: Insufficient documentation

## 2021-08-22 DIAGNOSIS — M7989 Other specified soft tissue disorders: Secondary | ICD-10-CM | POA: Diagnosis not present

## 2021-08-22 DIAGNOSIS — Z79899 Other long term (current) drug therapy: Secondary | ICD-10-CM | POA: Insufficient documentation

## 2021-08-22 NOTE — ED Triage Notes (Signed)
Left dorsal hand pain and swelling. Pt had IV in this area recently due to surgery. Pt also reports her arm is swollen. Denies fever. ?

## 2021-08-23 ENCOUNTER — Telehealth (HOSPITAL_COMMUNITY): Payer: Self-pay | Admitting: Emergency Medicine

## 2021-08-23 ENCOUNTER — Emergency Department (HOSPITAL_COMMUNITY)
Admission: EM | Admit: 2021-08-23 | Discharge: 2021-08-23 | Disposition: A | Discharge status: Home / Self Care | Payer: No Typology Code available for payment source | Attending: Emergency Medicine | Admitting: Emergency Medicine

## 2021-08-23 DIAGNOSIS — I808 Phlebitis and thrombophlebitis of other sites: Secondary | ICD-10-CM

## 2021-08-23 MED ORDER — CEPHALEXIN 500 MG PO CAPS
500.0000 mg | ORAL_CAPSULE | Freq: Once | ORAL | Status: AC
  Administered 2021-08-23: 500 mg via ORAL
  Filled 2021-08-23: qty 1

## 2021-08-23 MED ORDER — CEPHALEXIN 500 MG PO CAPS: 500.0000 mg | ORAL_CAPSULE | Freq: Four times a day (QID) | ORAL | 0 refills | Status: DC

## 2021-08-23 NOTE — ED Provider Notes (Signed)
?[facility] ?Provider Note ? ? ?CSN: [id number] ?Arrival date & time: 08/22/21  2059 ? ?  ? ?History ? ?Chief Complaint  ?Patient presents with  ? Arm Pain  ? ? ?[name] is a 51 y.o. female. ? ?Patient is a 51 year old female presenting with complaints of left hand and wrist pain and swelling.  This has worsened over the past 2 days.  Patient had a peripheral IV placed in her hand through an outpatient procedure last week prior to the onset of symptoms.  She denies any chest pain or difficulty breathing.  She denies any fevers or chills. ? ?The history is provided by the patient.  ? ?  ? ?Home Medications ?Prior to Admission medications   ?Medication Sig Start Date End Date Taking? Authorizing Provider  ?acetaminophen (TYLENOL) 500 MG tablet Take 1,000 mg by mouth in the morning and at bedtime.    [provider]  ?cyclobenzaprine (FLEXERIL) 10 MG tablet TAKE 1 TABLET BY MOUTH AT BEDTIME 06/17/21 06/17/22  Fayrene Helper, MD  ?Diclofenac-miSOPROStol 50-0.2 MG TBEC TAKE 1 TABLET BY MOUTH TWICE DAILY AFTER A MEAL 11/27/20 11/27/21  Jessy Oto, MD  ?famotidine (PEPCID) 40 MG tablet TAKE ONE TABLET ONCE DAILY WHEN YOU TAKE IBUPROFEN ?Patient taking differently: Take 40 mg by mouth daily. 05/28/21 05/28/22  Renee Rival, FNP  ?ferrous sulfate 325 (65 FE) MG tablet Take 325 mg by mouth every other day.    [provider]  ?HYDROcodone-acetaminophen (NORCO/VICODIN) 5-325 MG tablet Take 1 tablet by mouth every 6 (six) hours as needed for moderate pain. 04/16/21   Carole Civil, MD  ?HYDROcodone-acetaminophen (NORCO/VICODIN) 5-325 MG tablet Take 1 tablet by mouth every 4 (four) hours as needed for moderate pain. 08/19/21   Megan Salon, MD  ?ibuprofen (ADVIL) 800 MG tablet Take 1 tablet (800 mg total) by mouth every 8 (eight) hours as needed. 08/19/21   Megan Salon, MD  ?losartan-hydrochlorothiazide [facility]) 50-12.5 MG tablet TAKE 1 TABLET BY MOUTH ONCE  DAILY 01/20/21 01/20/22  Fayrene Helper, MD  ?rosuvastatin (CRESTOR) 10 MG tablet Take one tablet by mouth twice weekly (Wed/Sun) 08/18/21   Danford, Berna Spare, NP  ?Semaglutide, 1 MG/DOSE, 4 MG/3ML SOPN Inject 1 mg into the skin once a week. 08/18/21   Esaw Grandchild, NP  ?Vitamin D, Ergocalciferol, (DRISDOL) 1.25 MG (50000 UNIT) CAPS capsule Take 1 capsule by mouth every 7 days. 08/18/21   Esaw Grandchild, NP  ?   ? ?Allergies    ?Codeine and Metoclopramide hcl   ? ?Review of Systems   ?Review of Systems  ?All other systems reviewed and are negative. ? ?Physical Exam ?Updated Vital Signs ?BP 122/81 (BP Location: Right Arm)   Pulse 62   Temp (!) 97.5 ?F (36.4 ?C) (Oral)   Resp 18   SpO2 100%  ?Physical Exam ?Vitals and nursing note reviewed.  ?Constitutional:   ?   General: She is not in acute distress. ?   Appearance: Normal appearance. She is not ill-appearing.  ?HENT:  ?   Head: Normocephalic and atraumatic.  ?Pulmonary:  ?   Effort: Pulmonary effort is normal.  ?Musculoskeletal:  ?   Comments: There is mild swelling to the soft tissues of the dorsum of the left hand extending up into the left wrist.  There is mild erythema, but no warmth.  Capillary refill is brisk to all fingers and motor and sensation are intact throughout the entire hand.  ?  Skin: ?   General: Skin is warm and dry.  ?Neurological:  ?   Mental Status: She is alert and oriented to person, place, and time.  ? ? ?ED Results / Procedures / Treatments   ?Labs ?(all labs ordered are listed, but only abnormal results are displayed) ?Labs Reviewed - No data to display ? ?EKG ?None ? ?Radiology ?No results found. ? ?Procedures ?Procedures  ? ? ?Medications Ordered in ED ?Medications - No data to display ? ?ED Course/ Medical Decision Making/ A&P ? ?Patient presenting with hand pain and swelling that began after having a peripheral IV placed earlier this week.  Presentation most consistent with a superficial phlebitis.  I will treat with Keflex and warm  compresses.  Patient will return in the morning for an ultrasound to rule out DVT. ? ?Final Clinical Impression(s) / ED Diagnoses ?Final diagnoses:  ?None  ? ? ?Rx / DC Orders ?ED Discharge Orders   ? ? None  ? ?  ? ? ?  ?Veryl Speak, MD ?08/23/21 0300 ? ?

## 2021-08-23 NOTE — Telephone Encounter (Signed)
Ultrasound order has been placed, evidently was not placed during visit last night ?

## 2021-08-23 NOTE — Discharge Instructions (Signed)
Begin taking Keflex as prescribed. ? ?Elevate your arm and apply warm compresses as frequently as possible. ? ?Return this morning at the given time for an ultrasound of your arm to rule out a blood clot. ?

## 2021-08-24 ENCOUNTER — Ambulatory Visit (HOSPITAL_COMMUNITY)
Admission: RE | Admit: 2021-08-24 | Discharge: 2021-08-24 | Disposition: A | Discharge status: Home / Self Care | Payer: No Typology Code available for payment source | Source: Ambulatory Visit | Attending: Emergency Medicine | Admitting: Emergency Medicine

## 2021-08-24 DIAGNOSIS — I808 Phlebitis and thrombophlebitis of other sites: Secondary | ICD-10-CM | POA: Insufficient documentation

## 2021-08-24 NOTE — ED Provider Notes (Signed)
Patient returned to ED for [place] to rule out DVT. Seen yesterday for left hand edema. [place] negative for DVT. Showed superficial thrombus/superficial thrombophlebitis. Advised patient to do warm compresses and continue Keflex as prescribed by previous provider. Follow-up with PCP within 1 week. Strict ED precautions discussed with patient. Patient states understanding and agrees to plan. Patient discharged home in no acute distress and stable vitals ?  ?  ?Suzy Bouchard, PA-C ?08/24/21 1028 ? ?  ?Noemi Chapel, MD ?08/25/21 2030 ? ?

## 2021-08-25 ENCOUNTER — Encounter: Payer: Self-pay | Admitting: Family Medicine

## 2021-08-26 ENCOUNTER — Telehealth: Payer: Self-pay | Admitting: Family Medicine

## 2021-08-26 ENCOUNTER — Other Ambulatory Visit (HOSPITAL_BASED_OUTPATIENT_CLINIC_OR_DEPARTMENT_OTHER): Payer: Self-pay | Admitting: Obstetrics & Gynecology

## 2021-08-26 DIAGNOSIS — N921 Excessive and frequent menstruation with irregular cycle: Secondary | ICD-10-CM

## 2021-08-26 DIAGNOSIS — D250 Submucous leiomyoma of uterus: Secondary | ICD-10-CM

## 2021-08-26 NOTE — Telephone Encounter (Signed)
Virtual visit scheduled.  

## 2021-08-26 NOTE — Telephone Encounter (Signed)
Patient called in regard to ER visit on 3/5  ? ?Patient states the [facility] prescribes is making her sick. ? ?Patient would like a call back in regard. Wants to see if she can get something else sent in. ?

## 2021-08-28 ENCOUNTER — Other Ambulatory Visit: Payer: Self-pay

## 2021-08-28 ENCOUNTER — Ambulatory Visit (INDEPENDENT_AMBULATORY_CARE_PROVIDER_SITE_OTHER): Payer: No Typology Code available for payment source | Admitting: Family Medicine

## 2021-08-28 DIAGNOSIS — R110 Nausea: Secondary | ICD-10-CM | POA: Diagnosis not present

## 2021-08-28 DIAGNOSIS — T801XXD Vascular complications following infusion, transfusion and therapeutic injection, subsequent encounter: Secondary | ICD-10-CM

## 2021-08-28 DIAGNOSIS — I809 Phlebitis and thrombophlebitis of unspecified site: Secondary | ICD-10-CM

## 2021-08-28 DIAGNOSIS — T50905A Adverse effect of unspecified drugs, medicaments and biological substances, initial encounter: Secondary | ICD-10-CM

## 2021-08-28 MED ORDER — SULFAMETHOXAZOLE-TRIMETHOPRIM 800-160 MG PO TABS: 1.0000 | ORAL_TABLET | Freq: Two times a day (BID) | ORAL | 0 refills | Status: DC

## 2021-08-28 MED ORDER — ONDANSETRON HCL 4 MG PO TABS: 4.0000 mg | ORAL_TABLET | Freq: Three times a day (TID) | ORAL | 0 refills | Status: DC | PRN

## 2021-08-28 NOTE — Progress Notes (Signed)
Virtual Visit via Telephone Note ? ?I connected with [name] on 08/28/21 at  1:40 PM EST by telephone and verified that I am speaking with the correct person using two identifiers. ? ?Location: ?Patient: hom,e ?Provider: office ?  ?I discussed the limitations, risks, security and privacy concerns of performing an evaluation and management service by telephone and the availability of in person appointments. I also discussed with the patient that there may be a patient responsible charge related to this service. The patient expressed understanding and agreed to proceed. ? ? ?History of Present Illness: ? ? reports complications following recent out pt surgical procedure for submucosal fibroids on 08/19/2021 which resulted in loss of a tooth and superficial phlebitis. Treated initially by UC with keflex, however reports intolerance with severe nausea ?States inflammation is improving ?Observations/Objective: ?There were no vitals taken for this visit. ?Good communication with no confusion and intact memory. ?Alert and oriented x 3 ?No signs of respiratory distress during speech ? ? ?Assessment and Plan: ? ?Phlebitis after infusion ?sptra prescribed in place of keflex due to intolerance advised to take entire course ? ?Nausea ?zofran prescribed for as needed use ? ?Adverse drug reaction ?Reports excess nausea with qid keflex, will add to record, antibiotic changed to twice daily septra ? ?Follow Up Instructions: ? ?  ?I discussed the assessment and treatment plan with the patient. The patient was provided an opportunity to ask questions and all were answered. The patient agreed with the plan and demonstrated an understanding of the instructions. ?  ?The patient was advised to call back or seek an in-person evaluation if the symptoms worsen or if the condition fails to improve as anticipated. ? ?I provided 13 minutes of non-face-to-face time during this encounter. ? ? ?Tula Nakayama, MD ? ?

## 2021-08-28 NOTE — Patient Instructions (Signed)
F/u as before, call if you eed me sooner ? ?New for phlebitis is septra twice daily for 1 week and zofran is also prescribed for nausea ? ?Thanks for choosing [facility], we consider it a privelige to serve you. ? ?

## 2021-08-31 ENCOUNTER — Encounter: Payer: Self-pay | Admitting: Family Medicine

## 2021-08-31 DIAGNOSIS — T50905A Adverse effect of unspecified drugs, medicaments and biological substances, initial encounter: Secondary | ICD-10-CM | POA: Insufficient documentation

## 2021-08-31 DIAGNOSIS — T801XXA Vascular complications following infusion, transfusion and therapeutic injection, initial encounter: Secondary | ICD-10-CM | POA: Insufficient documentation

## 2021-08-31 DIAGNOSIS — R110 Nausea: Secondary | ICD-10-CM | POA: Insufficient documentation

## 2021-08-31 DIAGNOSIS — I809 Phlebitis and thrombophlebitis of unspecified site: Secondary | ICD-10-CM | POA: Insufficient documentation

## 2021-08-31 NOTE — Assessment & Plan Note (Signed)
Reports excess nausea with qid keflex, will add to record, antibiotic changed to twice daily septra ?

## 2021-08-31 NOTE — Assessment & Plan Note (Signed)
sptra prescribed in place of keflex due to intolerance advised to take entire course ?

## 2021-08-31 NOTE — Assessment & Plan Note (Signed)
zofran prescribed for as needed use ?

## 2021-09-01 ENCOUNTER — Encounter: Payer: Self-pay | Admitting: Specialist

## 2021-09-01 ENCOUNTER — Ambulatory Visit: Payer: No Typology Code available for payment source | Admitting: Specialist

## 2021-09-01 ENCOUNTER — Other Ambulatory Visit (HOSPITAL_COMMUNITY): Payer: Self-pay

## 2021-09-01 ENCOUNTER — Other Ambulatory Visit: Payer: Self-pay

## 2021-09-01 VITALS — BP 94/58 | HR 77 | Ht 71.0 in | Wt 172.0 lb

## 2021-09-01 DIAGNOSIS — G8929 Other chronic pain: Secondary | ICD-10-CM | POA: Diagnosis not present

## 2021-09-01 DIAGNOSIS — M75111 Incomplete rotator cuff tear or rupture of right shoulder, not specified as traumatic: Secondary | ICD-10-CM | POA: Diagnosis not present

## 2021-09-01 DIAGNOSIS — M7541 Impingement syndrome of right shoulder: Secondary | ICD-10-CM

## 2021-09-01 DIAGNOSIS — M25511 Pain in right shoulder: Secondary | ICD-10-CM | POA: Diagnosis not present

## 2021-09-01 MED ORDER — NAPROXEN 500 MG PO TABS
500.0000 mg | ORAL_TABLET | Freq: Two times a day (BID) | ORAL | 1 refills | Status: DC
  Filled 2021-09-01: qty 60, 30d supply, fill #0

## 2021-09-01 NOTE — Patient Instructions (Signed)
Avoid overhead lifting and overhead use of the arms. ?Pillows to keep from sleeping directly on the shoulders ?Limited lifting to less than 10 lbs. ?Ice or heat for relief. ?NSAIDs are helpful, such as alleve or motrin, be careful not to use in excess as they place burdens on the kidney. ?Stretching exercise help and strengthening is helpful to build endurance.  ? ?Please schedule an appointment with Dr. Marlou Sa for consideration of concen plt inject ?

## 2021-09-01 NOTE — Progress Notes (Signed)
Office Visit Note   Patient: [name]           Date of Birth: August 10, 1970           MRN: [id number] Visit Date: 09/01/2021              Requested by: Fayrene Helper, [address], [place],  [place] [address] PCP: Fayrene Helper, MD   Assessment & Plan: Visit Diagnoses:  1. Shoulder impingement, right   2. Chronic right shoulder pain   3. Incomplete tear of right rotator cuff, unspecified whether traumatic     Plan: Avoid overhead lifting and overhead use of the arms. Pillows to keep from sleeping directly on the shoulders Limited lifting to less than 10 lbs. Ice or heat for relief. NSAIDs are helpful, such as alleve or motrin, be careful not to use in excess as they place burdens on the kidney. Stretching exercise help and strengthening is helpful to build endurance.   Please schedule an appointment with Dr. Marlou Sa for consideration of concentrated platelet injection or arthroscopic evaluation of the right shoulder.  Follow-Up Instructions: Return in about 2 weeks (around 09/15/2021) for Please schedule an appointment with Dr. Marlou Sa for consideration of concen plt inject.   Orders:  No orders of the defined types were placed in this encounter.  Meds ordered this encounter  Medications   naproxen (NAPROSYN) 500 MG tablet    Sig: Take 1 tablet (500 mg total) by mouth 2 (two) times daily with a meal.    Dispense:  60 tablet    Refill:  1      Procedures: No procedures performed   Clinical Data: No additional findings.   Subjective: Chief Complaint  Patient presents with   Right Shoulder - Injury, Follow-up, Pain    01/2021 she was getting out of her daughters low honda, and she pulled up to get out and hurt her shoulder, went to UC and then to her PCP who referred her to Dr. Aline Brochure, MRI has been done injection did not help, wants Dr. Otho Ket opinion, can't lay on right side, pain will shoot from shoulder down the are, trouble  undressing, aches mostly at night    51year old right handed female with 7 month history of right shoulder pain due to injury that occurred when she was getting herself out of a small car and pushed up with the right shoulder with acute pain difficulty with over head use of  the arm and with raising the arm and Sleeping on the arm. Has had multiple injections by Dr. Aline Brochure at [facility] but pain is persisting and she is still having difficulty  Sleeping on the shoulder and raising overhead for washing hair or reaching.    Review of Systems  Constitutional: Negative.   HENT: Negative.    Eyes: Negative.   Respiratory: Negative.    Cardiovascular: Negative.   Gastrointestinal: Negative.   Endocrine: Negative.   Genitourinary: Negative.   Musculoskeletal: Negative.   Skin: Negative.   Allergic/Immunologic: Negative.   Neurological: Negative.   Hematological: Negative.   Psychiatric/Behavioral: Negative.      Objective: Vital Signs: BP (!) 94/58 (BP Location: Left Arm, Patient Position: Sitting)    Pulse 77    Ht '5\' 11"'$  (1.803 m)    Wt 172 lb (78 kg)    BMI 23.99 kg/m   Physical Exam Constitutional:      Appearance: She is well-developed.  HENT:  Head: Normocephalic and atraumatic.  Eyes:     Pupils: Pupils are equal, round, and reactive to light.  Pulmonary:     Effort: Pulmonary effort is normal.     Breath sounds: Normal breath sounds.  Abdominal:     General: Bowel sounds are normal.     Palpations: Abdomen is soft.  Musculoskeletal:     Cervical back: Normal range of motion and neck supple.  Skin:    General: Skin is warm and dry.  Neurological:     Mental Status: She is alert and oriented to person, place, and time.  Psychiatric:        Behavior: Behavior normal.        Thought Content: Thought content normal.        Judgment: Judgment normal.    Right Shoulder Exam   Tenderness  The patient is experiencing tenderness in the acromion.  Range of  Motion  Active abduction:  abnormal  Passive abduction:  abnormal  Extension:  normal  External rotation:  normal  Forward flexion:  normal  Internal rotation 0 degrees:  abnormal  Internal rotation 90 degrees:  abnormal   Muscle Strength  Abduction: 4/5  Internal rotation: 4/5  External rotation: 5/5  Supraspinatus: 4/5  Subscapularis: 5/5  Biceps: 5/5   Tests  Apprehension: positive Impingement: positive  Other  Erythema: absent Sensation: normal Pulse: present     Specialty Comments:  No specialty comments available.  Imaging: No results found.   PMFS History: Patient Active Problem List   Diagnosis Date Noted   Phlebitis after infusion 08/31/2021   Adverse drug reaction 08/31/2021   Nausea 08/31/2021   Fibroids, submucosal 06/17/2021   Diabetes mellitus ([place]) 05/26/2021   Vitamin D deficiency 05/26/2021   Overweight (BMI 25.0-29.9) 05/26/2021   Shoulder pain, right 02/12/2021   Peri-menopause 01/20/2021   Menorrhagia with irregular cycle 01/20/2021   Pre-diabetes 11/19/2020   Absolute anemia 11/19/2020   Iron deficiency anemia due to chronic blood loss 10/10/2020   Abnormal CT scan, pelvis 04/01/2020   Symptomatic varicose veins, bilateral 09/01/2018   Dyspepsia 01/18/2017   Back pain with sciatica 09/20/2014   Solitary lung nodule 09/05/2014   Hyperlipidemia associated with type 2 diabetes mellitus ([place]) 01/26/2014   Encounter for annual physical exam 04/18/2012   Allergic rhinitis 09/08/2010   OTH ABNORMAL BRAIN & CNS FUNCTION STUDY 02/16/2010   GOITER, UNSPECIFIED 02/10/2010   Class 1 obesity with serious comorbidity and body mass index (BMI) of 30.0 to 30.9 in adult 02/10/2010   Essential hypertension 05/02/2009   Past Medical History:  Diagnosis Date   Anemia    Arthritis    Phreesia 03/29/2020   Back pain    DDD (degenerative disc disease), lumbar    Diabetes mellitus without complication ([place])    Phreesia 03/29/2020   Essential  hypertension, benign    GERD (gastroesophageal reflux disease)    Hypertension    Phreesia 03/29/2020   Joint pain    Lactose intolerance    Lumbar herniated disc    Migraine headache 2007   Obesity    Since childhood    Osteoarthritis    Pneumonia 2007   PONV (postoperative nausea and vomiting)    Patient states she woke up during tubal ligation and has had PONV in the past.   Sacroiliac joint disease    Spinal disease    Supraventricular tachycardia [facility])    Status post RFA July 2015 - Dr. Lovena Le   Type 2 diabetes  mellitus ([place])    Varicose vein of leg    Vitamin D deficiency    Weight loss    Hopitalized 4 years ago after loosing 200lb on a supervised program, malnurished with severe protein deficiency     Family History  Problem Relation Age of Onset   Hypertension Mother    Diabetes Mother    Cancer Mother        Uterine    Heart failure Mother        CABG   Hyperlipidemia Mother    Heart disease Mother    Thyroid disease Mother    Cancer Father 57       Prostate    Hypertension Father    Bradycardia Father        Irregular heart beat    Hyperlipidemia Father    Kidney disease Father    Diabetes Brother    Diabetes Brother    Hypertension Brother    Hypertension Brother     Past Surgical History:  Procedure Laterality Date   ABLATION  12-28-2013   slow pathway modification of AVNRT by Dr Lovena Le   COLONOSCOPY N/A 11/17/2019   Procedure: COLONOSCOPY;  Surgeon: Daneil Dolin, MD;  Location: [facility];  Service: Endoscopy;  Laterality: N/A;  7:30   ELECTROPHYSIOLOGY STUDY N/A 12/28/2013   Procedure: ELECTROPHYSIOLOGY STUDY;  Surgeon: Evans Lance, MD;  Location: [facility] C[facility] LAB;  Service: Cardiovascular;  Laterality: N/A;   HYSTEROSCOPY WITH D & C N/A 08/19/2021   Procedure: DILATATION AND CURETTAGE /HYSTEROSCOPY;  Surgeon: Megan Salon, MD;  Location: [place];  Service: Gynecology;  Laterality: N/A;   SUPRAVENTRICULAR TACHYCARDIA ABLATION N/A 12/28/2013    Procedure: SUPRAVENTRICULAR TACHYCARDIA ABLATION;  Surgeon: Evans Lance, MD;  Location: [facility] CATH LAB;  Service: Cardiovascular;  Laterality: N/A;   TUBAL LIGATION  1996   Social History   Occupational History   Occupation: English as a second language teacher at [employer] Use   Smoking status: Former    Packs/day: 0.50    Years: 7.00    Pack years: 3.50    Types: Cigarettes    Quit date: 08/05/2012    Years since quitting: 9.0   Smokeless tobacco: Never   Tobacco comments:    quit in 2014  Vaping Use   Vaping Use: Never used  Substance and Sexual Activity   Alcohol use: No    Alcohol/week: 0.0 standard drinks   Drug use: No   Sexual activity: Yes    Birth control/protection: Surgical    Comment: tubal ligation

## 2021-09-04 ENCOUNTER — Other Ambulatory Visit: Payer: Self-pay | Admitting: Specialist

## 2021-09-04 ENCOUNTER — Other Ambulatory Visit (HOSPITAL_COMMUNITY): Payer: Self-pay

## 2021-09-04 ENCOUNTER — Other Ambulatory Visit: Payer: Self-pay | Admitting: Family Medicine

## 2021-09-04 MED FILL — Famotidine Tab 40 MG: ORAL | 90 days supply | Qty: 90 | Fill #0 | Status: AC

## 2021-09-10 ENCOUNTER — Other Ambulatory Visit (HOSPITAL_COMMUNITY): Payer: Self-pay

## 2021-09-16 ENCOUNTER — Ambulatory Visit: Payer: No Typology Code available for payment source

## 2021-09-16 ENCOUNTER — Other Ambulatory Visit: Payer: Self-pay

## 2021-09-16 LAB — HM DIABETES EYE EXAM

## 2021-09-17 ENCOUNTER — Other Ambulatory Visit (HOSPITAL_COMMUNITY): Payer: Self-pay

## 2021-09-17 ENCOUNTER — Encounter: Payer: Self-pay | Admitting: Orthopedic Surgery

## 2021-09-17 ENCOUNTER — Ambulatory Visit: Payer: No Typology Code available for payment source | Admitting: Orthopedic Surgery

## 2021-09-17 ENCOUNTER — Other Ambulatory Visit: Payer: Self-pay | Admitting: Family Medicine

## 2021-09-17 DIAGNOSIS — M7541 Impingement syndrome of right shoulder: Secondary | ICD-10-CM | POA: Diagnosis not present

## 2021-09-17 DIAGNOSIS — M7501 Adhesive capsulitis of right shoulder: Secondary | ICD-10-CM | POA: Diagnosis not present

## 2021-09-17 DIAGNOSIS — M5430 Sciatica, unspecified side: Secondary | ICD-10-CM

## 2021-09-17 MED ORDER — LIDOCAINE HCL 1 % IJ SOLN
5.0000 mL | INTRAMUSCULAR | Status: AC | PRN
  Administered 2021-09-17: 5 mL

## 2021-09-17 MED ORDER — METHYLPREDNISOLONE ACETATE 40 MG/ML IJ SUSP
40.0000 mg | INTRAMUSCULAR | Status: AC | PRN
  Administered 2021-09-17: 40 mg via INTRA_ARTICULAR

## 2021-09-17 MED ORDER — BUPIVACAINE HCL 0.5 % IJ SOLN
9.0000 mL | INTRAMUSCULAR | Status: AC | PRN
  Administered 2021-09-17: 9 mL via INTRA_ARTICULAR

## 2021-09-17 NOTE — Progress Notes (Addendum)
? ?Office Visit Note ?  ?Patient: [name]           ?Date of Birth: 1971-01-01           ?MRN: [id number] ?Visit Date: 09/17/2021 ?Requested by: Fayrene Helper, MD ?[address] ?[place],  [place] [address] ?PCP: Fayrene Helper, MD ? ?Subjective: ?Chief Complaint  ?Patient presents with  ? Right Shoulder - Pain  ? ? ?HPI: [name] is a 51 y.o. female who presents to the office complaining of right shoulder pain.  Patient has had chronic right shoulder pain since August 2022 when she was pulling herself out of her daughter's car and injured her shoulder.  She had injection with subsequent repeat injection by Dr. Aline Brochure at that time.  Both injections were in the subacromial space.  She describes patient describes diffuse shoulder pain that radiates down to the proximal forearm.  Pain is worse at night and cannot lay on that side.  She is right-hand dominant.  She has stiffness of the shoulder.  She sleeps with a heating pad.  She also has tried Tylenol and Flexeril at night with occasional naproxen as well without much relief of her pain.  She had an MRI of the right shoulder that did demonstrate mild tendinosis of the supraspinatus tendon with a tiny insertional interstitial tear as well as mild tendinosis of the infraspinatus tendon.  After that scan, she had another subacromial injection by Dr. Aline Brochure.  None of these injections have really provided any good relief for her.  She has been doing a home exercise program at home.  Denies any neck pain, shoulder blade pain, numbness/tingling, radicular pain into her hand.  She does have history of diabetes but her last A1c was less than 6.  No history of thyroid disease. ?             ?ROS: All systems reviewed are negative as they relate to the chief complaint within the history of present illness.  Patient denies fevers or chills. ? ?Assessment & Plan: ?Visit Diagnoses:  ?1. Shoulder impingement, right   ?2. Adhesive  capsulitis of right shoulder   ? ? ?Plan: Patient is a 51 year old female who presents for evaluation of right shoulder pain, referred by Dr. Louanne Skye.  She was referred for possible PRP injection given the findings on her MRI scan.  However, looking at the MRI today, it does seem to show some restriction of the axillary recess consistent with adhesive capsulitis.  She also demonstrates reduced range of motion of the right shoulder compared with the left on exam today.  With her continued stiffness and pain, impression is likely right shoulder adhesive capsulitis. She's had three subacromial injections without relief.  Plan is right shoulder glenohumeral injection today which patient tolerated well.  Also plan to refer to physical therapy formally for right shoulder passive range of motion and capsular stretching.  She will follow-up in 3 weeks for clinical recheck with decision point at that time to be for or against manipulation under anesthesia.  Patient agreed with plan.  Follow-up in 3 weeks.  Patient was also evaluated by Dr. Marlou Sa today. ? ?Follow-Up Instructions: Return in about 3 weeks (around 10/08/2021).  ? ?Orders:  ?Orders Placed This Encounter  ?Procedures  ? Ambulatory referral to Occupational Therapy  ? ?No orders of the defined types were placed in this encounter. ? ? ? ? Procedures: ?Large Joint Inj: R glenohumeral on 09/17/2021 12:46 PM ?Indications:  diagnostic evaluation and pain ?Details: 18 G 1.5 in needle, posterior approach ? ?Arthrogram: No ? ?Medications: 9 mL bupivacaine 0.5 %; 40 mg methylPREDNISolone acetate 40 MG/ML; 5 mL lidocaine 1 % ?Outcome: tolerated well, no immediate complications ?Procedure, treatment alternatives, risks and benefits explained, specific risks discussed. Consent was given by the patient. Immediately prior to procedure a time out was called to verify the correct patient, procedure, equipment, support staff and site/side marked as required. Patient was prepped and draped  in the usual sterile fashion.  ? ? ? ? ?Clinical Data: ?No additional findings. ? ?Objective: ?Vital Signs: There were no vitals taken for this visit. ? ?Physical Exam:  ?Constitutional: Patient appears well-developed ?HEENT:  ?Head: Normocephalic ?Eyes:EOM are normal ?Neck: Normal range of motion ?Cardiovascular: Normal rate ?Pulmonary/chest: Effort normal ?Neurologic: Patient is alert ?Skin: Skin is warm ?Psychiatric: Patient has normal mood and affect ? ?Ortho Exam: Ortho exam demonstrates right shoulder with 45 degrees external rotation, 80 degrees abduction, 140 degrees forward flexion.  This compared with the left shoulder with 70 degrees external rotation, 110 degrees abduction, 180 degrees forward flexion.  She has excellent rotator cuff strength of supraspinatus, infraspinatus, subscapularis but infraspinatus and supraspinatus strength testing do reproduce some of her pain.  She has no coarse grinding or crepitus noted with passive motion of the shoulder.  5/5 motor strength of bilateral grip strength, finger abduction, pronation/supination, bicep, tricep, deltoid.  She has negative Spurling sign.  No pain with cervical spine range of motion.  No tenderness over the axial cervical spine.  No significant tenderness over the [facility] joint.  She does have some tenderness over the bicipital groove. ? ?Specialty Comments:  ?No specialty comments available. ? ?Imaging: ?No results found. ? ? ?PMFS History: ?Patient Active Problem List  ? Diagnosis Date Noted  ? Phlebitis after infusion 08/31/2021  ? Adverse drug reaction 08/31/2021  ? Nausea 08/31/2021  ? Fibroids, submucosal 06/17/2021  ? Diabetes mellitus ([place]) 05/26/2021  ? Vitamin D deficiency 05/26/2021  ? Overweight (BMI 25.0-29.9) 05/26/2021  ? Shoulder pain, right 02/12/2021  ? Peri-menopause 01/20/2021  ? Menorrhagia with irregular cycle 01/20/2021  ? Pre-diabetes 11/19/2020  ? Absolute anemia 11/19/2020  ? Iron deficiency anemia due to chronic blood loss  10/10/2020  ? Abnormal CT scan, pelvis 04/01/2020  ? Symptomatic varicose veins, bilateral 09/01/2018  ? Dyspepsia 01/18/2017  ? Back pain with sciatica 09/20/2014  ? Solitary lung nodule 09/05/2014  ? Hyperlipidemia associated with type 2 diabetes mellitus ([place]) 01/26/2014  ? Encounter for annual physical exam 04/18/2012  ? Allergic rhinitis 09/08/2010  ? OTH ABNORMAL BRAIN & CNS FUNCTION STUDY 02/16/2010  ? GOITER, UNSPECIFIED 02/10/2010  ? Class 1 obesity with serious comorbidity and body mass index (BMI) of 30.0 to 30.9 in adult 02/10/2010  ? Essential hypertension 05/02/2009  ? ?Past Medical History:  ?Diagnosis Date  ? Anemia   ? Arthritis   ? Phreesia 03/29/2020  ? Back pain   ? DDD (degenerative disc disease), lumbar   ? Diabetes mellitus without complication ([place])   ? Phreesia 03/29/2020  ? Essential hypertension, benign   ? GERD (gastroesophageal reflux disease)   ? Hypertension   ? Phreesia 03/29/2020  ? Joint pain   ? Lactose intolerance   ? Lumbar herniated disc   ? Migraine headache 2007  ? Obesity   ? Since childhood   ? Osteoarthritis   ? Pneumonia 2007  ? PONV (postoperative nausea and vomiting)   ? Patient states she woke  up during tubal ligation and has had PONV in the past.  ? Sacroiliac joint disease   ? Spinal disease   ? Supraventricular tachycardia ([place])   ? Status post RFA July 2015 - Dr. Lovena Le  ? Type 2 diabetes mellitus ([place])   ? Varicose vein of leg   ? Vitamin D deficiency   ? Weight loss   ? Hopitalized 4 years ago after loosing 200lb on a supervised program, malnurished with severe protein deficiency   ?  ?Family History  ?Problem Relation Age of Onset  ? Hypertension Mother   ? Diabetes Mother   ? Cancer Mother   ?     Uterine   ? Heart failure Mother   ?     CABG  ? Hyperlipidemia Mother   ? Heart disease Mother   ? Thyroid disease Mother   ? Cancer Father 36  ?     Prostate   ? Hypertension Father   ? Bradycardia Father   ?     Irregular heart beat   ? Hyperlipidemia Father   ?  Kidney disease Father   ? Diabetes Brother   ? Diabetes Brother   ? Hypertension Brother   ? Hypertension Brother   ?  ?Past Surgical History:  ?Procedure Laterality Date  ? ABLATION  12-28-2013  ? slow pathway modif

## 2021-09-18 ENCOUNTER — Encounter (HOSPITAL_COMMUNITY): Payer: Self-pay | Admitting: Hematology

## 2021-09-18 ENCOUNTER — Encounter: Payer: Self-pay | Admitting: Family Medicine

## 2021-09-18 ENCOUNTER — Other Ambulatory Visit (HOSPITAL_COMMUNITY): Payer: Self-pay

## 2021-09-18 MED ORDER — CYCLOBENZAPRINE HCL 10 MG PO TABS
ORAL_TABLET | Freq: Every day | ORAL | 0 refills | Status: DC
  Filled 2021-09-18: qty 90, 90d supply, fill #0

## 2021-09-26 ENCOUNTER — Other Ambulatory Visit: Payer: Self-pay | Admitting: Specialist

## 2021-09-26 ENCOUNTER — Other Ambulatory Visit (HOSPITAL_COMMUNITY): Payer: Self-pay

## 2021-09-29 ENCOUNTER — Other Ambulatory Visit (HOSPITAL_COMMUNITY): Payer: Self-pay

## 2021-09-29 MED ORDER — DICLOFENAC-MISOPROSTOL 50-0.2 MG PO TBEC
DELAYED_RELEASE_TABLET | ORAL | 3 refills | Status: DC
  Filled 2021-09-29: qty 60, 30d supply, fill #0
  Filled 2021-12-01: qty 60, 30d supply, fill #1
  Filled 2021-12-26: qty 60, 30d supply, fill #2
  Filled 2022-01-28: qty 60, 30d supply, fill #3

## 2021-09-30 ENCOUNTER — Other Ambulatory Visit (HOSPITAL_COMMUNITY): Payer: Self-pay

## 2021-10-02 ENCOUNTER — Inpatient Hospital Stay (HOSPITAL_COMMUNITY): Payer: No Typology Code available for payment source | Attending: Hematology

## 2021-10-02 DIAGNOSIS — D509 Iron deficiency anemia, unspecified: Secondary | ICD-10-CM | POA: Diagnosis present

## 2021-10-02 DIAGNOSIS — D500 Iron deficiency anemia secondary to blood loss (chronic): Secondary | ICD-10-CM

## 2021-10-02 DIAGNOSIS — D72820 Lymphocytosis (symptomatic): Secondary | ICD-10-CM | POA: Diagnosis present

## 2021-10-02 LAB — CBC WITH DIFFERENTIAL/PLATELET
Abs Immature Granulocytes: 0.05 10*3/uL (ref 0.00–0.07)
Basophils Absolute: 0 10*3/uL (ref 0.0–0.1)
Basophils Relative: 0 %
Eosinophils Absolute: 0.1 10*3/uL (ref 0.0–0.5)
Eosinophils Relative: 2 %
HCT: 39 % (ref 36.0–46.0)
Hemoglobin: 12.9 g/dL (ref 12.0–15.0)
Immature Granulocytes: 1 %
Lymphocytes Relative: 48 %
Lymphs Abs: 3.3 10*3/uL (ref 0.7–4.0)
MCH: 30 pg (ref 26.0–34.0)
MCHC: 33.1 g/dL (ref 30.0–36.0)
MCV: 90.7 fL (ref 80.0–100.0)
Monocytes Absolute: 0.6 10*3/uL (ref 0.1–1.0)
Monocytes Relative: 8 %
Neutro Abs: 2.8 10*3/uL (ref 1.7–7.7)
Neutrophils Relative %: 41 %
Platelets: 265 10*3/uL (ref 150–400)
RBC: 4.3 MIL/uL (ref 3.87–5.11)
RDW: 13.4 % (ref 11.5–15.5)
WBC: 6.9 10*3/uL (ref 4.0–10.5)
nRBC: 0 % (ref 0.0–0.2)

## 2021-10-02 LAB — IRON AND TIBC
Iron: 42 ug/dL (ref 28–170)
Saturation Ratios: 13 % (ref 10.4–31.8)
TIBC: 317 ug/dL (ref 250–450)
UIBC: 275 ug/dL

## 2021-10-02 LAB — FERRITIN: Ferritin: 193 ng/mL (ref 11–307)

## 2021-10-06 ENCOUNTER — Other Ambulatory Visit (HOSPITAL_COMMUNITY): Payer: Self-pay | Admitting: Family Medicine

## 2021-10-06 DIAGNOSIS — Z1231 Encounter for screening mammogram for malignant neoplasm of breast: Secondary | ICD-10-CM

## 2021-10-07 ENCOUNTER — Ambulatory Visit (INDEPENDENT_AMBULATORY_CARE_PROVIDER_SITE_OTHER): Payer: No Typology Code available for payment source | Admitting: Adult Health

## 2021-10-07 ENCOUNTER — Other Ambulatory Visit (HOSPITAL_COMMUNITY): Payer: Self-pay

## 2021-10-07 VITALS — BP 102/62 | HR 67 | Temp 98.1°F | Ht 71.0 in | Wt 169.0 lb

## 2021-10-07 DIAGNOSIS — Z6823 Body mass index (BMI) 23.0-23.9, adult: Secondary | ICD-10-CM

## 2021-10-07 DIAGNOSIS — E559 Vitamin D deficiency, unspecified: Secondary | ICD-10-CM | POA: Diagnosis not present

## 2021-10-07 DIAGNOSIS — E6609 Other obesity due to excess calories: Secondary | ICD-10-CM

## 2021-10-07 DIAGNOSIS — E1169 Type 2 diabetes mellitus with other specified complication: Secondary | ICD-10-CM | POA: Diagnosis not present

## 2021-10-07 DIAGNOSIS — E785 Hyperlipidemia, unspecified: Secondary | ICD-10-CM | POA: Diagnosis not present

## 2021-10-07 DIAGNOSIS — E669 Obesity, unspecified: Secondary | ICD-10-CM | POA: Diagnosis not present

## 2021-10-07 DIAGNOSIS — Z7985 Long-term (current) use of injectable non-insulin antidiabetic drugs: Secondary | ICD-10-CM

## 2021-10-07 MED ORDER — VITAMIN D (ERGOCALCIFEROL) 1.25 MG (50000 UNIT) PO CAPS
50000.0000 [IU] | ORAL_CAPSULE | ORAL | 0 refills | Status: DC
  Filled 2021-10-07: qty 8, 56d supply, fill #0

## 2021-10-07 MED ORDER — ROSUVASTATIN CALCIUM 10 MG PO TABS
ORAL_TABLET | ORAL | 0 refills | Status: DC
  Filled 2021-10-07: qty 8, 28d supply, fill #0

## 2021-10-07 MED ORDER — SEMAGLUTIDE (1 MG/DOSE) 4 MG/3ML ~~LOC~~ SOPN
1.0000 mg | PEN_INJECTOR | SUBCUTANEOUS | 0 refills | Status: DC
  Filled 2021-10-07: qty 9, 84d supply, fill #0

## 2021-10-08 ENCOUNTER — Ambulatory Visit: Payer: No Typology Code available for payment source | Admitting: Orthopedic Surgery

## 2021-10-13 ENCOUNTER — Ambulatory Visit (HOSPITAL_COMMUNITY): Payer: No Typology Code available for payment source | Attending: Orthopedic Surgery

## 2021-10-13 ENCOUNTER — Encounter (HOSPITAL_COMMUNITY): Payer: Self-pay

## 2021-10-13 DIAGNOSIS — R29898 Other symptoms and signs involving the musculoskeletal system: Secondary | ICD-10-CM | POA: Diagnosis present

## 2021-10-13 DIAGNOSIS — M25611 Stiffness of right shoulder, not elsewhere classified: Secondary | ICD-10-CM | POA: Diagnosis present

## 2021-10-13 DIAGNOSIS — M25511 Pain in right shoulder: Secondary | ICD-10-CM | POA: Diagnosis present

## 2021-10-13 NOTE — Patient Instructions (Signed)

## 2021-10-13 NOTE — Therapy (Signed)
?OUTPATIENT OCCUPATIONAL THERAPY ORTHO EVALUATION ? ?Patient Name: [name] ?MRN: [id number] ?DOB:May 26, 1971, 51 y.o., female ?Today's Date: 10/13/2021 ? ?PCP: Fayrene Helper, MD ?REFERRING PROVIDER: Meredith Pel, MD ? ? OT End of Session - 10/13/21 1212   ? ? Visit Number 1   ? Number of Visits 6   ? Date for OT Re-Evaluation 11/03/21   ? Authorization Type [employer]   ? Authorization Time Period $40 copay. 25 visit limit OT. Authorization required after 12th visit. 0 visits used.   ? Progress Note Due on Visit 12   ? OT Start Time 1115   ? OT Stop Time 1153   ? OT Time Calculation (min) 38 min   ? Activity Tolerance Patient tolerated treatment well   ? Behavior During Therapy [facility] for tasks assessed/performed   ? ?  ?  ? ?  ? ? ?Past Medical History:  ?Diagnosis Date  ? Anemia   ? Arthritis   ? Phreesia 03/29/2020  ? Back pain   ? DDD (degenerative disc disease), lumbar   ? Diabetes mellitus without complication ([place])   ? Phreesia 03/29/2020  ? Essential hypertension, benign   ? GERD (gastroesophageal reflux disease)   ? Hypertension   ? Phreesia 03/29/2020  ? Joint pain   ? Lactose intolerance   ? Lumbar herniated disc   ? Migraine headache 2007  ? Obesity   ? Since childhood   ? Osteoarthritis   ? Pneumonia 2007  ? PONV (postoperative nausea and vomiting)   ? Patient states she woke up during tubal ligation and has had PONV in the past.  ? Sacroiliac joint disease   ? Spinal disease   ? Supraventricular tachycardia ([place])   ? Status post RFA July 2015 - Dr. Lovena Le  ? Type 2 diabetes mellitus ([place])   ? Varicose vein of leg   ? Vitamin D deficiency   ? Weight loss   ? Hopitalized 4 years ago after loosing 200lb on a supervised program, malnurished with severe protein deficiency   ? ?Past Surgical History:  ?Procedure Laterality Date  ? ABLATION  12-28-2013  ? slow pathway modification of AVNRT by Dr Lovena Le  ? COLONOSCOPY N/A 11/17/2019  ? Procedure: COLONOSCOPY;  Surgeon: Daneil Dolin, MD;   [facility];  Service: Endoscopy;  Laterality: N/A;  7:30  ? ELECTROPHYSIOLOGY STUDY N/A 12/28/2013  ? Procedure: ELECTROPHYSIOLOGY STUDY;  Surgeon: Evans Lance, MD;  Location: [facility] CATH LAB;  Service: Cardiovascular;  Laterality: N/A;  ? HYSTEROSCOPY WITH D & C N/A 08/19/2021  ? Procedure: DILATATION AND CURETTAGE /HYSTEROSCOPY;  Surgeon: Megan Salon, MD;  Location: [place];  Service: Gynecology;  Laterality: N/A;  ? SUPRAVENTRICULAR TACHYCARDIA ABLATION N/A 12/28/2013  ? Procedure: SUPRAVENTRICULAR TACHYCARDIA ABLATION;  Surgeon: Evans Lance, MD;  Location: [facility] CATH LAB;  Service: Cardiovascular;  Laterality: N/A;  ? [place]  ? ?Patient Active Problem List  ? Diagnosis Date Noted  ? Phlebitis after infusion 08/31/2021  ? Adverse drug reaction 08/31/2021  ? Nausea 08/31/2021  ? Fibroids, submucosal 06/17/2021  ? Diabetes mellitus ([place]) 05/26/2021  ? Vitamin D deficiency 05/26/2021  ? Overweight (BMI 25.0-29.9) 05/26/2021  ? Shoulder pain, right 02/12/2021  ? Peri-menopause 01/20/2021  ? Menorrhagia with irregular cycle 01/20/2021  ? Pre-diabetes 11/19/2020  ? Absolute anemia 11/19/2020  ? Iron deficiency anemia due to chronic blood loss 10/10/2020  ? Abnormal CT scan, pelvis 04/01/2020  ? Symptomatic varicose  veins, bilateral 09/01/2018  ? Dyspepsia 01/18/2017  ? Back pain with sciatica 09/20/2014  ? Solitary lung nodule 09/05/2014  ? Hyperlipidemia associated with type 2 diabetes mellitus ([place]) 01/26/2014  ? Encounter for annual physical exam 04/18/2012  ? Allergic rhinitis 09/08/2010  ? OTH ABNORMAL BRAIN & CNS FUNCTION STUDY 02/16/2010  ? GOITER, UNSPECIFIED 02/10/2010  ? Class 1 obesity with serious comorbidity and body mass index (BMI) of 30.0 to 30.9 in adult 02/10/2010  ? Essential hypertension 05/02/2009  ? ? ?ONSET DATE: August 2022 ? ?REFERRING DIAG: Right adhesive capsulitis ? ?THERAPY DIAG:  ?Other symptoms and signs involving the musculoskeletal system ? ?Acute pain of right  shoulder ? ?Stiffness of right shoulder, not elsewhere classified ? ?SUBJECTIVE:  ? ?SUBJECTIVE STATEMENT: ?S: The injections did help me be able to sleep more comfortably. I can't sleep on my right side though.  ? ?Pt accompanied by: self ? ?PERTINENT HISTORY:  ?Patient was seen by Dr. Marlou Sa for right shoulder pain. MRI results show some restriction of the axillary recess consistent with adhesive capsulitis and mild tendinosis of the supraspinatus tendon with a tiny insertional interstitial tear as well as mild tendinosis of the infraspinatus tendon. She will follow-up in 3 weeks with Dr. Marlou Sa for clinical recheck with decision point at that time to be for or against manipulation under anesthesia.   ? ?PRECAUTIONS: None ? ?WEIGHT BEARING RESTRICTIONS No ? ?PAIN:  ?Are you having pain?  0/10 at rest. When using it to complete activities such as upper dressing it will be severe 9 or 10/10. ? ?FALLS: Has patient fallen in last 6 months? No ? ?LIVING ENVIRONMENT: ?Lives with: lives with their family and lives with their spouse ? ? 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  ? ?PATIENT GOALS Less pain and be able to use it. ? ?OBJECTIVE:  ? ?HAND DOMINANCE: Right ? ?ADLs: ?Overall ADLs: Difficulty using her RUE to complete any daily task. Reports no difficulty with work activities.  ? ? ?FUNCTIONAL OUTCOME MEASURES: ?FOTO: 56/100 ? ?UE ROM    ? ?Active ROM ?Seated- IR/er adducted Right ?10/13/2021  ?Shoulder flexion 107  ?Shoulder abduction 95  ?Shoulder internal rotation 90  ?Shoulder external rotation 52  ?(Blank rows = not tested) ? ? ?Passive ROM ?Supine- IR/er adducted Right ?10/13/2021  ?Shoulder flexion 120  ?Shoulder abduction 180  ?Shoulder internal rotation 90  ?Shoulder external rotation 35  ? ? ? ?UE MMT:    ? ?MMT - seated. IR/er adducted Right ?10/13/2021  ?Shoulder flexion 3-/5  ?Shoulder abduction 3-/5  ?Shoulder internal rotation 3/5  ?Shoulder  external rotation 3-/5  ?(Blank rows = not tested) ? ? ? ?COGNITION: ?Overall cognitive status: Within functional limits for tasks assessed ? ?OBSERVATIONS: Max fascial restrictions palpated in the right upper arm, upper trapezius, and scapularis region.  ? ? ?TODAY'S TREATMENT:  ?- AA/ROM shoulder all ranges supine 5-10X ? ? ?PATIENT EDUCATION: ?Education details: AA/ROM shoulder exercises, supine. Recommended use of heat for pain management along with pain medication. Try to move the RUE as much as possible. ?Person educated: Patient ?Education method: Explanation, Demonstration, Verbal cues, and Handouts ?Education comprehension: verbalized understanding and returned demonstration ? ? ?HOME EXERCISE PROGRAM: ?Eval: supine shoulder AA/ROM ? ?GOALS: ? ? ?SHORT TERM GOALS: Target date: 11/03/2021 ? ?Pt will be educated and independent with HEP in order to facilitate her progress in therapy and allow her to return to using her RUE as her dominant extremity.  ?  Baseline: ?Goal status: INITIAL ? ?2.  Patient will increase her RUE A/ROM to [facility] in order to return to completing mid to high level reaching tasks including less difficulty when attempting to fix her hair.  ?Baseline:  ?Goal status: INITIAL ? ?3.  Pt will increase her RUE strength to 4/5 in order to return to completing heavy household chores and manage moderate weight using both arms. ?Baseline:  ?Goal status: INITIAL ? ?4.  Pt will decrease her RUE fascial restrictions to min amount or less in order to increase the functional mobility needed to complete reaching tasks.  ?Baseline:  ?Goal status: INITIAL ? ?5.  Pt will report a decrease in pain level of approximately 5/10 or less when using her RUE for all daily tasks.  ?Baseline:  ?Goal status: INITIAL ? ? ? ? ?ASSESSMENT: ? ?CLINICAL IMPRESSION: ?Patient is a 51 y.o. female who was seen today for occupational therapy evaluation for right frozen shoulder causing increased pain, fascial restrictions, and  decreased ROM and strength resulting in severe difficulty completing needed ADL tasks.   ? ?PERFORMANCE DEFICITS in functional skills including ADLs, IADLs, ROM, strength, pain, fascial restrictions, and UE funct

## 2021-10-14 ENCOUNTER — Other Ambulatory Visit (HOSPITAL_COMMUNITY): Payer: Self-pay

## 2021-10-20 ENCOUNTER — Ambulatory Visit (HOSPITAL_COMMUNITY): Payer: No Typology Code available for payment source | Attending: Orthopedic Surgery

## 2021-10-20 ENCOUNTER — Encounter (HOSPITAL_COMMUNITY): Payer: Self-pay

## 2021-10-20 DIAGNOSIS — R29898 Other symptoms and signs involving the musculoskeletal system: Secondary | ICD-10-CM | POA: Insufficient documentation

## 2021-10-20 DIAGNOSIS — M25611 Stiffness of right shoulder, not elsewhere classified: Secondary | ICD-10-CM | POA: Diagnosis present

## 2021-10-20 DIAGNOSIS — M25511 Pain in right shoulder: Secondary | ICD-10-CM | POA: Insufficient documentation

## 2021-10-20 NOTE — Patient Instructions (Signed)
Complete the following exercises 2-3 times a day. ? ? ?Internal Rotation Across Back ? ?Grab the end of a towel with your affected side, palm facing backwards. Grab the towel with your unaffected side and pull your affected hand across your back until you feel a stretch in the front of your shoulder. If you feel pain, pull just to the pain, do not pull through the pain. Hold. Return your affected arm to your side. Try to keep your hand/arm close to your body during the entire movement.   ?  Hold for 10-20 seconds. Complete 2 times.    ? ? ? ? ? ? ?Wall Flexion ? ?Slide your arm up the wall or door frame until a stretch is felt in your shoulder . Hold for 10-20 seconds. Complete 2 times  ? ? ? ?Shoulder Abduction Stretch ? ?Stand side ways by a wall with affected up on wall. Gently step in toward wall to feel stretch. Hold for 10-20 seconds. Complete 2 times. ? ?  ?

## 2021-10-20 NOTE — Therapy (Addendum)
?OUTPATIENT OCCUPATIONAL THERAPY TREATMENT NOTE ? ? ?Patient Name: [name] ?MRN: [id number] ?DOB:November 27, 1970, 51 y.o., female ?Today's Date: 10/20/2021 ? ?PCP: Fayrene Helper, MD ?REFERRING PROVIDER: Meredith Pel, MD ? ?END OF SESSION:  ? OT End of Session - 10/20/21 1708   ? ? Visit Number 2   ? Number of Visits 6   ? Date for OT Re-Evaluation 11/03/21   ? Authorization Type [employer]   ? Authorization Time Period $40 copay. 25 visit limit OT. Authorization required after 12th visit. 0 visits used.   ? Authorization - Visit Number 2   ? Authorization - Number of Visits 25   ? Progress Note Due on Visit 12   ? OT Start Time 1308   ? OT Stop Time 1725   ? OT Time Calculation (min) 38 min   ? Activity Tolerance Patient tolerated treatment well   ? Behavior During Therapy [facility] for tasks assessed/performed   ? ?  ?  ? ?  ? ? ?Past Medical History:  ?Diagnosis Date  ? Anemia   ? Arthritis   ? Phreesia 03/29/2020  ? Back pain   ? DDD (degenerative disc disease), lumbar   ? Diabetes mellitus without complication ([place])   ? Phreesia 03/29/2020  ? Essential hypertension, benign   ? GERD (gastroesophageal reflux disease)   ? Hypertension   ? Phreesia 03/29/2020  ? Joint pain   ? Lactose intolerance   ? Lumbar herniated disc   ? Migraine headache 2007  ? Obesity   ? Since childhood   ? Osteoarthritis   ? Pneumonia 2007  ? PONV (postoperative nausea and vomiting)   ? Patient states she woke up during tubal ligation and has had PONV in the past.  ? Sacroiliac joint disease   ? Spinal disease   ? Supraventricular tachycardia ([place])   ? Status post RFA July 2015 - Dr. Lovena Le  ? Type 2 diabetes mellitus ([place])   ? Varicose vein of leg   ? Vitamin D deficiency   ? Weight loss   ? Hopitalized 4 years ago after loosing 200lb on a supervised program, malnurished with severe protein deficiency   ? ?Past Surgical History:  ?Procedure Laterality Date  ? ABLATION  12-28-2013  ? slow pathway modification of AVNRT by Dr  Lovena Le  ? COLONOSCOPY N/A 11/17/2019  ? Procedure: COLONOSCOPY;  Surgeon: Daneil Dolin, MD;  [facility];  Service: Endoscopy;  Laterality: N/A;  7:30  ? ELECTROPHYSIOLOGY STUDY N/A 12/28/2013  ? Procedure: ELECTROPHYSIOLOGY STUDY;  Surgeon: Evans Lance, MD;  Location: [facility] CATH LAB;  Service: Cardiovascular;  Laterality: N/A;  ? HYSTEROSCOPY WITH D & C N/A 08/19/2021  ? Procedure: DILATATION AND CURETTAGE /HYSTEROSCOPY;  Surgeon: Megan Salon, MD;  Location: [place];  Service: Gynecology;  Laterality: N/A;  ? SUPRAVENTRICULAR TACHYCARDIA ABLATION N/A 12/28/2013  ? Procedure: SUPRAVENTRICULAR TACHYCARDIA ABLATION;  Surgeon: Evans Lance, MD;  Location: [facility] CATH LAB;  Service: Cardiovascular;  Laterality: N/A;  ? [place]  ? ?Patient Active Problem List  ? Diagnosis Date Noted  ? Phlebitis after infusion 08/31/2021  ? Adverse drug reaction 08/31/2021  ? Nausea 08/31/2021  ? Fibroids, submucosal 06/17/2021  ? Diabetes mellitus ([place]) 05/26/2021  ? Vitamin D deficiency 05/26/2021  ? Overweight (BMI 25.0-29.9) 05/26/2021  ? Shoulder pain, right 02/12/2021  ? Peri-menopause 01/20/2021  ? Menorrhagia with irregular cycle 01/20/2021  ? Pre-diabetes 11/19/2020  ? Absolute anemia 11/19/2020  ?  Iron deficiency anemia due to chronic blood loss 10/10/2020  ? Abnormal CT scan, pelvis 04/01/2020  ? Symptomatic varicose veins, bilateral 09/01/2018  ? Dyspepsia 01/18/2017  ? Back pain with sciatica 09/20/2014  ? Solitary lung nodule 09/05/2014  ? Hyperlipidemia associated with type 2 diabetes mellitus ([place]) 01/26/2014  ? Encounter for annual physical exam 04/18/2012  ? Allergic rhinitis 09/08/2010  ? OTH ABNORMAL BRAIN & CNS FUNCTION STUDY 02/16/2010  ? GOITER, UNSPECIFIED 02/10/2010  ? Class 1 obesity with serious comorbidity and body mass index (BMI) of 30.0 to 30.9 in adult 02/10/2010  ? Essential hypertension 05/02/2009  ? ? ?ONSET DATE: August 2022 ? ?REFERRING DIAG: Right adhesive capsulitis ? ?THERAPY  DIAG:  ?Other symptoms and signs involving the musculoskeletal system ? ?Stiffness of right shoulder, not elsewhere classified ? ?Acute pain of right shoulder ? ? ?PERTINENT HISTORY: Patient was seen by Dr. Marlou Sa for right shoulder pain. MRI results show some restriction of the axillary recess consistent with adhesive capsulitis and mild tendinosis of the supraspinatus tendon with a tiny insertional interstitial tear as well as mild tendinosis of the infraspinatus tendon. She will follow-up in 3 weeks with Dr. Marlou Sa for clinical recheck with decision point at that time to be for or against manipulation under anesthesia.   ? ?PRECAUTIONS: None ? ?SUBJECTIVE: S: It's a little sore today. ? ?PAIN:  ?Are you having pain? Yes: NPRS scale: 4/10 ?Pain location: right lateral aspect of upper arm. ?Pain description: sore ?Aggravating factors: weather, maybe typing ?Relieving factors: pain medication, heating pad ? ? ? ? ?OBJECTIVE:  ?FUNCTIONAL OUTCOME MEASURES: ?FOTO: 56/100 ?  ?UE ROM    ?  ?Active ROM ?Seated- IR/er adducted Right ?10/13/2021  ?Shoulder flexion 107  ?Shoulder abduction 95  ?Shoulder internal rotation 90  ?Shoulder external rotation 52  ?(Blank rows = not tested) ?  ?  ?Passive ROM ?Supine- IR/er adducted Right ?10/13/2021  ?Shoulder flexion 120  ?Shoulder abduction 180  ?Shoulder internal rotation 90  ?Shoulder external rotation 35  ?  ?  ?  ?UE MMT:    ?  ?MMT - seated. IR/er adducted Right ?10/13/2021  ?Shoulder flexion 3-/5  ?Shoulder abduction 3-/5  ?Shoulder internal rotation 3/5  ?Shoulder external rotation 3-/5  ?(Blank rows = not tested) ? ?GOALS: ?  ?  ?SHORT TERM GOALS: Target date: 11/03/2021 ?  ?Pt will be educated and independent with HEP in order to facilitate her progress in therapy and allow her to return to using her RUE as her dominant extremity.  ?Baseline: ?Goal status: on-going ?  ?2.  Patient will increase her RUE A/ROM to [facility] in order to return to completing mid to high level reaching  tasks including less difficulty when attempting to fix her hair.  ?Baseline:  ?Goal status:on-going ?  ?3.  Pt will increase her RUE strength to 4/5 in order to return to completing heavy household chores and manage moderate weight using both arms. ?Baseline:  ?Goal status: on-going ?  ?4.  Pt will decrease her RUE fascial restrictions to min amount or less in order to increase the functional mobility needed to complete reaching tasks.  ?Baseline:  ?Goal status: on-going ?  ?5.  Pt will report a decrease in pain level of approximately 5/10 or less when using her RUE for all daily tasks.  ?Baseline:  ?Goal status: on-going ?  ?PATIENT EDUCATION: ?Education details:  ?Person educated:    ?IT sales professional:    ?Education comprehension:       ? ?  HOME EXERCISE PROGRAM: ?Eval: supine shoulder AA/ROM ? ?TODAY'S TREATMENT: ?10/20/21 ?- Manual therapy: completed prior to exercises. Myofascial release completed to right upper arm and upper trapezius.  ?- P/ROM: supine all shoulder ranges, 5X ?- AA/ROM: supine, shoulder, flexion (2" hold), horizontal abduction/adduction, 10X ?- AA/ROM: standing, shoulder, flexion, IR/er, abduction, 10X ?- Wall wash: used cabinet for height, 1' ?- PVC pipe slide, 10X ?- Pulleys: seated, 1' flexion, 1' abduction ?- UBE bike: 2' forward, 2' reverse, pace: 5.0-6.0 ? ?Clinical impression statement: A: Initiated myofascial with patient presented with minimal fascial restrictions located at medial deltoid. Focused on increasing ROM with use of AA/ROM supine and standing. UBE used at end of session for strength and endurance. VC for form and technique provided. ? ?PLAN: ?OT FREQUENCY: 2x/week ?  ?OT DURATION: 3 weeks ?  ?PLANNED INTERVENTIONS: self care/ADL training, therapeutic exercise, therapeutic activity, neuromuscular re-education, manual therapy, passive range of motion, electrical stimulation, ultrasound, moist heat, cryotherapy, patient/family education, and DME and/or AE instructions ?  ?  ?   ?CONSULTED AND AGREED WITH PLAN OF CARE: Patient ?  ?PLAN FOR NEXT SESSION: P:  Add shoulder stretches (printed from this visit but not reviewed yet). Continue to work on shoulder ROM. Complete A/ROM supin

## 2021-10-20 NOTE — Progress Notes (Signed)
? ? ? ?Chief Complaint:  ? ?OBESITY ?[name] is here to discuss her progress with her obesity treatment plan along with follow-up of her obesity related diagnoses. [name] is on the Category 3 Plan and states she is following her eating plan approximately 98% of the time. [name] states she is doing 0 minutes 0 times per week. ? ?Today's visit was #: 15 ?Starting weight: 205 lbs ?Starting date: 08/22/2020 ?Today's weight: 169 lbs ?Today's date: 10/07/2021 ?Total lbs lost to date: 85 ?Total lbs lost since last in-office visit: 3 ? ?Interim History:  ?We will check IC/RMR at her next office visit.  ?[name]'s current weight is 23.6, with a BMI of 23.6 ?Visceral adipose rating 6- at goal. ? ?Subjective:  ? ?1. Type 2 diabetes mellitus with other specified complication, without long-term current use of insulin ([place]) ?[name] is taking Ozempic 1 mg and she is tolerating it well.  ?She denies mass in neck, dysphagia, dyspepsia, persistent hoarseness, abd pain, or N/V/Constipation. ? ?2. Vitamin D deficiency ?[name] is on Ergocalciferol-she denies nausea, vomiting, or muscle weakness.  ? ?3. Hyperlipidemia associated with type 2 diabetes mellitus ([place]) ?[name] has been taking Crestor 10 mg twice weekly since 05/26/2021. ?She denies myalgias. ? ?Assessment/Plan:  ? ?1. Type 2 diabetes mellitus with other specified complication, without long-term current use of insulin ([place]) ?We will refill Ozempic 1 mg once week for 1 month. ? ?- Semaglutide, 1 MG/DOSE, 4 MG/3ML SOPN; Inject 1 mg into the skin once a week.  Dispense: 9 mL; Refill: 0 ? ?2. Vitamin D deficiency ?We will refill Ergo 50,000 IU weekly for 2 months. ? ?- Vitamin D, Ergocalciferol, (DRISDOL) 1.25 MG (50000 UNIT) CAPS capsule; Take 1 capsule by mouth every 7 days.  Dispense: 8 capsule; Refill: 0 ? ?3. Hyperlipidemia associated with type 2 diabetes mellitus ([place]) ?We will refill Crestor 10 mg on Wednesday and Sunday. ? ?- rosuvastatin (CRESTOR) 10 MG tablet; Take 1 tablet  by mouth 2 times weekly (Wed/Sun)  Dispense: 8 tablet; Refill: 0 ? ?4. Obesity, current BMI 23.6 ?[name] is currently in the action stage of change. As such, her goal is to continue with weight loss efforts. She has agreed to the Category 3 Plan.  ? ?Recheck IC at her next office visit. ? ?Exercise goals: All adults should avoid inactivity. Some physical activity is better than none, and adults who participate in any amount of physical activity gain some health benefits. ? ?Behavioral modification strategies: increasing lean protein intake, decreasing simple carbohydrates, meal planning and cooking strategies, keeping healthy foods in the home, and planning for success. ? ?[name] has agreed to follow-up with our clinic in 6 weeks. She was informed of the importance of frequent follow-up visits to maximize her success with intensive lifestyle modifications for her multiple health conditions.  ? ?Objective:  ? ?Blood pressure 102/62, pulse 67, temperature 98.1 ?F (36.7 ?C), height '5\' 11"'$  (1.803 m), weight 169 lb (76.7 kg), SpO2 97 %. ?Body mass index is 23.57 kg/m?. ? ?General: Cooperative, alert, well developed, in no acute distress. ?HEENT: Conjunctivae and lids unremarkable. ?Cardiovascular: Regular rhythm.  ?Lungs: Normal work of breathing. ?Neurologic: No focal deficits.  ? ?Lab Results  ?Component Value Date  ? CREATININE 0.88 08/13/2021  ? BUN 13 08/13/2021  ? NA 137 08/13/2021  ? K 3.8 08/13/2021  ? CL 102 08/13/2021  ? CO2 26 08/13/2021  ? ?Lab Results  ?Component Value Date  ? ALT 10 07/09/2021  ? AST 14 07/09/2021  ?  ALKPHOS 47 07/09/2021  ? BILITOT <0.2 07/09/2021  ? ?Lab Results  ?Component Value Date  ? HGBA1C 5.2 07/09/2021  ? HGBA1C 5.0 04/15/2021  ? HGBA1C 5.8 (H) 08/22/2020  ? HGBA1C 5.4 03/15/2019  ? HGBA1C 5.4 08/23/2017  ? ?Lab Results  ?Component Value Date  ? INSULIN 8.6 07/09/2021  ? INSULIN 19.2 04/15/2021  ? INSULIN 7.8 08/22/2020  ? ?Lab Results  ?Component Value Date  ? TSH 1.250 08/22/2020   ? ?Lab Results  ?Component Value Date  ? CHOL 163 07/09/2021  ? HDL 64 07/09/2021  ? [place] 80 07/09/2021  ? TRIG 104 07/09/2021  ? CHOLHDL 2.5 07/09/2021  ? ?Lab Results  ?Component Value Date  ? VD25OH 67.6 08/18/2021  ? VD25OH 29.2 (L) 04/15/2021  ? VD25OH 38.5 08/22/2020  ? ?Lab Results  ?Component Value Date  ? WBC 6.9 10/02/2021  ? HGB 12.9 10/02/2021  ? HCT 39.0 10/02/2021  ? MCV 90.7 10/02/2021  ? PLT 265 10/02/2021  ? ?Lab Results  ?Component Value Date  ? IRON 42 10/02/2021  ? TIBC 317 10/02/2021  ? FERRITIN 193 10/02/2021  ? ?Attestation Statements:  ? ?Reviewed by clinician on day of visit: allergies, medications, problem list, medical history, surgical history, family history, social history, and previous encounter notes. ? ? ?I, Trixie Dredge, am acting as transcriptionist for Mina Marble, NP. ? ?I have reviewed the above documentation for accuracy and completeness, and I agree with the above. -  Naryah Clenney d. Mickie Badders, NP-C ? ?

## 2021-10-21 ENCOUNTER — Ambulatory Visit (HOSPITAL_COMMUNITY): Payer: No Typology Code available for payment source

## 2021-10-21 ENCOUNTER — Encounter (HOSPITAL_COMMUNITY): Payer: No Typology Code available for payment source | Admitting: Occupational Therapy

## 2021-10-21 ENCOUNTER — Encounter (HOSPITAL_COMMUNITY): Payer: Self-pay

## 2021-10-21 ENCOUNTER — Ambulatory Visit: Payer: No Typology Code available for payment source | Admitting: Family Medicine

## 2021-10-21 DIAGNOSIS — M25511 Pain in right shoulder: Secondary | ICD-10-CM

## 2021-10-21 DIAGNOSIS — M25611 Stiffness of right shoulder, not elsewhere classified: Secondary | ICD-10-CM

## 2021-10-21 DIAGNOSIS — R29898 Other symptoms and signs involving the musculoskeletal system: Secondary | ICD-10-CM

## 2021-10-21 NOTE — Therapy (Signed)
?OUTPATIENT OCCUPATIONAL THERAPY TREATMENT NOTE ? ? ?Patient Name: [name] ?MRN: [id number] ?DOB:April 07, 1971, 51 y.o., female ?Today's Date: 10/21/2021 ? ?PCP: Fayrene Helper, MD ?REFERRING PROVIDER: Meredith Pel, MD ? ?END OF SESSION:  ? OT End of Session - 10/21/21 1259   ? ? Visit Number 3   ? Number of Visits 6   ? Date for OT Re-Evaluation 11/03/21   ? Authorization Type [employer]   ? Authorization Time Period $40 copay. 25 visit limit OT. Authorization required after 12th visit. 0 visits used.   ? Authorization - Visit Number 3   ? Authorization - Number of Visits 25   ? Progress Note Due on Visit 12   ? OT Start Time 1300   ? OT Stop Time 1338   ? OT Time Calculation (min) 38 min   ? Activity Tolerance Patient tolerated treatment well   ? Behavior During Therapy [facility] for tasks assessed/performed   ? ?  ?  ? ?  ? ? ? ?Past Medical History:  ?Diagnosis Date  ? Anemia   ? Arthritis   ? Phreesia 03/29/2020  ? Back pain   ? DDD (degenerative disc disease), lumbar   ? Diabetes mellitus without complication ([place])   ? Phreesia 03/29/2020  ? Essential hypertension, benign   ? GERD (gastroesophageal reflux disease)   ? Hypertension   ? Phreesia 03/29/2020  ? Joint pain   ? Lactose intolerance   ? Lumbar herniated disc   ? Migraine headache 2007  ? Obesity   ? Since childhood   ? Osteoarthritis   ? Pneumonia 2007  ? PONV (postoperative nausea and vomiting)   ? Patient states she woke up during tubal ligation and has had PONV in the past.  ? Sacroiliac joint disease   ? Spinal disease   ? Supraventricular tachycardia ([place])   ? Status post RFA July 2015 - Dr. Lovena Le  ? Type 2 diabetes mellitus ([place])   ? Varicose vein of leg   ? Vitamin D deficiency   ? Weight loss   ? Hopitalized 4 years ago after loosing 200lb on a supervised program, malnurished with severe protein deficiency   ? ?Past Surgical History:  ?Procedure Laterality Date  ? ABLATION  12-28-2013  ? slow pathway modification of AVNRT by Dr  Lovena Le  ? COLONOSCOPY N/A 11/17/2019  ? Procedure: COLONOSCOPY;  Surgeon: Daneil Dolin, MD;  [facility];  Service: Endoscopy;  Laterality: N/A;  7:30  ? ELECTROPHYSIOLOGY STUDY N/A 12/28/2013  ? Procedure: ELECTROPHYSIOLOGY STUDY;  Surgeon: Evans Lance, MD;  Location: [facility] CATH LAB;  Service: Cardiovascular;  Laterality: N/A;  ? HYSTEROSCOPY WITH D & C N/A 08/19/2021  ? Procedure: DILATATION AND CURETTAGE /HYSTEROSCOPY;  Surgeon: Megan Salon, MD;  Location: [place];  Service: Gynecology;  Laterality: N/A;  ? SUPRAVENTRICULAR TACHYCARDIA ABLATION N/A 12/28/2013  ? Procedure: SUPRAVENTRICULAR TACHYCARDIA ABLATION;  Surgeon: Evans Lance, MD;  Location: [facility] CATH LAB;  Service: Cardiovascular;  Laterality: N/A;  ? [place]  ? ?Patient Active Problem List  ? Diagnosis Date Noted  ? Phlebitis after infusion 08/31/2021  ? Adverse drug reaction 08/31/2021  ? Nausea 08/31/2021  ? Fibroids, submucosal 06/17/2021  ? Diabetes mellitus ([place]) 05/26/2021  ? Vitamin D deficiency 05/26/2021  ? Overweight (BMI 25.0-29.9) 05/26/2021  ? Shoulder pain, right 02/12/2021  ? Peri-menopause 01/20/2021  ? Menorrhagia with irregular cycle 01/20/2021  ? Pre-diabetes 11/19/2020  ? Absolute anemia  11/19/2020  ? Iron deficiency anemia due to chronic blood loss 10/10/2020  ? Abnormal CT scan, pelvis 04/01/2020  ? Symptomatic varicose veins, bilateral 09/01/2018  ? Dyspepsia 01/18/2017  ? Back pain with sciatica 09/20/2014  ? Solitary lung nodule 09/05/2014  ? Hyperlipidemia associated with type 2 diabetes mellitus ([place]) 01/26/2014  ? Encounter for annual physical exam 04/18/2012  ? Allergic rhinitis 09/08/2010  ? OTH ABNORMAL BRAIN & CNS FUNCTION STUDY 02/16/2010  ? GOITER, UNSPECIFIED 02/10/2010  ? Class 1 obesity with serious comorbidity and body mass index (BMI) of 30.0 to 30.9 in adult 02/10/2010  ? Essential hypertension 05/02/2009  ? ? ?ONSET DATE: August 2022 ? ?REFERRING DIAG: Right adhesive capsulitis ? ?THERAPY  DIAG:  ?Other symptoms and signs involving the musculoskeletal system ? ?Stiffness of right shoulder, not elsewhere classified ? ?Acute pain of right shoulder ? ? ?PERTINENT HISTORY: Patient was seen by Dr. Marlou Sa for right shoulder pain. MRI results show some restriction of the axillary recess consistent with adhesive capsulitis and mild tendinosis of the supraspinatus tendon with a tiny insertional interstitial tear as well as mild tendinosis of the infraspinatus tendon. She will follow-up in 3 weeks with Dr. Marlou Sa for clinical recheck with decision point at that time to be for or against manipulation under anesthesia.   ? ?PRECAUTIONS: None ? ?SUBJECTIVE: S: It's sore today. I've been using the heating pad.  ?PAIN:  ?Are you having pain? Yes: NPRS scale: 4/10 ?Pain location: right lateral aspect of upper arm. ?Pain description: sore ?Aggravating factors: weather, maybe typing ?Relieving factors: pain medication, heating pad ? ? ? ? ?OBJECTIVE:  ?FUNCTIONAL OUTCOME MEASURES: ?FOTO: 56/100 ?  ?UE ROM    ?  ?Active ROM ?Seated- IR/er adducted Right ?10/13/2021  ?Shoulder flexion 107  ?Shoulder abduction 95  ?Shoulder internal rotation 90  ?Shoulder external rotation 52  ?(Blank rows = not tested) ?  ?  ?Passive ROM ?Supine- IR/er adducted Right ?10/13/2021  ?Shoulder flexion 120  ?Shoulder abduction 180  ?Shoulder internal rotation 90  ?Shoulder external rotation 35  ?  ?  ?  ?UE MMT:    ?  ?MMT - seated. IR/er adducted Right ?10/13/2021  ?Shoulder flexion 3-/5  ?Shoulder abduction 3-/5  ?Shoulder internal rotation 3/5  ?Shoulder external rotation 3-/5  ?(Blank rows = not tested) ? ?GOALS: ?  ?  ?SHORT TERM GOALS: Target date: 11/03/2021 ?  ?Pt will be educated and independent with HEP in order to facilitate her progress in therapy and allow her to return to using her RUE as her dominant extremity.  ?Baseline: ?Goal status: on-going ?  ?2.  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  ?Baseline:  ?Goal status: on-going ?  ?3.  Pt will increase her RUE strength to 4/5 in order to return to completing heavy household chores and manage moderate weight using both arms. ?Baseline:  ?Goal status: on-going ?  ?4.  Pt will decrease her RUE fascial restrictions to min amount or less in order to increase the functional mobility needed to complete reaching tasks.  ?Baseline:  ?Goal status: on-going ?  ?5.  Pt will report a decrease in pain level of approximately 5/10 or less when using her RUE for all daily tasks.  ?Baseline:  ?Goal status: on-going ?  ?PATIENT EDUCATION: ?Education details: shoulder stretches, A/ROM shoulder ?Person educated:  patient ?Education method:  verbal, demonstration,  handout ?Education comprehension:  verbalized and demonstrated understanding    ? ?HOME EXERCISE PROGRAM: ?Eval: supine shoulder AA/ROM 5/2: shoulder stretches and A/ROM ? ?TODAY'S TREATMENT: ?10/21/21 ?-  Manual therapy: completed prior to exercises. Myofascial release completed to right upper arm and upper trapezius.  ?- P/ROM: supine all shoulder ranges, 5X ?- AA/ROM: supine, shoulder flexion (2" hold), 10X ?- AA/ROM: standing, shoulder, flexion, IR/er, abduction, horizontal abduction/adduction, 10X ?- Stretches: shoulder, internal rotation (towel horizontal), flexion, abduction, 2x20" ?- A/ROM: shoulder standing, flexion, abduction 5X each ? ? ?10/20/21 ?- Manual therapy: completed prior to exercises. Myofascial release completed to right upper arm and upper trapezius.  ?- P/ROM: supine all shoulder ranges, 5X ?- AA/ROM: supine, shoulder, flexion (2" hold), horizontal abduction/adduction, 10X ?- AA/ROM: standing, shoulder, flexion, IR/er, abduction, 10X ?- Wall wash: used cabinet for height, 1' ?- PVC pipe slide, 10X ?- Pulleys: seated, 1' flexion, 1' abduction ?- UBE bike: 2' forward, 2' reverse, pace: 5.0-6.0 ? ? ? ? ?Clinical  impression statement: A: Continued  with myofascial release to right upper arm and upper trapezius although min fascial restrictions noted.  VC for form and technique provided. Added shoulder stretches

## 2021-10-21 NOTE — Patient Instructions (Signed)
Repeat all exercises 10-15 times, 1-2 times per day. ? ? ?2) Shoulder Flexion ? ? Standing:  ?      ? ?Begin with arms at your side with thumbs pointed up, slowly raise both arms up and forward towards overhead.  ? ? ? ? ? ?3) Horizontal abduction/adduction ? ? ? ?Standing: ? ?       ? ? ?Begin with arms straight out in front of you, bring out to the side in at "T" shape. Keep arms straight entire time.  ? ? ? ? ? ?4) Internal & External Rotation ? ?   Standing: ?   ? ?Stand with elbows at the side and elbows bent 90 degrees. Move your forearms away from your body, then bring back inward toward the body.   ? ? ?5) Shoulder Abduction ? ?Standing:  ? ? ?  ? ?Lying on your back begin with your arms flat on the table next to your side. Slowly move your arms out to the side so that they go overhead, in a jumping jack or snow angel movement.  ? ? ? ?

## 2021-10-27 ENCOUNTER — Encounter (HOSPITAL_COMMUNITY): Payer: Self-pay | Admitting: Occupational Therapy

## 2021-10-27 ENCOUNTER — Ambulatory Visit (HOSPITAL_COMMUNITY): Payer: No Typology Code available for payment source | Admitting: Occupational Therapy

## 2021-10-27 DIAGNOSIS — M25511 Pain in right shoulder: Secondary | ICD-10-CM

## 2021-10-27 DIAGNOSIS — R29898 Other symptoms and signs involving the musculoskeletal system: Secondary | ICD-10-CM

## 2021-10-27 DIAGNOSIS — M25611 Stiffness of right shoulder, not elsewhere classified: Secondary | ICD-10-CM

## 2021-10-27 NOTE — Therapy (Signed)
?OUTPATIENT OCCUPATIONAL THERAPY TREATMENT NOTE ? ? ?Patient Name: [name] ?MRN: [id number] ?DOB:Dec 19, 1970, 51 y.o., female ?Today's Date: 10/27/2021 ? ?PCP: Fayrene Helper, MD ?REFERRING PROVIDER: Meredith Pel, MD ? ?END OF SESSION:  ? OT End of Session - 10/27/21 1422   ? ? Visit Number 4   ? Number of Visits 6   ? Date for OT Re-Evaluation 11/03/21   ? Authorization Type [employer]   ? Authorization Time Period $40 copay. 25 visit limit OT. Authorization required after 12th visit. 0 visits used.   ? Authorization - Visit Number 4   ? Authorization - Number of Visits 25   ? Progress Note Due on Visit 12   ? OT Start Time 1345   ? OT Stop Time 1423   ? OT Time Calculation (min) 38 min   ? Activity Tolerance Patient tolerated treatment well   ? Behavior During Therapy [facility] for tasks assessed/performed   ? ?  ?  ? ?  ? ? ? ? ?Past Medical History:  ?Diagnosis Date  ? Anemia   ? Arthritis   ? Phreesia 03/29/2020  ? Back pain   ? DDD (degenerative disc disease), lumbar   ? Diabetes mellitus without complication ([place])   ? Phreesia 03/29/2020  ? Essential hypertension, benign   ? GERD (gastroesophageal reflux disease)   ? Hypertension   ? Phreesia 03/29/2020  ? Joint pain   ? Lactose intolerance   ? Lumbar herniated disc   ? Migraine headache 2007  ? Obesity   ? Since childhood   ? Osteoarthritis   ? Pneumonia 2007  ? PONV (postoperative nausea and vomiting)   ? Patient states she woke up during tubal ligation and has had PONV in the past.  ? Sacroiliac joint disease   ? Spinal disease   ? Supraventricular tachycardia ([place])   ? Status post RFA July 2015 - Dr. Lovena Le  ? Type 2 diabetes mellitus ([place])   ? Varicose vein of leg   ? Vitamin D deficiency   ? Weight loss   ? Hopitalized 4 years ago after loosing 200lb on a supervised program, malnurished with severe protein deficiency   ? ?Past Surgical History:  ?Procedure Laterality Date  ? ABLATION  12-28-2013  ? slow pathway modification of AVNRT by  Dr Lovena Le  ? COLONOSCOPY N/A 11/17/2019  ? Procedure: COLONOSCOPY;  Surgeon: Daneil Dolin, MD;  [facility];  Service: Endoscopy;  Laterality: N/A;  7:30  ? ELECTROPHYSIOLOGY STUDY N/A 12/28/2013  ? Procedure: ELECTROPHYSIOLOGY STUDY;  Surgeon: Evans Lance, MD;  Location: [facility] CATH LAB;  Service: Cardiovascular;  Laterality: N/A;  ? HYSTEROSCOPY WITH D & C N/A 08/19/2021  ? Procedure: DILATATION AND CURETTAGE /HYSTEROSCOPY;  Surgeon: Megan Salon, MD;  Location: [place];  Service: Gynecology;  Laterality: N/A;  ? SUPRAVENTRICULAR TACHYCARDIA ABLATION N/A 12/28/2013  ? Procedure: SUPRAVENTRICULAR TACHYCARDIA ABLATION;  Surgeon: Evans Lance, MD;  Location: [facility] CATH LAB;  Service: Cardiovascular;  Laterality: N/A;  ? [place]  ? ?Patient Active Problem List  ? Diagnosis Date Noted  ? Phlebitis after infusion 08/31/2021  ? Adverse drug reaction 08/31/2021  ? Nausea 08/31/2021  ? Fibroids, submucosal 06/17/2021  ? Diabetes mellitus ([place]) 05/26/2021  ? Vitamin D deficiency 05/26/2021  ? Overweight (BMI 25.0-29.9) 05/26/2021  ? Shoulder pain, right 02/12/2021  ? Peri-menopause 01/20/2021  ? Menorrhagia with irregular cycle 01/20/2021  ? Pre-diabetes 11/19/2020  ? Absolute  anemia 11/19/2020  ? Iron deficiency anemia due to chronic blood loss 10/10/2020  ? Abnormal CT scan, pelvis 04/01/2020  ? Symptomatic varicose veins, bilateral 09/01/2018  ? Dyspepsia 01/18/2017  ? Back pain with sciatica 09/20/2014  ? Solitary lung nodule 09/05/2014  ? Hyperlipidemia associated with type 2 diabetes mellitus ([place]) 01/26/2014  ? Encounter for annual physical exam 04/18/2012  ? Allergic rhinitis 09/08/2010  ? OTH ABNORMAL BRAIN & CNS FUNCTION STUDY 02/16/2010  ? GOITER, UNSPECIFIED 02/10/2010  ? Class 1 obesity with serious comorbidity and body mass index (BMI) of 30.0 to 30.9 in adult 02/10/2010  ? Essential hypertension 05/02/2009  ? ? ?ONSET DATE: August 2022 ? ?REFERRING DIAG: Right adhesive  capsulitis ? ?THERAPY DIAG:  ?Other symptoms and signs involving the musculoskeletal system ? ?Stiffness of right shoulder, not elsewhere classified ? ?Acute pain of right shoulder ? ? ?PERTINENT HISTORY: Patient was seen by Dr. Marlou Sa for right shoulder pain. MRI results show some restriction of the axillary recess consistent with adhesive capsulitis and mild tendinosis of the supraspinatus tendon with a tiny insertional interstitial tear as well as mild tendinosis of the infraspinatus tendon. She will follow-up in 3 weeks with Dr. Marlou Sa for clinical recheck with decision point at that time to be for or against manipulation under anesthesia.   ? ?PRECAUTIONS: None ? ?SUBJECTIVE: S: It's feeling ok, if I'm not using it a lot it doesn't bother me too bad.  ? ?PAIN:  ?Are you having pain? Yes: NPRS scale: 4/10 ?Pain location: right lateral aspect of upper arm. ?Pain description: aching ?Aggravating factors: weather, maybe typing ?Relieving factors: pain medication, heating pad ? ? ? ? ?OBJECTIVE:  ?FUNCTIONAL OUTCOME MEASURES: ?FOTO: 56/100 ?  ?UE ROM    ?  ?Active ROM ?Seated- IR/er adducted Right ?10/13/2021  ?Shoulder flexion 107  ?Shoulder abduction 95  ?Shoulder internal rotation 90  ?Shoulder external rotation 52  ?(Blank rows = not tested) ?  ?  ?Passive ROM ?Supine- IR/er adducted Right ?10/13/2021  ?Shoulder flexion 120  ?Shoulder abduction 180  ?Shoulder internal rotation 90  ?Shoulder external rotation 35  ?  ?  ?  ?UE MMT:    ?  ?MMT - seated. IR/er adducted Right ?10/13/2021  ?Shoulder flexion 3-/5  ?Shoulder abduction 3-/5  ?Shoulder internal rotation 3/5  ?Shoulder external rotation 3-/5  ?(Blank rows = not tested) ? ?GOALS: ?  ?  ?SHORT TERM GOALS: Target date: 11/03/2021 ?  ?Pt will be educated and independent with HEP in order to facilitate her progress in therapy and allow her to return to using her RUE as her dominant extremity.  ?Baseline: ?Goal status: on-going ?  ?2.  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  ?Baseline:  ?Goal status: on-going ?  ?3.  Pt will increase her RUE strength to 4/5 in order to return to completing heavy household chores and manage moderate weight using both arms. ?Baseline:  ?Goal status: on-going ?  ?4.  Pt will decrease her RUE fascial restrictions to min amount or less in order to increase the functional mobility needed to complete reaching tasks.  ?Baseline:  ?Goal status: on-going ?  ?5.  Pt will report a decrease in pain level of approximately 5/10 or less when using her RUE for all daily tasks.  ?Baseline:  ?Goal status: on-going ?  ?PATIENT EDUCATION: ?Education details: shoulder stretches,  A/ROM shoulder ?Person educated:  patient ?Education method:  verbal, demonstration,  handout ?Education comprehension:  verbalized and demonstrated understanding    ? ?HOME EXERCISE PROGRAM: ?Eval: supine shoulder AA/ROM 5/2: shoulder stretches and A/ROM ? ?TODAY'S TREATMENT: ?10/27/2021 ?- A/ROM: supine-protraction, flexion, er/IR, horizontal abduction, abduction, 10X each ?- Stretches: shoulder- internal rotation (towel horizontal), flexion, 2x20" ?- A/ROM: standing-protraction, flexion, horizontal abduction, er/IR, abduction 10X each ?-Red scapular theraband: row, extension, retraction, 10X each ?-Overhead lacing: pt began lacing top down then removed ?-UBE: level 1, 3' forward 3' reverse, pace: 3.5 ? ? ? ?10/21/21 ?-  Manual therapy: completed prior to exercises. Myofascial release completed to right upper arm and upper trapezius.  ?- P/ROM: supine all shoulder ranges, 5X ?- AA/ROM: supine, shoulder flexion (2" hold), 10X ?- AA/ROM: standing, shoulder, flexion, IR/er, abduction, horizontal abduction/adduction, 10X ?- Stretches: shoulder, internal rotation (towel horizontal), flexion, abduction, 2x20" ?- A/ROM: shoulder standing, flexion, abduction 5X  each ? ? ?10/20/21 ?- Manual therapy: completed prior to exercises. Myofascial release completed to right upper arm and upper trapezius.  ?- P/ROM: supine all shoulder ranges, 5X ?- AA/ROM: supine, shoulder, flexion (2" hold), horizontal

## 2021-10-28 ENCOUNTER — Encounter (HOSPITAL_COMMUNITY): Payer: Self-pay | Admitting: Occupational Therapy

## 2021-10-28 ENCOUNTER — Ambulatory Visit (HOSPITAL_COMMUNITY): Payer: No Typology Code available for payment source | Admitting: Occupational Therapy

## 2021-10-28 DIAGNOSIS — R29898 Other symptoms and signs involving the musculoskeletal system: Secondary | ICD-10-CM | POA: Diagnosis not present

## 2021-10-28 DIAGNOSIS — M25611 Stiffness of right shoulder, not elsewhere classified: Secondary | ICD-10-CM

## 2021-10-28 DIAGNOSIS — M25511 Pain in right shoulder: Secondary | ICD-10-CM

## 2021-10-28 NOTE — Therapy (Signed)
?OUTPATIENT OCCUPATIONAL THERAPY TREATMENT NOTE ? ? ?Patient Name: [name] ?MRN: [id number] ?DOB:01-15-1971, 51 y.o., female ?Today's Date: 10/28/2021 ? ?PCP: Fayrene Helper, MD ?REFERRING PROVIDER: Meredith Pel, MD ? ?END OF SESSION:  ? OT End of Session - 10/28/21 1206   ? ? Visit Number 5   ? Number of Visits 6   ? Date for OT Re-Evaluation 11/03/21   ? Authorization Type [employer]   ? Authorization Time Period $40 copay. 25 visit limit OT. Authorization required after 12th visit. 0 visits used.   ? Authorization - Visit Number 5   ? Authorization - Number of Visits 25   ? Progress Note Due on Visit 12   ? OT Start Time 1118   ? OT Stop Time 1158   ? OT Time Calculation (min) 40 min   ? Activity Tolerance Patient tolerated treatment well   ? Behavior During Therapy [facility] for tasks assessed/performed   ? ?  ?  ? ?  ? ? ? ? ? ?Past Medical History:  ?Diagnosis Date  ? Anemia   ? Arthritis   ? Phreesia 03/29/2020  ? Back pain   ? DDD (degenerative disc disease), lumbar   ? Diabetes mellitus without complication ([place])   ? Phreesia 03/29/2020  ? Essential hypertension, benign   ? GERD (gastroesophageal reflux disease)   ? Hypertension   ? Phreesia 03/29/2020  ? Joint pain   ? Lactose intolerance   ? Lumbar herniated disc   ? Migraine headache 2007  ? Obesity   ? Since childhood   ? Osteoarthritis   ? Pneumonia 2007  ? PONV (postoperative nausea and vomiting)   ? Patient states she woke up during tubal ligation and has had PONV in the past.  ? Sacroiliac joint disease   ? Spinal disease   ? Supraventricular tachycardia ([place])   ? Status post RFA July 2015 - Dr. Lovena Le  ? Type 2 diabetes mellitus ([place])   ? Varicose vein of leg   ? Vitamin D deficiency   ? Weight loss   ? Hopitalized 4 years ago after loosing 200lb on a supervised program, malnurished with severe protein deficiency   ? ?Past Surgical History:  ?Procedure Laterality Date  ? ABLATION  12-28-2013  ? slow pathway modification of AVNRT  by Dr Lovena Le  ? COLONOSCOPY N/A 11/17/2019  ? Procedure: COLONOSCOPY;  Surgeon: Daneil Dolin, MD;  [facility];  Service: Endoscopy;  Laterality: N/A;  7:30  ? ELECTROPHYSIOLOGY STUDY N/A 12/28/2013  ? Procedure: ELECTROPHYSIOLOGY STUDY;  Surgeon: Evans Lance, MD;  Location[facility] CATH LAB;  Service: Cardiovascular;  Laterality: N/A;  ? HYSTEROSCOPY WITH D & C N/A 08/19/2021  ? Procedure: DILATATION AND CURETTAGE /HYSTEROSCOPY;  Surgeon: Megan Salon, MD;  Location: [place];  Service: Gynecology;  Laterality: N/A;  ? SUPRAVENTRICULAR TACHYCARDIA ABLATION N/A 12/28/2013  ? Procedure: SUPRAVENTRICULAR TACHYCARDIA ABLATION;  Surgeon: Evans Lance, MD;  Location: [facility] CATH LAB;  Service: Cardiovascular;  Laterality: N/A;  ? [place]  ? ?Patient Active Problem List  ? Diagnosis Date Noted  ? Phlebitis after infusion 08/31/2021  ? Adverse drug reaction 08/31/2021  ? Nausea 08/31/2021  ? Fibroids, submucosal 06/17/2021  ? Diabetes mellitus ([place]) 05/26/2021  ? Vitamin D deficiency 05/26/2021  ? Overweight (BMI 25.0-29.9) 05/26/2021  ? Shoulder pain, right 02/12/2021  ? Peri-menopause 01/20/2021  ? Menorrhagia with irregular cycle 01/20/2021  ? Pre-diabetes 11/19/2020  ?  Absolute anemia 11/19/2020  ? Iron deficiency anemia due to chronic blood loss 10/10/2020  ? Abnormal CT scan, pelvis 04/01/2020  ? Symptomatic varicose veins, bilateral 09/01/2018  ? Dyspepsia 01/18/2017  ? Back pain with sciatica 09/20/2014  ? Solitary lung nodule 09/05/2014  ? Hyperlipidemia associated with type 2 diabetes mellitus ([place]) 01/26/2014  ? Encounter for annual physical exam 04/18/2012  ? Allergic rhinitis 09/08/2010  ? OTH ABNORMAL BRAIN & CNS FUNCTION STUDY 02/16/2010  ? GOITER, UNSPECIFIED 02/10/2010  ? Class 1 obesity with serious comorbidity and body mass index (BMI) of 30.0 to 30.9 in adult 02/10/2010  ? Essential hypertension 05/02/2009  ? ? ?ONSET DATE: August 2022 ? ?REFERRING DIAG: Right adhesive  capsulitis ? ?THERAPY DIAG:  ?Other symptoms and signs involving the musculoskeletal system ? ?Stiffness of right shoulder, not elsewhere classified ? ?Acute pain of right shoulder ? ? ?PERTINENT HISTORY: Patient was seen by Dr. Marlou Sa for right shoulder pain. MRI results show some restriction of the axillary recess consistent with adhesive capsulitis and mild tendinosis of the supraspinatus tendon with a tiny insertional interstitial tear as well as mild tendinosis of the infraspinatus tendon. She will follow-up in 3 weeks with Dr. Marlou Sa for clinical recheck with decision point at that time to be for or against manipulation under anesthesia.   ? ?PRECAUTIONS: None ? ?SUBJECTIVE: S: It is feeling achy today.  ? ?PAIN:  ?Are you having pain? Yes: NPRS scale: 4/10 ?Pain location: right lateral aspect of upper arm. ?Pain description: aching ?Aggravating factors: working ?Relieving factors: pain medication, heating pad ? ? ? ? ?OBJECTIVE:  ?FUNCTIONAL OUTCOME MEASURES: ?FOTO: 56/100 ?  ?UE ROM    ?  ?Active ROM ?Seated- IR/er adducted Right ?10/13/2021  ?Shoulder flexion 107  ?Shoulder abduction 95  ?Shoulder internal rotation 90  ?Shoulder external rotation 52  ?(Blank rows = not tested) ?  ?  ?Passive ROM ?Supine- IR/er adducted Right ?10/13/2021  ?Shoulder flexion 120  ?Shoulder abduction 180  ?Shoulder internal rotation 90  ?Shoulder external rotation 35  ?  ?  ?  ?UE MMT:    ?  ?MMT - seated. IR/er adducted Right ?10/13/2021  ?Shoulder flexion 3-/5  ?Shoulder abduction 3-/5  ?Shoulder internal rotation 3/5  ?Shoulder external rotation 3-/5  ?(Blank rows = not tested) ? ?GOALS: ?  ?  ?SHORT TERM GOALS: Target date: 11/03/2021 ?  ?Pt will be educated and independent with HEP in order to facilitate her progress in therapy and allow her to return to using her RUE as her dominant extremity.  ?Baseline: ?Goal status: on-going ?  ?2.  Patient will increase her RUE A/ROM to [facility] in order to return to completing mid to high level  reaching tasks including less difficulty when attempting to fix her hair.  ?Baseline:  ?Goal status: on-going ?  ?3.  Pt will increase her RUE strength to 4/5 in order to return to completing heavy household chores and manage moderate weight using both arms. ?Baseline:  ?Goal status: on-going ?  ?4.  Pt will decrease her RUE fascial restrictions to min amount or less in order to increase the functional mobility needed to complete reaching tasks.  ?Baseline:  ?Goal status: on-going ?  ?5.  Pt will report a decrease in pain level of approximately 5/10 or less when using her RUE for all daily tasks.  ?Baseline:  ?Goal status: on-going ?  ?PATIENT EDUCATION: ?Education details: behind head/back passing ?Person educated:  patient ?Education method:  verbal, demonstration  ?Education  comprehension:  verbalized and demonstrated understanding    ? ?HOME EXERCISE PROGRAM: ?Eval: supine shoulder AA/ROM 5/2: shoulder stretches and A/ROM  ? ?TODAY'S TREATMENT: ?10/28/21 ?- A/ROM: supine-protraction, flexion, er/IR, horizontal abduction, abduction, 12X each ?- A/ROM: standing-protraction, flexion, horizontal abduction, er/IR, abduction 12X each ?- Overhead reaching to place pegs on peg board overhead while seated using R UE.  ?-Overhead reaching standing to place cones in cabinet overhead. 1# wrist weight used on R UE.  ?-Red scapular theraband: row, extension, retraction, 12X each ?-Overhead lacing: pt began lacing top down then removed donning 1lb wrist weight. Weight removed last half of unlacing due to pt fatigue and frequent rest breaks.  ?-UBE: level 2, 3' forward 3' reverse, pace: 4.0 to 6.0 ?-x10 reps of over the shoulder and behind back transfer of cone working to simulate dressing upper body.  ? ?10/27/2021 ?- A/ROM: supine-protraction, flexion, er/IR, horizontal abduction, abduction, 10X each ?- Stretches: shoulder- internal rotation (towel horizontal), flexion, 2x20" ?- A/ROM: standing-protraction, flexion, horizontal  abduction, er/IR, abduction 10X each ?-Red scapular theraband: row, extension, retraction, 10X each ?-Overhead lacing: pt began lacing top down then removed ?-UBE: level 1, 3' forward 3' reverse, pace: 3.5 ? ? ? ?10/21/21 ?

## 2021-10-31 ENCOUNTER — Encounter (HOSPITAL_COMMUNITY): Payer: No Typology Code available for payment source

## 2021-10-31 LAB — CMP14+EGFR
ALT: 13 IU/L (ref 0–32)
AST: 16 IU/L (ref 0–40)
Albumin/Globulin Ratio: 1.5 (ref 1.2–2.2)
Albumin: 5 g/dL — ABNORMAL HIGH (ref 3.8–4.9)
Alkaline Phosphatase: 65 IU/L (ref 44–121)
BUN/Creatinine Ratio: 20 (ref 9–23)
BUN: 16 mg/dL (ref 6–24)
Bilirubin Total: 0.2 mg/dL (ref 0.0–1.2)
CO2: 24 mmol/L (ref 20–29)
Calcium: 9.8 mg/dL (ref 8.7–10.2)
Chloride: 101 mmol/L (ref 96–106)
Creatinine, Ser: 0.82 mg/dL (ref 0.57–1.00)
Globulin, Total: 3.4 g/dL (ref 1.5–4.5)
Glucose: 64 mg/dL — ABNORMAL LOW (ref 70–99)
Potassium: 4.2 mmol/L (ref 3.5–5.2)
Sodium: 140 mmol/L (ref 134–144)
Total Protein: 8.4 g/dL (ref 6.0–8.5)
eGFR: 87 mL/min/{1.73_m2} (ref >59)

## 2021-10-31 LAB — VITAMIN D 25 HYDROXY (VIT D DEFICIENCY, FRACTURES): Vit D, 25-Hydroxy: 58.1 ng/mL (ref 30.0–100.0)

## 2021-10-31 LAB — LIPID PANEL
Chol/HDL Ratio: 2.2 ratio (ref 0.0–4.4)
Cholesterol, Total: 141 mg/dL (ref 100–199)
HDL: 65 mg/dL (ref >39)
LDL Chol Calc (NIH): 62 mg/dL (ref 0–99)
Triglycerides: 72 mg/dL (ref 0–149)
VLDL Cholesterol Cal: 14 mg/dL (ref 5–40)

## 2021-11-03 ENCOUNTER — Ambulatory Visit (HOSPITAL_COMMUNITY): Payer: No Typology Code available for payment source | Admitting: Occupational Therapy

## 2021-11-03 ENCOUNTER — Encounter (HOSPITAL_COMMUNITY): Payer: Self-pay | Admitting: Occupational Therapy

## 2021-11-03 DIAGNOSIS — M25511 Pain in right shoulder: Secondary | ICD-10-CM

## 2021-11-03 DIAGNOSIS — R29898 Other symptoms and signs involving the musculoskeletal system: Secondary | ICD-10-CM | POA: Diagnosis not present

## 2021-11-03 DIAGNOSIS — M25611 Stiffness of right shoulder, not elsewhere classified: Secondary | ICD-10-CM

## 2021-11-03 NOTE — Patient Instructions (Signed)
1) (Home) Extension: Isometric / Bilateral Arm Retraction - Sitting ? ? ?Facing anchor, hold hands and elbow at shoulder height, with elbow bent.  Pull arms back to squeeze shoulder blades together. Repeat 10-15 times. 1-3 times/day.  ? ?2) (Clinic) Extension / Flexion (Assist) ? ? ?Face anchor, pull arms back, keeping elbow straight, and squeze shoulder blades together. ?Repeat 10-15 times. 1-3 times/day.  ? ?Copyright ? [employer]. All rights reserved.  ? ?3) (Home) Retraction: Row - Bilateral (Anchor) ? ? ?Facing anchor, arms reaching forward, pull hands toward stomach, keeping elbows bent and at your sides and pinching shoulder blades together. ?Repeat 10-15 times. 1-3 times/day.  ? ?Copyright ? [employer]. All rights reserved.  ? ? ? ? ?Theraband strengthening: Complete 10-15X, 1-2X/day ? ?1) Shoulder protraction ? ?Anchor band in doorway, stand with back to door. Push your hand forward as much as you can to bringing your shoulder blades forward on your rib cage. ? ? ? ? ? ?2) Shoulder horizontal abduction ? ?Standing with a theraband anchored at chest height, begin with arm straight and some tension in the band. Move your arm out to your side (keeping straight the whole time). Bring the affected arm back to midline. ? ? ? ? ?3) Shoulder Internal Rotation ? ?While holding an elastic band at your side with your elbow bent, start with your hand away from your stomach, then pull the band towards your stomach. Keep your elbow near your side the entire time. ? ? ? ? ?4) Shoulder External Rotation ? ?While holding an elastic band at your side with your elbow bent, start with your hand near your stomach and then pull the band away. Keep your elbow at your side the entire time. ? ? ? ? ?5) Shoulder flexion ? ?While standing with back to the door, holding Theraband at hand level, raise arm in front of you.  Keep elbow straight through entire movement.  ? ? ? ? ?6) Shoulder abduction ? ?While holding an elastic band at your side, draw  up your arm to the side keeping your elbow straight. ?  ? ?

## 2021-11-03 NOTE — Therapy (Signed)
?OUTPATIENT OCCUPATIONAL THERAPY REASSESSMENT, TREATMENT NOTE ?DISCHARGE SUMMARY ? ? ?Patient Name: [name] ?MRN: [id number] ?DOB:1970/11/09, 51 y.o., female ?Today's Date: 11/03/2021 ? ?PCP: Fayrene Helper, MD ?REFERRING PROVIDER: Meredith Pel, MD ? ?END OF SESSION:  ? OT End of Session - 11/03/21 1418   ? ? Visit Number 6   ? Number of Visits 6   ? Date for OT Re-Evaluation 11/03/21   ? Authorization Type [employer]   ? Authorization Time Period $40 copay. 25 visit limit OT. Authorization required after 12th visit. 0 visits used.   ? Authorization - Visit Number 6   ? Authorization - Number of Visits 25   ? Progress Note Due on Visit 12   ? OT Start Time 1347   ? OT Stop Time 1416   ? OT Time Calculation (min) 29 min   ? Activity Tolerance Patient tolerated treatment well   ? Behavior During Therapy [facility] for tasks assessed/performed   ? ?  ?  ? ?  ? ? ? ? ? ?Past Medical History:  ?Diagnosis Date  ? Anemia   ? Arthritis   ? Phreesia 03/29/2020  ? Back pain   ? DDD (degenerative disc disease), lumbar   ? Diabetes mellitus without complication ([place])   ? Phreesia 03/29/2020  ? Essential hypertension, benign   ? GERD (gastroesophageal reflux disease)   ? Hypertension   ? Phreesia 03/29/2020  ? Joint pain   ? Lactose intolerance   ? Lumbar herniated disc   ? Migraine headache 2007  ? Obesity   ? Since childhood   ? Osteoarthritis   ? Pneumonia 2007  ? PONV (postoperative nausea and vomiting)   ? Patient states she woke up during tubal ligation and has had PONV in the past.  ? Sacroiliac joint disease   ? Spinal disease   ? Supraventricular tachycardia ([place])   ? Status post RFA July 2015 - Dr. Lovena Le  ? Type 2 diabetes mellitus ([place])   ? Varicose vein of leg   ? Vitamin D deficiency   ? Weight loss   ? Hopitalized 4 years ago after loosing 200lb on a supervised program, malnurished with severe protein deficiency   ? ?Past Surgical History:  ?Procedure Laterality Date  ? ABLATION  12-28-2013  ?  slow pathway modification of AVNRT by Dr Lovena Le  ? COLONOSCOPY N/A 11/17/2019  ? Procedure: COLONOSCOPY;  Surgeon: Daneil Dolin, MD;  [facility];  Service: Endoscopy;  Laterality: N/A;  7:30  ? ELECTROPHYSIOLOGY STUDY N/A 12/28/2013  ? Procedure: ELECTROPHYSIOLOGY STUDY;  Surgeon: Evans Lance, MD;  Location: [facility] CATH LAB;  Service: Cardiovascular;  Laterality: N/A;  ? HYSTEROSCOPY WITH D & C N/A 08/19/2021  ? Procedure: DILATATION AND CURETTAGE /HYSTEROSCOPY;  Surgeon: Megan Salon, MD;  Location: [place];  Service: Gynecology;  Laterality: N/A;  ? SUPRAVENTRICULAR TACHYCARDIA ABLATION N/A 12/28/2013  ? Procedure: SUPRAVENTRICULAR TACHYCARDIA ABLATION;  Surgeon: Evans Lance, MD;  Location: [facility] CATH LAB;  Service: Cardiovascular;  Laterality: N/A;  ? [place]  ? ?Patient Active Problem List  ? Diagnosis Date Noted  ? Phlebitis after infusion 08/31/2021  ? Adverse drug reaction 08/31/2021  ? Nausea 08/31/2021  ? Fibroids, submucosal 06/17/2021  ? Diabetes mellitus ([place]) 05/26/2021  ? Vitamin D deficiency 05/26/2021  ? Overweight (BMI 25.0-29.9) 05/26/2021  ? Shoulder pain, right 02/12/2021  ? Peri-menopause 01/20/2021  ? Menorrhagia with irregular cycle 01/20/2021  ? Pre-diabetes  11/19/2020  ? Absolute anemia 11/19/2020  ? Iron deficiency anemia due to chronic blood loss 10/10/2020  ? Abnormal CT scan, pelvis 04/01/2020  ? Symptomatic varicose veins, bilateral 09/01/2018  ? Dyspepsia 01/18/2017  ? Back pain with sciatica 09/20/2014  ? Solitary lung nodule 09/05/2014  ? Hyperlipidemia associated with type 2 diabetes mellitus ([place]) 01/26/2014  ? Encounter for annual physical exam 04/18/2012  ? Allergic rhinitis 09/08/2010  ? OTH ABNORMAL BRAIN & CNS FUNCTION STUDY 02/16/2010  ? GOITER, UNSPECIFIED 02/10/2010  ? Class 1 obesity with serious comorbidity and body mass index (BMI) of 30.0 to 30.9 in adult 02/10/2010  ? Essential hypertension 05/02/2009  ? ? ?ONSET DATE: August 2022 ? ?REFERRING  DIAG: Right adhesive capsulitis ? ?THERAPY DIAG:  ?Other symptoms and signs involving the musculoskeletal system ? ?Stiffness of right shoulder, not elsewhere classified ? ?Acute pain of right shoulder ? ? ?PERTINENT HISTORY: Patient was seen by Dr. Marlou Sa for right shoulder pain. MRI results show some restriction of the axillary recess consistent with adhesive capsulitis and mild tendinosis of the supraspinatus tendon with a tiny insertional interstitial tear as well as mild tendinosis of the infraspinatus tendon. She will follow-up in 3 weeks with Dr. Marlou Sa for clinical recheck with decision point at that time to be for or against manipulation under anesthesia.   ? ?PRECAUTIONS: None ? ?SUBJECTIVE: S: It's off and on with the pain.  ? ?PAIN:  ?Are you having pain? Yes: NPRS scale: 4/10 ?Pain location: right lateral aspect of upper arm. ?Pain description: aching ?Aggravating factors: weather, maybe typing ?Relieving factors: pain medication, heating pad ? ? ? ? ?OBJECTIVE:  ?FUNCTIONAL OUTCOME MEASURES: ?FOTO: 67/100 (56/100 previous) ?  ?UE ROM    ?  ?Active ROM ?Seated- IR/er adducted Right ?10/13/2021 Right ?11/03/2021  ?Shoulder flexion 107 140  ?Shoulder abduction 95 151  ?Shoulder internal rotation 90 90  ?Shoulder external rotation 52 40  ?(Blank rows = not tested) ?  ?  ?Passive ROM ?Supine- IR/er adducted Right ?10/13/2021 Right ?11/03/2021  ?Shoulder flexion 120 145  ?Shoulder abduction 180 180  ?Shoulder internal rotation 90 90  ?Shoulder external rotation 35 43  ?  ?  ?  ?UE MMT:    ?  ?MMT - seated. IR/er adducted Right ?10/13/2021 Right  ?11/03/2021  ?Shoulder flexion 3-/5 4+/5  ?Shoulder abduction 3-/5 4+/5  ?Shoulder internal rotation 3/5 4+/5  ?Shoulder external rotation 3-/5 4+/5  ?(Blank rows = not tested) ? ?GOALS: ?  ?  ?SHORT TERM GOALS: Target date: 11/03/2021 ?  ?Pt will be educated and independent with HEP in order to facilitate her progress in therapy and allow her to return to using her RUE as her  dominant extremity.  ?Baseline: ?Goal status: Achieved ?  ?2.  Patient will increase her RUE A/ROM to [facility] in order to return to completing mid to high level reaching tasks including less difficulty when attempting to fix her hair.  ?Baseline:  ?Goal status: Partially Met ?  ?3.  Pt will increase her RUE strength to 4/5 in order to return to completing heavy household chores and manage moderate weight using both arms. ?Baseline:  ?Goal status: Achieved ?  ?4.  Pt will decrease her RUE fascial restrictions to min amount or less in order to increase the functional mobility needed to complete reaching tasks.  ?Baseline:  ?Goal status: Achieved  ?  ?5.  Pt will report a decrease in pain level of approximately 5/10 or less when using her RUE  for all daily tasks.  ?Baseline:  ?Goal status: Achieved ?  ?PATIENT EDUCATION: ?Education details: green theraband shoulder and scapular strengthening ?Person educated:  patient ?Education method:  verbal, demonstration,  handout ?Education comprehension:  verbalized and demonstrated understanding    ? ?HOME EXERCISE PROGRAM: ?Eval: supine shoulder AA/ROM 5/2: shoulder stretches and A/ROM; 5/15: green theraband shoulder and scapular strengthening ? ?TODAY'S TREATMENT: ?11/03/2021 ?-P/ROM: supine, flexion, abduction, IR/er, horizontal abduction, 5X each ?-Green scapular theraband: row, extension, retraction, 10X each ?-Green theraband strengthening: protraction, horizontal abduction, er/IR, abduction, flexion, 10X  ? ? ?10/27/2021 ?- A/ROM: supine-protraction, flexion, er/IR, horizontal abduction, abduction, 10X each ?- Stretches: shoulder- internal rotation (towel horizontal), flexion, 2x20" ?- A/ROM: standing-protraction, flexion, horizontal abduction, er/IR, abduction 10X each ?-Red scapular theraband: row, extension, retraction, 10X each ?-Overhead lacing: pt began lacing top down then removed ?-UBE: level 1, 3' forward 3' reverse, pace: 3.5 ? ? ? ?10/21/21 ?-  Manual therapy:  completed prior to exercises. Myofascial release completed to right upper arm and upper trapezius.  ?- P/ROM: supine all shoulder ranges, 5X ?- AA/ROM: supine, shoulder flexion (2" hold), 10X ?- AA/ROM: standing,

## 2021-11-04 ENCOUNTER — Encounter: Payer: Self-pay | Admitting: Family Medicine

## 2021-11-04 ENCOUNTER — Other Ambulatory Visit (HOSPITAL_COMMUNITY): Payer: Self-pay

## 2021-11-04 ENCOUNTER — Ambulatory Visit (INDEPENDENT_AMBULATORY_CARE_PROVIDER_SITE_OTHER): Payer: No Typology Code available for payment source | Admitting: Family Medicine

## 2021-11-04 DIAGNOSIS — R110 Nausea: Secondary | ICD-10-CM | POA: Diagnosis not present

## 2021-11-04 DIAGNOSIS — M549 Dorsalgia, unspecified: Secondary | ICD-10-CM

## 2021-11-04 DIAGNOSIS — E1169 Type 2 diabetes mellitus with other specified complication: Secondary | ICD-10-CM

## 2021-11-04 DIAGNOSIS — E663 Overweight: Secondary | ICD-10-CM | POA: Diagnosis not present

## 2021-11-04 DIAGNOSIS — G8929 Other chronic pain: Secondary | ICD-10-CM

## 2021-11-04 DIAGNOSIS — M25511 Pain in right shoulder: Secondary | ICD-10-CM

## 2021-11-04 DIAGNOSIS — M5430 Sciatica, unspecified side: Secondary | ICD-10-CM

## 2021-11-04 DIAGNOSIS — E785 Hyperlipidemia, unspecified: Secondary | ICD-10-CM

## 2021-11-04 DIAGNOSIS — I10 Essential (primary) hypertension: Secondary | ICD-10-CM

## 2021-11-04 MED ORDER — ROSUVASTATIN CALCIUM 10 MG PO TABS
ORAL_TABLET | ORAL | 5 refills | Status: DC
  Filled 2021-11-04: qty 8, 28d supply, fill #0
  Filled 2021-12-01: qty 8, 28d supply, fill #1
  Filled 2021-12-26: qty 8, 28d supply, fill #2
  Filled 2022-01-28: qty 8, 28d supply, fill #3
  Filled 2022-02-20: qty 8, 28d supply, fill #4
  Filled 2022-04-01: qty 8, 28d supply, fill #5

## 2021-11-04 NOTE — Assessment & Plan Note (Signed)
Controlled, no change in medication ?DASH diet and commitment to daily physical activity for a minimum of 30 minutes discussed and encouraged, as a part of hypertension management. ?The importance of attaining a healthy weight is also discussed. ? ? ?  11/04/2021  ?  4:14 PM 10/07/2021  ?  8:00 AM 09/01/2021  ?  2:43 PM 08/23/2021  ?  2:21 AM 08/22/2021  ?  9:23 PM 08/19/2021  ?  6:55 PM 08/19/2021  ?  6:40 PM  ?BP/Weight  ?Systolic BP 922 300 94 979 122 118 116  ?Diastolic BP 70 62 58 81 62 74 91  ?Wt. (Lbs) 172 169 172      ?BMI 23.99 kg/m2 23.57 kg/m2 23.99 kg/m2      ? ? ? ? ?

## 2021-11-04 NOTE — Assessment & Plan Note (Signed)
Controlled, no change in medication  

## 2021-11-04 NOTE — Assessment & Plan Note (Signed)
?  Patient re-educated about  the importance of commitment to a  minimum of 150 minutes of exercise per week as able. ? ?The importance of healthy food choices with portion control discussed, as well as eating regularly and within a 12 hour window most days. ?The need to choose "clean , green" food 50 to 75% of the time is discussed, as well as to make water the primary drink and set a goal of 64 ounces water daily. ? ?  ? ?  11/04/2021  ?  4:14 PM 10/07/2021  ?  8:00 AM 09/01/2021  ?  2:43 PM  ?Weight /BMI  ?Weight 172 lb 169 lb 172 lb  ?Height '5\' 11"'$  (1.803 m) '5\' 11"'$  (1.803 m) '5\' 11"'$  (1.803 m)  ?BMI 23.99 kg/m2 23.57 kg/m2 23.99 kg/m2  ? ? ?Marked improvement now in normal range ?

## 2021-11-04 NOTE — Assessment & Plan Note (Signed)
Improved with therapy, supervised by Ortho ?

## 2021-11-04 NOTE — Patient Instructions (Addendum)
Annual exam , no pap ,in 6 months, call if you need me sooner ? ?No med changes ? ?CONGRATS on weight loss ? ? ?It is important that you exercise regularly at least 30 minutes 5 times a week. If you develop chest pain, have severe difficulty breathing, or feel very tired, stop exercising immediately and seek medical attention  ? ?Fasting lipid, cmp and eGFr and tSH 5 days before November appt ? ?Thanks for choosing [facility], we consider it a privelige to serve you. ? ? ?

## 2021-11-04 NOTE — Progress Notes (Signed)
? ?  [name]     MRN: [id number]      DOB: 1971/05/01 ? ? ?HPI ?[name] is here for follow up and re-evaluation of chronic medical conditions, medication management and review of any available recent lab and radiology data.  ?Preventive health is updated, specifically  Cancer screening and Immunization.   ?Questions or concerns regarding consultations or procedures which the PT has had in the interim are  addressed. ?The PT denies any adverse reactions to current medications since the last visit.  ?There are no new concerns.  ?There are no specific complaints  ? ?ROS ?Denies recent fever or chills. ?Denies sinus pressure, nasal congestion, ear pain or sore throat. ?Denies chest congestion, productive cough or wheezing. ?Denies chest pains, palpitations and leg swelling ?Denies abdominal pain, nausea, vomiting,diarrhea or constipation.   ?Denies dysuria, frequency, hesitancy or incontinence. ?Improved pain and mobility of right shoulder folowing injection and PT. ?Denies headaches, seizures, numbness, or tingling. ?Denies depression, anxiety or insomnia. ?Denies skin break down or rash. ? ? ?PE ? ?BP 109/70   Pulse 85   Resp 16   Ht '5\' 11"'$  (1.803 m)   Wt 172 lb (78 kg)   SpO2 100%   BMI 23.99 kg/m?  ? ?Patient alert and oriented and in no cardiopulmonary distress. ? ?HEENT: No facial asymmetry, EOMI,     Neck supple . ? ?Chest: Clear to auscultation bilaterally. ? ?CVS: S1, S2 no murmurs, no S3.Regular rate. ? ?ABD: Soft non tender.  ? ?Ext: No edema ? ?MS: Adequate ROM spine, shoulders, hips and knees. ? ?Skin: Intact, no ulcerations or rash noted. ? ?Psych: Good eye contact, normal affect. Memory intact not anxious or depressed appearing. ? ?CNS: CN 2-12 intact, power,  normal throughout.no focal deficits noted. ? ? ?Assessment & Plan ? ?Hyperlipidemia with target low density lipoprotein (LDL) cholesterol less than 100 mg/dL ?Hyperlipidemia:Low fat diet discussed and encouraged. ?Controlled, no  change in medication ?Updated lab needed at/ before next visit. ? ? ? ?Lipid Panel  ?Lab Results  ?Component Value Date  ? CHOL 141 10/30/2021  ? HDL 65 10/30/2021  ? [place] 62 10/30/2021  ? TRIG 72 10/30/2021  ? CHOLHDL 2.2 10/30/2021  ? ? ? ? ? ?Nausea ?Controlled, no change in medication ? ? ?Overweight (BMI 25.0-29.9) ? ?Patient re-educated about  the importance of commitment to a  minimum of 150 minutes of exercise per week as able. ? ?The importance of healthy food choices with portion control discussed, as well as eating regularly and within a 12 hour window most days. ?The need to choose "clean , green" food 50 to 75% of the time is discussed, as well as to make water the primary drink and set a goal of 64 ounces water daily. ? ?  ? ?  11/04/2021  ?  4:14 PM 10/07/2021  ?  8:00 AM 09/01/2021  ?  2:43 PM  ?Weight /BMI  ?Weight 172 lb 169 lb 172 lb  ?Height '5\' 11"'$  (1.803 m) '5\' 11"'$  (1.803 m) '5\' 11"'$  (1.803 m)  ?BMI 23.99 kg/m2 23.57 kg/m2 23.99 kg/m2  ? ? ?Marked improvement now in normal range ? ?Shoulder pain, right ?Improved with therapy, supervised by Ortho ? ?Back pain with sciatica ?Controlled, no change in medication ? ? ?

## 2021-11-04 NOTE — Assessment & Plan Note (Addendum)
Hyperlipidemia:Low fat diet discussed and encouraged. ?Controlled, no change in medication ?Updated lab needed at/ before next visit. ? ? ? ?Lipid Panel  ?Lab Results  ?Component Value Date  ? CHOL 141 10/30/2021  ? HDL 65 10/30/2021  ? [place] 62 10/30/2021  ? TRIG 72 10/30/2021  ? CHOLHDL 2.2 10/30/2021  ? ? ? ? ?

## 2021-11-05 ENCOUNTER — Encounter: Payer: Self-pay | Admitting: Orthopedic Surgery

## 2021-11-05 ENCOUNTER — Ambulatory Visit (HOSPITAL_COMMUNITY): Payer: No Typology Code available for payment source | Admitting: Occupational Therapy

## 2021-11-05 ENCOUNTER — Ambulatory Visit: Payer: No Typology Code available for payment source | Admitting: Orthopedic Surgery

## 2021-11-05 DIAGNOSIS — M7501 Adhesive capsulitis of right shoulder: Secondary | ICD-10-CM | POA: Diagnosis not present

## 2021-11-05 NOTE — Progress Notes (Signed)
? ?Office Visit Note ?  ?Patient: [name]           ?Date of Birth: 10-10-1970           ?MRN: [id number] ?Visit Date: 11/05/2021 ?Requested by: Fayrene Helper, MD ?[address] ?[place],  [place] [address] ?PCP: Fayrene Helper, MD ? ?Subjective: ?Chief Complaint  ?Patient presents with  ? Right Shoulder - Follow-up  ? ? ?HPI: [name] is a 51 year old patient with right shoulder injection 09/18/2018.  Finished therapy last week.  Did get good relief from intra-articular injection.  Range of motion is improving.  Still has issues reaching behind her back.  Taking Naprosyn.  Using Flexeril and Tylenol to sleep. ?             ?ROS: All systems reviewed are negative as they relate to the chief complaint within the history of present illness.  Patient denies  fevers or chills. ? ? ?Assessment & Plan: ?Visit Diagnoses:  ?1. Adhesive capsulitis of right shoulder   ? ? ?Plan: Impression is right shoulder adhesive capsulitis which has improved with therapy and injection.  Continue daily stretching exercises and follow-up as needed.  Decision point today was for or against manipulation of her range of motion is improving and clinically she is improving.  Follow-up with [place] as needed.  Could consider repeat injection and resumption of therapy if her shoulder become stiffer ? ?Follow-Up Instructions: Return if symptoms worsen or fail to improve.  ? ?Orders:  ?No orders of the defined types were placed in this encounter. ? ?No orders of the defined types were placed in this encounter. ? ? ? ? Procedures: ?No procedures performed ? ? ?Clinical Data: ?No additional findings. ? ?Objective: ?Vital Signs: There were no vitals taken for this visit. ? ?Physical Exam:  ? ?Constitutional: Patient appears well-developed ?HEENT:  ?Head: Normocephalic ?Eyes:EOM are normal ?Neck: Normal range of motion ?Cardiovascular: Normal rate ?Pulmonary/chest: Effort normal ?Neurologic: Patient is alert ?Skin: Skin is  warm ?Psychiatric: Patient has normal mood and affect ? ? ?Ortho Exam: Ortho exam demonstrates full active and passive range of motion of the cervical spine.  Range of motion of the right shoulder passively is 70/110/170.  She has about 180 of forward flexion on the left.  Rotator cuff strength is intact infraspinatus supraspinatus subscap muscle testing.  No masses lymphadenopathy or skin changes noted in the shoulder region. ? ?Specialty Comments:  ?No specialty comments available. ? ?Imaging: ?No results found. ? ? ?PMFS History: ?Patient Active Problem List  ? Diagnosis Date Noted  ? Phlebitis after infusion 08/31/2021  ? Adverse drug reaction 08/31/2021  ? Nausea 08/31/2021  ? Fibroids, submucosal 06/17/2021  ? Vitamin D deficiency 05/26/2021  ? Overweight (BMI 25.0-29.9) 05/26/2021  ? Shoulder pain, right 02/12/2021  ? Peri-menopause 01/20/2021  ? Menorrhagia with irregular cycle 01/20/2021  ? Pre-diabetes 11/19/2020  ? Absolute anemia 11/19/2020  ? Iron deficiency anemia due to chronic blood loss 10/10/2020  ? Abnormal CT scan, pelvis 04/01/2020  ? Symptomatic varicose veins, bilateral 09/01/2018  ? Dyspepsia 01/18/2017  ? Back pain with sciatica 09/20/2014  ? Solitary lung nodule 09/05/2014  ? Hyperlipidemia with target low density lipoprotein (LDL) cholesterol less than 100 mg/dL 01/26/2014  ? Encounter for annual physical exam 04/18/2012  ? Allergic rhinitis 09/08/2010  ? OTH ABNORMAL BRAIN & CNS FUNCTION STUDY 02/16/2010  ? GOITER, UNSPECIFIED 02/10/2010  ? Class 1 obesity with serious comorbidity and body mass index (BMI)  of 30.0 to 30.9 in adult 02/10/2010  ? Essential hypertension 05/02/2009  ? ?Past Medical History:  ?Diagnosis Date  ? Anemia   ? Arthritis   ? Phreesia 03/29/2020  ? Back pain   ? DDD (degenerative disc disease), lumbar   ? Diabetes mellitus without complication ([place])   ? Phreesia 03/29/2020  ? Essential hypertension, benign   ? GERD (gastroesophageal reflux disease)   ? Hypertension    ? Phreesia 03/29/2020  ? Joint pain   ? Lactose intolerance   ? Lumbar herniated disc   ? Migraine headache 2007  ? Obesity   ? Since childhood   ? Osteoarthritis   ? Pneumonia 2007  ? PONV (postoperative nausea and vomiting)   ? Patient states she woke up during tubal ligation and has had PONV in the past.  ? Sacroiliac joint disease   ? Spinal disease   ? Supraventricular tachycardia ([place])   ? Status post RFA July 2015 - Dr. Lovena Le  ? Type 2 diabetes mellitus ([place])   ? Varicose vein of leg   ? Vitamin D deficiency   ? Weight loss   ? Hopitalized 4 years ago after loosing 200lb on a supervised program, malnurished with severe protein deficiency   ?  ?Family History  ?Problem Relation Age of Onset  ? Hypertension Mother   ? Diabetes Mother   ? Cancer Mother   ?     Uterine   ? Heart failure Mother   ?     CABG  ? Hyperlipidemia Mother   ? Heart disease Mother   ? Thyroid disease Mother   ? Cancer Father 84  ?     Prostate   ? Hypertension Father   ? Bradycardia Father   ?     Irregular heart beat   ? Hyperlipidemia Father   ? Kidney disease Father   ? Diabetes Brother   ? Diabetes Brother   ? Hypertension Brother   ? Hypertension Brother   ?  ?Past Surgical History:  ?Procedure Laterality Date  ? ABLATION  12-28-2013  ? slow pathway modification of AVNRT by Dr Lovena Le  ? COLONOSCOPY N/A 11/17/2019  ? Procedure: COLONOSCOPY;  Surgeon: Daneil Dolin, MD;  [facility]: [facility];  Service: Endoscopy;  Laterality: N/A;  7:30  ? ELECTROPHYSIOLOGY STUDY N/A 12/28/2013  ? Procedure: ELECTROPHYSIOLOGY STUDY;  Surgeon: Evans Lance, MD;  Location: [facility] CATH LAB;  Service: Cardiovascular;  Laterality: N/A;  ? HYSTEROSCOPY WITH D & C N/A 08/19/2021  ? Procedure: DILATATION AND CURETTAGE /HYSTEROSCOPY;  Surgeon: Megan Salon, MD;  Location: [place];  Service: Gynecology;  Laterality: N/A;  ? SUPRAVENTRICULAR TACHYCARDIA ABLATION N/A 12/28/2013  ? Procedure: SUPRAVENTRICULAR TACHYCARDIA ABLATION;  Surgeon: Evans Lance, MD;   Location: [facility] CATH LAB;  Service: Cardiovascular;  Laterality: N/A;  ? [place]  ? ?Social History  ? ?Occupational History  ? Occupation: English as a second language teacher at [employer]  ?Tobacco Use  ? Smoking status: Former  ?  Packs/day: 0.50  ?  Years: 7.00  ?  Pack years: 3.50  ?  Types: Cigarettes  ?  Quit date: 08/05/2012  ?  Years since quitting: 9.2  ? Smokeless tobacco: Never  ? Tobacco comments:  ?  quit in 2014  ?Vaping Use  ? Vaping Use: Never used  ?Substance and Sexual Activity  ? Alcohol use: No  ?  Alcohol/week: 0.0 standard drinks  ? Drug use: No  ? Sexual activity: Yes  ?  Birth control/protection: Surgical  ?  Comment: tubal ligation  ? ? ? ? ? ?

## 2021-11-18 ENCOUNTER — Other Ambulatory Visit (HOSPITAL_COMMUNITY): Payer: Self-pay

## 2021-11-18 ENCOUNTER — Ambulatory Visit (INDEPENDENT_AMBULATORY_CARE_PROVIDER_SITE_OTHER): Payer: No Typology Code available for payment source | Admitting: Adult Health

## 2021-11-18 ENCOUNTER — Encounter (INDEPENDENT_AMBULATORY_CARE_PROVIDER_SITE_OTHER): Payer: Self-pay | Admitting: Adult Health

## 2021-11-18 VITALS — BP 100/65 | HR 71 | Temp 98.1°F | Ht 71.0 in | Wt 168.0 lb

## 2021-11-18 DIAGNOSIS — E1169 Type 2 diabetes mellitus with other specified complication: Secondary | ICD-10-CM | POA: Diagnosis not present

## 2021-11-18 DIAGNOSIS — I152 Hypertension secondary to endocrine disorders: Secondary | ICD-10-CM | POA: Diagnosis not present

## 2021-11-18 DIAGNOSIS — R0602 Shortness of breath: Secondary | ICD-10-CM | POA: Diagnosis not present

## 2021-11-18 DIAGNOSIS — E559 Vitamin D deficiency, unspecified: Secondary | ICD-10-CM | POA: Diagnosis not present

## 2021-11-18 DIAGNOSIS — E1159 Type 2 diabetes mellitus with other circulatory complications: Secondary | ICD-10-CM

## 2021-11-18 DIAGNOSIS — Z6823 Body mass index (BMI) 23.0-23.9, adult: Secondary | ICD-10-CM

## 2021-11-18 DIAGNOSIS — E669 Obesity, unspecified: Secondary | ICD-10-CM

## 2021-11-18 DIAGNOSIS — Z7985 Long-term (current) use of injectable non-insulin antidiabetic drugs: Secondary | ICD-10-CM

## 2021-11-18 MED ORDER — SEMAGLUTIDE (1 MG/DOSE) 4 MG/3ML ~~LOC~~ SOPN
1.0000 mg | PEN_INJECTOR | SUBCUTANEOUS | 0 refills | Status: DC
  Filled 2021-11-18 – 2021-11-28 (×2): qty 9, 84d supply, fill #0

## 2021-11-18 MED ORDER — VITAMIN D (ERGOCALCIFEROL) 1.25 MG (50000 UNIT) PO CAPS
50000.0000 [IU] | ORAL_CAPSULE | ORAL | 0 refills | Status: DC
  Filled 2021-11-18 – 2021-11-28 (×2): qty 8, 56d supply, fill #0

## 2021-11-19 NOTE — Progress Notes (Signed)
Chief Complaint:   OBESITY [name] is here to discuss her progress with her obesity treatment plan along with follow-up of her obesity related diagnoses. [name] is on the Category 3 Plan and states she is following her eating plan approximately 96% of the time. [name] states she is not currently exercising.  Today's visit was #: 32 Starting weight: 205 lbs Starting date: 08/22/2020 Today's weight: 168 lbs Today's date: 11/18/21 Total lbs lost to date: 37 lbs Total lbs lost since last in-office visit: -1  Interim History:  11/04/2021 PCP office visit with Dr. Moshe Cipro. Labs that were completed- lipid panel/CMP/vitamin D completed.  04/14/2021 RMR 1498.   10/22/2021 RMR 1440-essentially unchanged.  Subjective:   1. SOB (shortness of breath) on exertion She reports dyspnea with extreme exertion, however denies CP with exertion.  2. Type 2 diabetes mellitus with other specified complication, without long-term current use of insulin (HCC) Well-controlled on Ozempic 1 mg once weekly. She denies mass in neck, dysphagia, dyspepsia, persistent hoarseness, abd pain, or N/V/Constipation.  3. Vitamin D deficiency 10/30/2021 vitamin D level-58.1-stable.  4. Hypertension associated with type 2 diabetes mellitus ([place]) Home BP readings-120s/70s. She denies fatigue.  Assessment/Plan:   1. SOB (shortness of breath) on exertion Check incentive calorimetry.  2. Type 2 diabetes mellitus with other specified complication, without long-term current use of insulin (HCC) Refill- Semaglutide, 1 MG/DOSE, 4 MG/3ML SOPN; Inject 1 mg into the skin once a week.  Dispense: 9 mL; Refill: 0  Check labs - Hemoglobin A1c - Insulin, random  3. Vitamin D deficiency Refill- Vitamin D, Ergocalciferol, (DRISDOL) 1.25 MG (50000 UNIT) CAPS capsule; Take 1 capsule by mouth every 7 days.  Dispense: 8 capsule; Refill: 0  4. Hypertension associated with type 2 diabetes mellitus ([place]) If symptoms of hypotension  develop-follow-up with PCP.  5. Obesity, current BMI 23.4 [name] is currently in the action stage of change. As such, her goal is to maintain weight for now. She has agreed to practicing portion control and making smarter food choices, such as increasing vegetables and decreasing simple carbohydrates.   Maintenance phase!!!  Exercise goals: All adults should avoid inactivity. Some physical activity is better than none, and adults who participate in any amount of physical activity gain some health benefits.  Behavioral modification strategies: increasing lean protein intake, decreasing simple carbohydrates, meal planning and cooking strategies, keeping healthy foods in the home, and planning for success.  [name] has agreed to follow-up with our clinic in 2 to 3 months.  She was informed of the importance of frequent follow-up visits to maximize her success with intensive lifestyle modifications for her multiple health conditions.   [name] was informed we would discuss her lab results at her next visit unless there is a critical issue that needs to be addressed sooner. [name] agreed to keep her next visit at the agreed upon time to discuss these results.  Objective:   Blood pressure 100/65, pulse 71, temperature 98.1 F (36.7 C), height '5\' 11"'$  (1.803 m), weight 168 lb (76.2 kg), SpO2 100 %. Body mass index is 23.43 kg/m.  General: Cooperative, alert, well developed, in no acute distress. HEENT: Conjunctivae and lids unremarkable. Cardiovascular: Regular rhythm.  Lungs: Normal work of breathing. Neurologic: No focal deficits.   Lab Results  Component Value Date   CREATININE 0.82 10/30/2021   BUN 16 10/30/2021   NA 140 10/30/2021   K 4.2 10/30/2021   CL 101 10/30/2021   CO2 24 10/30/2021   Lab  Results  Component Value Date   ALT 13 10/30/2021   AST 16 10/30/2021   ALKPHOS 65 10/30/2021   BILITOT 0.2 10/30/2021   Lab Results  Component Value Date   HGBA1C 5.2 07/09/2021    HGBA1C 5.0 04/15/2021   HGBA1C 5.8 (H) 08/22/2020   HGBA1C 5.4 03/15/2019   HGBA1C 5.4 08/23/2017   Lab Results  Component Value Date   INSULIN 8.6 07/09/2021   INSULIN 19.2 04/15/2021   INSULIN 7.8 08/22/2020   Lab Results  Component Value Date   TSH 1.250 08/22/2020   Lab Results  Component Value Date   CHOL 141 10/30/2021   HDL 65 10/30/2021   LDLCALC 62 10/30/2021   TRIG 72 10/30/2021   CHOLHDL 2.2 10/30/2021   Lab Results  Component Value Date   VD25OH 58.1 10/30/2021   VD25OH 67.6 08/18/2021   VD25OH 29.2 (L) 04/15/2021   Lab Results  Component Value Date   WBC 6.9 10/02/2021   HGB 12.9 10/02/2021   HCT 39.0 10/02/2021   MCV 90.7 10/02/2021   PLT 265 10/02/2021   Lab Results  Component Value Date   IRON 42 10/02/2021   TIBC 317 10/02/2021   FERRITIN 193 10/02/2021    Attestation Statements:   Reviewed by clinician on day of visit: allergies, medications, problem list, medical history, surgical history, family history, social history, and previous encounter notes.  I, Georgianne Fick, FNP, am acting as Location manager for Mina Marble, NP.  I have reviewed the above documentation for accuracy and completeness, and I agree with the above. -  Eily Louvier d. Stirling Orton, NP-C

## 2021-11-22 LAB — HEMOGLOBIN A1C
Est. average glucose Bld gHb Est-mCnc: 103 mg/dL
Hgb A1c MFr Bld: 5.2 % (ref 4.8–5.6)

## 2021-11-22 LAB — INSULIN, RANDOM: INSULIN: 28.7 u[IU]/mL — ABNORMAL HIGH (ref 2.6–24.9)

## 2021-11-27 ENCOUNTER — Inpatient Hospital Stay (HOSPITAL_COMMUNITY): Payer: No Typology Code available for payment source | Attending: Hematology

## 2021-11-27 DIAGNOSIS — E119 Type 2 diabetes mellitus without complications: Secondary | ICD-10-CM | POA: Diagnosis not present

## 2021-11-27 DIAGNOSIS — D509 Iron deficiency anemia, unspecified: Secondary | ICD-10-CM | POA: Diagnosis present

## 2021-11-27 DIAGNOSIS — D500 Iron deficiency anemia secondary to blood loss (chronic): Secondary | ICD-10-CM

## 2021-11-27 DIAGNOSIS — I10 Essential (primary) hypertension: Secondary | ICD-10-CM | POA: Insufficient documentation

## 2021-11-27 DIAGNOSIS — D72820 Lymphocytosis (symptomatic): Secondary | ICD-10-CM | POA: Insufficient documentation

## 2021-11-27 LAB — CBC WITH DIFFERENTIAL/PLATELET
Abs Immature Granulocytes: 0.02 10*3/uL (ref 0.00–0.07)
Basophils Absolute: 0 10*3/uL (ref 0.0–0.1)
Basophils Relative: 1 %
Eosinophils Absolute: 0.1 10*3/uL (ref 0.0–0.5)
Eosinophils Relative: 1 %
HCT: 39.8 % (ref 36.0–46.0)
Hemoglobin: 13 g/dL (ref 12.0–15.0)
Immature Granulocytes: 0 %
Lymphocytes Relative: 63 %
Lymphs Abs: 3.9 10*3/uL (ref 0.7–4.0)
MCH: 29.6 pg (ref 26.0–34.0)
MCHC: 32.7 g/dL (ref 30.0–36.0)
MCV: 90.7 fL (ref 80.0–100.0)
Monocytes Absolute: 0.6 10*3/uL (ref 0.1–1.0)
Monocytes Relative: 9 %
Neutro Abs: 1.6 10*3/uL — ABNORMAL LOW (ref 1.7–7.7)
Neutrophils Relative %: 26 %
Platelets: 228 10*3/uL (ref 150–400)
RBC: 4.39 MIL/uL (ref 3.87–5.11)
RDW: 12.9 % (ref 11.5–15.5)
WBC: 6.3 10*3/uL (ref 4.0–10.5)
nRBC: 0 % (ref 0.0–0.2)

## 2021-11-27 LAB — IRON AND TIBC
Iron: 61 ug/dL (ref 28–170)
Saturation Ratios: 19 % (ref 10.4–31.8)
TIBC: 315 ug/dL (ref 250–450)
UIBC: 254 ug/dL

## 2021-11-27 LAB — FERRITIN: Ferritin: 151 ng/mL (ref 11–307)

## 2021-11-28 ENCOUNTER — Other Ambulatory Visit (HOSPITAL_COMMUNITY): Payer: Self-pay

## 2021-12-01 ENCOUNTER — Telehealth: Payer: Self-pay | Admitting: Family Medicine

## 2021-12-01 ENCOUNTER — Other Ambulatory Visit: Payer: Self-pay | Admitting: Family Medicine

## 2021-12-01 ENCOUNTER — Encounter (INDEPENDENT_AMBULATORY_CARE_PROVIDER_SITE_OTHER): Payer: Self-pay

## 2021-12-01 ENCOUNTER — Telehealth (INDEPENDENT_AMBULATORY_CARE_PROVIDER_SITE_OTHER): Payer: Self-pay | Admitting: Adult Health

## 2021-12-01 ENCOUNTER — Other Ambulatory Visit (HOSPITAL_COMMUNITY): Payer: Self-pay

## 2021-12-01 MED ORDER — FAMOTIDINE 40 MG PO TABS
ORAL_TABLET | ORAL | 0 refills | Status: DC
  Filled 2021-12-01: qty 90, 90d supply, fill #0

## 2021-12-01 NOTE — Telephone Encounter (Signed)
[name] - Prior authorization approved for [employer]. Effective: 12/01/2021 to 12/01/2022. Patient sent approval message via mychart.

## 2021-12-01 NOTE — Telephone Encounter (Signed)
Pt called stating she received a focus plan EOB stating on DOS 11/04/2021 she was seen by Dr. Laural Golden. She has never seen Dr. Laural Golden. On that DOS she was Dr. Moshe Cipro. She is wanting to know if Dr. Laural Golden received the pmt for this or did Dr. Moshe Cipro??? Her call back number is [phone]

## 2021-12-02 ENCOUNTER — Other Ambulatory Visit (HOSPITAL_COMMUNITY): Payer: Self-pay

## 2021-12-03 ENCOUNTER — Encounter (HOSPITAL_BASED_OUTPATIENT_CLINIC_OR_DEPARTMENT_OTHER): Payer: Self-pay | Admitting: Obstetrics & Gynecology

## 2021-12-03 ENCOUNTER — Ambulatory Visit (INDEPENDENT_AMBULATORY_CARE_PROVIDER_SITE_OTHER): Payer: No Typology Code available for payment source

## 2021-12-03 ENCOUNTER — Ambulatory Visit (HOSPITAL_BASED_OUTPATIENT_CLINIC_OR_DEPARTMENT_OTHER): Payer: No Typology Code available for payment source | Admitting: Obstetrics & Gynecology

## 2021-12-03 VITALS — BP 107/56 | HR 71 | Ht 71.0 in | Wt 172.0 lb

## 2021-12-03 DIAGNOSIS — Z8742 Personal history of other diseases of the female genital tract: Secondary | ICD-10-CM

## 2021-12-03 DIAGNOSIS — D500 Iron deficiency anemia secondary to blood loss (chronic): Secondary | ICD-10-CM | POA: Diagnosis not present

## 2021-12-03 DIAGNOSIS — N921 Excessive and frequent menstruation with irregular cycle: Secondary | ICD-10-CM | POA: Diagnosis not present

## 2021-12-03 DIAGNOSIS — D250 Submucous leiomyoma of uterus: Secondary | ICD-10-CM | POA: Diagnosis not present

## 2021-12-03 NOTE — Progress Notes (Signed)
[name], [place] [id number]   CLINIC:  Medical Oncology/Hematology  PCP:  Fayrene Helper, MD [address], [address] [place] [address] [phone]   REASON FOR VISIT:  Follow-up for iron deficiency anemia   CURRENT THERAPY: Intermittent IV iron infusions (last Venofer 07/18/2021)   INTERVAL HISTORY:  [name] 51 y.o. female returns for routine follow-up of iron deficiency anemia.  She was last seen by Tarri Abernethy PA-C on 08/06/2021.  Her last IV iron infusion was on 07/18/2021.    At today's visit, she reports feeling fairly well.  She had Sonata ablation procedure on 08/11/2021 and reports that her menstrual cycles have been lighter and more irregular since that time.  Her last menstrual cycle was in April 2023 (2 months ago), and was not as heavy as her previous cycles were.  She continues to have occasional intermittent fatigue and "mental fog" but not as significant as in the past.  She denies any other sources of bleeding such as bright red blood per rectum or melena.  No pica, restless legs, headaches, chest pain, dyspnea on exertion, or syncope.  Regarding her persistent lymphocytosis, she has not noticed any recent fever, chills, night sweats, unintentional weight loss, lymphadenopathy, or infections.  She has 80% energy and 100% appetite. She endorses that she is maintaining a stable weight.   REVIEW OF SYSTEMS:    Review of Systems  Constitutional:  Positive for fatigue (mild). Negative for appetite change, chills, diaphoresis, fever and unexpected weight change.  HENT:   Negative for lump/mass and nosebleeds.   Eyes:  Negative for eye problems.  Respiratory:  Negative for cough, hemoptysis and shortness of breath.   Cardiovascular:  Negative for chest pain, leg swelling and palpitations.  Gastrointestinal:  Negative for abdominal pain, blood in stool, constipation, diarrhea, nausea and vomiting.  Genitourinary:   Negative for hematuria, menstrual problem and vaginal bleeding.   Musculoskeletal:  Negative for arthralgias.  Skin: Negative.   Neurological:  Negative for dizziness, headaches and light-headedness.  Hematological:  Does not bruise/bleed easily.      PAST MEDICAL/SURGICAL HISTORY:  Past Medical History:  Diagnosis Date   Anemia    Arthritis    Phreesia 03/29/2020   Back pain    DDD (degenerative disc disease), lumbar    Diabetes mellitus without complication ([place])    Phreesia 03/29/2020   Essential hypertension, benign    GERD (gastroesophageal reflux disease)    Hypertension    Phreesia 03/29/2020   Joint pain    Lactose intolerance    Lumbar herniated disc    Migraine headache 2007   Obesity    Since childhood    Osteoarthritis    Pneumonia 2007   PONV (postoperative nausea and vomiting)    Patient states she woke up during tubal ligation and has had PONV in the past.   Sacroiliac joint disease    Spinal disease    Supraventricular tachycardia [facility] [place] [facility])    Status post RFA July 2015 - Dr. Lovena Le   Type 2 diabetes mellitus ([place])    Varicose vein of leg    Vitamin D deficiency    Weight loss    Hopitalized 4 years ago after loosing 200lb on a supervised program, malnurished with severe protein deficiency    Past Surgical History:  Procedure Laterality Date   ABLATION  12-28-2013   slow pathway modification of AVNRT by Dr Lovena Le   COLONOSCOPY N/A 11/17/2019  Procedure: COLONOSCOPY;  Surgeon: Daneil Dolin, MD;  [facility];  Service: Endoscopy;  Laterality: N/A;  7:30   ELECTROPHYSIOLOGY STUDY N/A 12/28/2013   Procedure: ELECTROPHYSIOLOGY STUDY;  Surgeon: Evans Lance, MD;  Location: [facility] CATH LAB;  Service: Cardiovascular;  Laterality: N/A;   HYSTEROSCOPY WITH D & C N/A 08/19/2021   Procedure: DILATATION AND CURETTAGE /HYSTEROSCOPY;  Surgeon: Megan Salon, MD;  Location: [facility];  Service: Gynecology;  Laterality: N/A;   SUPRAVENTRICULAR TACHYCARDIA ABLATION  N/A 12/28/2013   Procedure: SUPRAVENTRICULAR TACHYCARDIA ABLATION;  Surgeon: Evans Lance, MD;  Location: [facility] C[facility] LAB;  Service: Cardiovascular;  Laterality: N/A;   TUBAL LIGATION  1996     SOCIAL HISTORY:  Social History   Socioeconomic History   Marital status: Married    Spouse name: Not on file   Number of children: 2   Years of education: Not on file   Highest education level: Not on file  Occupational History   Occupation: English as a second language teacher at [employer]   Smoking status: Former    Packs/day: 0.50    Years: 7.00    Total pack years: 3.50    Types: Cigarettes    Quit date: 08/05/2012    Years since quitting: 9.3   Smokeless tobacco: Never   Tobacco comments:    quit in 2014  Vaping Use   Vaping Use: Never used  Substance and Sexual Activity   Alcohol use: No    Alcohol/week: 0.0 standard drinks of alcohol   Drug use: No   Sexual activity: Yes    Birth control/protection: Surgical    Comment: tubal ligation  Other Topics Concern   Not on file  Social History Narrative   Not on file   Social Determinants of Health   Financial Resource Strain: Low Risk  (01/20/2021)   Overall Financial Resource Strain (CARDIA)    Difficulty of Paying Living Expenses: Not hard at all  Food Insecurity: No Food Insecurity (01/20/2021)   Hunger Vital Sign    Worried About Running Out of Food in the Last Year: Never true    [place] in the Last Year: Never true  Transportation Needs: No Transportation Needs (01/20/2021)   PRAPARE - Hydrologist (Medical): No    Lack of Transportation (Non-Medical): No  Physical Activity: Inactive (01/20/2021)   Exercise Vital Sign    Days of Exercise per Week: 0 days    Minutes of Exercise per Session: 0 min  Stress: No Stress Concern Present (01/20/2021)   [place]    Feeling of Stress : Not at all  Social Connections: Moderately Integrated (01/20/2021)    Social Connection and Isolation Panel [NHANES]    Frequency of Communication with Friends and Family: More than three times a week    Frequency of Social Gatherings with Friends and Family: Once a week    Attends Religious Services: 1 to 4 times per year    Active Member of [employer] or Organizations: No    Attends Archivist Meetings: Never    Marital Status: Married  Human resources officer Violence: Not At Risk (01/20/2021)   Humiliation, Afraid, Rape, and Kick questionnaire    Fear of Current or Ex-Partner: No    Emotionally Abused: No    Physically Abused: No    Sexually Abused: No    FAMILY HISTORY:  Family History  Problem Relation Age of  Onset   Hypertension Mother    Diabetes Mother    Cancer Mother        Uterine    Heart failure Mother        CABG   Hyperlipidemia Mother    Heart disease Mother    Thyroid disease Mother    Cancer Father 62       Prostate    Hypertension Father    Bradycardia Father        Irregular heart beat    Hyperlipidemia Father    Kidney disease Father    Diabetes Brother    Diabetes Brother    Hypertension Brother    Hypertension Brother     CURRENT MEDICATIONS:  Outpatient Encounter Medications as of 12/04/2021  Medication Sig Note   acetaminophen (TYLENOL) 500 MG tablet Take 1,000 mg by mouth in the morning and at bedtime. 11/04/2021: Takes one at bedtime 10/2021   cyclobenzaprine (FLEXERIL) 10 MG tablet TAKE 1 TABLET BY MOUTH AT BEDTIME    Diclofenac-miSOPROStol 50-0.2 MG TBEC TAKE 1 TABLET BY MOUTH TWICE DAILY AFTER A MEAL    famotidine (PEPCID) 40 MG tablet TAKE 1 TABLET BY MOUTH ONCE DAILY WHEN YOU TAKE IBUPROFEN    ferrous sulfate 325 (65 FE) MG tablet Take 325 mg by mouth every other day. 08/11/2021: Slow Iron   losartan-hydrochlorothiazide (HYZAAR) 50-12.5 MG tablet TAKE 1 TABLET BY MOUTH ONCE DAILY    naproxen (NAPROSYN) 500 MG tablet Take 1 tablet (500 mg total) by mouth 2 (two) times daily with a meal.    ondansetron  (ZOFRAN) 4 MG tablet Take 1 tablet (4 mg total) by mouth every 8 (eight) hours as needed for nausea or vomiting.    rosuvastatin (CRESTOR) 10 MG tablet Take 1 tablet by mouth 2 times weekly (Wed/Sun)    Semaglutide, 1 MG/DOSE, 4 MG/3ML SOPN Inject 1 mg into the skin once a week.    Vitamin D, Ergocalciferol, (DRISDOL) 1.25 MG (50000 UNIT) CAPS capsule Take 1 capsule by mouth every 7 days.    No facility-administered encounter medications on file as of 12/04/2021.    ALLERGIES:  Allergies  Allergen Reactions   Codeine Nausea And Vomiting   Keflex [Cephalexin] Nausea And Vomiting    Nausea and  diarrheah   Metoclopramide Hcl Other (See Comments)    Not in right state of mind     PHYSICAL EXAM:  ECOG PERFORMANCE STATUS: 0 - Asymptomatic   There were no vitals filed for this visit. There were no vitals filed for this visit. Physical Exam Constitutional:      Appearance: Normal appearance.  HENT:     Head: Normocephalic and atraumatic.     Mouth/Throat:     Mouth: Mucous membranes are moist.  Eyes:     Extraocular Movements: Extraocular movements intact.     Pupils: Pupils are equal, round, and reactive to light.  Cardiovascular:     Rate and Rhythm: Normal rate and regular rhythm.     Pulses: Normal pulses.     Heart sounds: Normal heart sounds.  Pulmonary:     Effort: Pulmonary effort is normal.     Breath sounds: Normal breath sounds.  Abdominal:     General: Bowel sounds are normal.     Palpations: Abdomen is soft.     Tenderness: There is no abdominal tenderness.  Musculoskeletal:        General: No swelling.     Right lower leg: No edema.  Left lower leg: No edema.  Lymphadenopathy:     Cervical: No cervical adenopathy.  Skin:    General: Skin is warm and dry.  Neurological:     General: No focal deficit present.     Mental Status: She is alert and oriented to person, place, and time.  Psychiatric:        Mood and Affect: Mood normal.        Behavior:  Behavior normal.      LABORATORY DATA:  I have reviewed the labs as listed.  CBC    Component Value Date/Time   WBC 6.3 11/27/2021 0958   RBC 4.39 11/27/2021 0958   HGB 13.0 11/27/2021 0958   HGB 9.9 (L) 09/23/2020 1501   HCT 39.8 11/27/2021 0958   HCT 31.9 (L) 09/23/2020 1501   PLT 228 11/27/2021 0958   PLT 281 09/23/2020 1501   MCV 90.7 11/27/2021 0958   MCV 77 (L) 09/23/2020 1501   MCH 29.6 11/27/2021 0958   MCHC 32.7 11/27/2021 0958   RDW 12.9 11/27/2021 0958   RDW 15.8 (H) 09/23/2020 1501   LYMPHSABS 3.9 11/27/2021 0958   LYMPHSABS 5.0 (H) 08/22/2020 0956   MONOABS 0.6 11/27/2021 0958   EOSABS 0.1 11/27/2021 0958   EOSABS 0.1 08/22/2020 0956   BASOSABS 0.0 11/27/2021 0958   BASOSABS 0.1 08/22/2020 0956      Latest Ref Rng & Units 10/30/2021    4:10 PM 08/13/2021    3:13 PM 07/09/2021    9:08 AM  CMP  Glucose 70 - 99 mg/dL 64  91  62   BUN 6 - 24 mg/dL '16  13  13   '$ Creatinine 0.57 - 1.00 mg/dL 0.82  0.88  0.85   Sodium 134 - 144 mmol/L 140  137  140   Potassium 3.5 - 5.2 mmol/L 4.2  3.8  4.2   Chloride 96 - 106 mmol/L 101  102  100   CO2 20 - 29 mmol/L '24  26  23   '$ Calcium 8.7 - 10.2 mg/dL 9.8  9.6  9.6   Total Protein 6.0 - 8.5 g/dL 8.4   7.9   Total Bilirubin 0.0 - 1.2 mg/dL 0.2   <0.2   Alkaline Phos 44 - 121 IU/L 65   47   AST 0 - 40 IU/L 16   14   ALT 0 - 32 IU/L 13   10     DIAGNOSTIC IMAGING:  I have independently reviewed the relevant imaging and discussed with the patient.  ASSESSMENT & PLAN: 1.  Iron deficiency anemia -  Secondary to chronic blood loss (menorrhagia) and malabsorption in the setting of PPI use - Episode of heavy bleeding in October 2022, reportedly bled for about 48 consecutive days. - She had Sonata ablation procedure in February 2023, and reports much lighter and more irregular menstrual cycle since that time - Failed to improve on oral iron tablets - Has received several rounds of IV Venofer, last given on 07/18/2021 -  Hemoccult stool x3 negative, denies melena or bright red blood per rectum     - Work-up for other causes of anemia unremarkable (normal methylmalonic acid, B12, folate, copper, LDH, IFE/SPEP/FLC); CMP (08/22/2020) showed normal creatinine 0.78, normal liver function. - Normal screening colonoscopy in May 2021; no history of EGD - Continues to have intermittent mild fatigue, but much improved from before - Most recent labs (11/27/2021): Hgb 13.0, ferritin 151, iron saturation 19% - PLAN: We will hold off on  any IV iron for the time being. - We will repeat CBC/iron panel in 6 months along with office visit. - Patient has been advised to call the clinic sooner if she starts to feel that her iron is low and needs labs checked.   2.  Lymphocytosis - Review of labs shows that she has had mild lymphocytosis since at least March 2022 - Pathology smear review confirmed absolute lymphocytosis - Flow cytometry (03/28/2021) showed predominance of T cells with relative abundance of CD8 positive cells, no monoclonal B-cell population identified.  T-cell changes are not considered specific or diagnostic of lymphoproliferative disorder at this time may be secondary to infection or autoimmune disease, etc.  Clinical correlation and follow-up recommended. - She denies any new lumps or bumps.  No fever, chills, night sweats, unintentional weight loss.     - Most recent CBC (11/27/2021): Lymphocytes have normalized at 3.9 - PLAN: We will continue to monitor with repeat CBCs and consider repeat flow cytometry in 6 to 12 months (October 2023) if persistent lymphocytosis.   3.  Other history - Her other past medical history is significant for chronic back pain, history of SVT s/p ablation, hypertension, diet-controlled diabetes mellitus type 2. 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  She is former smoker (10 pack-year history), quit over 10 years ago.  She denies alcohol or drug use. - Patient's mother had uterine caner,  father with prostate cancer.  No other known family history of anemia or cancers.   PLAN SUMMARY & DISPOSITION: - We will repeat CBC/iron panel as well as flow cytometry in 6 months, followed by office visit.  All questions were answered. The patient knows to call the clinic with any problems, questions or concerns.  Medical decision making: Low    Time spent on visit: I spent 15 minutes counseling the patient face to face. The total time spent in the appointment was 25 minutes and more than 50% was on counseling.   Harriett Rush, PA-C  12/04/2021 11:13 AM

## 2021-12-04 ENCOUNTER — Inpatient Hospital Stay (HOSPITAL_COMMUNITY): Payer: No Typology Code available for payment source | Admitting: Physician Assistant

## 2021-12-04 VITALS — BP 106/67 | HR 62 | Temp 96.7°F | Ht 70.28 in | Wt 170.9 lb

## 2021-12-04 DIAGNOSIS — D500 Iron deficiency anemia secondary to blood loss (chronic): Secondary | ICD-10-CM

## 2021-12-04 DIAGNOSIS — D72820 Lymphocytosis (symptomatic): Secondary | ICD-10-CM

## 2021-12-04 DIAGNOSIS — D509 Iron deficiency anemia, unspecified: Secondary | ICD-10-CM | POA: Diagnosis not present

## 2021-12-04 NOTE — Patient Instructions (Addendum)
[place] at [facility] Discharge Instructions  You were seen today by Tarri Abernethy PA-C for your IRON DEFICIENCY ANEMIA.  Your blood and iron levels look good right now.    Since you are not bleeding as much after you had your ablation procedure, I do not need to see you for follow-up for another 6 months.  However, if you have any symptoms of low iron or any recurrent episodes of severe bleeding, please call our office and we can schedule you for repeat labs and IV iron if needed.  FOLLOW-UP APPOINTMENT: Labs and office visit in 6 months   Thank you for choosing [place] at [facility] to provide your oncology and hematology care.  To afford each patient quality time with our provider, please arrive at least 15 minutes before your scheduled appointment time.   If you have a lab appointment with the [facility] please come in thru the Main Entrance and check in at the main information desk.  You need to re-schedule your appointment should you arrive 10 or more minutes late.  We strive to give you quality time with our providers, and arriving late affects you and other patients whose appointments are after yours.  Also, if you no show three or more times for appointments you may be dismissed from the clinic at the providers discretion.     Again, thank you for choosing [facility]  Our hope is that these requests will decrease the amount of time that you wait before being seen by our physicians.       _____________________________________________________________  Should you have questions after your visit to [facility], please contact our office at [phone] and follow the prompts.  Our office hours are 8:00 a.m. and 4:30 p.m. Monday - Friday.  Please note that voicemails left after 4:00 p.m. may not be returned until the following business day.  We are closed weekends and major holidays.  You do have access  to a nurse 24-7, just call the main number to the clinic [phone] and do not press any options, hold on the line and a nurse will answer the phone.    For prescription refill requests, have your pharmacy contact our office and allow 72 hours.    Due to Covid, you will need to wear a mask upon entering the hospital. If you do not have a mask, a mask will be given to you at the Main Entrance upon arrival. For doctor visits, patients may have 1 support person age 46 or older with them. For treatment visits, patients can not have anyone with them due to social distancing guidelines and our immunocompromised population.

## 2021-12-05 ENCOUNTER — Other Ambulatory Visit (HOSPITAL_COMMUNITY): Payer: Self-pay

## 2021-12-06 ENCOUNTER — Other Ambulatory Visit (HOSPITAL_COMMUNITY): Payer: Self-pay

## 2021-12-06 NOTE — Progress Notes (Signed)
GYNECOLOGY  VISIT  CC:   follow up after Sonata  HPI: 51 y.o. G2P0 Married [name] here for follow up after undergoing Sonata treatment of fibroid on 08/19/2021.  Reports she is doing well.  Bleeding is much improved.  Pt had hemoglobin and iron levels done in early June.  These were stable and she's not had to have any other iron infusions.    Ultrasound done today for follow up as well.  Findings showed fibroid has decreased from 4.2cm to 2.8cm.  She does have a simple cyst on the right ovary and possible resolving hemorrhagic cyst on the left.  Results reviewed with pt.    Past Medical History:  Diagnosis Date   Anemia    Arthritis    Phreesia 03/29/2020   Back pain    DDD (degenerative disc disease), lumbar    Diabetes mellitus without complication ([place])    Phreesia 03/29/2020   Essential hypertension, benign    GERD (gastroesophageal reflux disease)    Hypertension    Phreesia 03/29/2020   Joint pain    Lactose intolerance    Lumbar herniated disc    Migraine headache 2007   Obesity    Since childhood    Osteoarthritis    Pneumonia 2007   PONV (postoperative nausea and vomiting)    Patient states she woke up during tubal ligation and has had PONV in the past.   Sacroiliac joint disease    Spinal disease    Supraventricular tachycardia ([place])    Status post RFA July 2015 - Dr. Lovena Le   Type 2 diabetes mellitus ([place])    Varicose vein of leg    Vitamin D deficiency    Weight loss    Hopitalized 4 years ago after loosing 200lb on a supervised program, malnurished with severe protein deficiency     MEDS:   Current Outpatient Medications on File Prior to Visit  Medication Sig Dispense Refill   acetaminophen (TYLENOL) 500 MG tablet Take 1,000 mg by mouth in the morning and at bedtime.     cyclobenzaprine (FLEXERIL) 10 MG tablet TAKE 1 TABLET BY MOUTH AT BEDTIME 90 tablet 0   Diclofenac-miSOPROStol 50-0.2 MG TBEC TAKE 1 TABLET BY MOUTH TWICE DAILY  AFTER A MEAL 60 tablet 3   famotidine (PEPCID) 40 MG tablet TAKE 1 TABLET BY MOUTH ONCE DAILY WHEN YOU TAKE IBUPROFEN 90 tablet 0   ferrous sulfate 325 (65 FE) MG tablet Take 325 mg by mouth every other day.     losartan-hydrochlorothiazide (HYZAAR) 50-12.5 MG tablet TAKE 1 TABLET BY MOUTH ONCE DAILY 90 tablet 3   naproxen (NAPROSYN) 500 MG tablet Take 1 tablet (500 mg total) by mouth 2 (two) times daily with a meal. 60 tablet 1   ondansetron (ZOFRAN) 4 MG tablet Take 1 tablet (4 mg total) by mouth every 8 (eight) hours as needed for nausea or vomiting. 20 tablet 0   rosuvastatin (CRESTOR) 10 MG tablet Take 1 tablet by mouth 2 times weekly (Wed/Sun) 8 tablet 5   Semaglutide, 1 MG/DOSE, 4 MG/3ML SOPN Inject 1 mg into the skin once a week. 9 mL 0   Vitamin D, Ergocalciferol, (DRISDOL) 1.25 MG (50000 UNIT) CAPS capsule Take 1 capsule by mouth every 7 days. 8 capsule 0   No current facility-administered medications on file prior to visit.    ALLERGIES: Codeine, Keflex [cephalexin], and Metoclopramide hcl  SH:  married, non smoker  Review of Systems  Constitutional: Negative.  Genitourinary: Negative.     PHYSICAL EXAMINATION:    BP (!) 107/56 (BP Location: Left Arm, Patient Position: Sitting, Cuff Size: Large)   Pulse 71   Ht '5\' 11"'$  (1.803 m) Comment: Reported  Wt 172 lb (78 kg)   LMP 10/11/2021 Comment: Lasted 10 days  BMI 23.99 kg/m     General appearance: alert, cooperative and appears stated age  Assessment/Plan: 1. History of menorrhagia - much improved.  Pt knows to call if bleeding starts to worsen.  2. Fibroids, submucosal  3. Iron deficiency anemia due to chronic blood loss - stable with lab work earlier this month

## 2021-12-08 ENCOUNTER — Ambulatory Visit (HOSPITAL_COMMUNITY)
Admission: RE | Admit: 2021-12-08 | Discharge: 2021-12-08 | Disposition: A | Discharge status: Home / Self Care | Payer: No Typology Code available for payment source | Source: Ambulatory Visit | Attending: Family Medicine | Admitting: Family Medicine

## 2021-12-08 DIAGNOSIS — Z1231 Encounter for screening mammogram for malignant neoplasm of breast: Secondary | ICD-10-CM | POA: Diagnosis not present

## 2021-12-26 ENCOUNTER — Other Ambulatory Visit (HOSPITAL_COMMUNITY): Payer: Self-pay

## 2021-12-26 ENCOUNTER — Other Ambulatory Visit: Payer: Self-pay | Admitting: Family Medicine

## 2021-12-26 DIAGNOSIS — M549 Dorsalgia, unspecified: Secondary | ICD-10-CM

## 2021-12-29 ENCOUNTER — Other Ambulatory Visit: Payer: Self-pay

## 2021-12-29 ENCOUNTER — Other Ambulatory Visit (HOSPITAL_COMMUNITY): Payer: Self-pay

## 2021-12-29 ENCOUNTER — Encounter: Payer: Self-pay | Admitting: Family Medicine

## 2021-12-29 DIAGNOSIS — M5430 Sciatica, unspecified side: Secondary | ICD-10-CM

## 2021-12-29 MED ORDER — CYCLOBENZAPRINE HCL 10 MG PO TABS
10.0000 mg | ORAL_TABLET | Freq: Every day | ORAL | 0 refills | Status: DC
  Filled 2021-12-29: qty 90, 90d supply, fill #0

## 2021-12-29 NOTE — Telephone Encounter (Signed)
Refills sent to [facility] long outpatient pharmacy.

## 2022-01-10 ENCOUNTER — Other Ambulatory Visit: Payer: Self-pay | Admitting: Family Medicine

## 2022-01-12 ENCOUNTER — Other Ambulatory Visit (HOSPITAL_COMMUNITY): Payer: Self-pay

## 2022-01-12 MED ORDER — LOSARTAN POTASSIUM-HCTZ 50-12.5 MG PO TABS
1.0000 | ORAL_TABLET | Freq: Every day | ORAL | 3 refills | Status: DC
  Filled 2022-01-12: qty 90, 90d supply, fill #0
  Filled 2022-04-12: qty 90, 90d supply, fill #1

## 2022-01-13 ENCOUNTER — Other Ambulatory Visit (HOSPITAL_COMMUNITY): Payer: Self-pay

## 2022-01-19 ENCOUNTER — Other Ambulatory Visit (HOSPITAL_COMMUNITY): Payer: Self-pay

## 2022-01-19 ENCOUNTER — Encounter (INDEPENDENT_AMBULATORY_CARE_PROVIDER_SITE_OTHER): Payer: Self-pay | Admitting: Adult Health

## 2022-01-19 ENCOUNTER — Ambulatory Visit (INDEPENDENT_AMBULATORY_CARE_PROVIDER_SITE_OTHER): Payer: No Typology Code available for payment source | Admitting: Adult Health

## 2022-01-19 VITALS — BP 118/78 | HR 65 | Temp 97.7°F | Ht 71.0 in | Wt 165.0 lb

## 2022-01-19 DIAGNOSIS — E669 Obesity, unspecified: Secondary | ICD-10-CM

## 2022-01-19 DIAGNOSIS — E1169 Type 2 diabetes mellitus with other specified complication: Secondary | ICD-10-CM | POA: Diagnosis not present

## 2022-01-19 DIAGNOSIS — I152 Hypertension secondary to endocrine disorders: Secondary | ICD-10-CM | POA: Diagnosis not present

## 2022-01-19 DIAGNOSIS — Z7985 Long-term (current) use of injectable non-insulin antidiabetic drugs: Secondary | ICD-10-CM

## 2022-01-19 DIAGNOSIS — E1159 Type 2 diabetes mellitus with other circulatory complications: Secondary | ICD-10-CM | POA: Diagnosis not present

## 2022-01-19 DIAGNOSIS — Z6823 Body mass index (BMI) 23.0-23.9, adult: Secondary | ICD-10-CM

## 2022-01-19 MED ORDER — SEMAGLUTIDE (1 MG/DOSE) 4 MG/3ML ~~LOC~~ SOPN
1.0000 mg | PEN_INJECTOR | SUBCUTANEOUS | 0 refills | Status: DC
  Filled 2022-01-19: qty 3, 28d supply, fill #0
  Filled 2022-02-20: qty 9, 84d supply, fill #0

## 2022-01-26 NOTE — Progress Notes (Unsigned)
Chief Complaint:   OBESITY [name] is here to discuss her progress with her obesity treatment plan along with follow-up of her obesity related diagnoses. [name] is on practicing portion control and making smarter food choices, such as increasing vegetables and decreasing simple carbohydrates and states she is following her eating plan approximately 96% of the time. [name] states she is not exercising.  Today's visit was #: 29 Starting weight: 205 lbs Starting date: 08/22/2020 Today's weight: 165 lbs Today's date: 01/19/2022 Total lbs lost to date: 40 Total lbs lost since last in-office visit: 3  Interim History: [name] is happy where she is! She is in the maintenance phase with a BMI of 23.1. [name] is on Ozempic '1mg'$  and is taking her injection on Friday.  Subjective:   1. Type 2 diabetes mellitus with other specified complication, without long-term current use of insulin (HCC) [name]'s blood glucose ranges from 70 to 130 and typically runs in the 90's. She is on Ozempic '1mg'$  and denies any symptoms of hypoglycemia.  2. Hypertension associated with type 2 diabetes mellitus ([place]) [name]'s home readings are in the 110/70's. She is on losartan/HCTZ 50/12.'5mg'$  qd. [name] denies chest pain with exertion.  Assessment/Plan:   1. Type 2 diabetes mellitus with other specified complication, without long-term current use of insulin (HCC) [name] agrees to continue taking Ozempic '1mg'$  once a week with a 3 month supply. She will monitor for symptoms of hypoglycemia. If any develop, she will reach out to [facility].  - Semaglutide, 1 MG/DOSE, 4 MG/3ML SOPN; Inject 1 mg into the skin once a week.  Dispense: 9 mL; Refill: 0  2. Hypertension associated with type 2 diabetes mellitus ([name]) Bessie agrees to continue taking her ace/diuretic combo medication and will follow up at the agreed upon time.  3. Obesity, current BMI 23.1 [name] is currently in the action stage of change. As  such, her goal is to maintain weight for now. She has agreed to practicing portion control and making smarter food choices, such as increasing vegetables and decreasing simple carbohydrates.   Exercise goals:  Walking program.  Behavioral modification strategies: increasing lean protein intake, decreasing simple carbohydrates, meal planning and cooking strategies, keeping healthy foods in the home, and planning for success.  [name] has agreed to follow-up with our clinic in 12 weeks. She was informed of the importance of frequent follow-up visits to maximize her success with intensive lifestyle modifications for her multiple health conditions.   Objective:   Blood pressure 118/78, pulse 65, temperature 97.7 F (36.5 C), height '5\' 11"'$  (1.803 m), weight 165 lb (74.8 kg), SpO2 97 %. Body mass index is 23.01 kg/m.  General: Cooperative, alert, well developed, in no acute distress. HEENT: Conjunctivae and lids unremarkable. Cardiovascular: Regular rhythm.  Lungs: Normal work of breathing. Neurologic: No focal deficits.   Lab Results  Component Value Date   CREATININE 0.82 10/30/2021   BUN 16 10/30/2021   NA 140 10/30/2021   K 4.2 10/30/2021   CL 101 10/30/2021   CO2 24 10/30/2021   Lab Results  Component Value Date   ALT 13 10/30/2021   AST 16 10/30/2021   ALKPHOS 65 10/30/2021   BILITOT 0.2 10/30/2021   Lab Results  Component Value Date   HGBA1C 5.2 11/19/2021   HGBA1C 5.2 07/09/2021   HGBA1C 5.0 04/15/2021   HGBA1C 5.8 (H) 08/22/2020   HGBA1C 5.4 03/15/2019   Lab Results  Component Value Date   INSULIN 28.7 (H)  11/19/2021   INSULIN 8.6 07/09/2021   INSULIN 19.2 04/15/2021   INSULIN 7.8 08/22/2020   Lab Results  Component Value Date   TSH 1.250 08/22/2020   Lab Results  Component Value Date   CHOL 141 10/30/2021   HDL 65 10/30/2021   LDLCALC 62 10/30/2021   TRIG 72 10/30/2021   CHOLHDL 2.2 10/30/2021   Lab Results  Component Value Date   VD25OH 58.1  10/30/2021   VD25OH 67.6 08/18/2021   VD25OH 29.2 (L) 04/15/2021   Lab Results  Component Value Date   WBC 6.3 11/27/2021   HGB 13.0 11/27/2021   HCT 39.8 11/27/2021   MCV 90.7 11/27/2021   PLT 228 11/27/2021   Lab Results  Component Value Date   IRON 61 11/27/2021   TIBC 315 11/27/2021   FERRITIN 151 11/27/2021   Attestation Statements:   Reviewed by clinician on day of visit: allergies, medications, problem list, medical history, surgical history, family history, social history, and previous encounter notes.  I, Marcille Blanco, am acting as Location manager for Mina Marble, NP  I have reviewed the above documentation for accuracy and completeness, and I agree with the above. -  ***

## 2022-01-28 ENCOUNTER — Encounter (INDEPENDENT_AMBULATORY_CARE_PROVIDER_SITE_OTHER): Payer: Self-pay

## 2022-01-28 ENCOUNTER — Other Ambulatory Visit (HOSPITAL_COMMUNITY): Payer: Self-pay

## 2022-01-30 ENCOUNTER — Other Ambulatory Visit (HOSPITAL_COMMUNITY): Payer: Self-pay

## 2022-02-16 ENCOUNTER — Telehealth: Payer: Self-pay | Admitting: Orthopedic Surgery

## 2022-02-16 NOTE — Telephone Encounter (Signed)
Ok to schedule. Does not need prior auth

## 2022-02-16 NOTE — Telephone Encounter (Signed)
Patient put in request for bupivacaine injection in right shoulder please advise

## 2022-02-20 ENCOUNTER — Other Ambulatory Visit (HOSPITAL_COMMUNITY): Payer: Self-pay

## 2022-03-04 ENCOUNTER — Other Ambulatory Visit: Payer: Self-pay | Admitting: Family Medicine

## 2022-03-04 ENCOUNTER — Other Ambulatory Visit (HOSPITAL_COMMUNITY): Payer: Self-pay

## 2022-03-04 ENCOUNTER — Other Ambulatory Visit (INDEPENDENT_AMBULATORY_CARE_PROVIDER_SITE_OTHER): Payer: Self-pay | Admitting: Adult Health

## 2022-03-04 DIAGNOSIS — E559 Vitamin D deficiency, unspecified: Secondary | ICD-10-CM

## 2022-03-04 MED ORDER — FAMOTIDINE 40 MG PO TABS
ORAL_TABLET | ORAL | 0 refills | Status: DC
  Filled 2022-03-04: qty 90, 90d supply, fill #0

## 2022-03-05 ENCOUNTER — Encounter (INDEPENDENT_AMBULATORY_CARE_PROVIDER_SITE_OTHER): Payer: Self-pay

## 2022-03-05 ENCOUNTER — Other Ambulatory Visit (HOSPITAL_COMMUNITY): Payer: Self-pay

## 2022-03-05 MED ORDER — VITAMIN D (ERGOCALCIFEROL) 1.25 MG (50000 UNIT) PO CAPS
50000.0000 [IU] | ORAL_CAPSULE | ORAL | 0 refills | Status: DC
  Filled 2022-03-05: qty 8, 56d supply, fill #0

## 2022-03-11 ENCOUNTER — Ambulatory Visit: Payer: No Typology Code available for payment source | Admitting: Orthopedic Surgery

## 2022-03-11 ENCOUNTER — Ambulatory Visit: Payer: Self-pay

## 2022-03-11 DIAGNOSIS — M7501 Adhesive capsulitis of right shoulder: Secondary | ICD-10-CM

## 2022-03-11 DIAGNOSIS — M25511 Pain in right shoulder: Secondary | ICD-10-CM

## 2022-03-11 DIAGNOSIS — M25811 Other specified joint disorders, right shoulder: Secondary | ICD-10-CM | POA: Diagnosis not present

## 2022-03-14 ENCOUNTER — Encounter: Payer: Self-pay | Admitting: Orthopedic Surgery

## 2022-03-14 MED ORDER — METHYLPREDNISOLONE ACETATE 40 MG/ML IJ SUSP
40.0000 mg | INTRAMUSCULAR | Status: AC | PRN
  Administered 2022-03-11: 40 mg via INTRA_ARTICULAR

## 2022-03-14 MED ORDER — LIDOCAINE HCL 1 % IJ SOLN
5.0000 mL | INTRAMUSCULAR | Status: AC | PRN
  Administered 2022-03-11: 5 mL

## 2022-03-14 MED ORDER — BUPIVACAINE HCL 0.5 % IJ SOLN
9.0000 mL | INTRAMUSCULAR | Status: AC | PRN
  Administered 2022-03-11: 9 mL via INTRA_ARTICULAR

## 2022-03-14 NOTE — Progress Notes (Signed)
Office Visit Note   Patient: [name]           Date of Birth: 20-May-1971           MRN: [id number] Visit Date: 03/11/2022 Requested by: Fayrene Helper, MD [address] [place],  [place] [address] PCP: Fayrene Helper, MD  Subjective: Chief Complaint  Patient presents with   Right Shoulder - Pain    HPI: [name] is a 51 year old patient with right shoulder pain.  She had a glenohumeral injection performed 09/17/2021 which did help her symptoms.  Diagnosis was adhesive capsulitis at that time.  Hemoglobin A1c 5.3.  Started taking again at the end of August.  She still is exercising and moving the shoulder.  Hard for her to sleep on the right-hand side.  Last May range of motion on the right was 70/110/170.  Denies any mechanical symptoms in the shoulder.              ROS: All systems reviewed are negative as they relate to the chief complaint within the history of present illness.  Patient denies  fevers or chills.   Assessment & Plan: Visit Diagnoses:  1. Right shoulder pain, unspecified chronicity     Plan: Impression is recurrent right shoulder adhesive capsulitis.  Shoulder range of motion passively has diminished.  Repeat glenohumeral injection performed today.  Continue with diclofenac as well as stretching exercises.  This would be the second injection.  For persistent or worsening symptoms of pain or stiffness we would necessarily consider further observation or surgical intervention which would consist of manipulation rotator interval release and intense use of CPM machine in the postop period  Follow-Up Instructions: No follow-ups on file.   Orders:  Orders Placed This Encounter  Procedures   US Guided Needle Placement - No Linked Charges   No orders of the defined types were placed in this encounter.     Procedures: Large Joint Inj: R glenohumeral on 03/11/2022 1:04 PM Indications: diagnostic evaluation and pain Details: 18 G 1.5 in  needle, ultrasound-guided posterior approach  Arthrogram: No  Medications: 9 mL bupivacaine 0.5 %; 40 mg methylPREDNISolone acetate 40 MG/ML; 5 mL lidocaine 1 % Outcome: tolerated well, no immediate complications Procedure, treatment alternatives, risks and benefits explained, specific risks discussed. Consent was given by the patient. Immediately prior to procedure a time out was called to verify the correct patient, procedure, equipment, support staff and site/side marked as required. Patient was prepped and draped in the usual sterile fashion.       Clinical Data: No additional findings.  Objective: Vital Signs: There were no vitals taken for this visit.  Physical Exam:   Constitutional: Patient appears well-developed HEENT:  Head: Normocephalic Eyes:EOM are normal Neck: Normal range of motion Cardiovascular: Normal rate Pulmonary/chest: Effort normal Neurologic: Patient is alert Skin: Skin is warm Psychiatric: Patient has normal mood and affect   Ortho Exam: Ortho exam demonstrates passive range of motion on the right to 45/80/140.  This compares to passive range of motion in May of 70/110/170.  Rotator cuff strength on the right intact infraspinatus supraspinatus and subscap muscle testing.  No coarse grinding or crepitus noted with passive range of motion of that right shoulder.  No other masses lymphadenopathy or skin changes noted in the shoulder girdle region.  No discrete AC joint tenderness is present.  Motor or sensory function of the hand is intact.  Cervical spine range of motion is full.  Specialty Comments:  No specialty comments available.  Imaging: No results found.   PMFS History: Patient Active Problem List   Diagnosis Date Noted   Phlebitis after infusion 08/31/2021   Fibroids, submucosal 06/17/2021   Diabetes mellitus ([place]) 05/26/2021   Vitamin D deficiency 05/26/2021   Overweight (BMI 25.0-29.9) 05/26/2021   Shoulder pain, right 02/12/2021    Peri-menopause 01/20/2021   Pre-diabetes 11/19/2020   Iron deficiency anemia due to chronic blood loss 10/10/2020   Symptomatic varicose veins, bilateral 09/01/2018   Dyspepsia 01/18/2017   Back pain with sciatica 09/20/2014   Solitary lung nodule 09/05/2014   Hyperlipidemia with target low density lipoprotein (LDL) cholesterol less than 100 mg/dL 01/26/2014   Encounter for annual physical exam 04/18/2012   Allergic rhinitis 09/08/2010   OTH ABNORMAL BRAIN & CNS FUNCTION STUDY 02/16/2010   GOITER, UNSPECIFIED 02/10/2010   Class 1 obesity with serious comorbidity and body mass index (BMI) of 30.0 to 30.9 in adult 02/10/2010   Hypertension associated with type 2 diabetes mellitus ([place]) 05/02/2009   Past Medical History:  Diagnosis Date   Anemia    Arthritis    Phreesia 03/29/2020   Back pain    DDD (degenerative disc disease), lumbar    Diabetes mellitus without complication ([place])    Phreesia 03/29/2020   Essential hypertension, benign    GERD (gastroesophageal reflux disease)    Hypertension    Phreesia 03/29/2020   Joint pain    Lactose intolerance    Lumbar herniated disc    Migraine headache 2007   Obesity    Since childhood    Osteoarthritis    Pneumonia 2007   PONV (postoperative nausea and vomiting)    Patient states she woke up during tubal ligation and has had PONV in the past.   Sacroiliac joint disease    Spinal disease    Supraventricular tachycardia ([place])    Status post RFA July 2015 - Dr. Lovena Le   Type 2 diabetes mellitus ([place])    Varicose vein of leg    Vitamin D deficiency    Weight loss    Hopitalized 4 years ago after loosing 200lb on a supervised program, malnurished with severe protein deficiency     Family History  Problem Relation Age of Onset   Hypertension Mother    Diabetes Mother    Cancer Mother        Uterine    Heart failure Mother        CABG   Hyperlipidemia Mother    Heart disease Mother    Thyroid disease Mother    Cancer  Father 11       Prostate    Hypertension Father    Bradycardia Father        Irregular heart beat    Hyperlipidemia Father    Kidney disease Father    Diabetes Brother    Diabetes Brother    Hypertension Brother    Hypertension Brother     Past Surgical History:  Procedure Laterality Date   ABLATION  12-28-2013   slow pathway modification of AVNRT by Dr Lovena Le   COLONOSCOPY N/A 11/17/2019   Procedure: COLONOSCOPY;  Surgeon: Daneil Dolin, MD;  [facility];  Service: Endoscopy;  Laterality: N/A;  7:30   ELECTROPHYSIOLOGY STUDY N/A 12/28/2013   Procedure: ELECTROPHYSIOLOGY STUDY;  Surgeon: Evans Lance, MD;  Location: [facility] CATH LAB;  Service: Cardiovascular;  Laterality: N/A;   HYSTEROSCOPY WITH D & C N/A 08/19/2021  Procedure: DILATATION AND CURETTAGE /HYSTEROSCOPY;  Surgeon: Megan Salon, MD;  Location: [facility];  Service: Gynecology;  Laterality: N/A;   SUPRAVENTRICULAR TACHYCARDIA ABLATION N/A 12/28/2013   Procedure: SUPRAVENTRICULAR TACHYCARDIA ABLATION;  Surgeon: Evans Lance, MD;  Location: [facility] C[facility] LAB;  Service: Cardiovascular;  Laterality: N/A;   TUBAL LIGATION  1996   Social History   Occupational History   Occupation: English as a second language teacher at [employer]   Smoking status: Former    Packs/day: 0.50    Years: 7.00    Total pack years: 3.50    Types: Cigarettes    Quit date: 08/05/2012    Years since quitting: 9.6   Smokeless tobacco: Never   Tobacco comments:    quit in 2014  Vaping Use   Vaping Use: Never used  Substance and Sexual Activity   Alcohol use: No    Alcohol/week: 0.0 standard drinks of alcohol   Drug use: No   Sexual activity: Yes    Birth control/protection: Surgical    Comment: tubal ligation

## 2022-04-01 ENCOUNTER — Other Ambulatory Visit: Payer: Self-pay | Admitting: Specialist

## 2022-04-01 ENCOUNTER — Other Ambulatory Visit (HOSPITAL_COMMUNITY): Payer: Self-pay

## 2022-04-02 ENCOUNTER — Other Ambulatory Visit (HOSPITAL_COMMUNITY): Payer: Self-pay

## 2022-04-03 ENCOUNTER — Other Ambulatory Visit (HOSPITAL_COMMUNITY): Payer: Self-pay

## 2022-04-03 MED ORDER — DICLOFENAC-MISOPROSTOL 50-0.2 MG PO TBEC
DELAYED_RELEASE_TABLET | ORAL | 3 refills | Status: DC
  Filled 2022-04-03: qty 60, 30d supply, fill #0
  Filled 2022-07-21: qty 60, 30d supply, fill #1
  Filled 2022-09-29 – 2022-09-30 (×2): qty 60, 30d supply, fill #2
  Filled 2022-10-30: qty 60, 30d supply, fill #3

## 2022-04-06 ENCOUNTER — Other Ambulatory Visit (HOSPITAL_COMMUNITY): Payer: Self-pay

## 2022-04-06 ENCOUNTER — Telehealth (INDEPENDENT_AMBULATORY_CARE_PROVIDER_SITE_OTHER): Payer: No Typology Code available for payment source | Admitting: Family Medicine

## 2022-04-06 ENCOUNTER — Encounter (INDEPENDENT_AMBULATORY_CARE_PROVIDER_SITE_OTHER): Payer: Self-pay | Admitting: Family Medicine

## 2022-04-06 ENCOUNTER — Ambulatory Visit (INDEPENDENT_AMBULATORY_CARE_PROVIDER_SITE_OTHER): Payer: No Typology Code available for payment source | Admitting: Adult Health

## 2022-04-06 DIAGNOSIS — Z6830 Body mass index (BMI) 30.0-30.9, adult: Secondary | ICD-10-CM

## 2022-04-06 DIAGNOSIS — E669 Obesity, unspecified: Secondary | ICD-10-CM

## 2022-04-06 DIAGNOSIS — E559 Vitamin D deficiency, unspecified: Secondary | ICD-10-CM

## 2022-04-06 DIAGNOSIS — E1159 Type 2 diabetes mellitus with other circulatory complications: Secondary | ICD-10-CM | POA: Diagnosis not present

## 2022-04-06 DIAGNOSIS — E1169 Type 2 diabetes mellitus with other specified complication: Secondary | ICD-10-CM | POA: Diagnosis not present

## 2022-04-06 DIAGNOSIS — I152 Hypertension secondary to endocrine disorders: Secondary | ICD-10-CM | POA: Diagnosis not present

## 2022-04-06 DIAGNOSIS — Z6823 Body mass index (BMI) 23.0-23.9, adult: Secondary | ICD-10-CM

## 2022-04-06 DIAGNOSIS — Z7985 Long-term (current) use of injectable non-insulin antidiabetic drugs: Secondary | ICD-10-CM

## 2022-04-06 MED ORDER — SEMAGLUTIDE (1 MG/DOSE) 4 MG/3ML ~~LOC~~ SOPN
1.0000 mg | PEN_INJECTOR | SUBCUTANEOUS | 0 refills | Status: DC
  Filled 2022-04-06: qty 9, 84d supply, fill #0
  Filled 2022-05-24: qty 3, 28d supply, fill #0
  Filled 2022-06-20: qty 3, 28d supply, fill #1

## 2022-04-06 MED ORDER — VITAMIN D (ERGOCALCIFEROL) 1.25 MG (50000 UNIT) PO CAPS
50000.0000 [IU] | ORAL_CAPSULE | ORAL | 0 refills | Status: DC
  Filled 2022-04-06 – 2022-05-20 (×2): qty 12, 84d supply, fill #0

## 2022-04-06 NOTE — Progress Notes (Signed)
TeleHealth Visit:  This visit was completed with telemedicine (audio/video) technology. [name] has verbally consented to this TeleHealth visit. The patient is located at home, the provider is located at home. The participants in this visit include the listed provider and patient. The visit was conducted today via MyChart video.  OBESITY [name] is here to discuss her progress with her obesity treatment plan along with follow-up of her obesity related diagnoses.   Today's visit was # 18 Starting weight: 205 lbs Starting date: 08/22/2020 Weight at last in office visit: 165 lbs on 01/19/22 Total weight loss: 40 lbs at last in office visit on 01/19/22. Today's reported weight: 161 lbs   Goal 160-165 lbs  Nutrition Plan: practicing portion control and making smarter food choices, such as increasing vegetables and decreasing simple carbohydrates.   Current exercise: walking 4 days per week for 35 minutes (2.5 miles).  Interim History: [name] is doing an excellent job with weight maintenance.  Her weight fluctuates between 160 and 165 pounds.  Today it is 161 pounds.    She eats 3 meals per day with protein at every meal.  She has been walking consistently since August 4 days/week for 35 minutes.    She drinks plenty of water per day, no sugar sweetened beverages.  She has an upcoming visit with her PCP who will be doing labs.  Assessment/Plan:  1. Type II Diabetes HgbA1c is at goal. Last A1c was 5.2.  A1c back in 2015 was 8.7. CBGs:  Runs 98-120.  Checks once weekly Episodes of hypoglycemia: no Medication(s): Ozempic 1 mg weekly.  Appetite well controlled.  Denies side effects.  Lab Results  Component Value Date   HGBA1C 5.2 11/19/2021   HGBA1C 5.2 07/09/2021   HGBA1C 5.0 04/15/2021   Lab Results  Component Value Date   MICROALBUR <0.2 08/23/2017   LDLCALC 62 10/30/2021   CREATININE 0.82 10/30/2021    Plan: Refill Ozempic 1 mg weekly-90-day supply sent. Check A1c  next office visit if not done by PCP.   2. Hypertension associated with type 2 diabetes. Hypertension well controlled.  Medication(s): Losartan-HCTZ 50-12.5 mg daily.  Home blood pressures run: Systolic 053-976B, diastolic 34L. Denies chest pain and SOB.  BP Readings from Last 3 Encounters:  01/19/22 118/78  12/04/21 106/67  12/03/21 (!) 107/56   Lab Results  Component Value Date   CREATININE 0.82 10/30/2021   CREATININE 0.88 08/13/2021   CREATININE 0.85 07/09/2021    Plan: Continue losartan/HCTZ 50-12.5 mg daily.  3. Vitamin D Deficiency Vitamin D is at goal of 50.  Last vitamin D was 58.1 on 10/30/2021. She is on weekly prescription Vitamin D 50,000 IU.  Lab Results  Component Value Date   VD25OH 58.1 10/30/2021   VD25OH 67.6 08/18/2021   VD25OH 29.2 (L) 04/15/2021    Plan: Refill prescription vitamin D 50,000 IU weekly. Check vitamin D level next office visit.   4. Obesity: Current BMI 23.1 [name] is currently in the action stage of change. As such, her goal is to maintain weight for now.  She has agreed to practicing portion control and making smarter food choices, such as increasing vegetables and decreasing simple carbohydrates.   Exercise goals: as is  Behavioral modification strategies: increasing lean protein intake, decreasing simple carbohydrates, and planning for success.  [name] has agreed to follow-up with our clinic in 12 weeks.   No orders of the defined types were placed in this encounter.   Medications Discontinued During This Encounter  Medication  Reason   Semaglutide, 1 MG/DOSE, 4 MG/3ML SOPN Reorder   Vitamin D, Ergocalciferol, (DRISDOL) 1.25 MG (50000 UNIT) CAPS capsule Reorder     Meds ordered this encounter  Medications   Semaglutide, 1 MG/DOSE, 4 MG/3ML SOPN    Sig: Inject 1 mg into the skin once a week.    Dispense:  9 mL    Refill:  0    Order Specific Question:   Supervising Provider    Answer:   Netty Starring    Vitamin D, Ergocalciferol, (DRISDOL) 1.25 MG (50000 UNIT) CAPS capsule    Sig: Take 1 capsule (50,000 Units total) by mouth every 7 (seven) days.    Dispense:  12 capsule    Refill:  0    Order Specific Question:   Supervising Provider    Answer:   Dell Ponto [[id number]]      Objective:   VITALS: Per patient if applicable, see vitals. GENERAL: Alert and in no acute distress. CARDIOPULMONARY: No increased WOB. Speaking in clear sentences.  PSYCH: Pleasant and cooperative. Speech normal rate and rhythm. Affect is appropriate. Insight and judgement are appropriate. Attention is focused, linear, and appropriate.  NEURO: Oriented as arrived to appointment on time with no prompting.   Lab Results  Component Value Date   CREATININE 0.82 10/30/2021   BUN 16 10/30/2021   NA 140 10/30/2021   K 4.2 10/30/2021   CL 101 10/30/2021   CO2 24 10/30/2021   Lab Results  Component Value Date   ALT 13 10/30/2021   AST 16 10/30/2021   ALKPHOS 65 10/30/2021   BILITOT 0.2 10/30/2021   Lab Results  Component Value Date   HGBA1C 5.2 11/19/2021   HGBA1C 5.2 07/09/2021   HGBA1C 5.0 04/15/2021   HGBA1C 5.8 (H) 08/22/2020   HGBA1C 5.4 03/15/2019   Lab Results  Component Value Date   INSULIN 28.7 (H) 11/19/2021   INSULIN 8.6 07/09/2021   INSULIN 19.2 04/15/2021   INSULIN 7.8 08/22/2020   Lab Results  Component Value Date   TSH 1.250 08/22/2020   Lab Results  Component Value Date   CHOL 141 10/30/2021   HDL 65 10/30/2021   LDLCALC 62 10/30/2021   TRIG 72 10/30/2021   CHOLHDL 2.2 10/30/2021   Lab Results  Component Value Date   WBC 6.3 11/27/2021   HGB 13.0 11/27/2021   HCT 39.8 11/27/2021   MCV 90.7 11/27/2021   PLT 228 11/27/2021   Lab Results  Component Value Date   IRON 61 11/27/2021   TIBC 315 11/27/2021   FERRITIN 151 11/27/2021   Lab Results  Component Value Date   VD25OH 58.1 10/30/2021   VD25OH 67.6 08/18/2021   VD25OH 29.2 (L) 04/15/2021    Attestation  Statements:   Reviewed by clinician on day of visit: allergies, medications, problem list, medical history, surgical history, family history, social history, and previous encounter notes.

## 2022-04-11 ENCOUNTER — Other Ambulatory Visit: Payer: Self-pay | Admitting: Family Medicine

## 2022-04-11 DIAGNOSIS — M5430 Sciatica, unspecified side: Secondary | ICD-10-CM

## 2022-04-13 ENCOUNTER — Other Ambulatory Visit (HOSPITAL_COMMUNITY): Payer: Self-pay

## 2022-04-13 MED ORDER — CYCLOBENZAPRINE HCL 10 MG PO TABS
10.0000 mg | ORAL_TABLET | Freq: Every day | ORAL | 0 refills | Status: DC
  Filled 2022-04-13: qty 90, 90d supply, fill #0

## 2022-04-22 ENCOUNTER — Other Ambulatory Visit: Payer: Self-pay | Admitting: Family Medicine

## 2022-04-22 DIAGNOSIS — E785 Hyperlipidemia, unspecified: Secondary | ICD-10-CM

## 2022-04-23 ENCOUNTER — Other Ambulatory Visit (HOSPITAL_COMMUNITY): Payer: Self-pay

## 2022-04-23 MED ORDER — ROSUVASTATIN CALCIUM 10 MG PO TABS
ORAL_TABLET | ORAL | 5 refills | Status: DC
  Filled 2022-04-23: qty 8, 28d supply, fill #0
  Filled 2022-05-24: qty 8, 28d supply, fill #1
  Filled 2022-06-20: qty 8, 28d supply, fill #2
  Filled 2022-07-21: qty 8, 28d supply, fill #3
  Filled 2022-08-17: qty 8, 28d supply, fill #4
  Filled 2022-09-21: qty 8, 28d supply, fill #5

## 2022-05-01 LAB — CMP14+EGFR
ALT: 19 IU/L (ref 0–32)
AST: 16 IU/L (ref 0–40)
Albumin/Globulin Ratio: 1.8 (ref 1.2–2.2)
Albumin: 5 g/dL — ABNORMAL HIGH (ref 3.8–4.9)
Alkaline Phosphatase: 57 IU/L (ref 44–121)
BUN/Creatinine Ratio: 20 (ref 9–23)
BUN: 16 mg/dL (ref 6–24)
Bilirubin Total: 0.3 mg/dL (ref 0.0–1.2)
CO2: 25 mmol/L (ref 20–29)
Calcium: 9.9 mg/dL (ref 8.7–10.2)
Chloride: 101 mmol/L (ref 96–106)
Creatinine, Ser: 0.79 mg/dL (ref 0.57–1.00)
Globulin, Total: 2.8 g/dL (ref 1.5–4.5)
Glucose: 79 mg/dL (ref 70–99)
Potassium: 4 mmol/L (ref 3.5–5.2)
Sodium: 141 mmol/L (ref 134–144)
Total Protein: 7.8 g/dL (ref 6.0–8.5)
eGFR: 90 mL/min/{1.73_m2} (ref >59)

## 2022-05-01 LAB — LIPID PANEL
Chol/HDL Ratio: 2.3 ratio (ref 0.0–4.4)
Cholesterol, Total: 148 mg/dL (ref 100–199)
HDL: 64 mg/dL (ref >39)
LDL Chol Calc (NIH): 71 mg/dL (ref 0–99)
Triglycerides: 64 mg/dL (ref 0–149)
VLDL Cholesterol Cal: 13 mg/dL (ref 5–40)

## 2022-05-01 LAB — TSH: TSH: 1.45 u[IU]/mL (ref 0.450–4.500)

## 2022-05-07 ENCOUNTER — Ambulatory Visit (INDEPENDENT_AMBULATORY_CARE_PROVIDER_SITE_OTHER): Payer: No Typology Code available for payment source | Admitting: Family Medicine

## 2022-05-07 ENCOUNTER — Encounter: Payer: Self-pay | Admitting: Family Medicine

## 2022-05-07 ENCOUNTER — Other Ambulatory Visit (HOSPITAL_COMMUNITY): Payer: Self-pay

## 2022-05-07 VITALS — BP 97/60 | HR 66 | Ht 71.0 in | Wt 164.0 lb

## 2022-05-07 DIAGNOSIS — F320 Major depressive disorder, single episode, mild: Secondary | ICD-10-CM | POA: Diagnosis not present

## 2022-05-07 DIAGNOSIS — F411 Generalized anxiety disorder: Secondary | ICD-10-CM | POA: Diagnosis not present

## 2022-05-07 DIAGNOSIS — I10 Essential (primary) hypertension: Secondary | ICD-10-CM

## 2022-05-07 DIAGNOSIS — Z0000 Encounter for general adult medical examination without abnormal findings: Secondary | ICD-10-CM | POA: Diagnosis not present

## 2022-05-07 MED ORDER — ESCITALOPRAM OXALATE 10 MG PO TABS
10.0000 mg | ORAL_TABLET | Freq: Every day | ORAL | 2 refills | Status: DC
  Filled 2022-05-07: qty 30, 30d supply, fill #0

## 2022-05-07 MED ORDER — LOSARTAN POTASSIUM 25 MG PO TABS
25.0000 mg | ORAL_TABLET | Freq: Every day | ORAL | 3 refills | Status: DC
  Filled 2022-05-07: qty 30, 30d supply, fill #0
  Filled 2022-07-21: qty 30, 30d supply, fill #1
  Filled 2022-09-01: qty 30, 30d supply, fill #2
  Filled 2022-09-29: qty 30, 30d supply, fill #3

## 2022-05-07 NOTE — Patient Instructions (Signed)
Fr/U in 6 to 8 weeks , re evaluate blood pressure, depression and anxiety, call if you need me sooner  Stop current bP med, blood pressure is low,new is cozaar 25 mg daily  New for anxiety and depression is lexapro 10 mg daily  Nurse pls give info for her to speak with employee assistance for therapy for depression and anxiety and prolonged grief  Thanks for choosing [facility], we consider it a privelige to serve you.

## 2022-05-08 ENCOUNTER — Other Ambulatory Visit (HOSPITAL_COMMUNITY): Payer: Self-pay

## 2022-05-09 ENCOUNTER — Encounter: Payer: Self-pay | Admitting: Family Medicine

## 2022-05-09 DIAGNOSIS — I10 Essential (primary) hypertension: Secondary | ICD-10-CM | POA: Insufficient documentation

## 2022-05-09 DIAGNOSIS — F411 Generalized anxiety disorder: Secondary | ICD-10-CM | POA: Insufficient documentation

## 2022-05-09 DIAGNOSIS — F320 Major depressive disorder, single episode, mild: Secondary | ICD-10-CM | POA: Insufficient documentation

## 2022-05-09 NOTE — Assessment & Plan Note (Signed)
Start lexapro 10 mg daily encouraged to encouraged to reach out to [facility] for mental health help

## 2022-05-09 NOTE — Assessment & Plan Note (Addendum)
PHQ 9 score of 11 in 04/2022, not suicidal or homicidal, start lexapro 10 mg daily

## 2022-05-09 NOTE — Assessment & Plan Note (Signed)

## 2022-05-09 NOTE — Assessment & Plan Note (Signed)
Overcorrected, reduction in med dose with close follow up

## 2022-05-09 NOTE — Progress Notes (Signed)
    [name]     MRN: [id number]      DOB: 05-Jan-1971  HPI: Patient is in for annual physical exam. Hypotension, depression and anxiety are addressed. Recent labs,  are reviewed. Immunization is reviewed , and  updated if needed.   PE:BP 97/60 (BP Location: Right Arm, Patient Position: Sitting, Cuff Size: Normal)   Pulse 66   Ht '5\' 11"'$  (1.803 m)   Wt 164 lb 0.6 oz (74.4 kg)   SpO2 95%   BMI 22.88 kg/m   Pleasant  female, alert and oriented x 3, in no cardio-pulmonary distress. Afebrile. HEENT No facial trauma or asymetry. Sinuses non tender.  Extra occullar muscles intact.. External ears normal, . Neck: supple, no adenopathy,JVD or thyromegaly.No bruits.  Chest: Clear to ascultation bilaterally.No crackles or wheezes. Non tender to palpation   Cardiovascular system; Heart sounds normal,  S1 and  S2 ,no S3.  No murmur, or thrill. Apical beat not displaced Peripheral pulses normal.  Abdomen: Soft, non tender,.    Musculoskeletal exam: Full ROM of spine, hips , shoulders and knees. No deformity ,swelling or crepitus noted. No muscle wasting or atrophy.   Neurologic: Cranial nerves 2 to 12 intact. Power, tone ,sensation and reflexes normal throughout. No disturbance in gait. No tremor.  Skin: Intact, no ulceration, erythema , scaling or rash noted. Pigmentation normal throughout  Psych; Npositive depression and anxiety screen, not suicidal or homicidal Assessment & Plan:  Encounter for annual physical exam Annual exam as documented. Counseling done  re healthy lifestyle involving commitment to 150 minutes exercise per week, heart healthy diet, and attaining healthy weight.The importance of adequate sleep also discussed. Regular seat belt use and home safety, is also discussed. Changes in health habits are decided on by the patient with goals and time frames  set for achieving them. Immunization and cancer screening needs are specifically addressed  at this visit.   Hypertension Overcorrected, reduction in med dose with close follow up  Depression, major, single episode, mild (HCC) PHQ 9 score of 11 in 04/2022, not suicidal or homicidal, start lexapro 10 mg daily   GAD (generalized anxiety disorder) Start lexapro 10 mg daily encouraged to encouraged to reach out to [facility] for mental health help

## 2022-05-21 ENCOUNTER — Other Ambulatory Visit (HOSPITAL_COMMUNITY): Payer: Self-pay

## 2022-05-25 ENCOUNTER — Other Ambulatory Visit (HOSPITAL_COMMUNITY): Payer: Self-pay

## 2022-05-26 ENCOUNTER — Other Ambulatory Visit (HOSPITAL_COMMUNITY): Payer: Self-pay

## 2022-06-01 ENCOUNTER — Encounter (HOSPITAL_COMMUNITY): Payer: Self-pay | Admitting: Hematology

## 2022-06-02 ENCOUNTER — Other Ambulatory Visit (HOSPITAL_COMMUNITY): Payer: Self-pay

## 2022-06-02 ENCOUNTER — Other Ambulatory Visit: Payer: Self-pay | Admitting: Nurse Practitioner

## 2022-06-02 MED ORDER — FAMOTIDINE 40 MG PO TABS
ORAL_TABLET | ORAL | 0 refills | Status: DC
  Filled 2022-06-02: qty 90, 90d supply, fill #0

## 2022-06-04 ENCOUNTER — Inpatient Hospital Stay: Payer: No Typology Code available for payment source | Attending: Physician Assistant

## 2022-06-04 DIAGNOSIS — R5383 Other fatigue: Secondary | ICD-10-CM | POA: Diagnosis not present

## 2022-06-04 DIAGNOSIS — I10 Essential (primary) hypertension: Secondary | ICD-10-CM | POA: Diagnosis not present

## 2022-06-04 DIAGNOSIS — E119 Type 2 diabetes mellitus without complications: Secondary | ICD-10-CM | POA: Insufficient documentation

## 2022-06-04 DIAGNOSIS — D72820 Lymphocytosis (symptomatic): Secondary | ICD-10-CM | POA: Diagnosis not present

## 2022-06-04 DIAGNOSIS — D509 Iron deficiency anemia, unspecified: Secondary | ICD-10-CM | POA: Insufficient documentation

## 2022-06-04 DIAGNOSIS — D500 Iron deficiency anemia secondary to blood loss (chronic): Secondary | ICD-10-CM

## 2022-06-04 LAB — CBC WITH DIFFERENTIAL/PLATELET
Abs Immature Granulocytes: 0.01 10*3/uL (ref 0.00–0.07)
Basophils Absolute: 0.1 10*3/uL (ref 0.0–0.1)
Basophils Relative: 1 %
Eosinophils Absolute: 0.1 10*3/uL (ref 0.0–0.5)
Eosinophils Relative: 2 %
HCT: 35.6 % — ABNORMAL LOW (ref 36.0–46.0)
Hemoglobin: 12 g/dL (ref 12.0–15.0)
Immature Granulocytes: 0 %
Lymphocytes Relative: 56 %
Lymphs Abs: 3.4 10*3/uL (ref 0.7–4.0)
MCH: 30.2 pg (ref 26.0–34.0)
MCHC: 33.7 g/dL (ref 30.0–36.0)
MCV: 89.4 fL (ref 80.0–100.0)
Monocytes Absolute: 0.5 10*3/uL (ref 0.1–1.0)
Monocytes Relative: 8 %
Neutro Abs: 2 10*3/uL (ref 1.7–7.7)
Neutrophils Relative %: 33 %
Platelets: 205 10*3/uL (ref 150–400)
RBC: 3.98 MIL/uL (ref 3.87–5.11)
RDW: 12.6 % (ref 11.5–15.5)
WBC: 6 10*3/uL (ref 4.0–10.5)
nRBC: 0 % (ref 0.0–0.2)

## 2022-06-04 LAB — IRON AND TIBC
Iron: 56 ug/dL (ref 28–170)
Saturation Ratios: 19 % (ref 10.4–31.8)
TIBC: 299 ug/dL (ref 250–450)
UIBC: 243 ug/dL

## 2022-06-04 LAB — FERRITIN: Ferritin: 146 ng/mL (ref 11–307)

## 2022-06-05 LAB — SURGICAL PATHOLOGY

## 2022-06-06 ENCOUNTER — Encounter (HOSPITAL_COMMUNITY): Payer: Self-pay | Admitting: Hematology

## 2022-06-10 ENCOUNTER — Other Ambulatory Visit: Payer: Self-pay

## 2022-06-10 ENCOUNTER — Encounter (HOSPITAL_COMMUNITY): Payer: Self-pay | Admitting: Hematology

## 2022-06-10 ENCOUNTER — Inpatient Hospital Stay: Payer: No Typology Code available for payment source | Admitting: Physician Assistant

## 2022-06-10 VITALS — BP 107/77 | HR 71 | Temp 98.0°F | Resp 16 | Ht 71.0 in | Wt 157.7 lb

## 2022-06-10 DIAGNOSIS — D500 Iron deficiency anemia secondary to blood loss (chronic): Secondary | ICD-10-CM

## 2022-06-10 DIAGNOSIS — D72820 Lymphocytosis (symptomatic): Secondary | ICD-10-CM

## 2022-06-10 DIAGNOSIS — D509 Iron deficiency anemia, unspecified: Secondary | ICD-10-CM | POA: Diagnosis not present

## 2022-06-10 NOTE — Patient Instructions (Signed)
[name] at [facility] Discharge Instructions  You were seen today by Tarri Abernethy PA-C for your IRON DEFICIENCY ANEMIA.  Your blood and iron levels look good right now.    Since you are not bleeding as much after you had your ablation procedure, I do not need to see you for follow-up for another year.  However, if you have any symptoms of low iron or any recurrent episodes of severe bleeding, please call our office and we can schedule you for repeat labs and IV iron if needed.  FOLLOW-UP APPOINTMENT: Labs and office visit in 1 year   Thank you for choosing [place] at [facility] to provide your oncology and hematology care.  To afford each patient quality time with our provider, please arrive at least 15 minutes before your scheduled appointment time.   If you have a lab appointment with the [facility] please come in thru the Main Entrance and check in at the main information desk.  You need to re-schedule your appointment should you arrive 10 or more minutes late.  We strive to give you quality time with our providers, and arriving late affects you and other patients whose appointments are after yours.  Also, if you no show three or more times for appointments you may be dismissed from the clinic at the providers discretion.     Again, thank you for choosing [facility].  Our hope is that these requests will decrease the amount of time that you wait before being seen by our physicians.       _____________________________________________________________  Should you have questions after your visit to [facility], please contact our office at [phone] and follow the prompts.  Our office hours are 8:00 a.m. and 4:30 p.m. Monday - Friday.  Please note that voicemails left after 4:00 p.m. may not be returned until the following business day.  We are closed weekends and major holidays.  You do have access to a  nurse 24-7, just call the main number to the clinic [phone] and do not press any options, hold on the line and a nurse will answer the phone.    For prescription refill requests, have your pharmacy contact our office and allow 72 hours.    Due to Covid, you will need to wear a mask upon entering the hospital. If you do not have a mask, a mask will be given to you at the Main Entrance upon arrival. For doctor visits, patients may have 1 support person age 24 or older with them. For treatment visits, patients can not have anyone with them due to social distancing guidelines and our immunocompromised population.

## 2022-06-10 NOTE — Progress Notes (Signed)
[name], [place] [address]   CLINIC:  Medical Oncology/Hematology  PCP:  Fayrene Helper, MD [address], [address] [place] [address] [phone]   REASON FOR VISIT:  Follow-up for iron deficiency anemia   CURRENT THERAPY: Intermittent IV iron infusions (last Venofer 07/18/2021)   INTERVAL HISTORY:  [name] 51 y.o. female returns for routine follow-up of iron deficiency anemia.  She was last seen by Tarri Abernethy PA-C on 12/04/2021.  Her last IV iron infusion was on 07/18/2021.    At today's visit, she reports feeling well.  She had Sonata ablation procedure on 08/11/2021 and reports that her menstrual cycles have been lighter and more irregular since that time.  Her last menstrual cycle was in October 2023 (2 months ago), and was not as heavy as her previous cycles were.   She continues to have occasional intermittent fatigue and "mental fog" which she attributes to working too hard.  She denies any other sources of bleeding such as bright red blood per rectum or melena.  No pica, restless legs, headaches, chest pain, dyspnea on exertion, or syncope.  Her previously noted lymphocytosis has resolved.   She has not noticed any recent fever, chills, night sweats, unintentional weight loss, lymphadenopathy, or infections.  She has 85% energy and 100% appetite. She endorses that she is maintaining a stable weight.   REVIEW OF SYSTEMS:    Review of Systems  Constitutional:  Positive for fatigue (mild). Negative for appetite change, chills, diaphoresis, fever and unexpected weight change.  HENT:   Negative for lump/mass and nosebleeds.   Eyes:  Negative for eye problems.  Respiratory:  Negative for cough, hemoptysis and shortness of breath.   Cardiovascular:  Negative for chest pain, leg swelling and palpitations.  Gastrointestinal:  Negative for abdominal pain, blood in stool, constipation, diarrhea, nausea and vomiting.   Genitourinary:  Negative for hematuria, menstrual problem and vaginal bleeding.   Musculoskeletal:  Negative for arthralgias.  Skin: Negative.   Neurological:  Negative for dizziness, headaches and light-headedness.  Hematological:  Does not bruise/bleed easily.      PAST MEDICAL/SURGICAL HISTORY:  Past Medical History:  Diagnosis Date   Anemia    Arthritis    Phreesia 03/29/2020   Back pain    DDD (degenerative disc disease), lumbar    Diabetes mellitus without complication ([place])    Phreesia 03/29/2020   Essential hypertension, benign    GERD (gastroesophageal reflux disease)    Hypertension    Phreesia 03/29/2020   Joint pain    Lactose intolerance    Lumbar herniated disc    Migraine headache 2007   Obesity    Since childhood    Osteoarthritis    Pneumonia 2007   PONV (postoperative nausea and vomiting)    Patient states she woke up during tubal ligation and has had PONV in the past.   Sacroiliac joint disease    Spinal disease    Supraventricular tachycardia    Status post RFA July 2015 - Dr. Lovena Le   Type 2 diabetes mellitus ([facility])    Varicose vein of leg    Vitamin D deficiency    Weight loss    Hopitalized 4 years ago after loosing 200lb on a supervised program, malnurished with severe protein deficiency    Past Surgical History:  Procedure Laterality Date   ABLATION  12-28-2013   slow pathway modification of AVNRT by Dr Lovena Le   COLONOSCOPY N/A 11/17/2019  Procedure: COLONOSCOPY;  Surgeon: Daneil Dolin, MD;  Location: [facility];  Service: Endoscopy;  Laterality: N/A;  7:30   ELECTROPHYSIOLOGY STUDY N/A 12/28/2013   Procedure: ELECTROPHYSIOLOGY STUDY;  Surgeon: Evans Lance, MD;  Location: [facility] CATH LAB;  Service: Cardiovascular;  Laterality: N/A;   HYSTEROSCOPY WITH D & C N/A 08/19/2021   Procedure: DILATATION AND CURETTAGE /HYSTEROSCOPY;  Surgeon: Megan Salon, MD;  Location: [facility];  Service: Gynecology;  Laterality: N/A;   SUPRAVENTRICULAR  TACHYCARDIA ABLATION N/A 12/28/2013   Procedure: SUPRAVENTRICULAR TACHYCARDIA ABLATION;  Surgeon: Evans Lance, MD;  Location: [facility] CATH LAB;  Service: Cardiovascular;  Laterality: N/A;   TUBAL LIGATION  1996     SOCIAL HISTORY:  Social History   Socioeconomic History   Marital status: Married    Spouse name: Not on file   Number of children: 2   Years of education: Not on file   Highest education level: Not on file  Occupational History   Occupation: English as a second language teacher at [employer]   Smoking status: Former    Packs/day: 0.50    Years: 7.00    Total pack years: 3.50    Types: Cigarettes    Quit date: 08/05/2012    Years since quitting: 9.8   Smokeless tobacco: Never   Tobacco comments:    quit in 2014  Vaping Use   Vaping Use: Never used  Substance and Sexual Activity   Alcohol use: No    Alcohol/week: 0.0 standard drinks of alcohol   Drug use: No   Sexual activity: Yes    Birth control/protection: Surgical    Comment: tubal ligation  Other Topics Concern   Not on file  Social History Narrative   Not on file   Social Determinants of Health   Financial Resource Strain: Low Risk  (01/20/2021)   Overall Financial Resource Strain (CARDIA)    Difficulty of Paying Living Expenses: Not hard at all  Food Insecurity: No Food Insecurity (01/20/2021)   Hunger Vital Sign    Worried About Running Out of Food in the Last Year: Never true    [place] in the Last Year: Never true  Transportation Needs: No Transportation Needs (01/20/2021)   PRAPARE - Hydrologist (Medical): No    Lack of Transportation (Non-Medical): No  Physical Activity: Inactive (01/20/2021)   Exercise Vital Sign    Days of Exercise per Week: 0 days    Minutes of Exercise per Session: 0 min  Stress: No Stress Concern Present (01/20/2021)   [place]    Feeling of Stress : Not at all  Social Connections: Moderately  Integrated (01/20/2021)   Social Connection and Isolation Panel [NHANES]    Frequency of Communication with Friends and Family: More than three times a week    Frequency of Social Gatherings with Friends and Family: Once a week    Attends Religious Services: 1 to 4 times per year    Active Member of [employer] or Organizations: No    Attends Archivist Meetings: Never    Marital Status: Married  Human resources officer Violence: Not At Risk (01/20/2021)   Humiliation, Afraid, Rape, and Kick questionnaire    Fear of Current or Ex-Partner: No    Emotionally Abused: No    Physically Abused: No    Sexually Abused: No    FAMILY HISTORY:  Family History  Problem Relation Age of  Onset   Hypertension Mother    Diabetes Mother    Cancer Mother        Uterine    Heart failure Mother        CABG   Hyperlipidemia Mother    Heart disease Mother    Thyroid disease Mother    Cancer Father 82       Prostate    Hypertension Father    Bradycardia Father        Irregular heart beat    Hyperlipidemia Father    Kidney disease Father    Diabetes Brother    Diabetes Brother    Hypertension Brother    Hypertension Brother     CURRENT MEDICATIONS:  Outpatient Encounter Medications as of 06/10/2022  Medication Sig Note   acetaminophen (TYLENOL) 500 MG tablet Take 1,000 mg by mouth in the morning and at bedtime. 11/04/2021: Takes one at bedtime 10/2021   cyclobenzaprine (FLEXERIL) 10 MG tablet Take 1 tablet (10 mg total) by mouth at bedtime.    Diclofenac-miSOPROStol 50-0.2 MG TBEC TAKE 1 TABLET BY MOUTH TWICE DAILY AFTER A MEAL    escitalopram (LEXAPRO) 10 MG tablet Take 1 tablet (10 mg total) by mouth daily.    famotidine (PEPCID) 40 MG tablet TAKE 1 TABLET BY MOUTH ONCE DAILY WHEN YOU TAKE IBUPROFEN    ferrous sulfate 325 (65 FE) MG tablet Take 325 mg by mouth every other day. 08/11/2021: Slow Iron   losartan (COZAAR) 25 MG tablet Take 1 tablet (25 mg total) by mouth daily.    rosuvastatin  (CRESTOR) 10 MG tablet Take 1 tablet by mouth 2 times weekly (Wed/Sun)    Semaglutide, 1 MG/DOSE, 4 MG/3ML SOPN Inject 1 mg into the skin once a week.    Vitamin D, Ergocalciferol, (DRISDOL) 1.25 MG (50000 UNIT) CAPS capsule Take 1 capsule by mouth every 7 days.    No facility-administered encounter medications on file as of 06/10/2022.    ALLERGIES:  Allergies  Allergen Reactions   Codeine Nausea And Vomiting   Keflex [Cephalexin] Nausea And Vomiting    Nausea and  diarrheah   Metoclopramide Hcl Other (See Comments)    Not in right state of mind     PHYSICAL EXAM:  ECOG PERFORMANCE STATUS: 0 - Asymptomatic   There were no vitals filed for this visit. There were no vitals filed for this visit. Physical Exam Constitutional:      Appearance: Normal appearance.  HENT:     Head: Normocephalic and atraumatic.     Mouth/Throat:     Mouth: Mucous membranes are moist.  Eyes:     Extraocular Movements: Extraocular movements intact.     Pupils: Pupils are equal, round, and reactive to light.  Cardiovascular:     Rate and Rhythm: Normal rate and regular rhythm.     Pulses: Normal pulses.     Heart sounds: Normal heart sounds.  Pulmonary:     Effort: Pulmonary effort is normal.     Breath sounds: Normal breath sounds.  Abdominal:     General: Bowel sounds are normal.     Palpations: Abdomen is soft.     Tenderness: There is no abdominal tenderness.  Musculoskeletal:        General: No swelling.     Right lower leg: No edema.     Left lower leg: No edema.  Lymphadenopathy:     Cervical: No cervical adenopathy.  Skin:    General: Skin is warm and dry.  Neurological:     General: No focal deficit present.     Mental Status: She is alert and oriented to person, place, and time.  Psychiatric:        Mood and Affect: Mood normal.        Behavior: Behavior normal.      LABORATORY DATA:  I have reviewed the labs as listed.  CBC    Component Value Date/Time   WBC 6.0  06/04/2022 0801   RBC 3.98 06/04/2022 0801   HGB 12.0 06/04/2022 0801   HGB 9.9 (L) 09/23/2020 1501   HCT 35.6 (L) 06/04/2022 0801   HCT 31.9 (L) 09/23/2020 1501   PLT 205 06/04/2022 0801   PLT 281 09/23/2020 1501   MCV 89.4 06/04/2022 0801   MCV 77 (L) 09/23/2020 1501   MCH 30.2 06/04/2022 0801   MCHC 33.7 06/04/2022 0801   RDW 12.6 06/04/2022 0801   RDW 15.8 (H) 09/23/2020 1501   LYMPHSABS 3.4 06/04/2022 0801   LYMPHSABS 5.0 (H) 08/22/2020 0956   MONOABS 0.5 06/04/2022 0801   EOSABS 0.1 06/04/2022 0801   EOSABS 0.1 08/22/2020 0956   BASOSABS 0.1 06/04/2022 0801   BASOSABS 0.1 08/22/2020 0956      Latest Ref Rng & Units 04/30/2022    8:08 AM 10/30/2021    4:10 PM 08/13/2021    3:13 PM  CMP  Glucose 70 - 99 mg/dL 79  64  91   BUN 6 - 24 mg/dL '16  16  13   '$ Creatinine 0.57 - 1.00 mg/dL 0.79  0.82  0.88   Sodium 134 - 144 mmol/L 141  140  137   Potassium 3.5 - 5.2 mmol/L 4.0  4.2  3.8   Chloride 96 - 106 mmol/L 101  101  102   CO2 20 - 29 mmol/L '25  24  26   '$ Calcium 8.7 - 10.2 mg/dL 9.9  9.8  9.6   Total Protein 6.0 - 8.5 g/dL 7.8  8.4    Total Bilirubin 0.0 - 1.2 mg/dL 0.3  0.2    Alkaline Phos 44 - 121 IU/L 57  65    AST 0 - 40 IU/L 16  16    ALT 0 - 32 IU/L 19  13      DIAGNOSTIC IMAGING:  I have independently reviewed the relevant imaging and discussed with the patient.  ASSESSMENT & PLAN: 1.  Iron deficiency anemia -  Secondary to chronic blood loss (menorrhagia) and malabsorption in the setting of PPI use - Episode of extreme menstrual bleeding in October 2022, reportedly bled for about 48 consecutive days. - She had Sonata ablation procedure in February 2023, and reports much lighter and more irregular menstrual cycle since that time - Failed to improve on oral iron tablets - Has received several rounds of IV Venofer, last given on 07/18/2021 - Hemoccult stool x3 negative, denies melena or bright red blood per rectum     - Work-up for other causes of anemia  unremarkable (normal methylmalonic acid, B12, folate, copper, LDH, IFE/SPEP/FLC); CMP (08/22/2020) showed normal creatinine 0.78, normal liver function. - Normal screening colonoscopy in May 2021; no history of EGD - Continues to have intermittent mild fatigue, but much improved from before - Most recent labs (06/04/2022): Hgb 12.0, ferritin 146, iron saturation 19% - PLAN: No indication for IV iron at this time - We will repeat CBC/iron panel in 1 year with office visit.  If she remains stable at annual follow-up, would consider  discharge from clinic now that the underlying source of her blood loss has been dealt with (GYN). - Patient has been advised to call the clinic sooner if she starts to feel that her iron is low and needs labs checked.   2.  Lymphocytosis, RESOLVED - Review of labs shows that she has had mild lymphocytosis since at least March 2022 - Pathology smear review confirmed absolute lymphocytosis - Flow cytometry (03/28/2021) showed predominance of T cells with relative abundance of CD8 positive cells, no monoclonal B-cell population identified.  T-cell changes are not considered specific or diagnostic of lymphoproliferative disorder at this time may be secondary to infection or autoimmune disease, etc.  Clinical correlation and follow-up recommended. - Repeat flow cytometry (06/04/2022) shows no abnormal B or T-cell population identified - CBC shows normalization of lymphocytosis since June 2023 with most recent CBC (06/04/2022) showing absolute lymphocytes 3.4 - She denies any new lumps or bumps.  No fever, chills, night sweats, unintentional weight loss.     - PLAN: No further workup at this time   3.  Other history - Her other past medical history is significant for chronic back pain, history of SVT s/p ablation, hypertension, diet-controlled diabetes mellitus type 2. 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  She is former smoker (10 pack-year history), quit over 10 years ago.  She  denies alcohol or drug use. - Patient's mother had uterine caner, father with prostate cancer.  No other known family history of anemia or cancers.   PLAN SUMMARY: >> Labs in 1 year (CBC/D, ferritin, iron/TIBC >> OFFICE visit after labs   All questions were answered. The patient knows to call the clinic with any problems, questions or concerns.  Medical decision making: Low    Time spent on visit: I spent 15 minutes counseling the patient face to face. The total time spent in the appointment was 25 minutes and more than 50% was on counseling.   Harriett Rush, PA-C  06/10/22 8:58 AM

## 2022-06-11 ENCOUNTER — Ambulatory Visit: Payer: No Typology Code available for payment source | Admitting: Physician Assistant

## 2022-06-12 LAB — FLOW CYTOMETRY

## 2022-06-18 ENCOUNTER — Ambulatory Visit: Payer: No Typology Code available for payment source | Admitting: Family Medicine

## 2022-06-20 ENCOUNTER — Other Ambulatory Visit (HOSPITAL_COMMUNITY): Payer: Self-pay

## 2022-06-23 ENCOUNTER — Other Ambulatory Visit: Payer: Self-pay

## 2022-06-29 ENCOUNTER — Encounter (HOSPITAL_COMMUNITY): Payer: Self-pay | Admitting: Hematology

## 2022-06-29 ENCOUNTER — Encounter (INDEPENDENT_AMBULATORY_CARE_PROVIDER_SITE_OTHER): Payer: Self-pay | Admitting: Adult Health

## 2022-06-29 ENCOUNTER — Ambulatory Visit (INDEPENDENT_AMBULATORY_CARE_PROVIDER_SITE_OTHER): Payer: 59 | Admitting: Adult Health

## 2022-06-29 ENCOUNTER — Other Ambulatory Visit (HOSPITAL_COMMUNITY): Payer: Self-pay

## 2022-06-29 VITALS — BP 106/69 | HR 77 | Temp 98.1°F | Ht 71.0 in | Wt 158.0 lb

## 2022-06-29 DIAGNOSIS — Z7985 Long-term (current) use of injectable non-insulin antidiabetic drugs: Secondary | ICD-10-CM

## 2022-06-29 DIAGNOSIS — E1169 Type 2 diabetes mellitus with other specified complication: Secondary | ICD-10-CM | POA: Diagnosis not present

## 2022-06-29 DIAGNOSIS — Z6830 Body mass index (BMI) 30.0-30.9, adult: Secondary | ICD-10-CM

## 2022-06-29 DIAGNOSIS — Z6822 Body mass index (BMI) 22.0-22.9, adult: Secondary | ICD-10-CM | POA: Diagnosis not present

## 2022-06-29 DIAGNOSIS — E669 Obesity, unspecified: Secondary | ICD-10-CM | POA: Diagnosis not present

## 2022-06-29 DIAGNOSIS — E559 Vitamin D deficiency, unspecified: Secondary | ICD-10-CM | POA: Diagnosis not present

## 2022-06-29 MED ORDER — SEMAGLUTIDE (1 MG/DOSE) 4 MG/3ML ~~LOC~~ SOPN
1.0000 mg | PEN_INJECTOR | SUBCUTANEOUS | 0 refills | Status: DC
  Filled 2022-06-29: qty 9, 84d supply, fill #0
  Filled 2022-07-21: qty 3, 28d supply, fill #0
  Filled 2022-08-17: qty 3, 28d supply, fill #1

## 2022-06-30 ENCOUNTER — Encounter (INDEPENDENT_AMBULATORY_CARE_PROVIDER_SITE_OTHER): Payer: Self-pay | Admitting: Adult Health

## 2022-06-30 ENCOUNTER — Other Ambulatory Visit (INDEPENDENT_AMBULATORY_CARE_PROVIDER_SITE_OTHER): Payer: Self-pay | Admitting: Adult Health

## 2022-06-30 DIAGNOSIS — E559 Vitamin D deficiency, unspecified: Secondary | ICD-10-CM

## 2022-06-30 LAB — HEMOGLOBIN A1C
Est. average glucose Bld gHb Est-mCnc: 103 mg/dL
Hgb A1c MFr Bld: 5.2 % (ref 4.8–5.6)

## 2022-06-30 LAB — VITAMIN D 25 HYDROXY (VIT D DEFICIENCY, FRACTURES): Vit D, 25-Hydroxy: 60 ng/mL (ref 30.0–100.0)

## 2022-06-30 MED ORDER — VITAMIN D (ERGOCALCIFEROL) 1.25 MG (50000 UNIT) PO CAPS: ORAL_CAPSULE | ORAL | 0 refills | Status: DC

## 2022-07-06 ENCOUNTER — Other Ambulatory Visit: Payer: Self-pay | Admitting: Family Medicine

## 2022-07-06 DIAGNOSIS — M549 Dorsalgia, unspecified: Secondary | ICD-10-CM

## 2022-07-06 NOTE — Progress Notes (Signed)
Chief Complaint:   OBESITY [name] is here to discuss her progress with her obesity treatment plan along with follow-up of her obesity related diagnoses. [name] is on practicing portion control and making smarter food choices, such as increasing vegetables and decreasing simple carbohydrates and states she is following her eating plan approximately 98% of the time. [name] states she is not exercising.  Today's visit was #: 13 Starting weight: 205 lbs Starting date: 08/22/2020 Today's weight: 158 LBS Today's date: 06/29/2022 Total lbs lost to date: 23 LBS Total lbs lost since last in-office visit: 7 LBS  Interim History:  [name] has been in maintenance phase summer 2023. She has lost 47 lbs. Current weight 158 with corresponding BMI 22 She reports wearing between size clothes 9-11. She denies fatigue or weakness.  Subjective:   1. Vitamin D deficiency Patient is currently on weekly ergocalciferol.   Patient denies nausea, vomiting, or muscle weakness.  2. Type 2 diabetes mellitus with other specified complication, without long-term current use of insulin (HCC) Lab Results  Component Value Date   HGBA1C 5.2 06/29/2022   HGBA1C 5.2 11/19/2021   HGBA1C 5.2 07/09/2021   A1c back in 2015 was 8.7.  Patient is currently on weekly Ozempic 1 mg- denies mass in neck, dysphagia, dyspepsia, persistent hoarseness, abdominal pain, or N/V/Constipation.  Assessment/Plan:   1. Vitamin D deficiency Check labs then MyChart patient with results. Refill ergocalciferol as appropriate.  - VITAMIN D 25 Hydroxy (Vit-D Deficiency, Fractures)  2. Type 2 diabetes mellitus with other specified complication, without long-term current use of insulin (HCC) If more weight loss at next office visit, decrease Ozempic to 0.5 mg weekly.   Check labs today.  Refill- Semaglutide, 1 MG/DOSE, 4 MG/3ML SOPN; Inject 1 mg into the skin once a week.  Dispense: 9 mL; Refill: 0  - Hemoglobin A1c  3.  Obesity, current BMI 22.1 [name] is currently in the action stage of change. As such, her goal is to continue with weight loss efforts. She has agreed to practicing portion control and making smarter food choices, such as increasing vegetables and decreasing simple carbohydrates.  RECOMMEND MAINTAINING WEIGHT.  Exercise goals:  Increase activity daily as tolerated.  Behavioral modification strategies: increasing lean protein intake, decreasing simple carbohydrates, meal planning and cooking strategies, keeping healthy foods in the home, and planning for success.  [name] has agreed to follow-up with our clinic in 8 weeks. She was informed of the importance of frequent follow-up visits to maximize her success with intensive lifestyle modifications for her multiple health conditions.   Objective:   Blood pressure 106/69, pulse 77, temperature 98.1 F (36.7 C), height '5\' 11"'$  (1.803 m), weight 158 lb (71.7 kg), SpO2 100 %. Body mass index is 22.04 kg/m.  General: Cooperative, alert, well developed, in no acute distress. HEENT: Conjunctivae and lids unremarkable. Cardiovascular: Regular rhythm.  Lungs: Normal work of breathing. Neurologic: No focal deficits.   Lab Results  Component Value Date   CREATININE 0.79 04/30/2022   BUN 16 04/30/2022   NA 141 04/30/2022   K 4.0 04/30/2022   CL 101 04/30/2022   CO2 25 04/30/2022   Lab Results  Component Value Date   ALT 19 04/30/2022   AST 16 04/30/2022   ALKPHOS 57 04/30/2022   BILITOT 0.3 04/30/2022   Lab Results  Component Value Date   HGBA1C 5.2 06/29/2022   HGBA1C 5.2 11/19/2021   HGBA1C 5.2 07/09/2021   HGBA1C 5.0 04/15/2021   HGBA1C  5.8 (H) 08/22/2020   Lab Results  Component Value Date   INSULIN 28.7 (H) 11/19/2021   INSULIN 8.6 07/09/2021   INSULIN 19.2 04/15/2021   INSULIN 7.8 08/22/2020   Lab Results  Component Value Date   TSH 1.450 04/30/2022   Lab Results  Component Value Date   CHOL 148 04/30/2022   HDL 64  04/30/2022   LDLCALC 71 04/30/2022   TRIG 64 04/30/2022   CHOLHDL 2.3 04/30/2022   Lab Results  Component Value Date   VD25OH 60.0 06/29/2022   VD25OH 58.1 10/30/2021   VD25OH 67.6 08/18/2021   Lab Results  Component Value Date   WBC 6.0 06/04/2022   HGB 12.0 06/04/2022   HCT 35.6 (L) 06/04/2022   MCV 89.4 06/04/2022   PLT 205 06/04/2022   Lab Results  Component Value Date   IRON 56 06/04/2022   TIBC 299 06/04/2022   FERRITIN 146 06/04/2022   Attestation Statements:   Reviewed by clinician on day of visit: allergies, medications, problem list, medical history, surgical history, family history, social history, and previous encounter notes.  I, Davy Pique, RMA, am acting as Location manager for Mina Marble, NP.  I have reviewed the above documentation for accuracy and completeness, and I agree with the above. -  Athanasius Kesling d. Giordano Getman, NP-C

## 2022-07-07 MED ORDER — CYCLOBENZAPRINE HCL 10 MG PO TABS
10.0000 mg | ORAL_TABLET | Freq: Every day | ORAL | 0 refills | Status: DC
  Filled 2022-07-07: qty 90, 90d supply, fill #0

## 2022-07-08 ENCOUNTER — Other Ambulatory Visit (HOSPITAL_COMMUNITY): Payer: Self-pay

## 2022-07-08 ENCOUNTER — Other Ambulatory Visit: Payer: Self-pay

## 2022-07-21 ENCOUNTER — Other Ambulatory Visit (HOSPITAL_COMMUNITY): Payer: Self-pay

## 2022-07-22 ENCOUNTER — Other Ambulatory Visit: Payer: Self-pay

## 2022-07-23 ENCOUNTER — Encounter (INDEPENDENT_AMBULATORY_CARE_PROVIDER_SITE_OTHER): Payer: Self-pay | Admitting: Adult Health

## 2022-07-23 ENCOUNTER — Other Ambulatory Visit (INDEPENDENT_AMBULATORY_CARE_PROVIDER_SITE_OTHER): Payer: Self-pay | Admitting: Adult Health

## 2022-07-23 DIAGNOSIS — E559 Vitamin D deficiency, unspecified: Secondary | ICD-10-CM

## 2022-07-23 MED ORDER — VITAMIN D (ERGOCALCIFEROL) 1.25 MG (50000 UNIT) PO CAPS
ORAL_CAPSULE | ORAL | 0 refills | Status: DC
  Filled 2022-07-23: qty 6, 84d supply, fill #0

## 2022-07-24 ENCOUNTER — Other Ambulatory Visit: Payer: Self-pay

## 2022-07-28 ENCOUNTER — Other Ambulatory Visit (HOSPITAL_COMMUNITY): Payer: Self-pay

## 2022-08-17 ENCOUNTER — Other Ambulatory Visit (HOSPITAL_COMMUNITY): Payer: Self-pay

## 2022-08-18 ENCOUNTER — Other Ambulatory Visit: Payer: Self-pay

## 2022-08-20 ENCOUNTER — Encounter: Payer: Self-pay | Admitting: Radiology

## 2022-08-21 ENCOUNTER — Other Ambulatory Visit (HOSPITAL_COMMUNITY): Payer: Self-pay

## 2022-08-24 ENCOUNTER — Encounter (INDEPENDENT_AMBULATORY_CARE_PROVIDER_SITE_OTHER): Payer: Self-pay | Admitting: Adult Health

## 2022-08-24 ENCOUNTER — Ambulatory Visit (INDEPENDENT_AMBULATORY_CARE_PROVIDER_SITE_OTHER): Payer: 59 | Admitting: Adult Health

## 2022-08-24 ENCOUNTER — Other Ambulatory Visit: Payer: Self-pay

## 2022-08-24 ENCOUNTER — Other Ambulatory Visit (HOSPITAL_COMMUNITY): Payer: Self-pay

## 2022-08-24 VITALS — BP 107/70 | HR 74 | Temp 98.1°F | Ht 71.0 in | Wt 158.0 lb

## 2022-08-24 DIAGNOSIS — E1169 Type 2 diabetes mellitus with other specified complication: Secondary | ICD-10-CM | POA: Diagnosis not present

## 2022-08-24 DIAGNOSIS — Z6822 Body mass index (BMI) 22.0-22.9, adult: Secondary | ICD-10-CM | POA: Diagnosis not present

## 2022-08-24 DIAGNOSIS — Z7985 Long-term (current) use of injectable non-insulin antidiabetic drugs: Secondary | ICD-10-CM | POA: Diagnosis not present

## 2022-08-24 DIAGNOSIS — E669 Obesity, unspecified: Secondary | ICD-10-CM | POA: Diagnosis not present

## 2022-08-24 DIAGNOSIS — E559 Vitamin D deficiency, unspecified: Secondary | ICD-10-CM | POA: Diagnosis not present

## 2022-08-24 MED ORDER — SEMAGLUTIDE (1 MG/DOSE) 4 MG/3ML ~~LOC~~ SOPN
1.0000 mg | PEN_INJECTOR | SUBCUTANEOUS | 0 refills | Status: DC
  Filled 2022-08-24: qty 9, 84d supply, fill #0
  Filled 2022-09-21: qty 3, 28d supply, fill #0
  Filled 2022-10-16: qty 3, 28d supply, fill #1
  Filled 2022-11-12: qty 3, 28d supply, fill #2

## 2022-08-24 MED ORDER — VITAMIN D (ERGOCALCIFEROL) 1.25 MG (50000 UNIT) PO CAPS
ORAL_CAPSULE | ORAL | 0 refills | Status: DC
  Filled 2022-08-24: qty 6, 84d supply, fill #0

## 2022-08-24 NOTE — Progress Notes (Signed)
Chief Complaint:   OBESITY [name] is here to discuss her progress with her obesity treatment plan along with follow-up of her obesity related diagnoses. [name] is on practicing portion control and making smarter food choices, such as increasing vegetables and decreasing simple carbohydrates and states she is following her eating plan approximately 96% of the time.  [name] states she is not currently exercising.  Today's visit was #: 20 Starting weight: 205 lbs Starting date: 08/22/2020 Today's weight: 158 Today's date: 08/24/2022 Total lbs lost to date: 47 lbs Total lbs lost since last in-office visit: 0  Interim History:  [name] has been in maintenance phase summer 2023.  She has lost 47 lbs. Current weight 158 lbs with corresponding BMI 22.1  BG well controlled on weekly Ozmepic '1mg'$ .  She plans on resuming walking outside with improved weather and longer daytime light.  Subjective:   1. Vitamin D deficiency Discussed Labs  Latest Reference Range & Units 06/29/22 09:37  Vitamin D, 25-Hydroxy 30.0 - 100.0 ng/mL 60.0  Converted from weekly to bi-weekly Ergocalciferol after labs. She reports decreased in energy that she attributes to menopause.  LMP Oct 2023  She has f/u with GYN June 2024.  2. Type 2 diabetes mellitus with other specified complication, without long-term current use of insulin ([place]) Lab Results  Component Value Date   HGBA1C 5.2 06/29/2022   HGBA1C 5.2 11/19/2021   HGBA1C 5.2 07/09/2021   Pt. believes she was dx'd with T2D on/about 2012 She is only on weekly Ozermpic '1mg'$ . Denies mass in neck, dysphagia, dyspepsia, persistent hoarseness, abdominal pain, or N/V/C  She will occasionally check fasting BG at home, always <120 She denies sx's of hypoglycemia.  Of Note- Ozempic has been approved from  12/01/2021 to 12/01/2022.   Assessment/Plan:   1. Vitamin D deficiency Continue bi weekly Ergocalciferol  Refill Ergocalciferol 50,000 IU every  14 days Disp 6 RF 0  2. Type 2 diabetes mellitus with other specified complication, without long-term current use of insulin (HCC) Refill Ozempic '1mg'$  once week Disp 9 ml RF 0  3. Obesity, current BMI 22.05  [name] is currently in the action stage of change. As such, her goal is to continue with weight loss efforts. She has agreed to practicing portion control and making smarter food choices, such as increasing vegetables and decreasing simple carbohydrates.   Exercise goals: For substantial health benefits, adults should do at least 150 minutes (2 hours and 30 minutes) a week of moderate-intensity, or 75 minutes (1 hour and 15 minutes) a week of vigorous-intensity aerobic physical activity, or an equivalent combination of moderate- and vigorous-intensity aerobic activity. Aerobic activity should be performed in episodes of at least 10 minutes, and preferably, it should be spread throughout the week.  Behavioral modification strategies: increasing lean protein intake, decreasing simple carbohydrates, increasing vegetables, meal planning and cooking strategies, keeping healthy foods in the home, and planning for success.  [name] has agreed to follow-up with our clinic in 12 weeks. She was informed of the importance of frequent follow-up visits to maximize her success with intensive lifestyle modifications for her multiple health conditions.   Objective:   Blood pressure 107/70, pulse 74, temperature 98.1 F (36.7 C), height '5\' 11"'$  (1.803 m), weight 158 lb (71.7 kg), SpO2 99 %. Body mass index is 22.04 kg/m.  General: Cooperative, alert, well developed, in no acute distress. HEENT: Conjunctivae and lids unremarkable. Cardiovascular: Regular rhythm.  Lungs: Normal work of breathing. Neurologic: No focal deficits.  Lab Results  Component Value Date   CREATININE 0.79 04/30/2022   BUN 16 04/30/2022   NA 141 04/30/2022   K 4.0 04/30/2022   CL 101 04/30/2022   CO2 25 04/30/2022   Lab  Results  Component Value Date   ALT 19 04/30/2022   AST 16 04/30/2022   ALKPHOS 57 04/30/2022   BILITOT 0.3 04/30/2022   Lab Results  Component Value Date   HGBA1C 5.2 06/29/2022   HGBA1C 5.2 11/19/2021   HGBA1C 5.2 07/09/2021   HGBA1C 5.0 04/15/2021   HGBA1C 5.8 (H) 08/22/2020   Lab Results  Component Value Date   INSULIN 28.7 (H) 11/19/2021   INSULIN 8.6 07/09/2021   INSULIN 19.2 04/15/2021   INSULIN 7.8 08/22/2020   Lab Results  Component Value Date   TSH 1.450 04/30/2022   Lab Results  Component Value Date   CHOL 148 04/30/2022   HDL 64 04/30/2022   LDLCALC 71 04/30/2022   TRIG 64 04/30/2022   CHOLHDL 2.3 04/30/2022   Lab Results  Component Value Date   VD25OH 60.0 06/29/2022   VD25OH 58.1 10/30/2021   VD25OH 67.6 08/18/2021   Lab Results  Component Value Date   WBC 6.0 06/04/2022   HGB 12.0 06/04/2022   HCT 35.6 (L) 06/04/2022   MCV 89.4 06/04/2022   PLT 205 06/04/2022   Lab Results  Component Value Date   IRON 56 06/04/2022   TIBC 299 06/04/2022   FERRITIN 146 06/04/2022   Attestation Statements:   Reviewed by clinician on day of visit: allergies, medications, problem list, medical history, surgical history, family history, social history, and previous encounter notes.  I have reviewed the above documentation for accuracy and completeness, and I agree with the above. -  Jaylia Pettus d. Aarin Sparkman, NP-C

## 2022-09-01 ENCOUNTER — Other Ambulatory Visit: Payer: Self-pay

## 2022-09-01 ENCOUNTER — Other Ambulatory Visit (HOSPITAL_COMMUNITY): Payer: Self-pay

## 2022-09-01 ENCOUNTER — Other Ambulatory Visit: Payer: Self-pay | Admitting: Family Medicine

## 2022-09-01 MED ORDER — FAMOTIDINE 40 MG PO TABS
40.0000 mg | ORAL_TABLET | Freq: Every day | ORAL | 0 refills | Status: DC
  Filled 2022-09-01: qty 90, 90d supply, fill #0

## 2022-09-21 ENCOUNTER — Other Ambulatory Visit (HOSPITAL_COMMUNITY): Payer: Self-pay

## 2022-09-21 ENCOUNTER — Other Ambulatory Visit: Payer: Self-pay

## 2022-09-21 ENCOUNTER — Encounter (HOSPITAL_COMMUNITY): Payer: Self-pay | Admitting: Hematology

## 2022-09-22 ENCOUNTER — Other Ambulatory Visit: Payer: Self-pay

## 2022-09-29 ENCOUNTER — Other Ambulatory Visit: Payer: Self-pay | Admitting: Family Medicine

## 2022-09-29 ENCOUNTER — Other Ambulatory Visit (HOSPITAL_COMMUNITY): Payer: Self-pay

## 2022-09-29 ENCOUNTER — Other Ambulatory Visit: Payer: Self-pay

## 2022-09-29 DIAGNOSIS — M549 Dorsalgia, unspecified: Secondary | ICD-10-CM

## 2022-09-29 MED ORDER — CYCLOBENZAPRINE HCL 10 MG PO TABS
10.0000 mg | ORAL_TABLET | Freq: Every day | ORAL | 0 refills | Status: DC
  Filled 2022-09-29: qty 90, 90d supply, fill #0

## 2022-09-30 ENCOUNTER — Other Ambulatory Visit: Payer: Self-pay

## 2022-10-06 ENCOUNTER — Other Ambulatory Visit (HOSPITAL_COMMUNITY): Payer: Self-pay | Admitting: Family Medicine

## 2022-10-06 ENCOUNTER — Ambulatory Visit (INDEPENDENT_AMBULATORY_CARE_PROVIDER_SITE_OTHER): Payer: 59 | Admitting: Family Medicine

## 2022-10-06 VITALS — BP 135/80 | HR 64 | Resp 16 | Ht 71.0 in | Wt 158.4 lb

## 2022-10-06 DIAGNOSIS — I10 Essential (primary) hypertension: Secondary | ICD-10-CM | POA: Diagnosis not present

## 2022-10-06 DIAGNOSIS — M549 Dorsalgia, unspecified: Secondary | ICD-10-CM | POA: Diagnosis not present

## 2022-10-06 DIAGNOSIS — E559 Vitamin D deficiency, unspecified: Secondary | ICD-10-CM | POA: Diagnosis not present

## 2022-10-06 DIAGNOSIS — R7303 Prediabetes: Secondary | ICD-10-CM

## 2022-10-06 DIAGNOSIS — M5430 Sciatica, unspecified side: Secondary | ICD-10-CM

## 2022-10-06 DIAGNOSIS — Z1231 Encounter for screening mammogram for malignant neoplasm of breast: Secondary | ICD-10-CM

## 2022-10-06 DIAGNOSIS — F320 Major depressive disorder, single episode, mild: Secondary | ICD-10-CM

## 2022-10-06 DIAGNOSIS — D500 Iron deficiency anemia secondary to blood loss (chronic): Secondary | ICD-10-CM

## 2022-10-06 DIAGNOSIS — E785 Hyperlipidemia, unspecified: Secondary | ICD-10-CM | POA: Diagnosis not present

## 2022-10-06 NOTE — Progress Notes (Signed)
[name]     MRN: [id number]      DOB: May 31, 1971   HPI Ms. Faiella is here for follow up and re-evaluation of chronic medical conditions, medication management and review of any available recent lab and radiology data.  Preventive health is updated, specifically  Cancer screening and Immunization.   Still being followed at weight loss clinic and and on semalutide , BMI now within Normal range Currently grieving the unexpected passing of her 53 y/o Dad, tearful, anxious, will consider therapy in the future if she feels she needs this Hs not taken lexapro x 4 months and has a negative depression and anxiety screen Reports good exercise routine and good control of eating habits  ROS Denies recent fever or chills. Denies sinus pressure, nasal congestion, ear pain or sore throat. Denies chest congestion, productive cough or wheezing. Denies chest pains, palpitations and leg swelling Denies abdominal pain, nausea, vomiting,diarrhea or constipation.   Denies dysuria, frequency, hesitancy or incontinence. Chronic back pain which sometimes causes  limitation in mobility. Denies headaches, seizures, numbness, or tingling. Denies skin break down or rash.   PE  BP 135/80   Pulse 64   Resp 16   Ht  (1.803 m)   Wt 158 lb 6.4 oz (71.8 kg)   SpO2 96%   BMI 22.09 kg/m   Patient alert and oriented and in no cardiopulmonary distress.  HEENT: No facial asymmetry, EOMI,     Neck supple .  Chest: Clear to auscultation bilaterally.  CVS: S1, S2 no murmurs, no S3.Regular rate.  Ext: No edema  MS: Adequate though erduced  ROM spine, normal in shoulders, hips and knees.  Skin: Intact, no ulcerations or rash noted.  Psych: Good eye contact, normal affect. Memory intact not anxious or depressed appearing.  CNS: CN 2-12 intact, power,  normal throughout.no focal deficits noted.   Assessment & Plan  Hypertension Controlled, no change in medication DASH diet and  commitment to daily physical activity for a minimum of 30 minutes discussed and encouraged, as a part of hypertension management. The importance of attaining a healthy weight is also discussed.     10/06/2022    4:16 PM 08/24/2022    8:00 AM 06/29/2022    8:00 AM 06/10/2022    8:23 AM 05/07/2022    4:11 PM 01/19/2022    8:00 AM 12/04/2021   10:35 AM  BP/Weight  Systolic BP 135 107 106 107 97 118 106  Diastolic BP 80 70 69 77 60 78 67  Wt. (Lbs) 158.4 158 158 157.7 164.04 165 170.86  BMI 22.09 kg/m2 22.04 kg/m2 22.04 kg/m2 21.99 kg/m2 22.88 kg/m2 23.01 kg/m2 24.32 kg/m2       Back pain with sciatica Controled on current meds continue same  Vitamin D deficiency Adequately corrected   Pre-diabetes Patient educated about the importance of limiting  Carbohydrate intake , the need to commit to daily physical activity for a minimum of 30 minutes , and to commit weight loss. The fact that changes in all these areas will reduce or eliminate all together the development of diabetes is stressed.      Latest Ref Rng & Units 06/29/2022    9:37 AM 04/30/2022    8:08 AM 11/19/2021    1:55 PM 10/30/2021    4:10 PM 08/13/2021    3:13 PM  Diabetic Labs  HbA1c 4.8 - 5.6 % 5.2   5.2     Chol 100 - 199 mg/dL  148   141    HDL >39 mg/dL  64   65    Calc LDL 0 - 99 mg/dL  71   62    Triglycerides 0 - 149 mg/dL  64   72    Creatinine 0.57 - 1.00 mg/dL  1.61   0.96  0.45       10/06/2022    4:16 PM 08/24/2022    8:00 AM 06/29/2022    8:00 AM 06/10/2022    8:23 AM 05/07/2022    4:11 PM 01/19/2022    8:00 AM 12/04/2021   10:35 AM  BP/Weight  Systolic BP 135 107 106 107 97 118 106  Diastolic BP 80 70 69 77 60 78 67  Wt. (Lbs) 158.4 158 158 157.7 164.04 165 170.86  BMI 22.09 kg/m2 22.04 kg/m2 22.04 kg/m2 21.99 kg/m2 22.88 kg/m2 23.01 kg/m2 24.32 kg/m2      Latest Ref Rng & Units 09/16/2021   12:00 AM 10/10/2019   12:00 AM  Foot/eye exam completion dates  Eye Exam No Retinopathy No Retinopathy     No  Retinopathy         This result is from an external source.      Depression, major, single episode, mild (HCC) Resolved , no longer on medication and has normal depression and anxiety scoredepite current grief reaction  Iron deficiency anemia due to chronic blood loss Being managed by hematology

## 2022-10-06 NOTE — Patient Instructions (Addendum)
F/U in 6 months,  call if you need me sooner  Pls get fasting lipid, cmp and EGFr and TSH 3 to 5 days before next appt with me  It is important that you exercise regularly at least 30 minutes 5 times a week. If you develop chest pain, have severe difficulty breathing, or feel very tired, stop exercising immediately and seek medical attention   Condolence re your loss, prayers, reach out to emplyee asistance for help if needed   Thanks for choosing [facility], we consider it a privelige to serve you.

## 2022-10-07 ENCOUNTER — Other Ambulatory Visit: Payer: Self-pay

## 2022-10-07 ENCOUNTER — Other Ambulatory Visit (HOSPITAL_COMMUNITY): Payer: Self-pay

## 2022-10-07 ENCOUNTER — Encounter: Payer: Self-pay | Admitting: Family Medicine

## 2022-10-07 MED ORDER — FAMOTIDINE 40 MG PO TABS
40.0000 mg | ORAL_TABLET | Freq: Every day | ORAL | 1 refills | Status: DC
  Filled 2022-10-07 – 2022-11-24 (×2): qty 90, 90d supply, fill #0
  Filled 2023-02-28: qty 90, 90d supply, fill #1

## 2022-10-07 MED ORDER — CYCLOBENZAPRINE HCL 10 MG PO TABS
10.0000 mg | ORAL_TABLET | Freq: Every day | ORAL | 2 refills | Status: DC
  Filled 2022-10-07 – 2023-01-04 (×2): qty 90, 90d supply, fill #0
  Filled 2023-03-31: qty 90, 90d supply, fill #1
  Filled 2023-06-29: qty 90, 90d supply, fill #2

## 2022-10-07 MED ORDER — ROSUVASTATIN CALCIUM 10 MG PO TABS
ORAL_TABLET | ORAL | 2 refills | Status: DC
  Filled 2022-10-07: qty 24, fill #0
  Filled 2022-10-16: qty 24, 84d supply, fill #0
  Filled 2023-01-07: qty 24, 84d supply, fill #1
  Filled 2023-03-31: qty 24, 84d supply, fill #2

## 2022-10-07 NOTE — Assessment & Plan Note (Signed)
Adequately corrected 

## 2022-10-07 NOTE — Assessment & Plan Note (Signed)
Patient educated about the importance of limiting  Carbohydrate intake , the need to commit to daily physical activity for a minimum of 30 minutes , and to commit weight loss. The fact that changes in all these areas will reduce or eliminate all together the development of diabetes is stressed.      Latest Ref Rng & Units 06/29/2022    9:37 AM 04/30/2022    8:08 AM 11/19/2021    1:55 PM 10/30/2021    4:10 PM 08/13/2021    3:13 PM  Diabetic Labs  HbA1c 4.8 - 5.6 % 5.2   5.2     Chol 100 - 199 mg/dL  161   096    HDL >04 mg/dL  64   65    Calc LDL 0 - 99 mg/dL  71   62    Triglycerides 0 - 149 mg/dL  64   72    Creatinine 0.57 - 1.00 mg/dL  5.40   9.81  1.91       10/06/2022    4:16 PM 08/24/2022    8:00 AM 06/29/2022    8:00 AM 06/10/2022    8:23 AM 05/07/2022    4:11 PM 01/19/2022    8:00 AM 12/04/2021   10:35 AM  BP/Weight  Systolic BP 135 107 106 107 97 118 106  Diastolic BP 80 70 69 77 60 78 67  Wt. (Lbs) 158.4 158 158 157.7 164.04 165 170.86  BMI 22.09 kg/m2 22.04 kg/m2 22.04 kg/m2 21.99 kg/m2 22.88 kg/m2 23.01 kg/m2 24.32 kg/m2      Latest Ref Rng & Units 09/16/2021   12:00 AM 10/10/2019   12:00 AM  Foot/eye exam completion dates  Eye Exam No Retinopathy No Retinopathy     No Retinopathy         This result is from an external source.

## 2022-10-07 NOTE — Assessment & Plan Note (Signed)
Resolved , no longer on medication and has normal depression and anxiety scoredepite current grief reaction

## 2022-10-07 NOTE — Assessment & Plan Note (Signed)
Being managed by hematology

## 2022-10-07 NOTE — Assessment & Plan Note (Signed)
Controled on current meds continue same

## 2022-10-07 NOTE — Assessment & Plan Note (Signed)
Controlled, no change in medication DASH diet and commitment to daily physical activity for a minimum of 30 minutes discussed and encouraged, as a part of hypertension management. The importance of attaining a healthy weight is also discussed.     10/06/2022    4:16 PM 08/24/2022    8:00 AM 06/29/2022    8:00 AM 06/10/2022    8:23 AM 05/07/2022    4:11 PM 01/19/2022    8:00 AM 12/04/2021   10:35 AM  BP/Weight  Systolic BP 135 107 106 107 97 118 106  Diastolic BP 80 70 69 77 60 78 67  Wt. (Lbs) 158.4 158 158 157.7 164.04 165 170.86  BMI 22.09 kg/m2 22.04 kg/m2 22.04 kg/m2 21.99 kg/m2 22.88 kg/m2 23.01 kg/m2 24.32 kg/m2

## 2022-10-16 ENCOUNTER — Other Ambulatory Visit: Payer: Self-pay

## 2022-10-16 ENCOUNTER — Other Ambulatory Visit (HOSPITAL_COMMUNITY): Payer: Self-pay

## 2022-10-30 ENCOUNTER — Other Ambulatory Visit: Payer: Self-pay | Admitting: Family Medicine

## 2022-10-30 ENCOUNTER — Other Ambulatory Visit: Payer: Self-pay

## 2022-10-30 ENCOUNTER — Other Ambulatory Visit (HOSPITAL_COMMUNITY): Payer: Self-pay

## 2022-10-30 MED ORDER — LOSARTAN POTASSIUM 25 MG PO TABS
25.0000 mg | ORAL_TABLET | Freq: Every day | ORAL | 3 refills | Status: DC
  Filled 2022-10-30: qty 30, 30d supply, fill #0
  Filled 2022-11-24: qty 30, 30d supply, fill #1
  Filled 2022-12-30: qty 30, 30d supply, fill #2
  Filled 2023-02-04: qty 30, 30d supply, fill #3

## 2022-11-12 ENCOUNTER — Other Ambulatory Visit (HOSPITAL_COMMUNITY): Payer: Self-pay

## 2022-11-17 ENCOUNTER — Other Ambulatory Visit: Payer: Self-pay

## 2022-11-17 ENCOUNTER — Other Ambulatory Visit (HOSPITAL_COMMUNITY): Payer: Self-pay

## 2022-11-17 ENCOUNTER — Ambulatory Visit (INDEPENDENT_AMBULATORY_CARE_PROVIDER_SITE_OTHER): Payer: 59 | Admitting: Adult Health

## 2022-11-17 ENCOUNTER — Encounter (INDEPENDENT_AMBULATORY_CARE_PROVIDER_SITE_OTHER): Payer: Self-pay | Admitting: Adult Health

## 2022-11-17 VITALS — BP 124/69 | HR 67 | Temp 97.7°F | Ht 71.0 in | Wt 153.0 lb

## 2022-11-17 DIAGNOSIS — E1169 Type 2 diabetes mellitus with other specified complication: Secondary | ICD-10-CM | POA: Diagnosis not present

## 2022-11-17 DIAGNOSIS — Z7985 Long-term (current) use of injectable non-insulin antidiabetic drugs: Secondary | ICD-10-CM | POA: Diagnosis not present

## 2022-11-17 DIAGNOSIS — Z6830 Body mass index (BMI) 30.0-30.9, adult: Secondary | ICD-10-CM

## 2022-11-17 DIAGNOSIS — F4321 Adjustment disorder with depressed mood: Secondary | ICD-10-CM

## 2022-11-17 DIAGNOSIS — E669 Obesity, unspecified: Secondary | ICD-10-CM

## 2022-11-17 DIAGNOSIS — Z6821 Body mass index (BMI) 21.0-21.9, adult: Secondary | ICD-10-CM

## 2022-11-17 DIAGNOSIS — E559 Vitamin D deficiency, unspecified: Secondary | ICD-10-CM | POA: Diagnosis not present

## 2022-11-17 MED ORDER — VITAMIN D (ERGOCALCIFEROL) 1.25 MG (50000 UNIT) PO CAPS
50000.0000 [IU] | ORAL_CAPSULE | ORAL | 0 refills | Status: DC
  Filled 2022-11-17: qty 6, 84d supply, fill #0

## 2022-11-17 MED ORDER — SEMAGLUTIDE (1 MG/DOSE) 4 MG/3ML ~~LOC~~ SOPN
1.0000 mg | PEN_INJECTOR | SUBCUTANEOUS | 0 refills | Status: DC
Start: 2022-11-17 — End: 2023-01-21
  Filled 2022-11-17: qty 9, 84d supply, fill #0
  Filled 2022-12-10: qty 3, 28d supply, fill #0
  Filled 2023-01-07: qty 3, 28d supply, fill #1

## 2022-11-17 NOTE — Progress Notes (Signed)
WEIGHT SUMMARY AND BIOMETRICS  Vitals Temp: 97.7 F (36.5 C) BP: 124/69 Pulse Rate: 67 SpO2: 97 %   Anthropometric Measurements Height: 5\' 11"  (1.803 m) Weight: 153 lb (69.4 kg) BMI (Calculated): 21.35 Weight at Last Visit: 158lb Weight Lost Since Last Visit: 5lb Weight Gained Since Last Visit: 0 Starting Weight: 205lb Total Weight Loss (lbs): 52 lb (23.6 kg)   Body Composition  Body Fat %: 26.8 % Fat Mass (lbs): 41 lbs Muscle Mass (lbs): 106.6 lbs Total Body Water (lbs): 70.6 lbs Visceral Fat Rating : 5   Other Clinical Data Fasting: no Labs: no Today's Visit #: 21 Starting Date: 08/22/20    Chief Complaint:   OBESITY Traeh is here to discuss her progress with her obesity treatment plan. She is on the practicing portion control and making smarter food choices, such as increasing vegetables and decreasing simple carbohydrates and states she is following her eating plan approximately 97 % of the time. She states she is exercising Walking 60 minutes 2-3 times per week.   Interim History:  Her father suffered massive CVA and passed away 10/03/22 She reports polyphagia for a few weeks after his death, has been able to resume regular/healthy eating habits the last month.  She started at [facility] 08/22/2020, starting weight 205 lbs with corresponding BMI 28.59 Today her weight is 153 lbs with corresponding BMI  21.35 She has lost 52 lbs  Current Visceral Adipose Rating 5, at goal!  Subjective:   1. Vitamin D deficiency   Latest Reference Range & Units 06/29/22 09:37  Vitamin D, 25-Hydroxy 30.0 - 100.0 ng/mL 60.0  Converted from weekly to bi-weekly Ergocalciferol after labs Jan 2024. She reports decreased in energy that she attributes to menopause.  LMP Oct 2023  She has f/u with GYN June 2024.  2. Type 2 diabetes mellitus with other specified complication, without long-term current use of insulin (HCC) Lab Results  Component Value Date   HGBA1C 5.2  06/29/2022   HGBA1C 5.2 11/19/2021   HGBA1C 5.2 07/09/2021   She reports fasting CBG 80-100s Denies sx's of hypoglycemia. She is on weekly Ozempix 1 mg injection Denies mass in neck, dysphagia, dyspepsia, persistent hoarseness, abdominal pain, or N/V/C   3. Grief Her father suffered massive CVA and passed away 10/03/2022 He was 1 and in "decent health" prior to sudden death. She reports polyphagia for a few weeks after his death, has been able to resume regular/healthy eating habits the last month. Her brother is managing her father's estate. She reports strong support system of local family/friends  Assessment/Plan:   1. Vitamin D deficiency Check Labs and Refill - Vitamin D, Ergocalciferol, (DRISDOL) 1.25 MG (50000 UNIT) CAPS capsule; Take one capsule every 14 days  Dispense: 6 capsule; Refill: 0 - VITAMIN D 25 Hydroxy (Vit-D Deficiency, Fractures)  2. Type 2 diabetes mellitus with other specified complication, without long-term current use of insulin (HCC) Check Labs and Refill - Semaglutide, 1 MG/DOSE, 4 MG/3ML SOPN; Inject 1 mg into the skin once a week.  Dispense: 9 mL; Refill: 0 - Hemoglobin A1c Check Fasting and PP levels throughout week. If Fasting <80 or ANY sx's of hypoglycemia develop- contact [facility] Pt verbalized understanding/agreement  3. Grief Continue regular exercise. Surround self with positive, supportive family/friends.  4. Obesity, current BMI 21.35  Hewan is currently in the action stage of change. As such, her goal is to continue with weight loss efforts. She has agreed to practicing portion control and making  smarter food choices, such as increasing vegetables and decreasing simple carbohydrates.   Exercise goals: For substantial health benefits, adults should do at least 150 minutes (2 hours and 30 minutes) a week of moderate-intensity, or 75 minutes (1 hour and 15 minutes) a week of vigorous-intensity aerobic physical activity, or an equivalent  combination of moderate- and vigorous-intensity aerobic activity. Aerobic activity should be performed in episodes of at least 10 minutes, and preferably, it should be spread throughout the week.  Behavioral modification strategies: increasing lean protein intake, decreasing simple carbohydrates, increasing vegetables, increasing water intake, no skipping meals, meal planning and cooking strategies, and planning for success.  Amenda has agreed to follow-up with our clinic in 14-16 weeks. She was informed of the importance of frequent follow-up visits to maximize her success with intensive lifestyle modifications for her multiple health conditions.   Layonna was informed we would discuss her lab results at her next visit unless there is a critical issue that needs to be addressed sooner. Uriyah agreed to keep her next visit at the agreed upon time to discuss these results.  Send MyChart message to pt, after lab results, Vit D and A1c  Objective:   Blood pressure 124/69, pulse 67, temperature 97.7 F (36.5 C), height 5\' 11"  (1.803 m), weight 153 lb (69.4 kg), SpO2 97 %. Body mass index is 21.34 kg/m.  General: Cooperative, alert, well developed, in no acute distress. HEENT: Conjunctivae and lids unremarkable. Cardiovascular: Regular rhythm.  Lungs: Normal work of breathing. Neurologic: No focal deficits.   Lab Results  Component Value Date   CREATININE 0.79 04/30/2022   BUN 16 04/30/2022   NA 141 04/30/2022   K 4.0 04/30/2022   CL 101 04/30/2022   CO2 25 04/30/2022   Lab Results  Component Value Date   ALT 19 04/30/2022   AST 16 04/30/2022   ALKPHOS 57 04/30/2022   BILITOT 0.3 04/30/2022   Lab Results  Component Value Date   HGBA1C 5.2 06/29/2022   HGBA1C 5.2 11/19/2021   HGBA1C 5.2 07/09/2021   HGBA1C 5.0 04/15/2021   HGBA1C 5.8 (H) 08/22/2020   Lab Results  Component Value Date   INSULIN 28.7 (H) 11/19/2021   INSULIN 8.6 07/09/2021   INSULIN 19.2 04/15/2021    INSULIN 7.8 08/22/2020   Lab Results  Component Value Date   TSH 1.450 04/30/2022   Lab Results  Component Value Date   CHOL 148 04/30/2022   HDL 64 04/30/2022   LDLCALC 71 04/30/2022   TRIG 64 04/30/2022   CHOLHDL 2.3 04/30/2022   Lab Results  Component Value Date   VD25OH 60.0 06/29/2022   VD25OH 58.1 10/30/2021   VD25OH 67.6 08/18/2021   Lab Results  Component Value Date   WBC 6.0 06/04/2022   HGB 12.0 06/04/2022   HCT 35.6 (L) 06/04/2022   MCV 89.4 06/04/2022   PLT 205 06/04/2022   Lab Results  Component Value Date   IRON 56 06/04/2022   TIBC 299 06/04/2022   FERRITIN 146 06/04/2022   Attestation Statements:   Reviewed by clinician on day of visit: allergies, medications, problem list, medical history, surgical history, family history, social history, and previous encounter notes.  I have reviewed the above documentation for accuracy and completeness, and I agree with the above. -  Naylea Wigington d. Kashish Yglesias, NP-C

## 2022-11-18 ENCOUNTER — Other Ambulatory Visit (HOSPITAL_COMMUNITY): Payer: Self-pay

## 2022-11-18 ENCOUNTER — Encounter (INDEPENDENT_AMBULATORY_CARE_PROVIDER_SITE_OTHER): Payer: Self-pay | Admitting: Adult Health

## 2022-11-18 LAB — HEMOGLOBIN A1C
Est. average glucose Bld gHb Est-mCnc: 103 mg/dL
Hgb A1c MFr Bld: 5.2 % (ref 4.8–5.6)

## 2022-11-18 LAB — VITAMIN D 25 HYDROXY (VIT D DEFICIENCY, FRACTURES): Vit D, 25-Hydroxy: 60.6 ng/mL (ref 30.0–100.0)

## 2022-11-19 ENCOUNTER — Telehealth (INDEPENDENT_AMBULATORY_CARE_PROVIDER_SITE_OTHER): Payer: Self-pay | Admitting: Adult Health

## 2022-11-19 NOTE — Telephone Encounter (Signed)
PA submitted for Ozempic, awaiting a determination.

## 2022-11-19 NOTE — Telephone Encounter (Signed)
PA for Ozempic returned the following reply: Member has an active PA on file which is expiring on 12/01/2022.

## 2022-11-24 ENCOUNTER — Other Ambulatory Visit: Payer: Self-pay

## 2022-11-24 ENCOUNTER — Other Ambulatory Visit (HOSPITAL_COMMUNITY): Payer: Self-pay

## 2022-11-25 ENCOUNTER — Encounter: Payer: Self-pay | Admitting: Family Medicine

## 2022-11-26 ENCOUNTER — Other Ambulatory Visit: Payer: Self-pay

## 2022-11-26 ENCOUNTER — Other Ambulatory Visit (HOSPITAL_COMMUNITY): Payer: Self-pay

## 2022-11-26 MED ORDER — DICLOFENAC-MISOPROSTOL 50-0.2 MG PO TBEC
1.0000 | DELAYED_RELEASE_TABLET | Freq: Two times a day (BID) | ORAL | 3 refills | Status: DC
  Filled 2022-11-26: qty 60, 30d supply, fill #0
  Filled 2023-02-04: qty 60, 30d supply, fill #1
  Filled 2023-03-03: qty 60, 30d supply, fill #2
  Filled 2023-04-20: qty 60, 30d supply, fill #3

## 2022-12-02 ENCOUNTER — Ambulatory Visit (INDEPENDENT_AMBULATORY_CARE_PROVIDER_SITE_OTHER): Payer: 59 | Admitting: Obstetrics & Gynecology

## 2022-12-02 ENCOUNTER — Other Ambulatory Visit (HOSPITAL_COMMUNITY): Payer: Self-pay

## 2022-12-02 ENCOUNTER — Other Ambulatory Visit: Payer: Self-pay

## 2022-12-02 ENCOUNTER — Encounter (HOSPITAL_BASED_OUTPATIENT_CLINIC_OR_DEPARTMENT_OTHER): Payer: Self-pay | Admitting: Obstetrics & Gynecology

## 2022-12-02 ENCOUNTER — Other Ambulatory Visit (HOSPITAL_COMMUNITY)
Admission: RE | Admit: 2022-12-02 | Discharge: 2022-12-02 | Disposition: A | Discharge status: Home / Self Care | Payer: 59 | Source: Ambulatory Visit | Attending: Obstetrics & Gynecology | Admitting: Obstetrics & Gynecology

## 2022-12-02 VITALS — BP 130/82 | HR 73 | Ht 71.0 in | Wt 156.8 lb

## 2022-12-02 DIAGNOSIS — I10 Essential (primary) hypertension: Secondary | ICD-10-CM | POA: Diagnosis not present

## 2022-12-02 DIAGNOSIS — Z124 Encounter for screening for malignant neoplasm of cervix: Secondary | ICD-10-CM | POA: Diagnosis not present

## 2022-12-02 DIAGNOSIS — D250 Submucous leiomyoma of uterus: Secondary | ICD-10-CM

## 2022-12-02 DIAGNOSIS — N898 Other specified noninflammatory disorders of vagina: Secondary | ICD-10-CM | POA: Diagnosis not present

## 2022-12-02 DIAGNOSIS — Z01419 Encounter for gynecological examination (general) (routine) without abnormal findings: Secondary | ICD-10-CM

## 2022-12-02 MED ORDER — ESTRADIOL 0.1 MG/GM VA CREA
TOPICAL_CREAM | VAGINAL | 3 refills | Status: DC
Start: 2022-12-02 — End: 2023-11-12
  Filled 2022-12-02: qty 42.5, 90d supply, fill #0

## 2022-12-02 NOTE — Progress Notes (Signed)
52 y.o. G2P0 Married [place] or [place] American female here for annual exam.  Has lost some more weight since I saw her last.  She is now #156 and has been about this weight for about six months per pt and record review.  Father passed in March.  He had a large stroke.  The family discontinued care after the family was able to all see him.    Cycles are regular.  Flow lasts 5-7 days.  Not passing clots.  Flow is much, much better.  Hb was 12 and iron levels were good.  This was done 05/2022.  HbA1C was great.   Having some vaginal dryness.     Patient's last menstrual period was 10/23/2022.          Sexually active: Yes.    The current method of family planning is tubal ligation.    Exercising: Yes.     walking Smoker:  no  Health Maintenance: Pap:  04/01/2020 Negative History of abnormal Pap:  no MMG:  12/08/2021 Negative.  Scheduled for next week.   Colonoscopy:  11/17/2019, Dr. Jena Gauss.  Follow up 10 years. BMD:   not indicated Screening Labs: done with PCP   reports that she quit smoking about 10 years ago. Her smoking use included cigarettes. She has a 3.50 pack-year smoking history. She has never used smokeless tobacco. She reports that she does not drink alcohol and does not use drugs.  Past Medical History:  Diagnosis Date   Anemia    Arthritis    Phreesia 03/29/2020   Back pain    DDD (degenerative disc disease), lumbar    Diabetes mellitus without complication (HCC)    Phreesia 03/29/2020   Essential hypertension, benign    GERD (gastroesophageal reflux disease)    Hypertension    Phreesia 03/29/2020   Joint pain    Lactose intolerance    Lumbar herniated disc    Migraine headache 2007   Obesity    Since childhood    Osteoarthritis    Pneumonia 2007   PONV (postoperative nausea and vomiting)    Patient states she woke up during tubal ligation and has had PONV in the past.   Sacroiliac joint disease    Spinal disease    Supraventricular tachycardia    Status post  RFA July 2015 - Dr. Ladona Ridgel   Type 2 diabetes mellitus (HCC)    Varicose vein of leg    Vitamin D deficiency    Weight loss    Hopitalized 4 years ago after loosing 200lb on a supervised program, malnurished with severe protein deficiency     Past Surgical History:  Procedure Laterality Date   ABLATION  12-28-2013   slow pathway modification of AVNRT by Dr Ladona Ridgel   COLONOSCOPY N/A 11/17/2019   Procedure: COLONOSCOPY;  Surgeon: Corbin Ade, MD;  Location: [facility];  Service: Endoscopy;  Laterality: N/A;  7:30   ELECTROPHYSIOLOGY STUDY N/A 12/28/2013   Procedure: ELECTROPHYSIOLOGY STUDY;  Surgeon: Marinus Maw, MD;  Location: [facility] CATH LAB;  Service: Cardiovascular;  Laterality: N/A;   HYSTEROSCOPY WITH D & C N/A 08/19/2021   Procedure: DILATATION AND CURETTAGE /HYSTEROSCOPY;  Surgeon: Jerene Bears, MD;  Location: [facility] OR;  Service: Gynecology;  Laterality: N/A;   SUPRAVENTRICULAR TACHYCARDIA ABLATION N/A 12/28/2013   Procedure: SUPRAVENTRICULAR TACHYCARDIA ABLATION;  Surgeon: Marinus Maw, MD;  Location: [facility] CATH LAB;  Service: Cardiovascular;  Laterality: N/A;   TUBAL LIGATION  1996    Current  Outpatient Medications  Medication Sig Dispense Refill   acetaminophen (TYLENOL) 500 MG tablet Take 1,000 mg by mouth in the morning and at bedtime.     cyclobenzaprine (FLEXERIL) 10 MG tablet Take 1 tablet (10 mg total) by mouth at bedtime. 90 tablet 2   Diclofenac-miSOPROStol 50-0.2 MG TBEC Take 1 tablet by mouth 2 (two) times daily after a meal. 60 tablet 3   famotidine (PEPCID) 40 MG tablet Take 1 tablet (40 mg total) by mouth daily when you take ibuprofen 90 tablet 1   ferrous sulfate 325 (65 FE) MG tablet Take 325 mg by mouth every other day.     losartan (COZAAR) 25 MG tablet Take 1 tablet (25 mg total) by mouth daily. 30 tablet 3   rosuvastatin (CRESTOR) 10 MG tablet Take 1 tablet by mouth 2 times weekly (Wed/Sun) 24 tablet 2   Semaglutide, 1 MG/DOSE, 4 MG/3ML SOPN Inject 1 mg into the  skin once a week. 9 mL 0   Vitamin D, Ergocalciferol, (DRISDOL) 1.25 MG (50000 UNIT) CAPS capsule Take 1 capsule (50,000 Units total) by mouth every 14 (fourteen) days. 6 capsule 0   No current facility-administered medications for this visit.    Family History  Problem Relation Age of Onset   Hypertension Mother    Diabetes Mother    Cancer Mother        Uterine    Heart failure Mother        CABG   Hyperlipidemia Mother    Heart disease Mother    Thyroid disease Mother    Cancer Father 87       Prostate    Hypertension Father    Bradycardia Father        Irregular heart beat    Hyperlipidemia Father    Kidney disease Father    Diabetes Brother    Diabetes Brother    Hypertension Brother    Hypertension Brother     ROS: Constitutional: negative Genitourinary: vaginal dryness  Exam:   BP 130/82 (BP Location: Left Arm, Patient Position: Sitting, Cuff Size: Normal)   Pulse 73   Ht 5\' 11"  (1.803 m) Comment: Reported  Wt 156 lb 12.8 oz (71.1 kg)   LMP 10/23/2022   BMI 21.87 kg/m   Height: 5\' 11"  (180.3 cm) (Reported)  General appearance: alert, cooperative and appears stated age Head: Normocephalic, without obvious abnormality, atraumatic Neck: no adenopathy, supple, symmetrical, trachea midline and thyroid normal to inspection and palpation Lungs: clear to auscultation bilaterally Breasts: normal appearance, no masses or tenderness Heart: regular rate and rhythm Abdomen: soft, non-tender; bowel sounds normal; no masses,  no organomegaly Extremities: extremities normal, atraumatic, no cyanosis or edema Skin: Skin color, texture, turgor normal. No rashes or lesions Lymph nodes: Cervical, supraclavicular, and axillary nodes normal. No abnormal inguinal nodes palpated Neurologic: Grossly normal   Pelvic: External genitalia:  no lesions              Urethra:  normal appearing urethra with no masses, tenderness or lesions              Bartholins and Skenes: normal                  Vagina: normal appearing vagina with normal color and no discharge, no lesions              Cervix: no lesions              Pap taken: Yes.   Bimanual  Exam:  Uterus:  normal sized, mobile, no masses              Adnexa: normal adnexa and no mass, fullness, tenderness               Rectovaginal: Confirms               Anus:  normal sphincter tone, no lesions  Chaperone, Ina Homes, CMA, was present for exam.  Assessment/Plan: 1. Well woman exam with routine gynecological exam - Pap smear obtained today - Mammogram 11/2021.  Has scheduled for next week. - Colonoscopy 2021.  Follow up 10 years. - Bone mineral density will be planned around age 67 - lab work done with PCP, Dr. Lodema Hong - vaccines reviewed/updated  2. Vaginal dryness - will try vaginal estrogen cream - estradiol (ESTRACE) 0.1 MG/GM vaginal cream; 1 gram vaginally twice weekly  Dispense: 42.5 g; Refill: 3  3. Cervical cancer screening - Cytology - PAP( [place])  4. Primary hypertension - followed by Dr. Lodema Hong  5. Fibroids, submucosal - bleeding is much improved since [place] procedure

## 2022-12-04 ENCOUNTER — Ambulatory Visit: Payer: 59 | Admitting: Orthopedic Surgery

## 2022-12-04 DIAGNOSIS — M792 Neuralgia and neuritis, unspecified: Secondary | ICD-10-CM

## 2022-12-04 LAB — CYTOLOGY - PAP
Comment: NEGATIVE
Diagnosis: NEGATIVE
High risk HPV: NEGATIVE

## 2022-12-05 ENCOUNTER — Encounter: Payer: Self-pay | Admitting: Orthopedic Surgery

## 2022-12-05 NOTE — Progress Notes (Signed)
Office Visit Note   Patient: [name]           Date of Birth: Nov 13, 1970           MRN: [id number] Visit Date: 12/04/2022 Requested by: Kerri Perches, MD [address] [place],  [place] [address] PCP: Kerri Perches, MD  Subjective: Chief Complaint  Patient presents with   Other    Shoulder/arm pain with numbness    HPI: [name] is a 52 y.o. female who presents to the office reporting right shoulder and arm pain with radiculopathy.  Denies any history of injury.  Also reports some scapular pain for the last 2 weeks.  Overall her symptoms have been going on for about 6 weeks.  Does have a history of diabetes last A1c 5.3.  Not taking any medication for her diabetes.  But she is on Ozempic for weight loss.  She feels like there are "needles in her shoulder blade".  She does have a history of having successful lumbar spine injections.  With Medrol Dosepak's she has had heart tachycardia in the past.  She does have a history of right frozen shoulder from 2 years ago. Radiographs of the cervical spine do show normal lordosis with anterior osteophyte formation at C5-6.  No real facet arthritis is present..                ROS: All systems reviewed are negative as they relate to the chief complaint within the history of present illness.  Patient denies fevers or chills.  Assessment & Plan: Visit Diagnoses:  1. Radicular pain in right arm     Plan: Impression is right-sided radiculopathy with no restriction of passive range of motion on the right shoulder.  Rotator cuff strength is intact.  This really does look more like radiculopathy with scapular involvement in her symptoms.  Plan MRI scan to evaluate right-sided radiculopathy with follow-up after that study.  Follow-Up Instructions: No follow-ups on file.   Orders:  Orders Placed This Encounter  Procedures   MR Cervical Spine w/o contrast   No orders of the defined types were placed in this  encounter.     Procedures: No procedures performed   Clinical Data: No additional findings.  Objective: Vital Signs: LMP 10/23/2022   Physical Exam:  Constitutional: Patient appears well-developed HEENT:  Head: Normocephalic Eyes:EOM are normal Neck: Normal range of motion Cardiovascular: Normal rate Pulmonary/chest: Effort normal Neurologic: Patient is alert Skin: Skin is warm Psychiatric: Patient has normal mood and affect  Ortho Exam: Ortho exam demonstrates good cervical spine range of motion with flexion extension and rotation about 60 degrees.  5 out of 5.  EPL FPL interosseous wrist flexion extension bicep triceps and deltoid strength.  Passive bilateral shoulder range of motion is 70/100/170.  Excellent rotator cuff strength to infraspinatus supraspinatus and subscap muscle testing with no coarse grinding or crepitus on the right with internal and external rotation of that arm.  No discrete AC joint tenderness is present on the right-hand side.  Specialty Comments:  No specialty comments available.  Imaging: No results found.   PMFS History: Patient Active Problem List   Diagnosis Date Noted   Hypertension 05/09/2022   Depression, major, single episode, mild (HCC) 05/09/2022   GAD (generalized anxiety disorder) 05/09/2022   Phlebitis after infusion 08/31/2021   Fibroids, submucosal 06/17/2021   Vitamin D deficiency 05/26/2021   Overweight (BMI 25.0-29.9) 05/26/2021   Shoulder pain, right 02/12/2021  Peri-menopause 01/20/2021   Pre-diabetes 11/19/2020   Iron deficiency anemia due to chronic blood loss 10/10/2020   Symptomatic varicose veins, bilateral 09/01/2018   Dyspepsia 01/18/2017   Back pain with sciatica 09/20/2014   Solitary lung nodule 09/05/2014   Hyperlipidemia with target low density lipoprotein (LDL) cholesterol less than 100 mg/dL 81/19/1478   Encounter for annual physical exam 04/18/2012   Allergic rhinitis 09/08/2010   OTH ABNORMAL BRAIN  & CNS FUNCTION STUDY 02/16/2010   GOITER, UNSPECIFIED 02/10/2010   Past Medical History:  Diagnosis Date   Anemia    Arthritis    Phreesia 03/29/2020   Back pain    DDD (degenerative disc disease), lumbar    Diabetes mellitus without complication (HCC)    Phreesia 03/29/2020   Essential hypertension, benign    GERD (gastroesophageal reflux disease)    Hypertension    Phreesia 03/29/2020   Joint pain    Lactose intolerance    Lumbar herniated disc    Migraine headache 2007   Obesity    Since childhood    Osteoarthritis    Pneumonia 2007   PONV (postoperative nausea and vomiting)    Patient states she woke up during tubal ligation and has had PONV in the past.   Sacroiliac joint disease    Spinal disease    Supraventricular tachycardia    Status post RFA July 2015 - Dr. Ladona Ridgel   Type 2 diabetes mellitus (HCC)    Varicose vein of leg    Vitamin D deficiency    Weight loss    Hopitalized 4 years ago after loosing 200lb on a supervised program, malnurished with severe protein deficiency     Family History  Problem Relation Age of Onset   Hypertension Mother    Diabetes Mother    Cancer Mother        Uterine    Heart failure Mother        CABG   Hyperlipidemia Mother    Heart disease Mother    Thyroid disease Mother    Cancer Father 81       Prostate    Hypertension Father    Bradycardia Father        Irregular heart beat    Hyperlipidemia Father    Kidney disease Father    Diabetes Brother    Diabetes Brother    Hypertension Brother    Hypertension Brother     Past Surgical History:  Procedure Laterality Date   ABLATION  12-28-2013   slow pathway modification of AVNRT by Dr Ladona Ridgel   COLONOSCOPY N/A 11/17/2019   Procedure: COLONOSCOPY;  Surgeon: Corbin Ade, MD;  Location: [facility];  Service: Endoscopy;  Laterality: N/A;  7:30   ELECTROPHYSIOLOGY STUDY N/A 12/28/2013   Procedure: ELECTROPHYSIOLOGY STUDY;  Surgeon: Marinus Maw, MD;  Location: [facility] CATH  LAB;  Service: Cardiovascular;  Laterality: N/A;   HYSTEROSCOPY WITH D & C N/A 08/19/2021   Procedure: DILATATION AND CURETTAGE /HYSTEROSCOPY;  Surgeon: Jerene Bears, MD;  Location: [facility] OR;  Service: Gynecology;  Laterality: N/A;   SUPRAVENTRICULAR TACHYCARDIA ABLATION N/A 12/28/2013   Procedure: SUPRAVENTRICULAR TACHYCARDIA ABLATION;  Surgeon: Marinus Maw, MD;  Location: [facility] CATH LAB;  Service: Cardiovascular;  Laterality: N/A;   TUBAL LIGATION  1996   Social History   Occupational History   Occupation: Garment/textile technologist at [employer]  Tobacco Use   Smoking status: Former    Packs/day: 0.50    Years: 7.00    Additional  pack years: 0.00    Total pack years: 3.50    Types: Cigarettes    Quit date: 08/05/2012    Years since quitting: 10.3   Smokeless tobacco: Never   Tobacco comments:    quit in 2014  Vaping Use   Vaping Use: Never used  Substance and Sexual Activity   Alcohol use: No    Alcohol/week: 0.0 standard drinks of alcohol   Drug use: No   Sexual activity: Yes    Birth control/protection: Surgical    Comment: tubal ligation

## 2022-12-09 ENCOUNTER — Ambulatory Visit: Payer: 59 | Admitting: Orthopedic Surgery

## 2022-12-10 ENCOUNTER — Other Ambulatory Visit: Payer: Self-pay

## 2022-12-11 ENCOUNTER — Encounter (HOSPITAL_COMMUNITY): Payer: Self-pay

## 2022-12-11 ENCOUNTER — Ambulatory Visit (HOSPITAL_COMMUNITY)
Admission: RE | Admit: 2022-12-11 | Discharge: 2022-12-11 | Disposition: A | Discharge status: Home / Self Care | Payer: 59 | Source: Ambulatory Visit | Attending: Family Medicine | Admitting: Family Medicine

## 2022-12-11 DIAGNOSIS — Z1231 Encounter for screening mammogram for malignant neoplasm of breast: Secondary | ICD-10-CM | POA: Insufficient documentation

## 2022-12-30 ENCOUNTER — Other Ambulatory Visit: Payer: Self-pay | Admitting: Oncology

## 2022-12-30 DIAGNOSIS — Z006 Encounter for examination for normal comparison and control in clinical research program: Secondary | ICD-10-CM

## 2022-12-31 ENCOUNTER — Other Ambulatory Visit (HOSPITAL_COMMUNITY): Payer: Self-pay

## 2023-01-05 ENCOUNTER — Other Ambulatory Visit: Payer: Self-pay

## 2023-01-05 ENCOUNTER — Other Ambulatory Visit (HOSPITAL_COMMUNITY)
Admission: RE | Admit: 2023-01-05 | Discharge: 2023-01-05 | Disposition: A | Discharge status: Home / Self Care | Payer: Self-pay | Source: Ambulatory Visit | Attending: Oncology | Admitting: Oncology

## 2023-01-05 DIAGNOSIS — Z006 Encounter for examination for normal comparison and control in clinical research program: Secondary | ICD-10-CM

## 2023-01-08 ENCOUNTER — Other Ambulatory Visit: Payer: Self-pay

## 2023-01-11 ENCOUNTER — Other Ambulatory Visit: Payer: Self-pay | Admitting: Oncology

## 2023-01-11 DIAGNOSIS — Z006 Encounter for examination for normal comparison and control in clinical research program: Secondary | ICD-10-CM

## 2023-01-12 ENCOUNTER — Other Ambulatory Visit (HOSPITAL_COMMUNITY)
Admission: RE | Admit: 2023-01-12 | Discharge: 2023-01-12 | Disposition: A | Discharge status: Home / Self Care | Payer: 59 | Source: Other Acute Inpatient Hospital | Attending: Oncology | Admitting: Oncology

## 2023-01-12 ENCOUNTER — Other Ambulatory Visit: Admission: RE | Admit: 2023-01-12 | Payer: 59 | Source: Other Acute Inpatient Hospital | Admitting: Oncology

## 2023-01-12 DIAGNOSIS — Z006 Encounter for examination for normal comparison and control in clinical research program: Secondary | ICD-10-CM

## 2023-01-18 ENCOUNTER — Other Ambulatory Visit: Payer: 59

## 2023-01-18 DIAGNOSIS — M47812 Spondylosis without myelopathy or radiculopathy, cervical region: Secondary | ICD-10-CM | POA: Diagnosis not present

## 2023-01-18 DIAGNOSIS — M792 Neuralgia and neuritis, unspecified: Secondary | ICD-10-CM

## 2023-01-20 ENCOUNTER — Ambulatory Visit: Payer: 59 | Admitting: Orthopedic Surgery

## 2023-01-20 DIAGNOSIS — M792 Neuralgia and neuritis, unspecified: Secondary | ICD-10-CM

## 2023-01-20 DIAGNOSIS — M7551 Bursitis of right shoulder: Secondary | ICD-10-CM

## 2023-01-21 ENCOUNTER — Other Ambulatory Visit (HOSPITAL_COMMUNITY): Payer: Self-pay

## 2023-01-21 ENCOUNTER — Ambulatory Visit (INDEPENDENT_AMBULATORY_CARE_PROVIDER_SITE_OTHER): Payer: 59 | Admitting: Adult Health

## 2023-01-21 ENCOUNTER — Other Ambulatory Visit: Payer: Self-pay

## 2023-01-21 ENCOUNTER — Encounter (INDEPENDENT_AMBULATORY_CARE_PROVIDER_SITE_OTHER): Payer: Self-pay | Admitting: Adult Health

## 2023-01-21 VITALS — BP 119/75 | HR 68 | Temp 97.9°F | Ht 71.0 in | Wt 152.0 lb

## 2023-01-21 DIAGNOSIS — E669 Obesity, unspecified: Secondary | ICD-10-CM

## 2023-01-21 DIAGNOSIS — E1169 Type 2 diabetes mellitus with other specified complication: Secondary | ICD-10-CM | POA: Diagnosis not present

## 2023-01-21 DIAGNOSIS — Z7985 Long-term (current) use of injectable non-insulin antidiabetic drugs: Secondary | ICD-10-CM | POA: Diagnosis not present

## 2023-01-21 DIAGNOSIS — E559 Vitamin D deficiency, unspecified: Secondary | ICD-10-CM | POA: Diagnosis not present

## 2023-01-21 DIAGNOSIS — I10 Essential (primary) hypertension: Secondary | ICD-10-CM

## 2023-01-21 DIAGNOSIS — Z6821 Body mass index (BMI) 21.0-21.9, adult: Secondary | ICD-10-CM | POA: Diagnosis not present

## 2023-01-21 MED ORDER — SEMAGLUTIDE (1 MG/DOSE) 4 MG/3ML ~~LOC~~ SOPN
1.0000 mg | PEN_INJECTOR | SUBCUTANEOUS | 0 refills | Status: DC
Start: 2023-01-21 — End: 2023-04-14
  Filled 2023-01-21: qty 9, 84d supply, fill #0
  Filled 2023-02-04: qty 3, 28d supply, fill #0
  Filled 2023-03-03: qty 3, 28d supply, fill #1
  Filled 2023-03-31: qty 3, 28d supply, fill #2

## 2023-01-21 MED ORDER — VITAMIN D (ERGOCALCIFEROL) 1.25 MG (50000 UNIT) PO CAPS
50000.0000 [IU] | ORAL_CAPSULE | ORAL | 0 refills | Status: DC
Start: 2023-01-21 — End: 2023-04-14
  Filled 2023-01-21 – 2023-02-04 (×2): qty 6, 84d supply, fill #0

## 2023-01-21 NOTE — Progress Notes (Signed)
WEIGHT SUMMARY AND BIOMETRICS  Vitals Temp: 97.9 F (36.6 C) BP: 119/75 Pulse Rate: 68 SpO2: 99 %   Anthropometric Measurements Height: 5\' 11"  (1.803 m) Weight: 152 lb (68.9 kg) BMI (Calculated): 21.21 Weight at Last Visit: 153lb Weight Lost Since Last Visit: 1lb Weight Gained Since Last Visit: 0 Starting Weight: 205lb Total Weight Loss (lbs): 53 lb (24 kg)   Body Composition  Body Fat %: 26.8 % Fat Mass (lbs): 40.8 lbs Muscle Mass (lbs): 105.8 lbs Total Body Water (lbs): 70.2 lbs Visceral Fat Rating : 5   Other Clinical Data Fasting: no Labs: no Today's Visit #: 22 Starting Date: 08/22/20    Chief Complaint:   OBESITY [name] is here to discuss her progress with her obesity treatment plan. She is on the practicing portion control and making smarter food choices, such as increasing vegetables and decreasing simple carbohydrates and states she is following her eating plan approximately 97 % of the time. 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   Interim History:  She started at [facility] 08/22/2020, starting weight 205 lbs with corresponding BMI 28.59 Today her weight is 152 lbs with corresponding BMI  21.21 She has lost 53 lbs  Current Visceral Adipose Rating 5, at goal!  She is in Mx Phase- followed by [facility] Q10-12 weeks  Happy [place] Ouch!  Subjective:   1. Vitamin D deficiency Discussed Labs  Latest Reference Range & Units 06/29/22 09:37 11/17/22 09:23  Vitamin D, 25-Hydroxy 30.0 - 100.0 ng/mL 60.0 60.6  She is on Ergocalciferol- every 14 days She denies N/V/Muscle Weakness  2. Type 2 diabetes mellitus with other specified complication, without long-term current use of insulin (HCC) Lab Results  Component Value Date   HGBA1C 5.2 11/17/2022   HGBA1C 5.2 06/29/2022   HGBA1C 5.2 11/19/2021   She infrequently checks home fasting CBG. When she does check, levels <110 She is on weekly Ozempic 1mg  Denies mass in neck, dysphagia, dyspepsia,  persistent hoarseness, abdominal pain, or N/V/C   3. Primary hypertension BP at goal She denies CP with exertion She is on daily Losartan 25mg    Assessment/Plan:   1. Vitamin D deficiency Refill - Vitamin D, Ergocalciferol, (DRISDOL) 1.25 MG (50000 UNIT) CAPS capsule; Take 1 capsule (50,000 Units total) by mouth every 14 (fourteen) days.  Dispense: 6 capsule; Refill: 0  2. Type 2 diabetes mellitus with other specified complication, without long-term current use of insulin (HCC) Refill - Semaglutide, 1 MG/DOSE, 4 MG/3ML SOPN; Inject 1 mg into the skin once a week.  Dispense: 9 mL; Refill: 0  3. Primary hypertension Maintain weight Continue healthy eating and regular exercise  4. Obesity, current BMI 21.21  [name] is currently in the action stage of change. As such, her goal is to maintain weight for now. She has agreed to practicing portion control and making smarter food choices, such as increasing vegetables and decreasing simple carbohydrates.   Exercise goals: For substantial health benefits, adults should do at least 150 minutes (2 hours and 30 minutes) a week of moderate-intensity, or 75 minutes (1 hour and 15 minutes) a week of vigorous-intensity aerobic physical activity, or an equivalent combination of moderate- and vigorous-intensity aerobic activity. Aerobic activity should be performed in episodes of at least 10 minutes, and preferably, it should be spread throughout the week.  Behavioral modification strategies: increasing lean protein intake, decreasing simple carbohydrates, increasing vegetables, increasing water intake, keeping healthy foods in the home, ways to avoid boredom eating, and  planning for success.  Nyelah has agreed to follow-up with our clinic in 12 weeks. She was informed of the importance of frequent follow-up visits to maximize her success with intensive lifestyle modifications for her multiple health conditions.   Check Fasting Labs at next  OV  Objective:   Blood pressure 119/75, pulse 68, temperature 97.9 F (36.6 C), height 5\' 11"  (1.803 m), weight 152 lb (68.9 kg), SpO2 99%. Body mass index is 21.2 kg/m.  General: Cooperative, alert, well developed, in no acute distress. HEENT: Conjunctivae and lids unremarkable. Cardiovascular: Regular rhythm.  Lungs: Normal work of breathing. Neurologic: No focal deficits.   Lab Results  Component Value Date   CREATININE 0.79 04/30/2022   BUN 16 04/30/2022   NA 141 04/30/2022   K 4.0 04/30/2022   CL 101 04/30/2022   CO2 25 04/30/2022   Lab Results  Component Value Date   ALT 19 04/30/2022   AST 16 04/30/2022   ALKPHOS 57 04/30/2022   BILITOT 0.3 04/30/2022   Lab Results  Component Value Date   HGBA1C 5.2 11/17/2022   HGBA1C 5.2 06/29/2022   HGBA1C 5.2 11/19/2021   HGBA1C 5.2 07/09/2021   HGBA1C 5.0 04/15/2021   Lab Results  Component Value Date   INSULIN 28.7 (H) 11/19/2021   INSULIN 8.6 07/09/2021   INSULIN 19.2 04/15/2021   INSULIN 7.8 08/22/2020   Lab Results  Component Value Date   TSH 1.450 04/30/2022   Lab Results  Component Value Date   CHOL 148 04/30/2022   HDL 64 04/30/2022   LDLCALC 71 04/30/2022   TRIG 64 04/30/2022   CHOLHDL 2.3 04/30/2022   Lab Results  Component Value Date   VD25OH 60.6 11/17/2022   VD25OH 60.0 06/29/2022   VD25OH 58.1 10/30/2021   Lab Results  Component Value Date   WBC 6.0 06/04/2022   HGB 12.0 06/04/2022   HCT 35.6 (L) 06/04/2022   MCV 89.4 06/04/2022   PLT 205 06/04/2022   Lab Results  Component Value Date   IRON 56 06/04/2022   TIBC 299 06/04/2022   FERRITIN 146 06/04/2022    Attestation Statements:   Reviewed by clinician on day of visit: allergies, medications, problem list, medical history, surgical history, family history, social history, and previous encounter notes.  I have reviewed the above documentation for accuracy and completeness, and I agree with the above. -  Nautika Cressey d. Anijah Spohr, NP-C

## 2023-01-22 ENCOUNTER — Encounter: Payer: Self-pay | Admitting: Orthopedic Surgery

## 2023-01-22 DIAGNOSIS — M7551 Bursitis of right shoulder: Secondary | ICD-10-CM

## 2023-01-22 DIAGNOSIS — M792 Neuralgia and neuritis, unspecified: Secondary | ICD-10-CM | POA: Diagnosis not present

## 2023-01-22 MED ORDER — BUPIVACAINE HCL 0.5 % IJ SOLN
9.0000 mL | INTRAMUSCULAR | Status: AC | PRN
Start: 2023-01-22 — End: 2023-01-22
  Administered 2023-01-22: 9 mL via INTRA_ARTICULAR

## 2023-01-22 MED ORDER — LIDOCAINE HCL 1 % IJ SOLN
5.0000 mL | INTRAMUSCULAR | Status: AC | PRN
Start: 2023-01-22 — End: 2023-01-22
  Administered 2023-01-22: 5 mL

## 2023-01-22 MED ORDER — METHYLPREDNISOLONE ACETATE 40 MG/ML IJ SUSP
40.0000 mg | INTRAMUSCULAR | Status: AC | PRN
Start: 2023-01-22 — End: 2023-01-22
  Administered 2023-01-22: 40 mg via INTRA_ARTICULAR

## 2023-01-22 NOTE — Progress Notes (Signed)
Office Visit Note   Patient: [name]           Date of Birth: 1970/10/03           MRN: [id number] Visit Date: 01/20/2023 Requested by: Kerri Perches, MD [address] [place],  [place] [address] PCP: Kerri Perches, MD  Subjective: Chief Complaint  Patient presents with   Other     Scan review    HPI: [name] is a 52 y.o. female who presents to the office reporting neck and right shoulder pain.  Since she was last seen she had an MRI scan on her cervical spine.  On my review of that imaging study which is not officially read yet there is nothing really compelling in terms of disc herniation or protrusion particularly on that right-hand side.  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  Using diclofenac and Flexeril.  Most of her pain is posterior in the shoulder..                ROS: All systems reviewed are negative as they relate to the chief complaint within the history of present illness.  Patient denies fevers or chills.  Assessment & Plan: Visit Diagnoses:  1. Radicular pain in right arm     Plan: Impression is possible bursitis in the shoulder.  The neck MRI is not really compelling for a radicular cause of her symptoms.  We will try subacromial injection today and see how that works.  Note provided for office work only for 1 week following this injection.  Follow-up as needed for further workup.  Follow-Up Instructions: No follow-ups on file.   Orders:  No orders of the defined types were placed in this encounter.  No orders of the defined types were placed in this encounter.     Procedures: Large Joint Inj: R subacromial bursa on 01/22/2023 11:34 PM Indications: diagnostic evaluation and pain Details: 18 G 1.5 in needle, posterior approach  Arthrogram: No  Medications: 9 mL bupivacaine 0.5 %; 40 mg methylPREDNISolone acetate 40 MG/ML; 5 mL lidocaine 1 % Outcome: tolerated well, no immediate  complications Procedure, treatment alternatives, risks and benefits explained, specific risks discussed. Consent was given by the patient. Immediately prior to procedure a time out was called to verify the correct patient, procedure, equipment, support staff and site/side marked as required. Patient was prepped and draped in the usual sterile fashion.       Clinical Data: No additional findings.  Objective: Vital Signs: There were no vitals taken for this visit.  Physical Exam:  Constitutional: Patient appears well-developed HEENT:  Head: Normocephalic Eyes:EOM are normal Neck: Normal range of motion Cardiovascular: Normal rate Pulmonary/chest: Effort normal Neurologic: Patient is alert Skin: Skin is warm Psychiatric: Patient has normal mood and affect  Ortho Exam: Ortho exam demonstrates range of motion on the right of 75/100/165.  And signs equivocal on the right negative on the left.  No discrete AC joint tenderness is present.  No masses lymphadenopathy or skin changes noted in that shoulder girdle region.  Neck range of motion otherwise intact.  No coarse grinding or crepitus with internal/external rotation of that right shoulder.  O'Brien's testing equivocal on the right negative on the left.  Specialty Comments:  No specialty comments available.  Imaging: No results found.   PMFS History: Patient Active Problem List   Diagnosis Date Noted   Hypertension 05/09/2022   Depression, major,  single episode, mild (HCC) 05/09/2022   GAD (generalized anxiety disorder) 05/09/2022   Phlebitis after infusion 08/31/2021   Fibroids, submucosal 06/17/2021   Vitamin D deficiency 05/26/2021   Overweight (BMI 25.0-29.9) 05/26/2021   Shoulder pain, right 02/12/2021   Peri-menopause 01/20/2021   Pre-diabetes 11/19/2020   Iron deficiency anemia due to chronic blood loss 10/10/2020   Symptomatic varicose veins, bilateral 09/01/2018   Dyspepsia 01/18/2017   Back pain with sciatica  09/20/2014   Solitary lung nodule 09/05/2014   Hyperlipidemia with target low density lipoprotein (LDL) cholesterol less than 100 mg/dL 16/03/9603   Encounter for annual physical exam 04/18/2012   Allergic rhinitis 09/08/2010   OTH ABNORMAL BRAIN & CNS FUNCTION STUDY 02/16/2010   GOITER, UNSPECIFIED 02/10/2010   Past Medical History:  Diagnosis Date   Anemia    Arthritis    Phreesia 03/29/2020   Back pain    DDD (degenerative disc disease), lumbar    Diabetes mellitus without complication (HCC)    Phreesia 03/29/2020   Essential hypertension, benign    GERD (gastroesophageal reflux disease)    Hypertension    Phreesia 03/29/2020   Joint pain    Lactose intolerance    Lumbar herniated disc    Migraine headache 2007   Obesity    Since childhood    Osteoarthritis    Pneumonia 2007   PONV (postoperative nausea and vomiting)    Patient states she woke up during tubal ligation and has had PONV in the past.   Sacroiliac joint disease    Spinal disease    Supraventricular tachycardia    Status post RFA July 2015 - Dr. Ladona Ridgel   Type 2 diabetes mellitus (HCC)    Varicose vein of leg    Vitamin D deficiency    Weight loss    Hopitalized 4 years ago after loosing 200lb on a supervised program, malnurished with severe protein deficiency     Family History  Problem Relation Age of Onset   Hypertension Mother    Diabetes Mother    Cancer Mother        Uterine    Heart failure Mother        CABG   Hyperlipidemia Mother    Heart disease Mother    Thyroid disease Mother    Cancer Father 30       Prostate    Hypertension Father    Bradycardia Father        Irregular heart beat    Hyperlipidemia Father    Kidney disease Father    Diabetes Brother    Diabetes Brother    Hypertension Brother    Hypertension Brother     Past Surgical History:  Procedure Laterality Date   ABLATION  12/28/2013   slow pathway modification of AVNRT by Dr Ladona Ridgel   BREAST BIOPSY Right  10/01/2015   BENIGN ADIPOSE TISSUE   COLONOSCOPY N/A 11/17/2019   Procedure: COLONOSCOPY;  Surgeon: Corbin Ade, MD;  Location: [facility];  Service: Endoscopy;  Laterality: N/A;  7:30   ELECTROPHYSIOLOGY STUDY N/A 12/28/2013   Procedure: ELECTROPHYSIOLOGY STUDY;  Surgeon: Marinus Maw, MD;  Location: [facility] CATH LAB;  Service: Cardiovascular;  Laterality: N/A;   HYSTEROSCOPY WITH D & C N/A 08/19/2021   Procedure: DILATATION AND CURETTAGE /HYSTEROSCOPY;  Surgeon: Jerene Bears, MD;  Location: [facility] OR;  Service: Gynecology;  Laterality: N/A;   SUPRAVENTRICULAR TACHYCARDIA ABLATION N/A 12/28/2013   Procedure: SUPRAVENTRICULAR TACHYCARDIA ABLATION;  Surgeon: Marinus Maw, MD;  Location: [facility] CATH LAB;  Service: Cardiovascular;  Laterality: N/A;   TUBAL LIGATION  06/22/1994   Social History   Occupational History   Occupation: Garment/textile technologist at [employer]  Tobacco Use   Smoking status: Former    Current packs/day: 0.00    Average packs/day: 0.5 packs/day for 7.0 years (3.5 ttl pk-yrs)    Types: Cigarettes    Start date: 08/05/2005    Quit date: 08/05/2012    Years since quitting: 10.4   Smokeless tobacco: Never   Tobacco comments:    quit in 2014  Vaping Use   Vaping status: Never Used  Substance and Sexual Activity   Alcohol use: No    Alcohol/week: 0.0 standard drinks of alcohol   Drug use: No   Sexual activity: Yes    Birth control/protection: Surgical    Comment: tubal ligation

## 2023-01-27 ENCOUNTER — Ambulatory Visit (INDEPENDENT_AMBULATORY_CARE_PROVIDER_SITE_OTHER): Payer: 59 | Admitting: Adult Health

## 2023-02-04 ENCOUNTER — Other Ambulatory Visit (HOSPITAL_COMMUNITY): Payer: Self-pay

## 2023-02-04 ENCOUNTER — Other Ambulatory Visit: Payer: Self-pay

## 2023-02-28 ENCOUNTER — Other Ambulatory Visit: Payer: Self-pay | Admitting: Family Medicine

## 2023-02-28 ENCOUNTER — Other Ambulatory Visit (HOSPITAL_COMMUNITY): Payer: Self-pay

## 2023-02-28 IMAGING — US US EXTREM  UP VENOUS*L*
1 series · 13 of 24 positions shown · non-contrast
Comparison: None.

CLINICAL DATA: Left hand pain and swelling. This has worsened over
the last 2 days. This occurred after having a left hand IV.



[Series 1: us venous img upper uni left (dvt) · portal-venous · 13 of 46 slices shown]
[im 1/46]
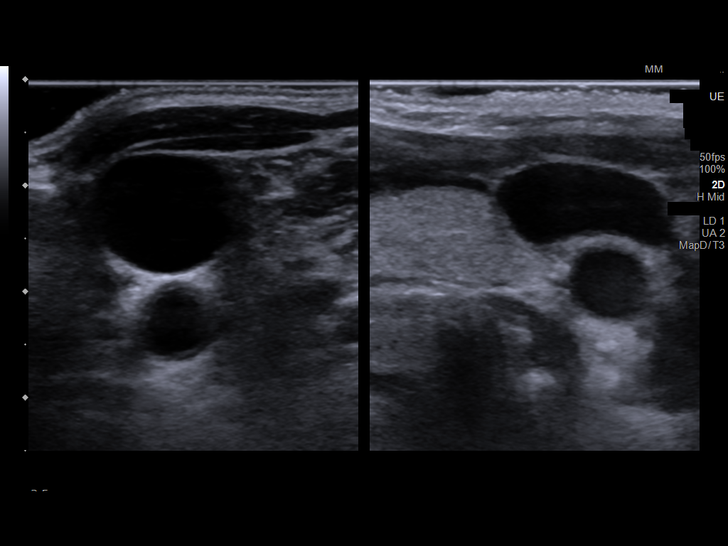
[im 4/46]
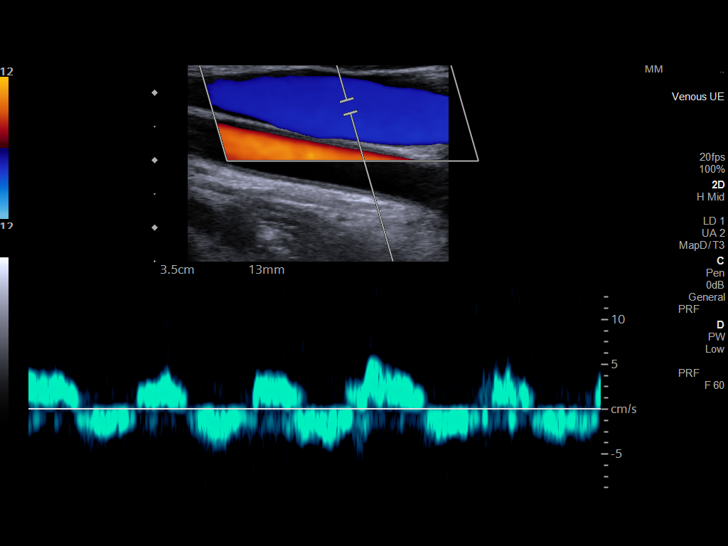
[im 8/46]
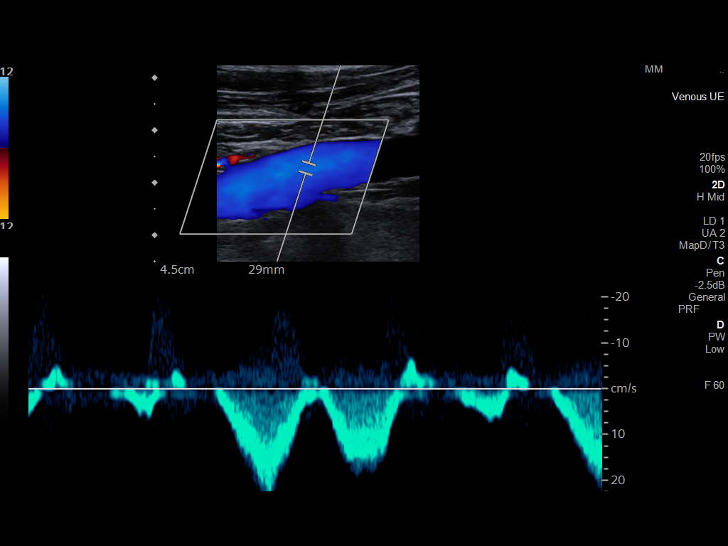
[im 12/46]
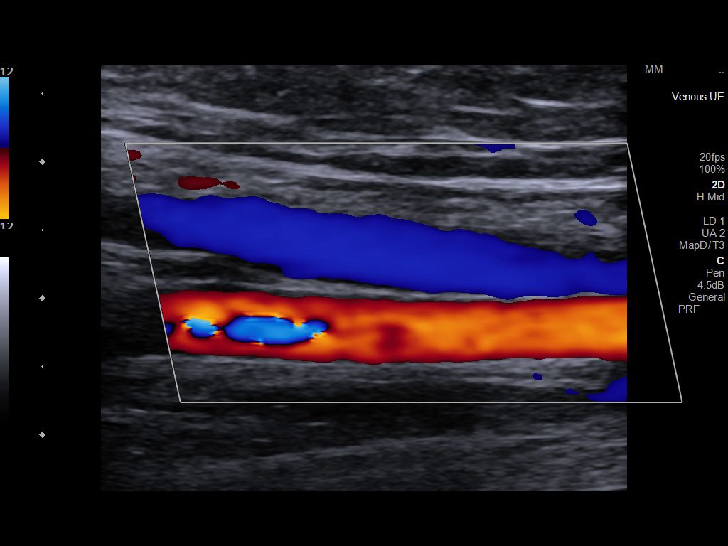
[im 16/46]
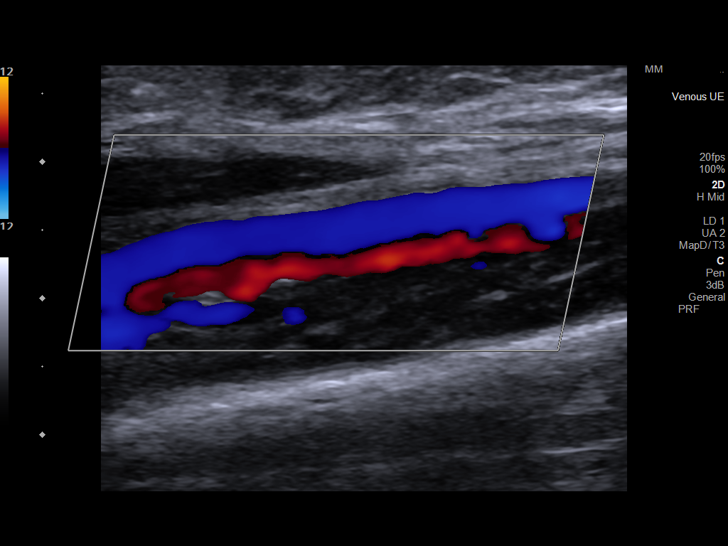
[im 20/46]
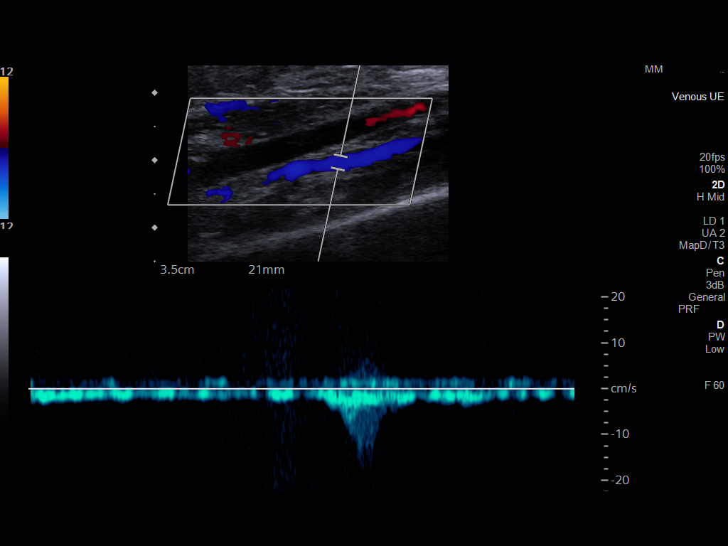
[im 24/46]
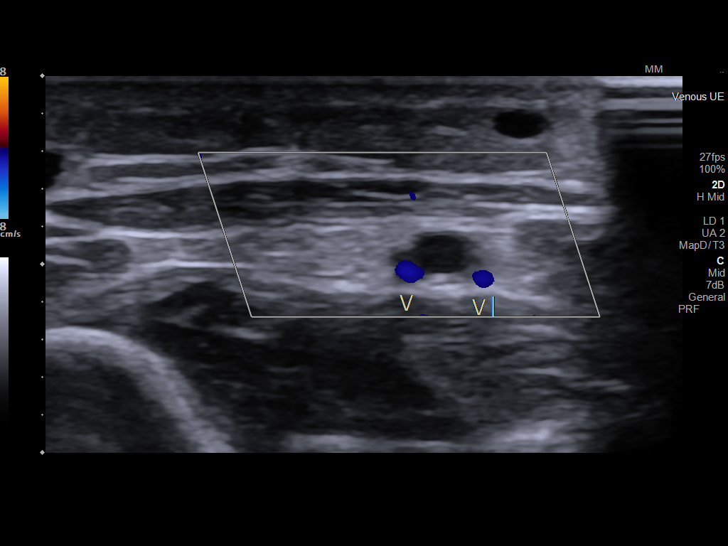
[im 26/46]
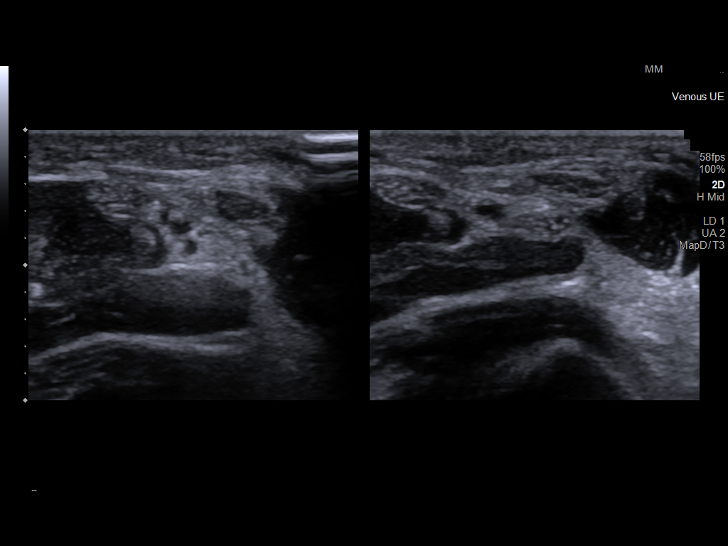
[im 30/46]
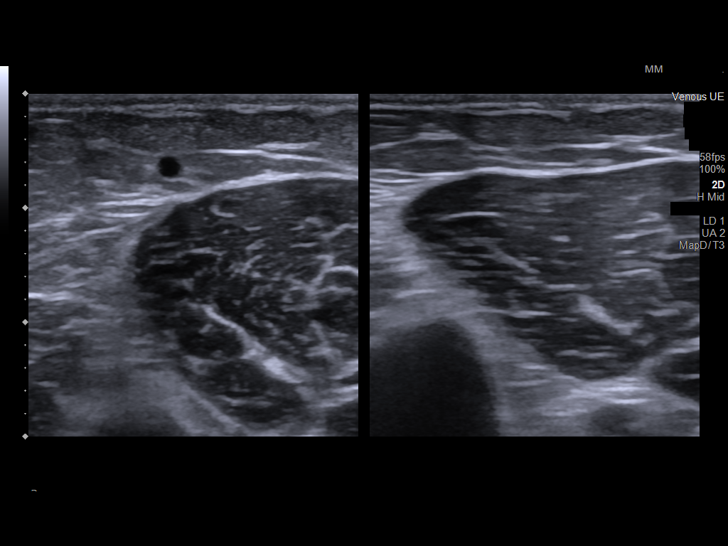
[im 34/46]
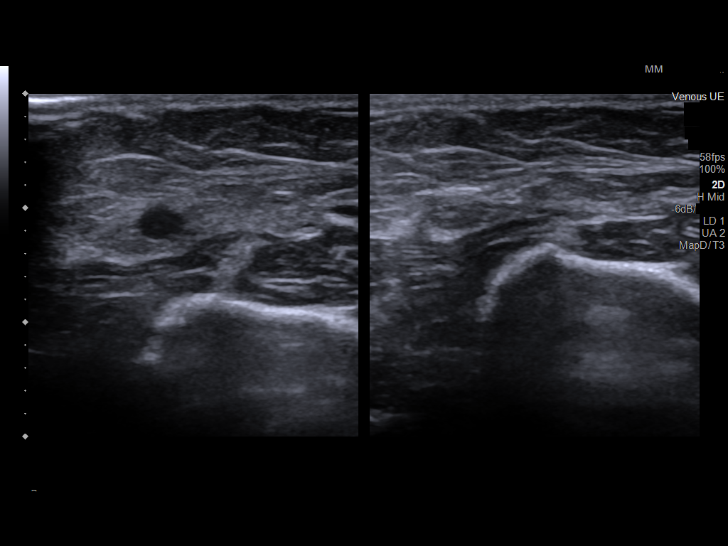
[im 38/46]
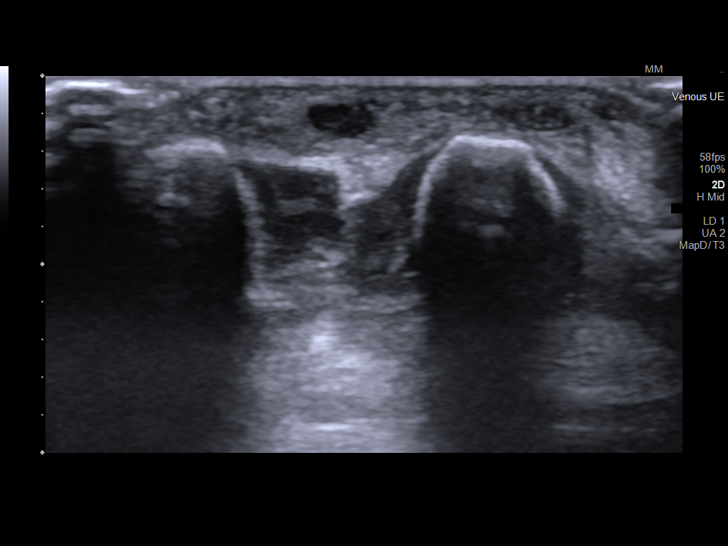
[im 42/46]
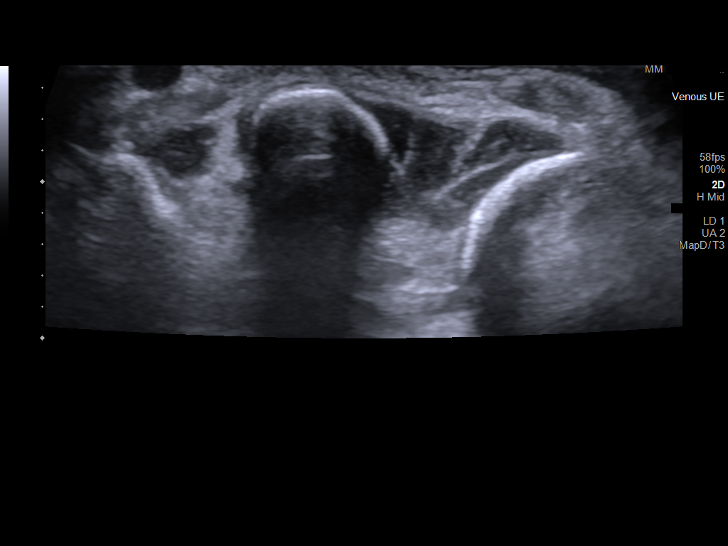
[im 46/46]
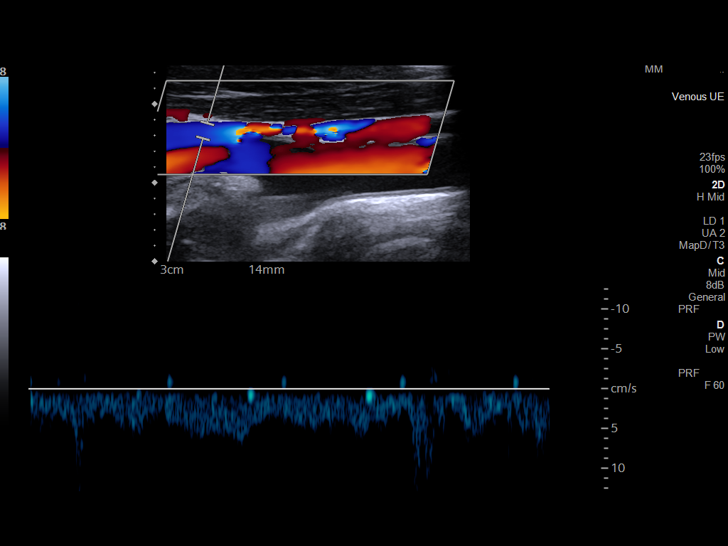

[13 of 24 positions shown; findings below may reference images not displayed]

FINDINGS: Contralateral Subclavian Vein: Respiratory phasicity is normal and
symmetric with the symptomatic side. No evidence of thrombus. Normal
compressibility.

Internal Jugular Vein: No evidence of thrombus. Normal
compressibility, respiratory phasicity and response to augmentation.

Subclavian Vein: No evidence of thrombus. Normal compressibility,
respiratory phasicity and response to augmentation.

Axillary Vein: No evidence of thrombus. Normal compressibility,
respiratory phasicity and response to augmentation.

Cephalic Vein: No evidence of thrombus. Normal compressibility,
respiratory phasicity and response to augmentation.

Basilic Vein: No evidence of thrombus. Normal compressibility,
respiratory phasicity and response to augmentation.

Brachial Veins: No evidence of thrombus. Normal compressibility,
respiratory phasicity and response to augmentation.

Radial Veins: No evidence of thrombus. Normal compressibility,
respiratory phasicity and response to augmentation.

Ulnar Veins: No evidence of thrombus. Normal compressibility,
respiratory phasicity and response to augmentation.

Venous Reflux:  None visualized.

Other Findings: In the dorsal left hand, there is a superficial vein
containing echogenic material, noncompressible, consistent with
thrombosis. This corresponds to the area of the previously left hand
IV.
IMPRESSION: No evidence of DVT within the left upper extremity.

Superficial thrombus/thrombophlebitis in the dorsal hand at the site
of the prior IV.

## 2023-03-01 ENCOUNTER — Other Ambulatory Visit: Payer: Self-pay

## 2023-03-01 MED ORDER — LOSARTAN POTASSIUM 25 MG PO TABS
25.0000 mg | ORAL_TABLET | Freq: Every day | ORAL | 3 refills | Status: DC
  Filled 2023-03-01: qty 30, 30d supply, fill #0
  Filled 2023-04-20: qty 30, 30d supply, fill #1
  Filled 2023-05-18: qty 30, 30d supply, fill #2
  Filled 2023-06-20: qty 30, 30d supply, fill #3

## 2023-03-03 ENCOUNTER — Other Ambulatory Visit: Payer: Self-pay

## 2023-03-03 ENCOUNTER — Other Ambulatory Visit (HOSPITAL_COMMUNITY): Payer: Self-pay

## 2023-03-04 ENCOUNTER — Other Ambulatory Visit: Payer: Self-pay

## 2023-03-31 ENCOUNTER — Other Ambulatory Visit: Payer: Self-pay

## 2023-03-31 DIAGNOSIS — E785 Hyperlipidemia, unspecified: Secondary | ICD-10-CM | POA: Diagnosis not present

## 2023-03-31 DIAGNOSIS — I10 Essential (primary) hypertension: Secondary | ICD-10-CM | POA: Diagnosis not present

## 2023-04-01 LAB — CMP14+EGFR
ALT: 21 [IU]/L (ref 0–32)
AST: 22 [IU]/L (ref 0–40)
Albumin: 4.7 g/dL (ref 3.8–4.9)
Alkaline Phosphatase: 63 [IU]/L (ref 44–121)
BUN/Creatinine Ratio: 21 (ref 9–23)
BUN: 16 mg/dL (ref 6–24)
Bilirubin Total: 0.3 mg/dL (ref 0.0–1.2)
CO2: 24 mmol/L (ref 20–29)
Calcium: 9.4 mg/dL (ref 8.7–10.2)
Chloride: 103 mmol/L (ref 96–106)
Creatinine, Ser: 0.77 mg/dL (ref 0.57–1.00)
Globulin, Total: 2.8 g/dL (ref 1.5–4.5)
Glucose: 70 mg/dL (ref 70–99)
Potassium: 4.3 mmol/L (ref 3.5–5.2)
Sodium: 138 mmol/L (ref 134–144)
Total Protein: 7.5 g/dL (ref 6.0–8.5)
eGFR: 92 mL/min/{1.73_m2} (ref >59)

## 2023-04-01 LAB — LIPID PANEL
Chol/HDL Ratio: 2 {ratio} (ref 0.0–4.4)
Cholesterol, Total: 144 mg/dL (ref 100–199)
HDL: 72 mg/dL (ref >39)
LDL Chol Calc (NIH): 61 mg/dL (ref 0–99)
Triglycerides: 52 mg/dL (ref 0–149)
VLDL Cholesterol Cal: 11 mg/dL (ref 5–40)

## 2023-04-01 LAB — TSH: TSH: 1.21 u[IU]/mL (ref 0.450–4.500)

## 2023-04-07 ENCOUNTER — Ambulatory Visit (INDEPENDENT_AMBULATORY_CARE_PROVIDER_SITE_OTHER): Payer: 59 | Admitting: Family Medicine

## 2023-04-07 VITALS — BP 120/82 | HR 74 | Ht 71.0 in | Wt 158.0 lb

## 2023-04-07 DIAGNOSIS — Z0001 Encounter for general adult medical examination with abnormal findings: Secondary | ICD-10-CM | POA: Insufficient documentation

## 2023-04-07 DIAGNOSIS — Z0000 Encounter for general adult medical examination without abnormal findings: Secondary | ICD-10-CM

## 2023-04-07 NOTE — Patient Instructions (Addendum)
F/U in 6 months, call if you need me sooner  Fasting lipid, cm,p and EGfr and Vit D 1 week before follow up, no med changes  Excellent labs  Nurse will order cologuard for clon cancer screening  Discuss lowering semagltide at visit with weight management  It is important that you exercise regularly at least 30 minutes 5 times a week. If you develop chest pain, have severe difficulty breathing, or feel very tired, stop exercising immediately and seek medical attention   Thanks for choosing [facility], we consider it a privelige to serve you.

## 2023-04-07 NOTE — Assessment & Plan Note (Signed)
Annual exam as documented. Counseling done  re healthy lifestyle involving commitment to 150 minutes exercise per week, heart healthy diet, and attaining healthy weight.The importance of adequate sleep also discussed.  Immunization and cancer screening needs are specifically addressed at this visit.  

## 2023-04-07 NOTE — Progress Notes (Signed)
[name]     MRN: [id number]      DOB: 16-Dec-1970  Chief Complaint  Patient presents with   Annual Exam    HPI: Patient is in for annual physical exam. No other health concerns are expressed or addressed at the visit. Recent labs,  are reviewed. Immunization is reviewed , and  updated if needed.   PE: BP 120/82   Pulse 74   Ht 5\' 11"  (1.803 m)   Wt 158 lb 0.6 oz (71.7 kg)   SpO2 96%   BMI 22.04 kg/m    Pleasant  female, alert and oriented x 3, in no cardio-pulmonary distress. Afebrile. HEENT No facial trauma or asymetry. Sinuses non tender.  Extra occullar muscles intact.. External ears normal, . Neck: supple, no adenopathy,JVD or thyromegaly.No bruits.  Chest: Clear to ascultation bilaterally.No crackles or wheezes. Non tender to palpation  Breast: Asymptomatic, not examined,  mammogram is up to date  Cardiovascular system; Heart sounds normal,  S1 and  S2 ,no S3.  No murmur, or thrill. Apical beat not displaced Peripheral pulses normal.  Abdomen: Soft, non tender  Musculoskeletal exam: Full ROM of spine, hips , shoulders and knees. No deformity ,swelling or crepitus noted. No muscle wasting or atrophy.   Neurologic: Cranial nerves 2 to 12 intact. Power, tone ,s normal throughout. No disturbance in gait. No tremor.  Skin: Intact, no ulceration, erythema , scaling or rash noted. Pigmentation normal throughout  Psych; Normal mood and affect. Judgement and concentration normal   Assessment & Plan:  Encounter for annual physical exam Annual exam as documented. Counseling done  re healthy lifestyle involving commitment to 150 minutes exercise per week, heart healthy diet, and attaining healthy weight.The importance of adequate sleep also discussed. . Immunization and cancer screening needs are specifically addressed at this visit.

## 2023-04-14 ENCOUNTER — Ambulatory Visit (INDEPENDENT_AMBULATORY_CARE_PROVIDER_SITE_OTHER): Payer: 59 | Admitting: Adult Health

## 2023-04-14 ENCOUNTER — Encounter (INDEPENDENT_AMBULATORY_CARE_PROVIDER_SITE_OTHER): Payer: Self-pay | Admitting: Adult Health

## 2023-04-14 ENCOUNTER — Other Ambulatory Visit (HOSPITAL_COMMUNITY): Payer: Self-pay

## 2023-04-14 ENCOUNTER — Other Ambulatory Visit: Payer: Self-pay

## 2023-04-14 VITALS — BP 120/67 | HR 85 | Temp 97.5°F | Ht 71.0 in | Wt 153.0 lb

## 2023-04-14 DIAGNOSIS — E559 Vitamin D deficiency, unspecified: Secondary | ICD-10-CM | POA: Diagnosis not present

## 2023-04-14 DIAGNOSIS — Z6821 Body mass index (BMI) 21.0-21.9, adult: Secondary | ICD-10-CM

## 2023-04-14 DIAGNOSIS — E1169 Type 2 diabetes mellitus with other specified complication: Secondary | ICD-10-CM

## 2023-04-14 DIAGNOSIS — Z7985 Long-term (current) use of injectable non-insulin antidiabetic drugs: Secondary | ICD-10-CM | POA: Diagnosis not present

## 2023-04-14 DIAGNOSIS — E669 Obesity, unspecified: Secondary | ICD-10-CM | POA: Diagnosis not present

## 2023-04-14 DIAGNOSIS — E66811 Obesity, class 1: Secondary | ICD-10-CM

## 2023-04-14 DIAGNOSIS — E785 Hyperlipidemia, unspecified: Secondary | ICD-10-CM

## 2023-04-14 MED ORDER — VITAMIN D (ERGOCALCIFEROL) 1.25 MG (50000 UNIT) PO CAPS
50000.0000 [IU] | ORAL_CAPSULE | ORAL | 0 refills | Status: DC
  Filled 2023-04-14 – 2023-05-04 (×2): qty 6, 84d supply, fill #0

## 2023-04-14 MED ORDER — SEMAGLUTIDE (1 MG/DOSE) 4 MG/3ML ~~LOC~~ SOPN
1.0000 mg | PEN_INJECTOR | SUBCUTANEOUS | 0 refills | Status: DC
  Filled 2023-04-14: qty 18, 168d supply, fill #0
  Filled 2023-04-24: qty 9, 84d supply, fill #0

## 2023-04-14 NOTE — Progress Notes (Signed)
WEIGHT SUMMARY AND BIOMETRICS  Vitals Temp: (!) 97.5 F (36.4 C) BP: 120/67 Pulse Rate: 85 SpO2: 98 %   Anthropometric Measurements Height: 5\' 11"  (1.803 m) Weight: 153 lb (69.4 kg) BMI (Calculated): 21.35 Weight at Last Visit: 152lb Weight Lost Since Last Visit: 0 Weight Gained Since Last Visit: 1lb Starting Weight: 08/22/2020 Total Weight Loss (lbs): 52 lb (23.6 kg)   Body Composition  Body Fat %: 28.3 % Fat Mass (lbs): 43.6 lbs Muscle Mass (lbs): 104.6 lbs Total Body Water (lbs): 70.8 lbs Visceral Fat Rating : 5   Other Clinical Data Fasting: yes Labs: yes Today's Visit #: 23 Starting Date: 08/22/20    Chief Complaint:   OBESITY [name] is here to discuss her progress with her obesity treatment plan. She is on the practicing portion control and making smarter food choices, such as increasing vegetables and decreasing simple carbohydrates and states she is following her eating plan approximately 97 % of the time. She states she is not currently exercising- FT RN at [employer]   Interim History:  She started at [facility] 08/22/2020, starting weight 205 lbs with corresponding BMI 28.59 Today her weight is 153 lbs with corresponding BMI  21.5 She has lost 51 lbs  Current Visceral Adipose Rating 5, at goal!   She is in Mx Phase- followed by [facility] Q10-12 weeks  She reports improved staffing in her nursing unit- she has not been required to work "on the floor" since August 2024. She endorses reduced stress at work.  Subjective:   1. Hyperlipidemia with T2D Lipid Panel     Component Value Date/Time   CHOL 144 03/31/2023 0848   TRIG 52 03/31/2023 0848   HDL 72 03/31/2023 0848   CHOLHDL 2.0 03/31/2023 0848   CHOLHDL 2.5 03/15/2019 0756   VLDL 12 08/22/2016 0851   LDLCALC 61 03/31/2023 0848   LDLCALC 105 (H) 03/15/2019 0756   LABVLDL 11 03/31/2023 0848   The 10-year ASCVD risk score (Arnett DK, et al., 2019) is: 4.3%   Values used to calculate the score:      Age: 52 years     Sex: Female     Is Non-Hispanic African American: Yes     Diabetic: Yes     Tobacco smoker: No     Systolic Blood Pressure: 120 mmHg     Is BP treated: Yes     HDL Cholesterol: 72 mg/dL     Total Cholesterol: 144 mg/dL   She is on twice weekly (wed/Sun) Crestor 10mg - managed by PCP/Dr. Lodema Hong   2. Type 2 diabetes mellitus with other specified complication, without long-term current use of insulin (HCC) Lab Results  Component Value Date   HGBA1C 5.2 11/17/2022   HGBA1C 5.2 06/29/2022   HGBA1C 5.2 11/19/2021    She denies sx's of hypoglycemia Denies mass in neck, dysphagia, dyspepsia, persistent hoarseness, abdominal pain, or N/V/C  She is on weekly Ozempic 1mg  injections  3. Vitamin D deficiency She is on bi-weekly Ergocalciferol- denies N/V/Muscle Weakness  Assessment/Plan:   1. Hyperlipidemia with T2D Continue twice weekly Crestor 10mg  and limiting saturated fat Increase daily activity  2. Type 2 diabetes mellitus with other specified complication, without long-term current use of insulin (HCC) Refill - Semaglutide, 1 MG/DOSE, 4 MG/3ML SOPN; Inject 1 mg into the skin once a week.  Dispense: 18 mL; Refill: 0 Check Labs - Hemoglobin A1c - Insulin, random  3. Vitamin D deficiency Refill - Vitamin D, Ergocalciferol, (DRISDOL) 1.25  MG (50000 UNIT) CAPS capsule; Take 1 capsule (50,000 Units total) by mouth every 14 (fourteen) days.  Dispense: 6 capsule; Refill: 0 Check Labs - VITAMIN D 25 Hydroxy (Vit-D Deficiency, Fractures)  4. Obesity, current BMI 21.35  [name] is currently in the action stage of change. As such, her goal is to continue with weight loss efforts. She has agreed to practicing portion control and making smarter food choices, such as increasing vegetables and decreasing simple carbohydrates.   Exercise goals: For substantial health benefits, adults should do at least 150 minutes (2 hours and 30 minutes) a week of moderate-intensity, or  75 minutes (1 hour and 15 minutes) a week of vigorous-intensity aerobic physical activity, or an equivalent combination of moderate- and vigorous-intensity aerobic activity. Aerobic activity should be performed in episodes of at least 10 minutes, and preferably, it should be spread throughout the week.  Behavioral modification strategies: increasing lean protein intake, decreasing simple carbohydrates, increasing vegetables, increasing water intake, no skipping meals, meal planning and cooking strategies, keeping healthy foods in the home, and planning for success.  [name] has agreed to follow-up with our clinic in 10 weeks. She was informed of the importance of frequent follow-up visits to maximize her success with intensive lifestyle modifications for her multiple health conditions.   [name] was informed we would discuss her lab results at her next visit unless there is a critical issue that needs to be addressed sooner. [name] agreed to keep her next visit at the agreed upon time to discuss these results.  Objective:   Blood pressure 120/67, pulse 85, temperature (!) 97.5 F (36.4 C), height 5\' 11"  (1.803 m), weight 153 lb (69.4 kg), SpO2 98%. Body mass index is 21.34 kg/m.  General: Cooperative, alert, well developed, in no acute distress. HEENT: Conjunctivae and lids unremarkable. Cardiovascular: Regular rhythm.  Lungs: Normal work of breathing. Neurologic: No focal deficits.   Lab Results  Component Value Date   CREATININE 0.77 03/31/2023   BUN 16 03/31/2023   NA 138 03/31/2023   K 4.3 03/31/2023   CL 103 03/31/2023   CO2 24 03/31/2023   Lab Results  Component Value Date   ALT 21 03/31/2023   AST 22 03/31/2023   ALKPHOS 63 03/31/2023   BILITOT 0.3 03/31/2023   Lab Results  Component Value Date   HGBA1C 5.2 11/17/2022   HGBA1C 5.2 06/29/2022   HGBA1C 5.2 11/19/2021   HGBA1C 5.2 07/09/2021   HGBA1C 5.0 04/15/2021   Lab Results  Component Value Date   INSULIN 28.7  (H) 11/19/2021   INSULIN 8.6 07/09/2021   INSULIN 19.2 04/15/2021   INSULIN 7.8 08/22/2020   Lab Results  Component Value Date   TSH 1.210 03/31/2023   Lab Results  Component Value Date   CHOL 144 03/31/2023   HDL 72 03/31/2023   LDLCALC 61 03/31/2023   TRIG 52 03/31/2023   CHOLHDL 2.0 03/31/2023   Lab Results  Component Value Date   VD25OH 60.6 11/17/2022   VD25OH 60.0 06/29/2022   VD25OH 58.1 10/30/2021   Lab Results  Component Value Date   WBC 6.0 06/04/2022   HGB 12.0 06/04/2022   HCT 35.6 (L) 06/04/2022   MCV 89.4 06/04/2022   PLT 205 06/04/2022   Lab Results  Component Value Date   IRON 56 06/04/2022   TIBC 299 06/04/2022   FERRITIN 146 06/04/2022    Attestation Statements:   Reviewed by clinician on day of visit: allergies, medications, problem list, medical history, surgical  history, family history, social history, and previous encounter notes.  I have reviewed the above documentation for accuracy and completeness, and I agree with the above. Orpha Bur D. Kemonie Cutillo, NP-C

## 2023-04-15 ENCOUNTER — Encounter (INDEPENDENT_AMBULATORY_CARE_PROVIDER_SITE_OTHER): Payer: Self-pay | Admitting: Adult Health

## 2023-04-15 LAB — VITAMIN D 25 HYDROXY (VIT D DEFICIENCY, FRACTURES): Vit D, 25-Hydroxy: 63.4 ng/mL (ref 30.0–100.0)

## 2023-04-15 LAB — INSULIN, RANDOM: INSULIN: 2.9 u[IU]/mL (ref 2.6–24.9)

## 2023-04-15 LAB — HEMOGLOBIN A1C
Est. average glucose Bld gHb Est-mCnc: 103 mg/dL
Hgb A1c MFr Bld: 5.2 % (ref 4.8–5.6)

## 2023-04-20 ENCOUNTER — Other Ambulatory Visit (HOSPITAL_COMMUNITY): Payer: Self-pay

## 2023-04-21 ENCOUNTER — Other Ambulatory Visit (HOSPITAL_COMMUNITY): Payer: Self-pay

## 2023-04-21 ENCOUNTER — Encounter (HOSPITAL_COMMUNITY): Payer: Self-pay | Admitting: Hematology

## 2023-04-23 ENCOUNTER — Encounter (HOSPITAL_COMMUNITY): Payer: Self-pay | Admitting: Hematology

## 2023-04-23 ENCOUNTER — Encounter: Payer: Self-pay | Admitting: Family Medicine

## 2023-04-23 ENCOUNTER — Other Ambulatory Visit (HOSPITAL_COMMUNITY): Payer: Self-pay

## 2023-04-23 ENCOUNTER — Other Ambulatory Visit: Payer: Self-pay

## 2023-04-23 MED ORDER — DICLOFENAC SODIUM 50 MG PO TBEC
50.0000 mg | DELAYED_RELEASE_TABLET | Freq: Two times a day (BID) | ORAL | 3 refills | Status: DC | PRN
  Filled 2023-04-23: qty 60, 30d supply, fill #0
  Filled 2023-09-15: qty 60, 30d supply, fill #1

## 2023-04-23 MED ORDER — MISOPROSTOL 200 MCG PO TABS
200.0000 ug | ORAL_TABLET | Freq: Two times a day (BID) | ORAL | 3 refills | Status: DC | PRN
  Filled 2023-04-23: qty 60, 30d supply, fill #0
  Filled 2023-09-15: qty 60, 30d supply, fill #1
  Filled 2023-12-21: qty 60, 30d supply, fill #2
  Filled 2024-01-22: qty 60, 30d supply, fill #3

## 2023-04-26 ENCOUNTER — Encounter (HOSPITAL_COMMUNITY): Payer: Self-pay | Admitting: Hematology

## 2023-04-26 ENCOUNTER — Other Ambulatory Visit: Payer: Self-pay

## 2023-05-04 ENCOUNTER — Other Ambulatory Visit (HOSPITAL_COMMUNITY): Payer: Self-pay

## 2023-05-18 ENCOUNTER — Other Ambulatory Visit (HOSPITAL_COMMUNITY): Payer: Self-pay

## 2023-05-25 ENCOUNTER — Other Ambulatory Visit (HOSPITAL_COMMUNITY): Payer: Self-pay

## 2023-05-25 ENCOUNTER — Other Ambulatory Visit: Payer: Self-pay | Admitting: Family Medicine

## 2023-05-25 ENCOUNTER — Other Ambulatory Visit: Payer: Self-pay

## 2023-05-25 MED ORDER — FAMOTIDINE 40 MG PO TABS
40.0000 mg | ORAL_TABLET | Freq: Every day | ORAL | 1 refills | Status: DC
  Filled 2023-05-25: qty 90, 90d supply, fill #0
  Filled 2023-08-25: qty 90, 90d supply, fill #1

## 2023-06-03 ENCOUNTER — Inpatient Hospital Stay: Payer: 59 | Attending: Hematology

## 2023-06-03 DIAGNOSIS — D500 Iron deficiency anemia secondary to blood loss (chronic): Secondary | ICD-10-CM | POA: Diagnosis not present

## 2023-06-03 DIAGNOSIS — D72820 Lymphocytosis (symptomatic): Secondary | ICD-10-CM | POA: Diagnosis not present

## 2023-06-03 DIAGNOSIS — N920 Excessive and frequent menstruation with regular cycle: Secondary | ICD-10-CM | POA: Diagnosis not present

## 2023-06-03 LAB — IRON AND TIBC
Iron: 66 ug/dL (ref 28–170)
Saturation Ratios: 19 % (ref 10.4–31.8)
TIBC: 340 ug/dL (ref 250–450)
UIBC: 274 ug/dL

## 2023-06-03 LAB — CBC WITH DIFFERENTIAL/PLATELET
Abs Immature Granulocytes: 0.01 10*3/uL (ref 0.00–0.07)
Basophils Absolute: 0 10*3/uL (ref 0.0–0.1)
Basophils Relative: 1 %
Eosinophils Absolute: 0.1 10*3/uL (ref 0.0–0.5)
Eosinophils Relative: 1 %
HCT: 39.4 % (ref 36.0–46.0)
Hemoglobin: 13.2 g/dL (ref 12.0–15.0)
Immature Granulocytes: 0 %
Lymphocytes Relative: 62 %
Lymphs Abs: 3.2 10*3/uL (ref 0.7–4.0)
MCH: 31.1 pg (ref 26.0–34.0)
MCHC: 33.5 g/dL (ref 30.0–36.0)
MCV: 92.7 fL (ref 80.0–100.0)
Monocytes Absolute: 0.5 10*3/uL (ref 0.1–1.0)
Monocytes Relative: 10 %
Neutro Abs: 1.4 10*3/uL — ABNORMAL LOW (ref 1.7–7.7)
Neutrophils Relative %: 26 %
Platelets: 237 10*3/uL (ref 150–400)
RBC: 4.25 MIL/uL (ref 3.87–5.11)
RDW: 12.8 % (ref 11.5–15.5)
WBC: 5.3 10*3/uL (ref 4.0–10.5)
nRBC: 0 % (ref 0.0–0.2)

## 2023-06-03 LAB — FERRITIN: Ferritin: 116 ng/mL (ref 11–307)

## 2023-06-08 NOTE — Progress Notes (Unsigned)
[facility] [address] [address]   CLINIC:  Medical Oncology/Hematology  PCP:  Kerri Perches, MD [address] [place] [address] [phone]   REASON FOR VISIT:  Follow-up for iron deficiency anemia   CURRENT THERAPY: Intermittent IV iron infusions (last Venofer 07/18/2021)  INTERVAL HISTORY:   [name] 52 y.o. female returns for routine follow-up of iron deficiency anemia.  She was last seen by Rojelio Brenner PA-C on 06/10/2022.  Her last IV iron infusion was on 07/18/2021.     At today's visit, she reports feeling fairly well.  *** *** She had Sonata ablation procedure on 08/11/2021 and reports that her menstrual cycles have been lighter and more irregular since that time.  Her last menstrual cycle was in April 2023 (2 months ago), and was not as heavy as her previous cycles were.  *** *** She continues to have occasional intermittent fatigue and "mental fog" but not as significant as in the past. *** She denies any other sources of bleeding such as bright red blood per rectum or melena. *** No pica, restless legs, headaches, chest pain, dyspnea on exertion, or syncope.  Regarding her intermittent lymphocytosis, she has not noticed any recent fever, chills, night sweats, unintentional weight loss, lymphadenopathy, or infections.***  She has ***% energy and ***% appetite. She endorses that she is maintaining a stable weight.   ASSESSMENT & PLAN:  1.  Iron deficiency anemia -  Secondary to chronic blood loss (menorrhagia) and malabsorption in the setting of PPI use - Episode of heavy bleeding in October 2022, reportedly bled for about 48 consecutive days. - She had Sonata ablation procedure in February 2023, and reports much lighter and more irregular menstrual cycle since that time*** - Failed to improve on oral iron tablets - Has received several rounds of IV Venofer, last given on 07/18/2021 - Hemoccult stool x3  negative, denies melena or bright red blood per rectum    *** - Work-up for other causes of anemia unremarkable (normal methylmalonic acid, B12, folate, copper, LDH, IFE/SPEP/FLC); CMP (08/22/2020) showed normal creatinine 0.78, normal liver function. - Normal screening colonoscopy in May 2021; no history of EGD - Continues to have intermittent mild fatigue, but much improved from before*** - Most recent labs (06/03/2023): Hgb 13.2, ferritin 116, iron saturation 19% - PLAN: No indication for IV iron. - Since she has not required IV iron for 2 years, and since the source of blood loss (menorrhagia) has been successfully treated, we will tentatively discharge from clinic to PCP. - Of course, if patient develops any recurrent iron deficiency in the future, we would be happy to see her again as needed.  *** ***CC CHART TO PCP***   2.  Lymphocytosis - Review of labs shows that she has had mild lymphocytosis since at least March 2022 - Pathology smear review confirmed absolute lymphocytosis - Flow cytometry (03/28/2021) showed predominance of T cells with relative abundance of CD8 positive cells, no monoclonal B-cell population identified.  T-cell changes are not considered specific or diagnostic of lymphoproliferative disorder at this time may be secondary to infection or autoimmune disease, etc.  Clinical correlation and follow-up recommended. - She denies any new lumps or bumps.  No fever, chills, night sweats, unintentional weight loss.    *** - Most recent CBC (06/03/2023): Lymphocytes have normalized at 3.2 - PLAN: Lymphocytosis has resolved.  No further workup at this time.  ***   3.  Other  history - Her other past medical history is significant for chronic back pain, history of SVT s/p ablation, hypertension, diet-controlled diabetes mellitus type 2. 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  She is former smoker (10 pack-year history), quit over 10 years ago.  She denies alcohol or drug use. -  Patient's mother had uterine caner, father with prostate cancer.  No other known family history of anemia or cancers.  PLAN SUMMARY: >> Tentative discharge from clinic.  Follow-up as needed. >> *** >> ***    REVIEW OF SYSTEMS: ***  Review of Systems - Oncology   PHYSICAL EXAM:  ECOG PERFORMANCE STATUS: {CHL ONC ECOG ZO:[id number]} *** There were no vitals filed for this visit. There were no vitals filed for this visit. Physical Exam  PAST MEDICAL/SURGICAL HISTORY:  Past Medical History:  Diagnosis Date   Anemia    Arthritis    Phreesia 03/29/2020   Back pain    DDD (degenerative disc disease), lumbar    Diabetes mellitus without complication (HCC)    Phreesia 03/29/2020   Essential hypertension, benign    GERD (gastroesophageal reflux disease)    Hypertension    Phreesia 03/29/2020   Joint pain    Lactose intolerance    Lumbar herniated disc    Migraine headache 2007   Obesity    Since childhood    Osteoarthritis    Pneumonia 2007   PONV (postoperative nausea and vomiting)    Patient states she woke up during tubal ligation and has had PONV in the past.   Sacroiliac joint disease    Spinal disease    Supraventricular tachycardia [facility])    Status post RFA July 2015 - Dr. Ladona Ridgel   Type 2 diabetes mellitus (HCC)    Varicose vein of leg    Vitamin D deficiency    Weight loss    Hopitalized 4 years ago after loosing 200lb on a supervised program, malnurished with severe protein deficiency    Past Surgical History:  Procedure Laterality Date   ABLATION  12/28/2013   slow pathway modification of AVNRT by Dr Ladona Ridgel   BREAST BIOPSY Right 10/01/2015   BENIGN ADIPOSE TISSUE   COLONOSCOPY N/A 11/17/2019   Procedure: COLONOSCOPY;  Surgeon: Corbin Ade, MD;  Location: [facility];  Service: Endoscopy;  Laterality: N/A;  7:30   ELECTROPHYSIOLOGY STUDY N/A 12/28/2013   Procedure: ELECTROPHYSIOLOGY STUDY;  Surgeon: Marinus Maw, MD;  Location: [facility] CATH LAB;  Service:  Cardiovascular;  Laterality: N/A;   HYSTEROSCOPY WITH D & C N/A 08/19/2021   Procedure: DILATATION AND CURETTAGE /HYSTEROSCOPY;  Surgeon: Jerene Bears, MD;  Location: [facility] OR;  Service: Gynecology;  Laterality: N/A;   SUPRAVENTRICULAR TACHYCARDIA ABLATION N/A 12/28/2013   Procedure: SUPRAVENTRICULAR TACHYCARDIA ABLATION;  Surgeon: Marinus Maw, MD;  Location: [facility] C[facility] LAB;  Service: Cardiovascular;  Laterality: N/A;   TUBAL LIGATION  06/22/1994    SOCIAL HISTORY:  Social History   Socioeconomic History   Marital status: Married    Spouse name: Not on file   Number of children: 2   Years of education: Not on file   Highest education level: Bachelor's degree (e.g., BA, AB, BS)  Occupational History   Occupation: Garment/textile technologist at [employer]  Tobacco Use   Smoking status: Former    Current packs/day: 0.00    Average packs/day: 0.5 packs/day for 7.0 years (3.5 ttl pk-yrs)    Types: Cigarettes    Start date: 08/05/2005    Quit  date: 08/05/2012    Years since quitting: 10.8   Smokeless tobacco: Never   Tobacco comments:    quit in 2014  Vaping Use   Vaping status: Never Used  Substance and Sexual Activity   Alcohol use: No    Alcohol/week: 0.0 standard drinks of alcohol   Drug use: No   Sexual activity: Yes    Birth control/protection: Surgical    Comment: tubal ligation  Other Topics Concern   Not on file  Social History Narrative   Not on file   Social Drivers of Health   Financial Resource Strain: Low Risk  (04/07/2023)   Overall Financial Resource Strain (CARDIA)    Difficulty of Paying Living Expenses: Not hard at all  Food Insecurity: No Food Insecurity (04/07/2023)   Hunger Vital Sign    Worried About Running Out of Food in the Last Year: Never true    Ran Out of Food in the Last Year: Never true  Transportation Needs: No Transportation Needs (04/07/2023)   PRAPARE - Administrator, Civil Service (Medical): No    Lack of Transportation (Non-Medical): No   Physical Activity: Inactive (04/07/2023)   Exercise Vital Sign    Days of Exercise per Week: 0 days    Minutes of Exercise per Session: 60 min  Stress: No Stress Concern Present (04/07/2023)   [employer] of Occupational Health - Occupational Stress Questionnaire    Feeling of Stress : Only a little  Social Connections: Moderately Integrated (04/07/2023)   Social Connection and Isolation Panel [NHANES]    Frequency of Communication with Friends and Family: More than three times a week    Frequency of Social Gatherings with Friends and Family: Once a week    Attends Religious Services: 1 to 4 times per year    Active Member of [employer] or Organizations: No    Attends Banker Meetings: Never    Marital Status: Married  Catering manager Violence: Not At Risk (01/20/2021)   Humiliation, Afraid, Rape, and Kick questionnaire    Fear of Current or Ex-Partner: No    Emotionally Abused: No    Physically Abused: No    Sexually Abused: No    FAMILY HISTORY:  Family History  Problem Relation Age of Onset   Hypertension Mother    Diabetes Mother    Cancer Mother        Uterine    Heart failure Mother        CABG   Hyperlipidemia Mother    Heart disease Mother    Thyroid disease Mother    Cancer Father 36       Prostate    Hypertension Father    Bradycardia Father        Irregular heart beat    Hyperlipidemia Father    Kidney disease Father    Diabetes Brother    Diabetes Brother    Hypertension Brother    Hypertension Brother     CURRENT MEDICATIONS:  Outpatient Encounter Medications as of 06/10/2023  Medication Sig Note   acetaminophen (TYLENOL) 500 MG tablet Take 1,000 mg by mouth in the morning and at bedtime. 11/04/2021: Takes one at bedtime 10/2021   cyclobenzaprine (FLEXERIL) 10 MG tablet Take 1 tablet (10 mg total) by mouth at bedtime.    diclofenac (VOLTAREN) 50 MG EC tablet Take 1 tablet (50 mg total) by mouth 2 (two) times daily as needed for  uncontrolled pain    estradiol (ESTRACE) 0.1 MG/GM vaginal  cream Insert 1 gram vaginally twice weekly    famotidine (PEPCID) 40 MG tablet Take 1 tablet (40 mg total) by mouth daily when you take ibuprofen    ferrous sulfate 325 (65 FE) MG tablet Take 325 mg by mouth every other day. 08/11/2021: Slow Iron   losartan (COZAAR) 25 MG tablet Take 1 tablet (25 mg total) by mouth daily.    misoprostol (CYTOTEC) 200 MCG tablet Take 1 tablet (200 mcg total) by mouth 2 (two) times daily as needed when taking diclofenac    rosuvastatin (CRESTOR) 10 MG tablet Take 1 tablet by mouth 2 times weekly (Wed/Sun)    Semaglutide, 1 MG/DOSE, 4 MG/3ML SOPN Inject 1 mg into the skin once a week.    Vitamin D, Ergocalciferol, (DRISDOL) 1.25 MG (50000 UNIT) CAPS capsule Take 1 capsule (50,000 Units total) by mouth every 14 (fourteen) days.    No facility-administered encounter medications on file as of 06/10/2023.    ALLERGIES:  Allergies  Allergen Reactions   Codeine Nausea And Vomiting   Keflex [Cephalexin] Nausea And Vomiting    Nausea and  diarrheah   Metoclopramide Hcl Other (See Comments)    Not in right state of mind    LABORATORY DATA:  I have reviewed the labs as listed.  CBC    Component Value Date/Time   WBC 5.3 06/03/2023 0903   RBC 4.25 06/03/2023 0903   HGB 13.2 06/03/2023 0903   HGB 9.9 (L) 09/23/2020 1501   HCT 39.4 06/03/2023 0903   HCT 31.9 (L) 09/23/2020 1501   PLT 237 06/03/2023 0903   PLT 281 09/23/2020 1501   MCV 92.7 06/03/2023 0903   MCV 77 (L) 09/23/2020 1501   MCH 31.1 06/03/2023 0903   MCHC 33.5 06/03/2023 0903   RDW 12.8 06/03/2023 0903   RDW 15.8 (H) 09/23/2020 1501   LYMPHSABS 3.2 06/03/2023 0903   LYMPHSABS 5.0 (H) 08/22/2020 0956   MONOABS 0.5 06/03/2023 0903   EOSABS 0.1 06/03/2023 0903   EOSABS 0.1 08/22/2020 0956   BASOSABS 0.0 06/03/2023 0903   BASOSABS 0.1 08/22/2020 0956      Latest Ref Rng & Units 03/31/2023    8:48 AM 04/30/2022    8:08 AM 10/30/2021     4:10 PM  CMP  Glucose 70 - 99 mg/dL 70  79  64   BUN 6 - 24 mg/dL 16  16  16    Creatinine 0.57 - 1.00 mg/dL 1.61  0.96  0.45   Sodium 134 - 144 mmol/L 138  141  140   Potassium 3.5 - 5.2 mmol/L 4.3  4.0  4.2   Chloride 96 - 106 mmol/L 103  101  101   CO2 20 - 29 mmol/L 24  25  24    Calcium 8.7 - 10.2 mg/dL 9.4  9.9  9.8   Total Protein 6.0 - 8.5 g/dL 7.5  7.8  8.4   Total Bilirubin 0.0 - 1.2 mg/dL 0.3  0.3  0.2   Alkaline Phos 44 - 121 IU/L 63  57  65   AST 0 - 40 IU/L 22  16  16    ALT 0 - 32 IU/L 21  19  13      DIAGNOSTIC IMAGING:  I have independently reviewed the relevant imaging and discussed with the patient.   WRAP UP:  All questions were answered. The patient knows to call the clinic with any problems, questions or concerns.  Medical decision making: ***  Time spent on visit: I  spent *** minutes counseling the patient face to face. The total time spent in the appointment was *** minutes and more than 50% was on counseling.  Carnella Guadalajara, PA-C  ***       INTERVAL HISTORY:  [name] 52 y.o. female returns for routine follow-up of        DIAGNOSTIC IMAGING:  I have independently reviewed the relevant imaging and discussed with the patient.  ASSESSMENT & PLAN: 1.

## 2023-06-10 ENCOUNTER — Inpatient Hospital Stay: Payer: 59 | Admitting: Physician Assistant

## 2023-06-10 ENCOUNTER — Ambulatory Visit: Payer: 59 | Admitting: Orthopedic Surgery

## 2023-06-10 ENCOUNTER — Other Ambulatory Visit (INDEPENDENT_AMBULATORY_CARE_PROVIDER_SITE_OTHER): Payer: 59

## 2023-06-10 VITALS — BP 136/76 | HR 70 | Ht 71.0 in | Wt 153.0 lb

## 2023-06-10 VITALS — BP 128/65 | HR 71 | Temp 97.0°F | Resp 18 | Ht 71.0 in | Wt 155.2 lb

## 2023-06-10 DIAGNOSIS — M545 Low back pain, unspecified: Secondary | ICD-10-CM | POA: Diagnosis not present

## 2023-06-10 DIAGNOSIS — N920 Excessive and frequent menstruation with regular cycle: Secondary | ICD-10-CM | POA: Diagnosis not present

## 2023-06-10 DIAGNOSIS — D500 Iron deficiency anemia secondary to blood loss (chronic): Secondary | ICD-10-CM | POA: Diagnosis not present

## 2023-06-10 DIAGNOSIS — D72820 Lymphocytosis (symptomatic): Secondary | ICD-10-CM | POA: Diagnosis not present

## 2023-06-10 MED ORDER — PREGABALIN 75 MG PO CAPS: 75.0000 mg | ORAL_CAPSULE | Freq: Two times a day (BID) | ORAL | 0 refills | Status: DC

## 2023-06-10 NOTE — Progress Notes (Signed)
Orthopedic Spine Surgery Office Note  Assessment: Patient is a 52 y.o. female with low back pain that radiates into her left anterior hip and anterior proximal thigh possible radiculopathy   Plan: -Explained that initially conservative treatment is tried as a significant number of patients may experience relief with these treatment modalities. Discussed that the conservative treatments include:  -activity modification  -physical therapy  -over the counter pain medications  -medrol dosepak  -lumbar steroid injections -Patient has tried Tylenol, Aleve -Recommended she continue with the over-the-counter medications.  Discussed a Medrol Dosepak as a possible treatment but patient was not interested.  Instead, she wanted to try Lyrica so that was prescribed today -If she is not doing any better at her next visit, will order an MRI of the lumbar spine to evaluate for radiculopathy -Patient should return to office in 6 weeks, x-rays at next visit: None   Patient expressed understanding of the plan and all questions were answered to the patient's satisfaction.   ___________________________________________________________________________   History:  Patient is a 52 y.o. female who presents today for lumbar spine.  Patient noted onset of pain starting on 05/13/2023.  She said she woke up that day and had pain.  The pain was in her low back on the left side and it radiated into the left lateral and anterior hip.  It also radiate into the proximal anterior thigh.  She has numbness and paresthesias in the same distribution as the pain.  She does not have any pain radiating into the right lower extremity.  There is no trauma or injury that preceded the onset of pain.  She has not found lasting relief with the over-the-counter medication she has been trying.   Weakness: Denies Symptoms of imbalance: Denies Paresthesias and numbness: Yes, has numbness and paresthesias in the left lateral hip, anterior  hip, and proximal anterior thigh.  No other numbness or paresthesias Bowel or bladder incontinence: Denies Saddle anesthesia: Denies  Treatments tried: Tylenol, Aleve  Review of systems: Denies fevers and chills, night sweats, unexplained weight loss, history of cancer.  Has had pain that wakes her at night  Past medical history: HLD HTN GERD Diabetes (last A1c was 5.2 on 04/14/2023)  Allergies: codeine, keflex, metoclopramide  Past surgical history:  Cardiac ablation Hysteroscopy with dilation and curettage Tubal ligation  Social history: Denies use of nicotine product (smoking, vaping, patches, smokeless) Alcohol use: Denies Denies recreational drug use   Physical Exam:  BMI of 21.3  General: no acute distress, appears stated age Neurologic: alert, answering questions appropriately, following commands Respiratory: unlabored breathing on room air, symmetric chest rise Psychiatric: appropriate affect, normal cadence to speech   MSK (spine):  -Strength exam      Left  Right EHL    5/5  5/5 TA    5/5  5/5 GSC    5/5  5/5 Knee extension  5/5  5/5 Hip flexion   5/5  5/5  -Sensory exam    Sensation intact to light touch in L3-S1 nerve distributions of bilateral lower extremities  -Achilles DTR: 1/4 on the left, 1/4 on the right -Patellar tendon DTR: 1/4 on the left, 1/4 on the right  -Straight leg raise: Negative bilaterally -Femoral nerve stretch test: Negative bilaterally -Clonus: no beats bilaterally  -Left hip exam: No pain through range of motion, negative Stinchfield, negative FABER, negative SI joint compression test -Right hip exam: No pain through range of motion  Imaging: XRs of the lumbar spine from 06/10/2023 was  independently reviewed and interpreted, showing disc height loss at L3/4, L4/5, L5/S1.  Small anterior osteophyte formation seen at those levels as well.  No fracture or dislocation seen.  No evidence of instability on flexion/extension  views.   Patient name: [name] Patient MRN: [id number] Date of visit: 06/10/23

## 2023-06-10 NOTE — Patient Instructions (Signed)
[facility] at [facility] Discharge Instructions  You were seen today by Rojelio Brenner PA-C for your IRON DEFICIENCY ANEMIA.  Your blood and iron levels look great right now!    Since you are not bleeding as much after you had your ablation procedure, your blood and iron levels have been stable.  You have not needed any IV iron for the past two years.  Therefore, we will discharge you from our clinic.  You do not need any further appointments at the Cancer Center at this time.  Your primary care doctor can continue to monitor your iron levels by checking your ferritin levels once a year (goal is to keep ferritin higher than 100).  I recommend that you continue taking iron tablet every other day, since you are still losing blood on a regular basis from your menstrual periods.  Once you have gone through menopause, you may be able to stop taking the iron pill.  If you have any need for IV iron or our other specialized expertise in the future, we would be happy to see you again.  It has been our pleasure taking care of you for these past few years!   ** Thank you for trusting me with your healthcare!  I strive to provide all of my patients with quality care at each visit.  If you receive a survey for this visit, I would be so grateful to you for taking the time to provide feedback.  Thank you in advance!  ~ Mekhai Venuto                   Dr. Doreatha Massed   &   Rojelio Brenner, PA-C   - - - - - - - - - - - - - - - - - -    Thank you for choosing [facility] at [facility] to provide your oncology and hematology care.  To afford each patient quality time with our provider, please arrive at least 15 minutes before your scheduled appointment time.   If you have a lab appointment with the Cancer Center please come in thru the Main Entrance and check in at the main information desk.  You need to re-schedule your appointment should you arrive 10 or  more minutes late.  We strive to give you quality time with our providers, and arriving late affects you and other patients whose appointments are after yours.  Also, if you no show three or more times for appointments you may be dismissed from the clinic at the providers discretion.     Again, thank you for choosing [facility].  Our hope is that these requests will decrease the amount of time that you wait before being seen by our physicians.       _____________________________________________________________  Should you have questions after your visit to [facility], please contact our office at [phone] and follow the prompts.  Our office hours are 8:00 a.m. and 4:30 p.m. Monday - Friday.  Please note that voicemails left after 4:00 p.m. may not be returned until the following business day.  We are closed weekends and major holidays.  You do have access to a nurse 24-7, just call the main number to the clinic [phone] and do not press any options, hold on the line and a nurse will answer the phone.    For prescription refill requests, have your pharmacy contact our office and allow  72 hours.    Due to Covid, you will need to wear a mask upon entering the hospital. If you do not have a mask, a mask will be given to you at the Main Entrance upon arrival. For doctor visits, patients may have 1 support person age 75 or older with them. For treatment visits, patients can not have anyone with them due to social distancing guidelines and our immunocompromised population.

## 2023-06-20 ENCOUNTER — Other Ambulatory Visit (HOSPITAL_COMMUNITY): Payer: Self-pay

## 2023-06-20 ENCOUNTER — Other Ambulatory Visit: Payer: Self-pay | Admitting: Family Medicine

## 2023-06-20 DIAGNOSIS — E785 Hyperlipidemia, unspecified: Secondary | ICD-10-CM

## 2023-06-21 ENCOUNTER — Other Ambulatory Visit (HOSPITAL_BASED_OUTPATIENT_CLINIC_OR_DEPARTMENT_OTHER): Payer: Self-pay

## 2023-06-21 ENCOUNTER — Other Ambulatory Visit (HOSPITAL_COMMUNITY): Payer: Self-pay

## 2023-06-21 MED ORDER — ROSUVASTATIN CALCIUM 10 MG PO TABS
ORAL_TABLET | ORAL | 2 refills | Status: DC
  Filled 2023-06-21: qty 24, 84d supply, fill #0
  Filled 2023-09-13: qty 24, 84d supply, fill #1

## 2023-06-28 ENCOUNTER — Ambulatory Visit (INDEPENDENT_AMBULATORY_CARE_PROVIDER_SITE_OTHER): Payer: 59 | Admitting: Adult Health

## 2023-06-29 ENCOUNTER — Encounter (HOSPITAL_COMMUNITY): Payer: Self-pay | Admitting: Hematology

## 2023-06-29 ENCOUNTER — Other Ambulatory Visit: Payer: Self-pay

## 2023-06-29 ENCOUNTER — Ambulatory Visit (INDEPENDENT_AMBULATORY_CARE_PROVIDER_SITE_OTHER): Payer: Commercial Managed Care - PPO | Admitting: Adult Health

## 2023-06-29 ENCOUNTER — Encounter (INDEPENDENT_AMBULATORY_CARE_PROVIDER_SITE_OTHER): Payer: Self-pay | Admitting: Adult Health

## 2023-06-29 VITALS — BP 145/79 | HR 73 | Temp 97.7°F | Ht 71.0 in | Wt 152.0 lb

## 2023-06-29 DIAGNOSIS — E1169 Type 2 diabetes mellitus with other specified complication: Secondary | ICD-10-CM

## 2023-06-29 DIAGNOSIS — Z6830 Body mass index (BMI) 30.0-30.9, adult: Secondary | ICD-10-CM

## 2023-06-29 DIAGNOSIS — E559 Vitamin D deficiency, unspecified: Secondary | ICD-10-CM

## 2023-06-29 DIAGNOSIS — E669 Obesity, unspecified: Secondary | ICD-10-CM

## 2023-06-29 DIAGNOSIS — Z7985 Long-term (current) use of injectable non-insulin antidiabetic drugs: Secondary | ICD-10-CM

## 2023-06-29 DIAGNOSIS — Z6821 Body mass index (BMI) 21.0-21.9, adult: Secondary | ICD-10-CM

## 2023-06-29 DIAGNOSIS — I10 Essential (primary) hypertension: Secondary | ICD-10-CM | POA: Diagnosis not present

## 2023-06-29 MED ORDER — SEMAGLUTIDE (1 MG/DOSE) 4 MG/3ML ~~LOC~~ SOPN
1.0000 mg | PEN_INJECTOR | SUBCUTANEOUS | 0 refills | Status: DC
  Filled 2023-06-29 – 2023-07-18 (×2): qty 9, 84d supply, fill #0

## 2023-06-29 MED ORDER — VITAMIN D (ERGOCALCIFEROL) 1.25 MG (50000 UNIT) PO CAPS
50000.0000 [IU] | ORAL_CAPSULE | ORAL | 0 refills | Status: DC
  Filled 2023-06-29 – 2023-07-22 (×2): qty 6, 84d supply, fill #0

## 2023-06-29 NOTE — Progress Notes (Signed)
 WEIGHT SUMMARY AND BIOMETRICS  Vitals Temp: 97.7 F (36.5 C) BP: (!) 145/79 Pulse Rate: 73 SpO2: 97 %   Anthropometric Measurements Height: 5' 11 (1.803 m) Weight: 152 lb (68.9 kg) BMI (Calculated): 21.21 Weight at Last Visit: 153lb Weight Lost Since Last Visit: 1lb Weight Gained Since Last Visit: 0 Starting Weight: 205lb Total Weight Loss (lbs): 53 lb (24 kg)   Body Composition  Body Fat %: 28.3 % Fat Mass (lbs): 43 lbs Muscle Mass (lbs): 103.4 lbs Total Body Water  (lbs): 73 lbs Visceral Fat Rating : 5   Other Clinical Data Fasting: no Labs: no Today's Visit #: 24 Starting Date: 08/22/20    Chief Complaint:   OBESITY [name] is here to discuss her progress with her obesity treatment plan. She is on the practicing portion control and making smarter food choices, such as increasing vegetables and decreasing simple carbohydrates and states she is following her eating plan approximately 95 % of the time. She states she is exercising: NEAT Activities.   Interim History:  [name] is in Mx Phase- followed by [facility] Q12W She is RN at [employer]  She provided the following food recall that is typical of a day: Breakfast: 2 eggs Sausage Oatmeal Coffee with splenda (1 cup)  Lunch:  Meat sandwich with lettuce, tomato, and mustard Cottage cheese with berries  Dinner: Power Bowl with meat protein (usually chicken) and grilled vegetables  Evening Snack: PB2 with either smart cakes or rice cakes  She is on weekly Ozempic  1mg - denies over suppression of appetite  Subjective:   1. Primary hypertension Discussed Labs 03/31/2023: CMP- electrolytes, kidney/liver enzymes- stable BP slightly above goal She denies CP with exertion She is on  losartan  (COZAAR ) 25 MG tablet  rosuvastatin  (CRESTOR ) 10 MG tablet   2. Type 2 diabetes mellitus with other specified complication, without long-term current use of insulin  (HCC) Discussed Labs Lab Results   Component Value Date   HGBA1C 5.2 04/14/2023   HGBA1C 5.2 11/17/2022   HGBA1C 5.2 06/29/2022     Latest Reference Range & Units 04/14/23 09:33  INSULIN  2.6 - 24.9 uIU/mL 2.9   A1c and Insulin  levels are at goal She reports fasting CBG 80-90s She denies sx's of hypoglycemia She is on weekly Ozempic  1mg  Denies mass in neck, dysphagia, dyspepsia, persistent hoarseness, abdominal pain, or N/V/C  She denies appetite suppression- able to consume 3 meals with nightly snacks/day.  3. Vitamin D  deficiency Discussed Labs  Latest Reference Range & Units 04/14/23 09:33  Vitamin D , 25-Hydroxy 30.0 - 100.0 ng/mL 63.4   She is on bi-weekly Ergocalciferol - denies N/V/Muscle Weakness. Level at goal.  Assessment/Plan:   1. Primary hypertension (Primary) Continue  losartan  (COZAAR ) 25 MG tablet   2. Type 2 diabetes mellitus with other specified complication, without long-term current use of insulin  (HCC) Refill - Semaglutide , 1 MG/DOSE, 4 MG/3ML SOPN; Inject 1 mg into the skin once a week.  Dispense: 9 mL; Refill: 0  Will reduce dose from 1mg  to 0.5mg  if any more weight loss ensues. Continue to eat a well balanced meals  3. Vitamin D  deficiency Refill - Vitamin D , Ergocalciferol , (DRISDOL ) 1.25 MG (50000 UNIT) CAPS capsule; Take 1 capsule (50,000 Units total) by mouth every 14 (fourteen) days.  Dispense: 6 capsule; Refill: 0  4. Obesity, current BMI 21.21  [name] is currently in the action stage of change. As such, her goal is to maintain weight for now. She has agreed to practicing portion control  and making smarter food choices, such as increasing vegetables and decreasing simple carbohydrates.   Exercise goals: All adults should avoid inactivity. Some physical activity is better than none, and adults who participate in any amount of physical activity gain some health benefits. Adults should also include muscle-strengthening activities that involve all major muscle groups on 2 or more  days a week.  Behavioral modification strategies: increasing lean protein intake, decreasing simple carbohydrates, increasing vegetables, increasing water  intake, no skipping meals, meal planning and cooking strategies, keeping healthy foods in the home, ways to avoid boredom eating, and planning for success.  [name] has agreed to follow-up with our clinic in 12 weeks. She was informed of the importance of frequent follow-up visits to maximize her success with intensive lifestyle modifications for her multiple health conditions.   Objective:   Blood pressure (!) 145/79, pulse 73, temperature 97.7 F (36.5 C), height 5' 11 (1.803 m), weight 152 lb (68.9 kg), SpO2 97%. Body mass index is 21.2 kg/m.  General: Cooperative, alert, well developed, in no acute distress. HEENT: Conjunctivae and lids unremarkable. Cardiovascular: Regular rhythm.  Lungs: Normal work of breathing. Neurologic: No focal deficits.   Lab Results  Component Value Date   CREATININE 0.77 03/31/2023   BUN 16 03/31/2023   NA 138 03/31/2023   K 4.3 03/31/2023   CL 103 03/31/2023   CO2 24 03/31/2023   Lab Results  Component Value Date   ALT 21 03/31/2023   AST 22 03/31/2023   ALKPHOS 63 03/31/2023   BILITOT 0.3 03/31/2023   Lab Results  Component Value Date   HGBA1C 5.2 04/14/2023   HGBA1C 5.2 11/17/2022   HGBA1C 5.2 06/29/2022   HGBA1C 5.2 11/19/2021   HGBA1C 5.2 07/09/2021   Lab Results  Component Value Date   INSULIN  2.9 04/14/2023   INSULIN  28.7 (H) 11/19/2021   INSULIN  8.6 07/09/2021   INSULIN  19.2 04/15/2021   INSULIN  7.8 08/22/2020   Lab Results  Component Value Date   TSH 1.210 03/31/2023   Lab Results  Component Value Date   CHOL 144 03/31/2023   HDL 72 03/31/2023   LDLCALC 61 03/31/2023   TRIG 52 03/31/2023   CHOLHDL 2.0 03/31/2023   Lab Results  Component Value Date   VD25OH 63.4 04/14/2023   VD25OH 60.6 11/17/2022   VD25OH 60.0 06/29/2022   Lab Results  Component Value  Date   WBC 5.3 06/03/2023   HGB 13.2 06/03/2023   HCT 39.4 06/03/2023   MCV 92.7 06/03/2023   PLT 237 06/03/2023   Lab Results  Component Value Date   IRON  66 06/03/2023   TIBC 340 06/03/2023   FERRITIN 116 06/03/2023   Attestation Statements:   Reviewed by clinician on day of visit: allergies, medications, problem list, medical history, surgical history, family history, social history, and previous encounter notes.  I have reviewed the above documentation for accuracy and completeness, and I agree with the above. -  Bettyjane Shenoy d. Mecca Barga, NP-C

## 2023-06-30 ENCOUNTER — Other Ambulatory Visit: Payer: Self-pay

## 2023-06-30 ENCOUNTER — Other Ambulatory Visit (HOSPITAL_COMMUNITY): Payer: Self-pay

## 2023-07-15 ENCOUNTER — Other Ambulatory Visit: Payer: Self-pay

## 2023-07-15 ENCOUNTER — Other Ambulatory Visit: Payer: Self-pay | Admitting: Family Medicine

## 2023-07-15 MED ORDER — LOSARTAN POTASSIUM 25 MG PO TABS
25.0000 mg | ORAL_TABLET | Freq: Every day | ORAL | 3 refills | Status: DC
  Filled 2023-07-15: qty 30, 30d supply, fill #0
  Filled 2023-08-16: qty 30, 30d supply, fill #1
  Filled 2023-09-15: qty 30, 30d supply, fill #2
  Filled 2023-10-11: qty 30, 30d supply, fill #3

## 2023-07-18 ENCOUNTER — Other Ambulatory Visit (HOSPITAL_COMMUNITY): Payer: Self-pay

## 2023-07-19 ENCOUNTER — Other Ambulatory Visit (HOSPITAL_COMMUNITY): Payer: Self-pay

## 2023-07-19 ENCOUNTER — Other Ambulatory Visit: Payer: Self-pay

## 2023-07-20 ENCOUNTER — Other Ambulatory Visit (HOSPITAL_COMMUNITY): Payer: Self-pay

## 2023-07-20 ENCOUNTER — Other Ambulatory Visit: Payer: Self-pay

## 2023-07-20 ENCOUNTER — Encounter (HOSPITAL_COMMUNITY): Payer: Self-pay | Admitting: Hematology

## 2023-07-22 ENCOUNTER — Encounter (HOSPITAL_COMMUNITY): Payer: Self-pay | Admitting: Hematology

## 2023-07-22 ENCOUNTER — Ambulatory Visit: Payer: Commercial Managed Care - PPO | Admitting: Orthopedic Surgery

## 2023-07-22 ENCOUNTER — Other Ambulatory Visit (HOSPITAL_COMMUNITY): Payer: Self-pay

## 2023-07-22 ENCOUNTER — Other Ambulatory Visit: Payer: Self-pay

## 2023-07-22 DIAGNOSIS — M5414 Radiculopathy, thoracic region: Secondary | ICD-10-CM | POA: Diagnosis not present

## 2023-07-22 NOTE — Progress Notes (Signed)
Orthopedic Spine Surgery Office Note   Assessment: Patient is a 53 y.o. female with low back pain that radiates into her left anterior hip and anterior proximal thigh possible radiculopathy     Plan: -Patient has tried Tylenol, Aleve, Lyrica -Her radiating left thigh pain has resolved with Lyrica, so we will hold off on advanced imaging at this time -For her pain and numbness that goes from her thoracic spine to her anterior chest wall, told her that she can continue to take the Lyrica.  She has only had the symptoms for 2 weeks so we will continue to monitor -If she is not doing any better at her next visit we will order an MRI of the thoracic spine -Patient should return to office in 6 weeks, x-rays at next visit: thoracic AP and lateral if her symptoms are the same     Patient expressed understanding of the plan and all questions were answered to the patient's satisfaction.    ___________________________________________________________________________     History:   Patient is a 53 y.o. female who presents today for follow-up on her lumbar spine.  She says that after starting the Lyrica her pain that was radiating into her left thigh has resolved.  She is not having pain radiating to either lower extremity.  In the last 2 weeks, she has developed pain and decreased sensation that starts in her mid thoracic spine and goes into her anterior chest wall.  She feels it going around bilaterally.  She has not noticed any imbalance or unsteadiness.  No bowel or bladder incontinence.  No saddle anesthesia.  There is no trauma or injury that preceded the onset of the symptoms.   Treatments tried: Tylenol, Aleve, Lyrica     Physical Exam:   General: no acute distress, appears stated age Neurologic: alert, answering questions appropriately, following commands Respiratory: unlabored breathing on room air, symmetric chest rise Psychiatric: appropriate affect, normal cadence to speech     MSK  (spine):   -Strength exam                                                   Left                  Right EHL                              5/5                  5/5 TA                                 5/5                  5/5 GSC                             5/5                  5/5 Knee extension            5/5                  5/5 Hip flexion  5/5                  5/5   -Sensory exam                           Sensation intact to light touch in L3-S1 nerve distributions of bilateral lower extremities   -Achilles DTR: 1/4 on the left, 1/4 on the right -Patellar tendon DTR: 1/4 on the left, 1/4 on the right   -Straight leg raise: Negative bilaterally -Femoral nerve stretch test: Negative bilaterally -Clonus: no beats bilaterally -Negative heel-to-shin test -Negative Romberg -Normal gait   Imaging: XRs of the lumbar spine from 06/10/2023 were previously independently reviewed and interpreted, showing disc height loss at L3/4, L4/5, L5/S1.  Small anterior osteophyte formation seen at those levels as well.  No fracture or dislocation seen.  No evidence of instability on flexion/extension views.     Patient name: [name] Patient MRN: [id number] Date of visit: 07/22/23

## 2023-08-16 ENCOUNTER — Other Ambulatory Visit (HOSPITAL_COMMUNITY): Payer: Self-pay

## 2023-08-25 ENCOUNTER — Other Ambulatory Visit (HOSPITAL_COMMUNITY): Payer: Self-pay

## 2023-09-02 ENCOUNTER — Ambulatory Visit: Payer: Commercial Managed Care - PPO | Admitting: Orthopedic Surgery

## 2023-09-13 ENCOUNTER — Other Ambulatory Visit (HOSPITAL_COMMUNITY): Payer: Self-pay

## 2023-09-15 ENCOUNTER — Other Ambulatory Visit (HOSPITAL_COMMUNITY): Payer: Self-pay

## 2023-09-15 ENCOUNTER — Other Ambulatory Visit: Payer: Self-pay

## 2023-09-15 ENCOUNTER — Other Ambulatory Visit: Payer: Self-pay | Admitting: Family Medicine

## 2023-09-15 DIAGNOSIS — M5430 Sciatica, unspecified side: Secondary | ICD-10-CM

## 2023-09-15 MED ORDER — CYCLOBENZAPRINE HCL 10 MG PO TABS
10.0000 mg | ORAL_TABLET | Freq: Every day | ORAL | 2 refills | Status: AC
  Filled 2023-09-15: qty 90, 90d supply, fill #0
  Filled 2023-12-21: qty 90, 90d supply, fill #1
  Filled 2024-03-20: qty 90, 90d supply, fill #2

## 2023-09-21 ENCOUNTER — Other Ambulatory Visit (HOSPITAL_COMMUNITY): Payer: Self-pay

## 2023-09-21 ENCOUNTER — Other Ambulatory Visit: Payer: Self-pay

## 2023-09-21 ENCOUNTER — Encounter (INDEPENDENT_AMBULATORY_CARE_PROVIDER_SITE_OTHER): Payer: Self-pay | Admitting: Adult Health

## 2023-09-21 ENCOUNTER — Ambulatory Visit (INDEPENDENT_AMBULATORY_CARE_PROVIDER_SITE_OTHER): Payer: Commercial Managed Care - PPO | Admitting: Adult Health

## 2023-09-21 VITALS — BP 135/63 | Temp 97.8°F | Ht 71.0 in | Wt 155.0 lb

## 2023-09-21 DIAGNOSIS — Z6830 Body mass index (BMI) 30.0-30.9, adult: Secondary | ICD-10-CM

## 2023-09-21 DIAGNOSIS — I10 Essential (primary) hypertension: Secondary | ICD-10-CM

## 2023-09-21 DIAGNOSIS — E669 Obesity, unspecified: Secondary | ICD-10-CM | POA: Diagnosis not present

## 2023-09-21 DIAGNOSIS — E559 Vitamin D deficiency, unspecified: Secondary | ICD-10-CM

## 2023-09-21 DIAGNOSIS — E785 Hyperlipidemia, unspecified: Secondary | ICD-10-CM | POA: Diagnosis not present

## 2023-09-21 DIAGNOSIS — Z7985 Long-term (current) use of injectable non-insulin antidiabetic drugs: Secondary | ICD-10-CM

## 2023-09-21 DIAGNOSIS — E1169 Type 2 diabetes mellitus with other specified complication: Secondary | ICD-10-CM

## 2023-09-21 DIAGNOSIS — Z6821 Body mass index (BMI) 21.0-21.9, adult: Secondary | ICD-10-CM

## 2023-09-21 MED ORDER — VITAMIN D (ERGOCALCIFEROL) 1.25 MG (50000 UNIT) PO CAPS
50000.0000 [IU] | ORAL_CAPSULE | ORAL | 0 refills | Status: DC
  Filled 2023-09-21 – 2023-10-14 (×2): qty 6, 84d supply, fill #0

## 2023-09-21 MED ORDER — SEMAGLUTIDE (1 MG/DOSE) 4 MG/3ML ~~LOC~~ SOPN
1.0000 mg | PEN_INJECTOR | SUBCUTANEOUS | 0 refills | Status: DC
  Filled 2023-09-21 – 2023-10-08 (×2): qty 9, 84d supply, fill #0

## 2023-09-21 NOTE — Progress Notes (Signed)
 WEIGHT SUMMARY AND BIOMETRICS  Vitals Temp: 97.8 F (36.6 C) BP: 135/63 SpO2: 98 %   Anthropometric Measurements Height: 5\' 11"  (1.803 m) Weight: 155 lb (70.3 kg) BMI (Calculated): 21.63 Weight at Last Visit: 152 lb Weight Lost Since Last Visit: 0 Weight Gained Since Last Visit: 3 lb Starting Weight: 205 lb Total Weight Loss (lbs): 50 lb (22.7 kg)   Body Composition  Body Fat %: 28.2 % Fat Mass (lbs): 43.8 lbs Muscle Mass (lbs): 106.2 lbs Total Body Water (lbs): 71.6 lbs Visceral Fat Rating : 5   Other Clinical Data Fasting: No Labs: No Today's Visit #: 25 Starting Date: 08/22/20    Chief Complaint:   OBESITY [name] is here to discuss her progress with her obesity treatment plan.  She is on the practicing portion control and making smarter food choices, such as increasing vegetables and decreasing simple carbohydrates and states she is following her eating plan approximately 98 % of the time.  She states she is exercising walking 30 minutes 5 times per week.   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 Primary hypertension BP stable at OV She denies tobacco/vape use She denies CP with exertion (ie: brisk walking) She is currently on rosuvastatin (CRESTOR) 10 MG tablet  losartan (COZAAR) 25 MG tablet   2. Hyperlipidemia associated with type 2 diabetes mellitus (HCC) Lipid Panel     Component Value Date/Time   CHOL 144 03/31/2023 0848   TRIG 52 03/31/2023 0848   HDL 72 03/31/2023 0848   CHOLHDL 2.0 03/31/2023 0848   CHOLHDL 2.5 03/15/2019 0756   VLDL 12 08/22/2016 0851   LDLCALC 61 03/31/2023 0848   LDLCALC 105 (H) 03/15/2019 0756    LABVLDL 11 03/31/2023 0848    She denies tobacco/vape use She denies CP with exertion (ie: brisk walking) She is currently on: rosuvastatin (CRESTOR) 10 MG tablet  losartan (COZAAR) 25 MG tablet   3. Type 2 diabetes mellitus with other specified complication, without long-term current use of insulin (HCC) She is currently on weekly Ozempic 1mg  She reports fasting CBG 80-90s She denies sx's of hypoglycemia She is on weekly Ozempic 1mg  Denies mass in neck, dysphagia, dyspepsia, persistent hoarseness, abdominal pain, or N/V/C  She denies appetite suppression- able to consume 3 meals with nightly snacks/day.  4. Vit D Def She endorses stable energy levels She is on Ergocalciferol Q14 days- denies N/V/Muscle Weakness  Assessment/Plan:   1. Primary hypertension Continue regular exercise Continue rosuvastatin (CRESTOR) 10 MG tablet  losartan (COZAAR) 25 MG tablet   2. Hyperlipidemia associated with type 2 diabetes mellitus (HCC) Continue regular exercise Continue rosuvastatin (CRESTOR) 10 MG tablet  losartan (COZAAR) 25 MG tablet   3. Type 2 diabetes mellitus with other specified complication, without long-term current use of insulin (HCC) Refill  Semaglutide, 1 MG/DOSE, 4 MG/3ML SOPN Inject 1 mg into the skin once a week. Dispense: 9 mL, Refills: 0 of 0 remaining   Continue to check home fasting CBG and MAINTAIN WEIGHT  4. Vit D Def Refill  5. Obesity, current BMI 21.7 (Primary)  [name] is currently in the action stage of  change. As such, her goal is to maintain weight for now. She has agreed to practicing portion control and making smarter food choices, such as increasing vegetables and decreasing simple carbohydrates.   Exercise goals: For substantial health benefits, adults should do at least 150 minutes (2 hours and 30 minutes) a week of moderate-intensity, or 75 minutes (1 hour and 15 minutes) a week of vigorous-intensity aerobic physical activity, or an equivalent  combination of moderate- and vigorous-intensity aerobic activity. Aerobic activity should be performed in episodes of at least 10 minutes, and preferably, it should be spread throughout the week.  Behavioral modification strategies: increasing lean protein intake, decreasing simple carbohydrates, increasing vegetables, increasing water intake, no skipping meals, meal planning and cooking strategies, keeping healthy foods in the home, and planning for success.  [name] has agreed to follow-up with our clinic in 12 weeks. She was informed of the importance of frequent follow-up visits to maximize her success with intensive lifestyle modifications for her multiple health conditions.   Objective:   Blood pressure 135/63, temperature 97.8 F (36.6 C), height 5\' 11"  (1.803 m), weight 155 lb (70.3 kg), SpO2 98%. Body mass index is 21.62 kg/m.  General: Cooperative, alert, well developed, in no acute distress. HEENT: Conjunctivae and lids unremarkable. Cardiovascular: Regular rhythm.  Lungs: Normal work of breathing. Neurologic: No focal deficits.   Lab Results  Component Value Date   CREATININE 0.77 03/31/2023   BUN 16 03/31/2023   NA 138 03/31/2023   K 4.3 03/31/2023   CL 103 03/31/2023   CO2 24 03/31/2023   Lab Results  Component Value Date   ALT 21 03/31/2023   AST 22 03/31/2023   ALKPHOS 63 03/31/2023   BILITOT 0.3 03/31/2023   Lab Results  Component Value Date   HGBA1C 5.2 04/14/2023   HGBA1C 5.2 11/17/2022   HGBA1C 5.2 06/29/2022   HGBA1C 5.2 11/19/2021   HGBA1C 5.2 07/09/2021   Lab Results  Component Value Date   INSULIN 2.9 04/14/2023   INSULIN 28.7 (H) 11/19/2021   INSULIN 8.6 07/09/2021   INSULIN 19.2 04/15/2021   INSULIN 7.8 08/22/2020   Lab Results  Component Value Date   TSH 1.210 03/31/2023   Lab Results  Component Value Date   CHOL 144 03/31/2023   HDL 72 03/31/2023   LDLCALC 61 03/31/2023   TRIG 52 03/31/2023   CHOLHDL 2.0 03/31/2023   Lab  Results  Component Value Date   VD25OH 63.4 04/14/2023   VD25OH 60.6 11/17/2022   VD25OH 60.0 06/29/2022   Lab Results  Component Value Date   WBC 5.3 06/03/2023   HGB 13.2 06/03/2023   HCT 39.4 06/03/2023   MCV 92.7 06/03/2023   PLT 237 06/03/2023   Lab Results  Component Value Date   IRON 66 06/03/2023   TIBC 340 06/03/2023   FERRITIN 116 06/03/2023   Attestation Statements:   Reviewed by clinician on day of visit: allergies, medications, problem list, medical history, surgical history, family history, social history, and previous encounter notes.  I have reviewed the above documentation for accuracy and completeness, and I agree with the above. -  Saharra Santo d. Jamaurion Slemmer, NP-C

## 2023-10-06 ENCOUNTER — Ambulatory Visit: Payer: 59 | Admitting: Family Medicine

## 2023-10-09 ENCOUNTER — Other Ambulatory Visit (HOSPITAL_COMMUNITY): Payer: Self-pay

## 2023-10-09 ENCOUNTER — Other Ambulatory Visit (HOSPITAL_BASED_OUTPATIENT_CLINIC_OR_DEPARTMENT_OTHER): Payer: Self-pay

## 2023-10-11 ENCOUNTER — Other Ambulatory Visit (HOSPITAL_COMMUNITY): Payer: Self-pay

## 2023-10-13 ENCOUNTER — Ambulatory Visit: Payer: 59 | Admitting: Family Medicine

## 2023-10-14 ENCOUNTER — Other Ambulatory Visit: Payer: Self-pay

## 2023-10-22 ENCOUNTER — Other Ambulatory Visit (HOSPITAL_COMMUNITY): Payer: Self-pay | Admitting: Family Medicine

## 2023-10-22 DIAGNOSIS — Z1231 Encounter for screening mammogram for malignant neoplasm of breast: Secondary | ICD-10-CM

## 2023-10-28 ENCOUNTER — Other Ambulatory Visit (HOSPITAL_COMMUNITY): Payer: Self-pay

## 2023-10-28 ENCOUNTER — Ambulatory Visit (INDEPENDENT_AMBULATORY_CARE_PROVIDER_SITE_OTHER): Admitting: Adult Health

## 2023-10-28 ENCOUNTER — Encounter (INDEPENDENT_AMBULATORY_CARE_PROVIDER_SITE_OTHER): Payer: Self-pay | Admitting: Adult Health

## 2023-10-28 VITALS — BP 119/74 | HR 84 | Temp 98.0°F | Ht 71.0 in | Wt 153.0 lb

## 2023-10-28 DIAGNOSIS — Z6821 Body mass index (BMI) 21.0-21.9, adult: Secondary | ICD-10-CM | POA: Diagnosis not present

## 2023-10-28 DIAGNOSIS — E559 Vitamin D deficiency, unspecified: Secondary | ICD-10-CM

## 2023-10-28 DIAGNOSIS — E669 Obesity, unspecified: Secondary | ICD-10-CM

## 2023-10-28 DIAGNOSIS — E1169 Type 2 diabetes mellitus with other specified complication: Secondary | ICD-10-CM | POA: Diagnosis not present

## 2023-10-28 DIAGNOSIS — Z7985 Long-term (current) use of injectable non-insulin antidiabetic drugs: Secondary | ICD-10-CM

## 2023-10-28 DIAGNOSIS — E785 Hyperlipidemia, unspecified: Secondary | ICD-10-CM

## 2023-10-28 DIAGNOSIS — I10 Essential (primary) hypertension: Secondary | ICD-10-CM | POA: Diagnosis not present

## 2023-10-28 DIAGNOSIS — E66811 Obesity, class 1: Secondary | ICD-10-CM

## 2023-10-28 MED ORDER — SEMAGLUTIDE (1 MG/DOSE) 4 MG/3ML ~~LOC~~ SOPN
1.0000 mg | PEN_INJECTOR | SUBCUTANEOUS | 0 refills | Status: DC
  Filled 2023-10-28: qty 9, 84d supply, fill #0

## 2023-10-28 MED ORDER — VITAMIN D (ERGOCALCIFEROL) 1.25 MG (50000 UNIT) PO CAPS
50000.0000 [IU] | ORAL_CAPSULE | ORAL | 0 refills | Status: DC
  Filled 2023-10-28: qty 6, 84d supply, fill #0

## 2023-10-28 NOTE — Progress Notes (Addendum)
 WEIGHT SUMMARY AND BIOMETRICS  Vitals Temp: 98 F (36.7 C) BP: 119/74 Pulse Rate: 84 SpO2: 96 %  Weight 153 lbs   Anthropometric Measurements Height: 5\' 11"  (1.803 m) Weight at Last Visit: 155 lb Starting Weight: 205 lb   No data recorded Other Clinical Data Fasting: no Labs: no Today's Visit #: 26 Starting Date: 08/22/20    Chief Complaint:   OBESITY [name] is here to discuss her progress with her obesity treatment plan.  She is on the practicing portion control and making smarter food choices, such as increasing vegetables and decreasing simple carbohydrates and states she is following her eating plan approximately 95 % of the time.  She states she is exercising: NEAT Activities  Interim History:  [name] provided the following food recall that is typical of a day: Breakfast: Liquid eggs, oatmeal, and either sausage or bacon Lunch: Meat sandwich, cottage cheese, and fruit (strawberries/raspberries) Dinner: Chicken with vegetables Snacks: Smart Muffins (99-110 cal with 9g protein), Rice Cakes with peanut butter  [name] is in Mx Phase- followed by [facility] Q12W She is RN at [employer] She is on weekly Ozempic  1mg - denies over suppression of appetite   Reviewed Bioimpedance results with pt: Muscle Mass: No Change Adipose Mass: - 2.2  lbs  Subjective:   1. Primary hypertension BP excellent and at goal at OV She is on rosuvastatin  (CRESTOR ) 10 MG tablet  losartan  (COZAAR ) 25 MG tablet   2. Hyperlipidemia associated with type 2 diabetes mellitus (HCC) Lipid Panel     Latest Reference Range & Units 03/31/23 08:48  Total CHOL/HDL Ratio 0.0 - 4.4 ratio 2.0  Cholesterol, Total 100 - 199 mg/dL 528  HDL Cholesterol >41 mg/dL 72  Triglycerides 0 - 324 mg/dL 52  VLDL Cholesterol Cal 5 - 40 mg/dL 11  LDL Chol Calc (NIH) 0 - 99 mg/dL 61   PCP manages daily Crestor  20mg  LDL at goal for a diabetic  3. Vitamin D  deficiency  Latest Reference  Range & Units 04/14/23 09:33  Vitamin D , 25-Hydroxy 30.0 - 100.0 ng/mL 63.4   She endorse stable energy levels She in on Ergocalciferol - every 14 days  4. Type 2 diabetes mellitus with other specified complication, without long-term current use of insulin  [facility]) Lab Results  Component Value Date   HGBA1C 5.2 04/14/2023   HGBA1C 5.2 11/17/2022   HGBA1C 5.2 06/29/2022    She infrequently checks home CBG, denies sx's of hypoglycemia Last fasting level she checked was in low 100s  Assessment/Plan:   1. Primary hypertension (Primary) Check Labs at next OV Limit Na+ Continue rosuvastatin  (CRESTOR ) 10 MG tablet  losartan  (COZAAR ) 25 MG tablet   2. Hyperlipidemia associated with type 2 diabetes mellitus (HCC) Check Labs at next OV  3. Vitamin D  deficiency Refill Vitamin D , Ergocalciferol , (DRISDOL ) 1.25 MG (50000 UNIT) CAPS capsule Take 1 capsule (50,000 Units total) by mouth every 14 (fourteen) days. Dispense: 6 capsule, Refills: 0 ordered   4. Type 2 diabetes mellitus with other specified complication, without long-term current use of insulin  (HCC) Check Labs at next OV Refill - Semaglutide , 1 MG/DOSE, 4 MG/3ML SOPN; Inject 1 mg into the skin once a week.  Dispense: 9 mL; Refill: 0  5. Obesity, current BMI 21.4  [name] is currently in the action stage of change. As such, her goal is to continue with weight loss efforts. She has agreed to practicing portion control and making smarter food choices, such as increasing vegetables and  decreasing simple carbohydrates.   Exercise goals: All adults should avoid inactivity. Some physical activity is better than none, and adults who participate in any amount of physical activity gain some health benefits. Adults should also include muscle-strengthening activities that involve all major muscle groups on 2 or more days a week. Walk 2 x week for 20 mins  Behavioral modification strategies: increasing lean protein intake, decreasing simple  carbohydrates, increasing vegetables, increasing water  intake, no skipping meals, meal planning and cooking strategies, keeping healthy foods in the home, ways to avoid boredom eating, and planning for success.  [name] has agreed to follow-up with our clinic in 4 weeks. She was informed of the importance of frequent follow-up visits to maximize her success with intensive lifestyle modifications for her multiple health conditions.   Check Fasting Labs at next OV  Objective:   Blood pressure 119/74, pulse 84, temperature 98 F (36.7 C), height 5\' 11"  (1.803 m), weight (P) 153 lb (69.4 kg), SpO2 96%. Body mass index is 21.34 kg/m (pended).  General: Cooperative, alert, well developed, in no acute distress. HEENT: Conjunctivae and lids unremarkable. Cardiovascular: Regular rhythm.  Lungs: Normal work of breathing. Neurologic: No focal deficits.   Lab Results  Component Value Date   CREATININE 0.77 03/31/2023   BUN 16 03/31/2023   NA 138 03/31/2023   K 4.3 03/31/2023   CL 103 03/31/2023   CO2 24 03/31/2023   Lab Results  Component Value Date   ALT 21 03/31/2023   AST 22 03/31/2023   ALKPHOS 63 03/31/2023   BILITOT 0.3 03/31/2023   Lab Results  Component Value Date   HGBA1C 5.2 04/14/2023   HGBA1C 5.2 11/17/2022   HGBA1C 5.2 06/29/2022   HGBA1C 5.2 11/19/2021   HGBA1C 5.2 07/09/2021   Lab Results  Component Value Date   INSULIN  2.9 04/14/2023   INSULIN  28.7 (H) 11/19/2021   INSULIN  8.6 07/09/2021   INSULIN  19.2 04/15/2021   INSULIN  7.8 08/22/2020   Lab Results  Component Value Date   TSH 1.210 03/31/2023   Lab Results  Component Value Date   CHOL 144 03/31/2023   HDL 72 03/31/2023   LDLCALC 61 03/31/2023   TRIG 52 03/31/2023   CHOLHDL 2.0 03/31/2023   Lab Results  Component Value Date   VD25OH 63.4 04/14/2023   VD25OH 60.6 11/17/2022   VD25OH 60.0 06/29/2022   Lab Results  Component Value Date   WBC 5.3 06/03/2023   HGB 13.2 06/03/2023   HCT 39.4  06/03/2023   MCV 92.7 06/03/2023   PLT 237 06/03/2023   Lab Results  Component Value Date   IRON  66 06/03/2023   TIBC 340 06/03/2023   FERRITIN 116 06/03/2023   Attestation Statements:   Reviewed by clinician on day of visit: allergies, medications, problem list, medical history, surgical history, family history, social history, and previous encounter notes.  I have reviewed the above documentation for accuracy and completeness, and I agree with the above. -  Hezakiah Champeau d. Cache Decoursey, NP-C

## 2023-11-05 ENCOUNTER — Other Ambulatory Visit: Payer: Self-pay

## 2023-11-05 DIAGNOSIS — E559 Vitamin D deficiency, unspecified: Secondary | ICD-10-CM

## 2023-11-05 DIAGNOSIS — E785 Hyperlipidemia, unspecified: Secondary | ICD-10-CM

## 2023-11-05 DIAGNOSIS — I10 Essential (primary) hypertension: Secondary | ICD-10-CM

## 2023-11-06 LAB — CMP14+EGFR
ALT: 38 IU/L — ABNORMAL HIGH (ref 0–32)
AST: 33 IU/L (ref 0–40)
Albumin: 4.6 g/dL (ref 3.8–4.9)
Alkaline Phosphatase: 65 IU/L (ref 44–121)
BUN/Creatinine Ratio: 21 (ref 9–23)
BUN: 18 mg/dL (ref 6–24)
Bilirubin Total: 0.2 mg/dL (ref 0.0–1.2)
CO2: 24 mmol/L (ref 20–29)
Calcium: 9.8 mg/dL (ref 8.7–10.2)
Chloride: 100 mmol/L (ref 96–106)
Creatinine, Ser: 0.85 mg/dL (ref 0.57–1.00)
Globulin, Total: 2.8 g/dL (ref 1.5–4.5)
Glucose: 62 mg/dL — ABNORMAL LOW (ref 70–99)
Potassium: 3.8 mmol/L (ref 3.5–5.2)
Sodium: 140 mmol/L (ref 134–144)
Total Protein: 7.4 g/dL (ref 6.0–8.5)
eGFR: 82 mL/min/{1.73_m2} (ref >59)

## 2023-11-06 LAB — LIPID PANEL
Chol/HDL Ratio: 2 ratio (ref 0.0–4.4)
Cholesterol, Total: 149 mg/dL (ref 100–199)
HDL: 74 mg/dL (ref >39)
LDL Chol Calc (NIH): 65 mg/dL (ref 0–99)
Triglycerides: 46 mg/dL (ref 0–149)
VLDL Cholesterol Cal: 10 mg/dL (ref 5–40)

## 2023-11-06 LAB — VITAMIN D 25 HYDROXY (VIT D DEFICIENCY, FRACTURES): Vit D, 25-Hydroxy: 78.4 ng/mL (ref 30.0–100.0)

## 2023-11-08 ENCOUNTER — Other Ambulatory Visit (HOSPITAL_COMMUNITY): Payer: Self-pay

## 2023-11-08 ENCOUNTER — Other Ambulatory Visit: Payer: Self-pay

## 2023-11-08 ENCOUNTER — Other Ambulatory Visit: Payer: Self-pay | Admitting: Family Medicine

## 2023-11-08 MED ORDER — LOSARTAN POTASSIUM 25 MG PO TABS
25.0000 mg | ORAL_TABLET | Freq: Every day | ORAL | 3 refills | Status: DC
  Filled 2023-11-08: qty 30, 30d supply, fill #0
  Filled 2023-12-11: qty 30, 30d supply, fill #1
  Filled 2024-01-07: qty 30, 30d supply, fill #2
  Filled 2024-02-17: qty 30, 30d supply, fill #3

## 2023-11-12 ENCOUNTER — Ambulatory Visit: Admitting: Family Medicine

## 2023-11-12 VITALS — BP 132/69 | HR 63 | Temp 97.9°F | Ht 71.0 in | Wt 157.1 lb

## 2023-11-12 DIAGNOSIS — H6123 Impacted cerumen, bilateral: Secondary | ICD-10-CM | POA: Diagnosis not present

## 2023-11-12 DIAGNOSIS — E785 Hyperlipidemia, unspecified: Secondary | ICD-10-CM | POA: Diagnosis not present

## 2023-11-12 DIAGNOSIS — E559 Vitamin D deficiency, unspecified: Secondary | ICD-10-CM | POA: Diagnosis not present

## 2023-11-12 DIAGNOSIS — M5430 Sciatica, unspecified side: Secondary | ICD-10-CM

## 2023-11-12 DIAGNOSIS — M549 Dorsalgia, unspecified: Secondary | ICD-10-CM

## 2023-11-12 DIAGNOSIS — I10 Essential (primary) hypertension: Secondary | ICD-10-CM | POA: Diagnosis not present

## 2023-11-12 DIAGNOSIS — N951 Menopausal and female climacteric states: Secondary | ICD-10-CM

## 2023-11-12 NOTE — Assessment & Plan Note (Signed)
 Hyperlipidemia:Low fat diet discussed and encouraged.   Lipid Panel  Lab Results  Component Value Date   CHOL 149 11/05/2023   HDL 74 11/05/2023   LDLCALC 65 11/05/2023   TRIG 46 11/05/2023   CHOLHDL 2.0 11/05/2023   Stop crestor , elevated LFT and diet is low fat

## 2023-11-12 NOTE — Assessment & Plan Note (Signed)
 Controlled, no change in medication DASH diet and commitment to daily physical activity for a minimum of 30 minutes discussed and encouraged, as a part of hypertension management. The importance of attaining a healthy weight is also discussed.     11/12/2023    4:17 PM 10/28/2023    9:00 AM 09/21/2023    8:00 AM 06/29/2023    3:11 PM 06/29/2023    3:00 PM 06/10/2023    3:03 PM 06/10/2023    8:56 AM  BP/Weight  Systolic BP 132 119 135 145 152 136 128  Diastolic BP 69 74 63 79 71 76 65  Wt. (Lbs) 157.08 153 155  152 153 155.2  BMI 21.91 kg/m2 21.34 kg/m2 21.62 kg/m2  21.2 kg/m2 21.34 kg/m2 21.65 kg/m2

## 2023-11-12 NOTE — Patient Instructions (Addendum)
 Annual exam 10/17 or after , call if you need me sooner  Stop crestor   Reduce tylenol  as discussed  Fasting labs 3 to 5 days before next appt, CBC,lipid, cmp and eGFR and TSH  Cocnut oil is good option  Wax softener twice daily to each ear for 3 days the use Knuckle to get out wax, call for ear flush appt if not successful  It is important that you exercise regularly at least 30 minutes 5 times a week. If you develop chest pain, have severe difficulty breathing, or feel very tired, stop exercising immediately and seek medical attention  Thanks for choosing [facility], we consider it a privelige to serve you.

## 2023-11-14 ENCOUNTER — Encounter: Payer: Self-pay | Admitting: Family Medicine

## 2023-11-14 DIAGNOSIS — H6123 Impacted cerumen, bilateral: Secondary | ICD-10-CM | POA: Insufficient documentation

## 2023-11-14 NOTE — Assessment & Plan Note (Signed)
 Controlled, no change in medication

## 2023-11-14 NOTE — Progress Notes (Signed)
 [name]     MRN: [id number]      DOB: 09-Jan-1971  Chief Complaint  Patient presents with   Follow-up    6 Months follow up    Ear Fullness    Right ear feels like it gets stopped up    HPI [name] is here for follow up and re-evaluation of chronic medical conditions, medication management and review of any available recent lab and radiology data.  Preventive health is updated, specifically  Cancer screening and Immunization.   Questions or concerns regarding consultations or procedures which the PT has had in the interim are  addressed. The PT denies any adverse reactions to current medications since the last visit.     ROS Denies recent fever or chills. Denies sinus pressure, nasal congestion,  or sore throat. Denies chest congestion, productive cough or wheezing. Denies chest pains, palpitations and leg swelling Denies abdominal pain, nausea, vomiting,diarrhea or constipation.   Denies dysuria, frequency, hesitancy or incontinence. Denies joint pain, swelling and limitation in mobility. Denies headaches, seizures, numbness, or tingling. Denies depression, anxiety or insomnia. Denies skin break down or rash.   PE  BP 132/69   Pulse 63   Temp 97.9 F (36.6 C) (Oral)   Ht 5\' 11"  (1.803 m)   Wt 157 lb 1.3 oz (71.3 kg)   LMP 09/20/2023   SpO2 98%   BMI 21.91 kg/m   Patient alert and oriented and in no cardiopulmonary distress.  HEENT: No facial asymmetry, EOMI,     Neck supple .bilateral cerumen impaction, right greater than left  Chest: Clear to auscultation bilaterally.  CVS: S1, S2 no murmurs, no S3.Regular rate.  ABD: Soft non tender.   Ext: No edema  MS: Adequate ROM spine, shoulders, hips and knees.  Skin: Intact, no ulcerations or rash noted.  Psych: Good eye contact, normal affect. Memory intact not anxious or depressed appearing.  CNS: CN 2-12 intact, power,  normal throughout.no focal deficits noted.   Assessment &  Plan Hyperlipidemia with target low density lipoprotein (LDL) cholesterol less than 100 mg/dL Hyperlipidemia:Low fat diet discussed and encouraged.   Lipid Panel  Lab Results  Component Value Date   CHOL 149 11/05/2023   HDL 74 11/05/2023   LDLCALC 65 11/05/2023   TRIG 46 11/05/2023   CHOLHDL 2.0 11/05/2023   Stop crestor , elevated LFT and diet is low fat    Hypertension Controlled, no change in medication DASH diet and commitment to daily physical activity for a minimum of 30 minutes discussed and encouraged, as a part of hypertension management. The importance of attaining a healthy weight is also discussed.     11/12/2023    4:17 PM 10/28/2023    9:00 AM 09/21/2023    8:00 AM 06/29/2023    3:11 PM 06/29/2023    3:00 PM 06/10/2023    3:03 PM 06/10/2023    8:56 AM  BP/Weight  Systolic BP 132 119 135 145 152 136 128  Diastolic BP 69 74 63 79 71 76 65  Wt. (Lbs) 157.08 153 155  152 153 155.2  BMI 21.91 kg/m2 21.34 kg/m2 21.62 kg/m2  21.2 kg/m2 21.34 kg/m2 21.65 kg/m2       Vitamin D  deficiency Controlled, no change in medication   Back pain with sciatica Controlled, no change in medication   Bilateral impacted cerumen Attempt to remove wax with curette minimally successful, she is to use wax softener for  days and attempt to remove at home ,  if still symptomatic come in for flush, call for appt  Peri-menopause Reports vaginal dryness, not wanting estrogen recommend coconut oil

## 2023-11-14 NOTE — Assessment & Plan Note (Signed)
 Attempt to remove wax with curette minimally successful, she is to use wax softener for  days and attempt to remove at home , if still symptomatic come in for flush, call for appt

## 2023-11-14 NOTE — Assessment & Plan Note (Deleted)
 Controlled, no change in medication DASH diet and commitment to daily physical activity for a minimum of 30 minutes discussed and encouraged, as a part of hypertension management. The importance of attaining a healthy weight is also discussed.     11/12/2023    4:17 PM 10/28/2023    9:00 AM 09/21/2023    8:00 AM 06/29/2023    3:11 PM 06/29/2023    3:00 PM 06/10/2023    3:03 PM 06/10/2023    8:56 AM  BP/Weight  Systolic BP 132 119 135 145 152 136 128  Diastolic BP 69 74 63 79 71 76 65  Wt. (Lbs) 157.08 153 155  152 153 155.2  BMI 21.91 kg/m2 21.34 kg/m2 21.62 kg/m2  21.2 kg/m2 21.34 kg/m2 21.65 kg/m2

## 2023-11-14 NOTE — Assessment & Plan Note (Addendum)
 Reports vaginal dryness, not wanting estrogen recommend coconut oil

## 2023-11-16 ENCOUNTER — Other Ambulatory Visit: Payer: Self-pay

## 2023-11-16 DIAGNOSIS — E785 Hyperlipidemia, unspecified: Secondary | ICD-10-CM

## 2023-11-16 DIAGNOSIS — D500 Iron deficiency anemia secondary to blood loss (chronic): Secondary | ICD-10-CM

## 2023-11-16 DIAGNOSIS — I10 Essential (primary) hypertension: Secondary | ICD-10-CM

## 2023-11-22 ENCOUNTER — Other Ambulatory Visit: Payer: Self-pay | Admitting: Family Medicine

## 2023-11-22 ENCOUNTER — Other Ambulatory Visit: Payer: Self-pay

## 2023-11-22 ENCOUNTER — Other Ambulatory Visit (HOSPITAL_COMMUNITY): Payer: Self-pay

## 2023-11-22 MED ORDER — FAMOTIDINE 40 MG PO TABS
40.0000 mg | ORAL_TABLET | Freq: Every day | ORAL | 1 refills | Status: DC
  Filled 2023-11-22: qty 90, 90d supply, fill #0
  Filled 2024-02-17: qty 90, 90d supply, fill #1

## 2023-12-11 ENCOUNTER — Other Ambulatory Visit (HOSPITAL_COMMUNITY): Payer: Self-pay

## 2023-12-15 ENCOUNTER — Ambulatory Visit (HOSPITAL_COMMUNITY)
Admission: RE | Admit: 2023-12-15 | Discharge: 2023-12-15 | Disposition: A | Discharge status: Home / Self Care | Source: Ambulatory Visit | Attending: Family Medicine | Admitting: Family Medicine

## 2023-12-15 ENCOUNTER — Other Ambulatory Visit (HOSPITAL_COMMUNITY): Payer: Self-pay

## 2023-12-15 ENCOUNTER — Encounter (INDEPENDENT_AMBULATORY_CARE_PROVIDER_SITE_OTHER): Payer: Self-pay | Admitting: Adult Health

## 2023-12-15 ENCOUNTER — Ambulatory Visit (INDEPENDENT_AMBULATORY_CARE_PROVIDER_SITE_OTHER): Admitting: Adult Health

## 2023-12-15 ENCOUNTER — Other Ambulatory Visit: Payer: Self-pay

## 2023-12-15 VITALS — BP 128/77 | HR 63 | Temp 97.9°F | Ht 71.0 in | Wt 153.0 lb

## 2023-12-15 DIAGNOSIS — R7401 Elevation of levels of liver transaminase levels: Secondary | ICD-10-CM | POA: Diagnosis not present

## 2023-12-15 DIAGNOSIS — Z6821 Body mass index (BMI) 21.0-21.9, adult: Secondary | ICD-10-CM | POA: Diagnosis not present

## 2023-12-15 DIAGNOSIS — E1159 Type 2 diabetes mellitus with other circulatory complications: Secondary | ICD-10-CM

## 2023-12-15 DIAGNOSIS — Z6830 Body mass index (BMI) 30.0-30.9, adult: Secondary | ICD-10-CM

## 2023-12-15 DIAGNOSIS — E1169 Type 2 diabetes mellitus with other specified complication: Secondary | ICD-10-CM

## 2023-12-15 DIAGNOSIS — E559 Vitamin D deficiency, unspecified: Secondary | ICD-10-CM | POA: Diagnosis not present

## 2023-12-15 DIAGNOSIS — N951 Menopausal and female climacteric states: Secondary | ICD-10-CM | POA: Diagnosis not present

## 2023-12-15 DIAGNOSIS — Z7985 Long-term (current) use of injectable non-insulin antidiabetic drugs: Secondary | ICD-10-CM

## 2023-12-15 DIAGNOSIS — Z1231 Encounter for screening mammogram for malignant neoplasm of breast: Secondary | ICD-10-CM | POA: Diagnosis not present

## 2023-12-15 DIAGNOSIS — I152 Hypertension secondary to endocrine disorders: Secondary | ICD-10-CM | POA: Diagnosis not present

## 2023-12-15 MED ORDER — SEMAGLUTIDE (1 MG/DOSE) 4 MG/3ML ~~LOC~~ SOPN
1.0000 mg | PEN_INJECTOR | SUBCUTANEOUS | 0 refills | Status: DC
  Filled 2023-12-15 – 2024-01-02 (×2): qty 9, 84d supply, fill #0

## 2023-12-15 MED ORDER — VITAMIN D (ERGOCALCIFEROL) 1.25 MG (50000 UNIT) PO CAPS
50000.0000 [IU] | ORAL_CAPSULE | ORAL | 0 refills | Status: DC
  Filled 2023-12-15 – 2024-01-07 (×2): qty 6, 84d supply, fill #0

## 2023-12-15 NOTE — Progress Notes (Signed)
 WEIGHT SUMMARY AND BIOMETRICS  Vitals Temp: 97.9 F (36.6 C) BP: 128/77 Pulse Rate: 63 SpO2: 99 %   Anthropometric Measurements Height: 5' 11 (1.803 m) Weight: 153 lb (69.4 kg) BMI (Calculated): 21.35 Weight at Last Visit: 153lb Weight Lost Since Last Visit: 0lb Weight Gained Since Last Visit: 0lb Starting Weight: 205lb Total Weight Loss (lbs): 52 lb (23.6 kg)   Body Composition  Body Fat %: 27.4 % Fat Mass (lbs): 42 lbs Muscle Mass (lbs): 105.6 lbs Total Body Water  (lbs): 70.4 lbs Visceral Fat Rating : 5   Other Clinical Data Fasting: Yes Labs: Yes Today's Visit #: 60 Starting Date: 08/22/20    Chief Complaint:   OBESITY [name] is here to discuss her progress with her obesity treatment plan.  She is on the practicing portion control and making smarter food choices, such as increasing vegetables and decreasing simple carbohydrates and states she is following her eating plan approximately 97 % of the time.  She states she is exercising Walking 60 minutes 3 times per week.  Interim History:  Mx Phase at [facility] since Summer 2023  11/12/2023 OV with PCP- Dr. Antonetta stopped statin due to slightly elevated ALT She denies RUQ pain She was taking Acetaminophen  650mg  every night. She has since reduced to 1/2 tab at bedtime  She continues to eat healthy and remain active!  Subjective:   1. Elevated ALT measurement 11/12/2023 OV with PCP- Dr. Antonetta stopped statin due to slightly elevated ALT She denies RUQ pain She was taking Acetaminophen  650mg  every night. She has since reduced to 1/2 tab at bedtime  US  Abdomen Limited RUQ 01/18/2017 Narrative & Impression  CLINICAL DATA:  Five day history of upper abdominal pain   EXAM: ULTRASOUND ABDOMEN LIMITED RIGHT UPPER QUADRANT   COMPARISON:  CT abdomen and pelvis September 08, 2005   FINDINGS: Gallbladder:   No gallstones or wall thickening visualized. There is no pericholecystic fluid. No sonographic  Murphy sign noted by sonographer.   Common bile duct:   Diameter: 5 mm. No intrahepatic or extrahepatic biliary duct dilatation.   Liver:   No focal lesion identified. Within normal limits in parenchymal echogenicity.   IMPRESSION: Study within normal limits.   2. Hypertension associated with diabetes (HCC) BP at goal at ov  3. Type 2 diabetes mellitus with other specified complication, without long-term current use of insulin  [facility])   Lab Results  Component Value Date   HGBA1C 5.2 04/14/2023   HGBA1C 5.2 11/17/2022   HGBA1C 5.2 06/29/2022    She is currently on weekly Ozempic  1mg  She reports fasting CBG 100-120s She denies sx's of hypoglycemia  Denies mass in neck, dysphagia, dyspepsia, persistent hoarseness, abdominal pain, or N/V/C  She denies appetite suppression- able to consume 3 meals with nightly snacks/day.  4. Vitamin D  deficiency She is on bi-weekly Ergocalciferol - denies N/V/Muscle Weakness  5. Peri-menopause 11/12/2023 PCP OV Notes  Assessment/Plan:   1. Elevated ALT measurement Check CMP, forward results to PCP  2. Hypertension associated with diabetes (HCC) (Primary) Check Labs - Comprehensive metabolic panel with GFR  3. Type 2 diabetes mellitus with other specified complication, without long-term current use of insulin  (HCC) Check Labs and Refill - Semaglutide , 1 MG/DOSE, 4 MG/3ML SOPN; Inject 1 mg into the skin once a week.  Dispense: 9 mL; Refill: 0 - Hemoglobin A1c - Insulin , Free and Total  4. Vitamin D  deficiency Check Labs and Refill - Vitamin D , Ergocalciferol , (DRISDOL ) 1.25 MG (50000 UNIT) CAPS  capsule; Take 1 capsule (50,000 Units total) by mouth every 14 (fourteen) days.  Dispense: 6 capsule; Refill: 0 - VITAMIN D  25 Hydroxy (Vit-D Deficiency, Fractures)  5. Peri-menopause Remain well hydrated and continue healthy eating Continue OTC supplementation  6. Obesity, current BMI 21.4  [name] is currently in the action stage of  change. As such, her goal is to maintain weight for now. She has agreed to practicing portion control and making smarter food choices, such as increasing vegetables and decreasing simple carbohydrates.   Exercise goals: For substantial health benefits, adults should do at least 150 minutes (2 hours and 30 minutes) a week of moderate-intensity, or 75 minutes (1 hour and 15 minutes) a week of vigorous-intensity aerobic physical activity, or an equivalent combination of moderate- and vigorous-intensity aerobic activity. Aerobic activity should be performed in episodes of at least 10 minutes, and preferably, it should be spread throughout the week.  Behavioral modification strategies: increasing lean protein intake, decreasing simple carbohydrates, increasing vegetables, increasing water  intake, no skipping meals, meal planning and cooking strategies, keeping healthy foods in the home, ways to avoid boredom eating, ways to avoid night time snacking, planning for success, and decreasing junk food.  [name] has agreed to follow-up with our clinic in 4 weeks. She was informed of the importance of frequent follow-up visits to maximize her success with intensive lifestyle modifications for her multiple health conditions.   [name] was informed we would discuss her lab results at her next visit unless there is a critical issue that needs to be addressed sooner. [name] agreed to keep her next visit at the agreed upon time to discuss these results.  Objective:   Blood pressure 128/77, pulse 63, temperature 97.9 F (36.6 C), height 5' 11 (1.803 m), weight 153 lb (69.4 kg), SpO2 99%. Body mass index is 21.34 kg/m.  General: Cooperative, alert, well developed, in no acute distress. HEENT: Conjunctivae and lids unremarkable. Cardiovascular: Regular rhythm.  Lungs: Normal work of breathing. Neurologic: No focal deficits.   Lab Results  Component Value Date   CREATININE 0.85 11/05/2023   BUN 18 11/05/2023    NA 140 11/05/2023   K 3.8 11/05/2023   CL 100 11/05/2023   CO2 24 11/05/2023   Lab Results  Component Value Date   ALT 38 (H) 11/05/2023   AST 33 11/05/2023   ALKPHOS 65 11/05/2023   BILITOT 0.2 11/05/2023   Lab Results  Component Value Date   HGBA1C 5.2 04/14/2023   HGBA1C 5.2 11/17/2022   HGBA1C 5.2 06/29/2022   HGBA1C 5.2 11/19/2021   HGBA1C 5.2 07/09/2021   Lab Results  Component Value Date   INSULIN  2.9 04/14/2023   INSULIN  28.7 (H) 11/19/2021   INSULIN  8.6 07/09/2021   INSULIN  19.2 04/15/2021   INSULIN  7.8 08/22/2020   Lab Results  Component Value Date   TSH 1.210 03/31/2023   Lab Results  Component Value Date   CHOL 149 11/05/2023   HDL 74 11/05/2023   LDLCALC 65 11/05/2023   TRIG 46 11/05/2023   CHOLHDL 2.0 11/05/2023   Lab Results  Component Value Date   VD25OH 78.4 11/05/2023   VD25OH 63.4 04/14/2023   VD25OH 60.6 11/17/2022   Lab Results  Component Value Date   WBC 5.3 06/03/2023   HGB 13.2 06/03/2023   HCT 39.4 06/03/2023   MCV 92.7 06/03/2023   PLT 237 06/03/2023   Lab Results  Component Value Date   IRON  66 06/03/2023   TIBC 340 06/03/2023  FERRITIN 116 06/03/2023   Attestation Statements:   Reviewed by clinician on day of visit: allergies, medications, problem list, medical history, surgical history, family history, social history, and previous encounter notes.  I have reviewed the above documentation for accuracy and completeness, and I agree with the above. -  Jabril Pursell d. Avalon Coppinger, NP-C

## 2023-12-16 ENCOUNTER — Encounter (INDEPENDENT_AMBULATORY_CARE_PROVIDER_SITE_OTHER): Payer: Self-pay | Admitting: Adult Health

## 2023-12-16 LAB — INSULIN, FREE AND TOTAL

## 2023-12-21 ENCOUNTER — Other Ambulatory Visit: Payer: Self-pay

## 2023-12-21 ENCOUNTER — Other Ambulatory Visit: Payer: Self-pay | Admitting: Family Medicine

## 2023-12-21 ENCOUNTER — Encounter: Payer: Self-pay | Admitting: Family Medicine

## 2023-12-21 ENCOUNTER — Other Ambulatory Visit (HOSPITAL_COMMUNITY): Payer: Self-pay

## 2023-12-21 MED ORDER — DICLOFENAC SODIUM 50 MG PO TBEC
DELAYED_RELEASE_TABLET | ORAL | 5 refills | Status: DC
  Filled 2023-12-21: qty 30, 30d supply, fill #0
  Filled 2024-01-22: qty 30, 30d supply, fill #1
  Filled 2024-03-03: qty 30, 30d supply, fill #2
  Filled 2024-03-29: qty 30, 30d supply, fill #3
  Filled 2024-04-27: qty 30, 30d supply, fill #4
  Filled 2024-05-29: qty 30, 30d supply, fill #5

## 2023-12-23 LAB — COMPREHENSIVE METABOLIC PANEL WITH GFR
ALT: 22 IU/L (ref 0–32)
AST: 22 IU/L (ref 0–40)
Albumin: 4.6 g/dL (ref 3.8–4.9)
Alkaline Phosphatase: 69 IU/L (ref 44–121)
BUN/Creatinine Ratio: 18 (ref 9–23)
BUN: 15 mg/dL (ref 6–24)
Bilirubin Total: 0.2 mg/dL (ref 0.0–1.2)
CO2: 21 mmol/L (ref 20–29)
Calcium: 9.8 mg/dL (ref 8.7–10.2)
Chloride: 100 mmol/L (ref 96–106)
Creatinine, Ser: 0.84 mg/dL (ref 0.57–1.00)
Globulin, Total: 2.6 g/dL (ref 1.5–4.5)
Glucose: 64 mg/dL — ABNORMAL LOW (ref 70–99)
Potassium: 4.1 mmol/L (ref 3.5–5.2)
Sodium: 140 mmol/L (ref 134–144)
Total Protein: 7.2 g/dL (ref 6.0–8.5)
eGFR: 83 mL/min/{1.73_m2} (ref >59)

## 2023-12-23 LAB — INSULIN, FREE AND TOTAL: Free Insulin: 2.2 uU/mL

## 2023-12-23 LAB — HEMOGLOBIN A1C
Est. average glucose Bld gHb Est-mCnc: 103 mg/dL
Hgb A1c MFr Bld: 5.2 % (ref 4.8–5.6)

## 2023-12-23 LAB — VITAMIN D 25 HYDROXY (VIT D DEFICIENCY, FRACTURES): Vit D, 25-Hydroxy: 63.3 ng/mL (ref 30.0–100.0)

## 2024-01-03 ENCOUNTER — Other Ambulatory Visit: Payer: Self-pay

## 2024-01-03 ENCOUNTER — Other Ambulatory Visit (HOSPITAL_COMMUNITY): Payer: Self-pay

## 2024-01-07 ENCOUNTER — Other Ambulatory Visit (HOSPITAL_COMMUNITY): Payer: Self-pay

## 2024-01-22 ENCOUNTER — Other Ambulatory Visit: Payer: Self-pay

## 2024-02-15 ENCOUNTER — Other Ambulatory Visit (HOSPITAL_COMMUNITY): Payer: Self-pay

## 2024-02-17 ENCOUNTER — Other Ambulatory Visit (HOSPITAL_COMMUNITY): Payer: Self-pay

## 2024-02-28 DIAGNOSIS — H5203 Hypermetropia, bilateral: Secondary | ICD-10-CM | POA: Diagnosis not present

## 2024-02-28 DIAGNOSIS — H524 Presbyopia: Secondary | ICD-10-CM | POA: Diagnosis not present

## 2024-02-28 DIAGNOSIS — H52223 Regular astigmatism, bilateral: Secondary | ICD-10-CM | POA: Diagnosis not present

## 2024-03-03 ENCOUNTER — Other Ambulatory Visit (HOSPITAL_COMMUNITY): Payer: Self-pay

## 2024-03-08 ENCOUNTER — Ambulatory Visit (INDEPENDENT_AMBULATORY_CARE_PROVIDER_SITE_OTHER): Admitting: Adult Health

## 2024-03-08 ENCOUNTER — Other Ambulatory Visit (HOSPITAL_COMMUNITY): Payer: Self-pay

## 2024-03-08 ENCOUNTER — Encounter (INDEPENDENT_AMBULATORY_CARE_PROVIDER_SITE_OTHER): Payer: Self-pay | Admitting: Adult Health

## 2024-03-08 VITALS — BP 126/71 | HR 73 | Temp 97.9°F | Ht 71.0 in | Wt 156.0 lb

## 2024-03-08 DIAGNOSIS — R7401 Elevation of levels of liver transaminase levels: Secondary | ICD-10-CM

## 2024-03-08 DIAGNOSIS — Z6821 Body mass index (BMI) 21.0-21.9, adult: Secondary | ICD-10-CM | POA: Diagnosis not present

## 2024-03-08 DIAGNOSIS — Z6830 Body mass index (BMI) 30.0-30.9, adult: Secondary | ICD-10-CM

## 2024-03-08 DIAGNOSIS — E1169 Type 2 diabetes mellitus with other specified complication: Secondary | ICD-10-CM | POA: Diagnosis not present

## 2024-03-08 DIAGNOSIS — Z7985 Long-term (current) use of injectable non-insulin antidiabetic drugs: Secondary | ICD-10-CM

## 2024-03-08 DIAGNOSIS — E669 Obesity, unspecified: Secondary | ICD-10-CM

## 2024-03-08 DIAGNOSIS — E559 Vitamin D deficiency, unspecified: Secondary | ICD-10-CM | POA: Diagnosis not present

## 2024-03-08 DIAGNOSIS — E1159 Type 2 diabetes mellitus with other circulatory complications: Secondary | ICD-10-CM

## 2024-03-08 DIAGNOSIS — I152 Hypertension secondary to endocrine disorders: Secondary | ICD-10-CM | POA: Diagnosis not present

## 2024-03-08 MED ORDER — VITAMIN D (ERGOCALCIFEROL) 1.25 MG (50000 UNIT) PO CAPS
50000.0000 [IU] | ORAL_CAPSULE | ORAL | 0 refills | Status: DC
  Filled 2024-03-08 – 2024-05-15 (×2): qty 6, 84d supply, fill #0

## 2024-03-08 MED ORDER — SEMAGLUTIDE (1 MG/DOSE) 4 MG/3ML ~~LOC~~ SOPN
1.0000 mg | PEN_INJECTOR | SUBCUTANEOUS | 0 refills | Status: DC
  Filled 2024-03-08 – 2024-03-14 (×2): qty 9, 84d supply, fill #0

## 2024-03-08 NOTE — Progress Notes (Signed)
 WEIGHT SUMMARY AND BIOMETRICS  Vitals Temp: 97.9 F (36.6 C) BP: 126/71 Pulse Rate: 73 SpO2: 99 %   Anthropometric Measurements Height: 5' 11 (1.803 m) Weight: 156 lb (70.8 kg) BMI (Calculated): 21.77 Weight at Last Visit: 153lb Weight Lost Since Last Visit: 0lb Weight Gained Since Last Visit: 3lb Starting Weight: 205lb Total Weight Loss (lbs): 49 lb (22.2 kg)   Body Composition  Body Fat %: 27.7 % Fat Mass (lbs): 43.2 lbs Muscle Mass (lbs): 107 lbs Total Body Water  (lbs): 71.2 lbs Visceral Fat Rating : 5   Other Clinical Data Fasting: No Labs: No Today's Visit #: 28 Starting Date: 08/22/20    Chief Complaint:   OBESITY [name] is here to discuss her progress with her obesity treatment plan.  She is on the practicing portion control and making smarter food choices, such as increasing vegetables and decreasing simple carbohydrates and states she is following her eating plan approximately 98 % of the time.  She states she is exercising Walking 30 minutes 2 times per week.  Interim History:  Mx Phase at [facility] since Summer 2023   Fasting Labs completed at June 2025 OV CMP results forwarded to established PCP  She has resumed nightly Acetaminophen - discussed limiting Hepatotoxic substances She denies RUQ pain  She endorses increased stress at work, as she is covering two nursing admin roles currently  Subjective:   1. Type 2 diabetes mellitus with other specified complication, without long-term current use of insulin  [facility]) Discussed Labs  Latest Reference Range & Units 12/15/23 08:50  Glucose 70 - 99 mg/dL 64 (L)  Hemoglobin J8R 4.8 - 5.6 % 5.2  Est. average glucose Bld gHb Est-mCnc mg/dL 896  (L): Data is abnormally low  CBG below goal A1c Low normal She is on weekly Ozempic  1mg  Home fasting CBG will range from high 70s to upper 90s She vehemently denies sx's of hypoglycemia She has snacks readily available all day.  2. Hypertension associated  with diabetes (HCC) Discussed Labs 12/15/23 CMP: Electrolytes, Kidney Fx, Liver Enzymes- stable Results forwarded to PCP She has remained off statin therapy per PCP She has resumed nightly Acetaminophen - discussed limiting Hepatotoxic substances  3. Vitamin D  deficiency Discussed Labs  Latest Reference Range & Units 12/15/23 08:50  Vitamin D , 25-Hydroxy 30.0 - 100.0 ng/mL 63.3   Vit D level stable and at goal She is on bi-weekly Ergocalciferol - denies N/V/Muscle Weakness  4. Elevated ALT measurement 11/12/2023 OV with PCP- Dr. Antonetta stopped statin due to slightly elevated ALT She denies RUQ pain She was taking Acetaminophen  650mg  every night. She has since reduced to 1/2 tab at bedtime  Discussed Labs  Latest Reference Range & Units 12/15/23 08:50  Alkaline Phosphatase 44 - 121 IU/L 69  Albumin 3.8 - 4.9 g/dL 4.6  AST 0 - 40 IU/L 22  ALT 0 - 32 IU/L 22   Liver Enzymes have normalized- results were forward to pt's PCP  Assessment/Plan:   1. Type 2 diabetes mellitus with other specified complication, without long-term current use of insulin  (HCC) Refill - Semaglutide , 1 MG/DOSE, 4 MG/3ML SOPN; Inject 1 mg into the skin once a week.  Dispense: 9 mL; Refill: 0  2. Hypertension associated with diabetes (HCC) Remain well hydrated  Limit Na+ intake F/u with established PCP  3. Vitamin D  deficiency Refill  Vitamin D , Ergocalciferol , (DRISDOL ) 1.25 MG (50000 UNIT) CAPS capsule Take 1 capsule (50,000 Units total) by mouth every 14 (fourteen) days. Dispense: 6 capsule, Refills:  0 of 0 remaining   4. Elevated ALT measurement (Primary) Limit Acetaminophen  use  5. Obesity, current BMI 21.8  [name] is currently in the action stage of change. As such, her goal is to maintain weight for now. She has agreed to practicing portion control and making smarter food choices, such as increasing vegetables and decreasing simple carbohydrates.   Exercise goals: For substantial health  benefits, adults should do at least 150 minutes (2 hours and 30 minutes) a week of moderate-intensity, or 75 minutes (1 hour and 15 minutes) a week of vigorous-intensity aerobic physical activity, or an equivalent combination of moderate- and vigorous-intensity aerobic activity. Aerobic activity should be performed in episodes of at least 10 minutes, and preferably, it should be spread throughout the week.  Behavioral modification strategies: increasing lean protein intake, decreasing simple carbohydrates, increasing vegetables, increasing water  intake, meal planning and cooking strategies, keeping healthy foods in the home, ways to avoid boredom eating, ways to avoid night time snacking, and planning for success.  [name] has agreed to follow-up with our clinic in 12 weeks. She was informed of the importance of frequent follow-up visits to maximize her success with intensive lifestyle modifications for her multiple health conditions.   Objective:   Blood pressure 126/71, pulse 73, temperature 97.9 F (36.6 C), height 5' 11 (1.803 m), weight 156 lb (70.8 kg), SpO2 99%. Body mass index is 21.76 kg/m.  General: Cooperative, alert, well developed, in no acute distress. HEENT: Conjunctivae and lids unremarkable. Cardiovascular: Regular rhythm.  Lungs: Normal work of breathing. Neurologic: No focal deficits.   Lab Results  Component Value Date   CREATININE 0.84 12/15/2023   BUN 15 12/15/2023   NA 140 12/15/2023   K 4.1 12/15/2023   CL 100 12/15/2023   CO2 21 12/15/2023   Lab Results  Component Value Date   ALT 22 12/15/2023   AST 22 12/15/2023   ALKPHOS 69 12/15/2023   BILITOT 0.2 12/15/2023   Lab Results  Component Value Date   HGBA1C 5.2 12/15/2023   HGBA1C 5.2 04/14/2023   HGBA1C 5.2 11/17/2022   HGBA1C 5.2 06/29/2022   HGBA1C 5.2 11/19/2021   Lab Results  Component Value Date   INSULIN  2.9 04/14/2023   INSULIN  28.7 (H) 11/19/2021   INSULIN  8.6 07/09/2021   INSULIN  19.2  04/15/2021   INSULIN  7.8 08/22/2020   Lab Results  Component Value Date   TSH 1.210 03/31/2023   Lab Results  Component Value Date   CHOL 149 11/05/2023   HDL 74 11/05/2023   LDLCALC 65 11/05/2023   TRIG 46 11/05/2023   CHOLHDL 2.0 11/05/2023   Lab Results  Component Value Date   VD25OH 63.3 12/15/2023   VD25OH 78.4 11/05/2023   VD25OH 63.4 04/14/2023   Lab Results  Component Value Date   WBC 5.3 06/03/2023   HGB 13.2 06/03/2023   HCT 39.4 06/03/2023   MCV 92.7 06/03/2023   PLT 237 06/03/2023   Lab Results  Component Value Date   IRON  66 06/03/2023   TIBC 340 06/03/2023   FERRITIN 116 06/03/2023   Attestation Statements:   Reviewed by clinician on day of visit: allergies, medications, problem list, medical history, surgical history, family history, social history, and previous encounter notes.  I have reviewed the above documentation for accuracy and completeness, and I agree with the above. -  [name] d. Haskell Rihn, NP-C

## 2024-03-11 ENCOUNTER — Other Ambulatory Visit: Payer: Self-pay | Admitting: Family Medicine

## 2024-03-13 ENCOUNTER — Other Ambulatory Visit: Payer: Self-pay | Admitting: Internal Medicine

## 2024-03-13 ENCOUNTER — Other Ambulatory Visit: Payer: Self-pay

## 2024-03-13 ENCOUNTER — Other Ambulatory Visit (HOSPITAL_COMMUNITY): Payer: Self-pay

## 2024-03-13 ENCOUNTER — Encounter: Payer: Self-pay | Admitting: Family Medicine

## 2024-03-13 DIAGNOSIS — Z23 Encounter for immunization: Secondary | ICD-10-CM

## 2024-03-13 DIAGNOSIS — E1169 Type 2 diabetes mellitus with other specified complication: Secondary | ICD-10-CM

## 2024-03-13 DIAGNOSIS — I10 Essential (primary) hypertension: Secondary | ICD-10-CM

## 2024-03-13 MED ORDER — LOSARTAN POTASSIUM 25 MG PO TABS
25.0000 mg | ORAL_TABLET | Freq: Every day | ORAL | 3 refills | Status: DC
  Filled 2024-03-13: qty 30, 30d supply, fill #0
  Filled 2024-04-12: qty 30, 30d supply, fill #1
  Filled 2024-05-14: qty 30, 30d supply, fill #2
  Filled 2024-06-09: qty 30, 30d supply, fill #3

## 2024-03-13 MED ORDER — SPIKEVAX 50 MCG/0.5ML IM SUSY: 0.5000 mL | PREFILLED_SYRINGE | Freq: Once | INTRAMUSCULAR | 0 refills | Status: AC

## 2024-03-14 ENCOUNTER — Other Ambulatory Visit (HOSPITAL_COMMUNITY): Payer: Self-pay

## 2024-03-14 ENCOUNTER — Other Ambulatory Visit: Payer: Self-pay

## 2024-03-20 ENCOUNTER — Other Ambulatory Visit: Payer: Self-pay

## 2024-03-29 ENCOUNTER — Other Ambulatory Visit (HOSPITAL_COMMUNITY): Payer: Self-pay

## 2024-03-29 ENCOUNTER — Other Ambulatory Visit: Payer: Self-pay | Admitting: Family Medicine

## 2024-03-29 ENCOUNTER — Other Ambulatory Visit: Payer: Self-pay

## 2024-03-29 MED ORDER — MISOPROSTOL 200 MCG PO TABS
200.0000 ug | ORAL_TABLET | Freq: Two times a day (BID) | ORAL | 3 refills | Status: DC | PRN
  Filled 2024-03-29: qty 60, 30d supply, fill #0
  Filled 2024-06-09: qty 60, 30d supply, fill #1
  Filled 2024-07-15: qty 60, 30d supply, fill #2

## 2024-03-31 ENCOUNTER — Other Ambulatory Visit: Payer: Self-pay | Admitting: Internal Medicine

## 2024-03-31 ENCOUNTER — Encounter: Payer: Self-pay | Admitting: Family Medicine

## 2024-03-31 DIAGNOSIS — Z1159 Encounter for screening for other viral diseases: Secondary | ICD-10-CM

## 2024-04-01 LAB — HCV INTERPRETATION

## 2024-04-01 LAB — HCV AB W REFLEX TO QUANT PCR: HCV Ab: NONREACTIVE

## 2024-04-03 ENCOUNTER — Ambulatory Visit: Payer: Self-pay | Admitting: Internal Medicine

## 2024-04-12 ENCOUNTER — Other Ambulatory Visit (HOSPITAL_COMMUNITY): Payer: Self-pay

## 2024-04-20 DIAGNOSIS — D500 Iron deficiency anemia secondary to blood loss (chronic): Secondary | ICD-10-CM | POA: Diagnosis not present

## 2024-04-20 DIAGNOSIS — I10 Essential (primary) hypertension: Secondary | ICD-10-CM | POA: Diagnosis not present

## 2024-04-21 LAB — CBC WITH DIFFERENTIAL/PLATELET
Basophils Absolute: 0 x10E3/uL (ref 0.0–0.2)
Basos: 1 %
EOS (ABSOLUTE): 0.1 x10E3/uL (ref 0.0–0.4)
Eos: 2 %
Hematocrit: 39.7 % (ref 34.0–46.6)
Hemoglobin: 12.4 g/dL (ref 11.1–15.9)
Immature Grans (Abs): 0 x10E3/uL (ref 0.0–0.1)
Immature Granulocytes: 0 %
Lymphocytes Absolute: 2.8 x10E3/uL (ref 0.7–3.1)
Lymphs: 49 %
MCH: 29.5 pg (ref 26.6–33.0)
MCHC: 31.2 g/dL — ABNORMAL LOW (ref 31.5–35.7)
MCV: 95 fL (ref 79–97)
Monocytes Absolute: 0.8 x10E3/uL (ref 0.1–0.9)
Monocytes: 13 %
Neutrophils Absolute: 2 x10E3/uL (ref 1.4–7.0)
Neutrophils: 35 %
Platelets: 244 x10E3/uL (ref 150–450)
RBC: 4.2 x10E6/uL (ref 3.77–5.28)
RDW: 12.3 % (ref 11.7–15.4)
WBC: 5.8 x10E3/uL (ref 3.4–10.8)

## 2024-04-21 LAB — CMP14+EGFR
ALT: 13 IU/L (ref 0–32)
AST: 20 IU/L (ref 0–40)
Albumin: 4.2 g/dL (ref 3.8–4.9)
Alkaline Phosphatase: 58 IU/L (ref 49–135)
BUN/Creatinine Ratio: 16 (ref 9–23)
BUN: 13 mg/dL (ref 6–24)
Bilirubin Total: 0.2 mg/dL (ref 0.0–1.2)
CO2: 24 mmol/L (ref 20–29)
Calcium: 9.4 mg/dL (ref 8.7–10.2)
Chloride: 102 mmol/L (ref 96–106)
Creatinine, Ser: 0.83 mg/dL (ref 0.57–1.00)
Globulin, Total: 2.7 g/dL (ref 1.5–4.5)
Glucose: 54 mg/dL — ABNORMAL LOW (ref 70–99)
Potassium: 4.1 mmol/L (ref 3.5–5.2)
Sodium: 139 mmol/L (ref 134–144)
Total Protein: 6.9 g/dL (ref 6.0–8.5)
eGFR: 84 mL/min/1.73 (ref >59)

## 2024-04-21 LAB — LIPID PANEL
Chol/HDL Ratio: 2.7 ratio (ref 0.0–4.4)
Cholesterol, Total: 193 mg/dL (ref 100–199)
HDL: 71 mg/dL (ref >39)
LDL Chol Calc (NIH): 109 mg/dL — ABNORMAL HIGH (ref 0–99)
Triglycerides: 72 mg/dL (ref 0–149)
VLDL Cholesterol Cal: 13 mg/dL (ref 5–40)

## 2024-04-21 LAB — TSH: TSH: 1.64 u[IU]/mL (ref 0.450–4.500)

## 2024-04-24 ENCOUNTER — Ambulatory Visit: Payer: Self-pay | Admitting: Family Medicine

## 2024-04-24 ENCOUNTER — Encounter: Payer: Self-pay | Admitting: Radiology

## 2024-04-25 ENCOUNTER — Ambulatory Visit: Admitting: Family Medicine

## 2024-04-25 ENCOUNTER — Other Ambulatory Visit: Payer: Self-pay

## 2024-04-25 ENCOUNTER — Other Ambulatory Visit (HOSPITAL_COMMUNITY): Payer: Self-pay

## 2024-04-25 ENCOUNTER — Encounter: Payer: Self-pay | Admitting: Family Medicine

## 2024-04-25 VITALS — BP 138/82 | HR 61 | Resp 16 | Ht 71.0 in | Wt 165.1 lb

## 2024-04-25 DIAGNOSIS — N926 Irregular menstruation, unspecified: Secondary | ICD-10-CM | POA: Diagnosis not present

## 2024-04-25 DIAGNOSIS — Z0001 Encounter for general adult medical examination with abnormal findings: Secondary | ICD-10-CM | POA: Diagnosis not present

## 2024-04-25 DIAGNOSIS — Z23 Encounter for immunization: Secondary | ICD-10-CM | POA: Diagnosis not present

## 2024-04-25 DIAGNOSIS — R102 Pelvic and perineal pain unspecified side: Secondary | ICD-10-CM | POA: Insufficient documentation

## 2024-04-25 DIAGNOSIS — N951 Menopausal and female climacteric states: Secondary | ICD-10-CM

## 2024-04-25 DIAGNOSIS — F320 Major depressive disorder, single episode, mild: Secondary | ICD-10-CM | POA: Diagnosis not present

## 2024-04-25 DIAGNOSIS — D539 Nutritional anemia, unspecified: Secondary | ICD-10-CM | POA: Diagnosis not present

## 2024-04-25 DIAGNOSIS — F411 Generalized anxiety disorder: Secondary | ICD-10-CM

## 2024-04-25 DIAGNOSIS — N912 Amenorrhea, unspecified: Secondary | ICD-10-CM | POA: Diagnosis not present

## 2024-04-25 MED ORDER — ESCITALOPRAM OXALATE 10 MG PO TABS
10.0000 mg | ORAL_TABLET | Freq: Every day | ORAL | 5 refills | Status: AC
  Filled 2024-04-25: qty 30, 30d supply, fill #0
  Filled 2024-05-21: qty 30, 30d supply, fill #1
  Filled 2024-06-20: qty 30, 30d supply, fill #2
  Filled 2024-07-15: qty 90, 90d supply, fill #3

## 2024-04-25 NOTE — Assessment & Plan Note (Signed)
 After obtaining informed consent, the pneumonia 20  vaccine is  administered , with no adverse effect noted at the time of administration.

## 2024-04-25 NOTE — Assessment & Plan Note (Signed)
 Check fsh annd LH, spotting after 8 months with breast tenderness and also mood instability and weight gain

## 2024-04-25 NOTE — Assessment & Plan Note (Signed)

## 2024-04-25 NOTE — Patient Instructions (Addendum)
 F/U in 8 to 10 weeks  Pneumonia vaccine today   Pls add fSH, LH, iron , ferritin and B12 to recent labs  You will be referred for pelvic US   New for anxiety and depression is daily lexapro   Pls call employee assistance program for mental health counselling   It is important that you exercise regularly at least 30 minutes 5 times a week. If you develop chest pain, have severe difficulty breathing, or feel very tired, stop exercising immediately and seek medical attention

## 2024-04-25 NOTE — Assessment & Plan Note (Addendum)
 Recommend therapy through employee assistance and start medication, commit to regular exercise , follow up in 8 weeks

## 2024-04-25 NOTE — Progress Notes (Signed)
    [name]     MRN: [id number]      DOB: 1971-03-20  Chief Complaint  Patient presents with   Annual Exam   Obesity    Thinks her hormones are off- Has been gaining weight- hasn't had a regular cycle since March but has been spotting and breasts are very tender     HPI: Patient is in for annual physical exam. Irregular menses , no period x 8 monhts now spotting with breas tenderness and increased anxiety and depression, stressed, spouse admitted himself for rehab from alcohol, states she has told him she will not tolerate his alcohol addiction and the ensuing problems Recent labs,  are reviewed. Immunization is reviewed , and  updated if needed.   PE: BP (!) 156/89   Pulse 61   Resp 16   Ht 5' 11 (1.803 m)   Wt 165 lb 1.9 oz (74.9 kg)   SpO2 100%   BMI 23.03 kg/m   Pleasant  female, alert and oriented x 3, in no cardio-pulmonary distress. Afebrile. HEENT No facial trauma or asymetry. Sinuses non tender.  Extra occullar muscles intact.. External ears normal, . Neck: supple, no adenopathy,JVD or thyromegaly.No bruits.  Chest: Clear to ascultation bilaterally.No crackles or wheezes. Non tender to palpation    Cardiovascular system; Heart sounds normal,  S1 and  S2 ,no S3.  No murmur, or thrill. Apical beat not displaced Peripheral pulses normal.  Abdomen: Soft, non tender, no organomegaly or masses. No bruits. Bowel sounds normal. No guarding, tenderness or rebound.    Musculoskeletal exam: Full ROM of spine, hips , shoulders and knees. No deformity ,swelling or crepitus noted. No muscle wasting or atrophy.   Neurologic: Cranial nerves 2 to 12 intact. Power, tone ,sensation and reflexes normal throughout. No disturbance in gait. No tremor.  Skin: Intact, no ulceration, erythema , scaling or rash noted. Pigmentation normal throughout  Psych; Anxious and mildly depressed . Judgement and concentration normal   Assessment & Plan:   Peri-menopause Check fsh annd LH, spotting after 8 months with breast tenderness and also mood instability and weight gain  Annual visit for general adult medical examination with abnormal findings Annual exam as documented. Counseling done  re healthy lifestyle involving commitment to 150 minutes exercise per week, heart healthy diet, and attaining healthy weight.The importance of adequate sleep also discussed. Regular seat belt use and home safety, is also discussed. Changes in health habits are decided on by the patient with goals and time frames  set for achieving them. Immunization and cancer screening needs are specifically addressed at this visit.   Irregular menses Likely perimenopause check fsh and lh and obtain uS   GAD (generalized anxiety disorder) Uncontrolled start lexapro  and recommend therapy through employee assistance  Depression, major, single episode, mild Recommend therapy through employee assistance and start medication, commit to regular exercise , follow up in 8 weeks  Encounter for immunization After obtaining informed consent, the  pneumonia 20 vaccine is  administered , with no adverse effect noted at the time of administration.

## 2024-04-25 NOTE — Assessment & Plan Note (Signed)
 Likely perimenopause check fsh and lh and obtain uS 

## 2024-04-25 NOTE — Assessment & Plan Note (Addendum)
 Uncontrolled start lexapro  and recommend therapy through employee assistance

## 2024-04-26 ENCOUNTER — Ambulatory Visit: Payer: Self-pay | Admitting: Family Medicine

## 2024-04-26 LAB — FSH/LH
FSH: 22.6 m[IU]/mL
LH: 14.9 m[IU]/mL

## 2024-04-26 LAB — IRON: Iron: 50 ug/dL (ref 27–159)

## 2024-04-26 LAB — VITAMIN B12: Vitamin B-12: 962 pg/mL (ref 232–1245)

## 2024-04-26 LAB — FERRITIN: Ferritin: 159 ng/mL — ABNORMAL HIGH (ref 15–150)

## 2024-04-27 ENCOUNTER — Other Ambulatory Visit (HOSPITAL_COMMUNITY): Payer: Self-pay

## 2024-05-04 ENCOUNTER — Encounter (HOSPITAL_COMMUNITY): Payer: Self-pay | Admitting: Oncology

## 2024-05-05 ENCOUNTER — Ambulatory Visit (HOSPITAL_COMMUNITY)
Admission: RE | Admit: 2024-05-05 | Discharge: 2024-05-05 | Disposition: A | Discharge status: Home / Self Care | Source: Ambulatory Visit | Attending: Family Medicine | Admitting: Family Medicine

## 2024-05-05 DIAGNOSIS — N926 Irregular menstruation, unspecified: Secondary | ICD-10-CM | POA: Diagnosis not present

## 2024-05-05 DIAGNOSIS — N83291 Other ovarian cyst, right side: Secondary | ICD-10-CM | POA: Diagnosis not present

## 2024-05-05 DIAGNOSIS — R102 Pelvic and perineal pain unspecified side: Secondary | ICD-10-CM | POA: Diagnosis not present

## 2024-05-15 ENCOUNTER — Other Ambulatory Visit: Payer: Self-pay

## 2024-05-15 ENCOUNTER — Other Ambulatory Visit (HOSPITAL_COMMUNITY): Payer: Self-pay

## 2024-05-15 ENCOUNTER — Other Ambulatory Visit: Payer: Self-pay | Admitting: Family Medicine

## 2024-05-15 MED ORDER — FAMOTIDINE 40 MG PO TABS
40.0000 mg | ORAL_TABLET | Freq: Every day | ORAL | 1 refills | Status: AC
  Filled 2024-05-15: qty 90, 90d supply, fill #0

## 2024-05-19 ENCOUNTER — Other Ambulatory Visit (HOSPITAL_COMMUNITY): Payer: Self-pay

## 2024-05-22 ENCOUNTER — Other Ambulatory Visit (HOSPITAL_COMMUNITY): Payer: Self-pay

## 2024-05-22 ENCOUNTER — Other Ambulatory Visit (HOSPITAL_BASED_OUTPATIENT_CLINIC_OR_DEPARTMENT_OTHER): Payer: Self-pay

## 2024-05-29 ENCOUNTER — Other Ambulatory Visit (HOSPITAL_BASED_OUTPATIENT_CLINIC_OR_DEPARTMENT_OTHER): Payer: Self-pay

## 2024-05-29 ENCOUNTER — Other Ambulatory Visit (HOSPITAL_COMMUNITY): Payer: Self-pay

## 2024-05-31 ENCOUNTER — Ambulatory Visit (INDEPENDENT_AMBULATORY_CARE_PROVIDER_SITE_OTHER): Payer: Self-pay | Admitting: Adult Health

## 2024-05-31 ENCOUNTER — Other Ambulatory Visit (HOSPITAL_COMMUNITY): Payer: Self-pay

## 2024-05-31 ENCOUNTER — Other Ambulatory Visit: Payer: Self-pay

## 2024-05-31 ENCOUNTER — Encounter (INDEPENDENT_AMBULATORY_CARE_PROVIDER_SITE_OTHER): Payer: Self-pay | Admitting: Adult Health

## 2024-05-31 VITALS — BP 130/81 | HR 65 | Temp 97.7°F | Ht 71.0 in | Wt 153.0 lb

## 2024-05-31 DIAGNOSIS — Z7985 Long-term (current) use of injectable non-insulin antidiabetic drugs: Secondary | ICD-10-CM | POA: Diagnosis not present

## 2024-05-31 DIAGNOSIS — Z6821 Body mass index (BMI) 21.0-21.9, adult: Secondary | ICD-10-CM | POA: Diagnosis not present

## 2024-05-31 DIAGNOSIS — E1169 Type 2 diabetes mellitus with other specified complication: Secondary | ICD-10-CM | POA: Diagnosis not present

## 2024-05-31 DIAGNOSIS — E669 Obesity, unspecified: Secondary | ICD-10-CM | POA: Diagnosis not present

## 2024-05-31 DIAGNOSIS — F411 Generalized anxiety disorder: Secondary | ICD-10-CM | POA: Diagnosis not present

## 2024-05-31 DIAGNOSIS — N951 Menopausal and female climacteric states: Secondary | ICD-10-CM | POA: Diagnosis not present

## 2024-05-31 DIAGNOSIS — E559 Vitamin D deficiency, unspecified: Secondary | ICD-10-CM | POA: Diagnosis not present

## 2024-05-31 DIAGNOSIS — E66811 Obesity, class 1: Secondary | ICD-10-CM

## 2024-05-31 MED ORDER — VITAMIN D (ERGOCALCIFEROL) 1.25 MG (50000 UNIT) PO CAPS
50000.0000 [IU] | ORAL_CAPSULE | ORAL | 0 refills | Status: AC
  Filled 2024-05-31: qty 6, 84d supply, fill #0

## 2024-05-31 MED ORDER — SEMAGLUTIDE (1 MG/DOSE) 4 MG/3ML ~~LOC~~ SOPN
1.0000 mg | PEN_INJECTOR | SUBCUTANEOUS | 0 refills | Status: AC
  Filled 2024-05-31: qty 9, 84d supply, fill #0

## 2024-05-31 NOTE — Progress Notes (Signed)
 WEIGHT SUMMARY AND BIOMETRICS  Vitals Temp: 97.7 F (36.5 C) BP: 130/81 Pulse Rate: 65 SpO2: 100 %   Anthropometric Measurements Height: 5' 11 (1.803 m) Weight: 153 lb (69.4 kg) BMI (Calculated): 21.35 Weight at Last Visit: 156 lb Weight Lost Since Last Visit: 3 lb Weight Gained Since Last Visit: 0 Starting Weight: 205 lb Total Weight Loss (lbs): 52 lb (23.6 kg)   Body Composition  Body Fat %: 26.9 % Fat Mass (lbs): 41.2 lbs Muscle Mass (lbs): 106.2 lbs Total Body Water  (lbs): 69.2 lbs Visceral Fat Rating : 5   Other Clinical Data Fasting: no Labs: no Today's Visit #: 29 Starting Date: 08/22/20    Chief Complaint:   OBESITY [name] is here to discuss her progress with her obesity treatment plan.  She is on the practicing portion control and making smarter food choices, such as increasing vegetables and decreasing simple carbohydrates and states she is following her eating plan approximately 95 % of the time.  She states she is exercising: NEAT   Interim History:  Mx Phase at [facility] since Summer 2023   05/05/2024 annual wellness OV with established PCP Labs completed- reviewed in EPIC She was started on daily Lexapro  10mg   She has been working 60 hrs per week due to increased work load.     05/31/24 08:00  Weight 153 lb (69.4 kg)  BMI (Calculated) 21.35  Total Weight Loss (lbs) 52 lb (23.6 kg)    05/31/24 08:00   Body Fat % 26.9 %  Muscle Mass (lbs) 106.2 lbs  Fat Mass (lbs) 41.2 lbs  Total Body Water  (lbs) 69.2 lbs  Visceral Fat Rating  5   Subjective:   1. Peri-menopause 04/25/2024 PCP OV Notes: Patient presents with   Annual Exam   Obesity      Thinks her hormones are off- Has been gaining weight- hasn't had a regular cycle since March but has been spotting and breasts are very tender       HPI: Patient is in for annual physical exam. Irregular menses , no period x 8 monhts now spotting with breas tenderness and increased anxiety and  depression, stressed, spouse admitted himself for rehab from alcohol, states she has told him she will not tolerate his alcohol addiction and the ensuing problems Recent labs,  are reviewed. Immunization is reviewed , and  updated if needed.  2. Vitamin D  deficiency  Latest Reference Range & Units 12/15/23 08:50  Vitamin D , 25-Hydroxy 30.0 - 100.0 ng/mL 63.3   She is on bi-weekly Ergocalciferol  She denies N/V/Muscle Weakness  3. Type 2 diabetes mellitus with other specified complication, without long-term current use of insulin  (HCC) Lab Results  Component Value Date   HGBA1C 5.2 12/15/2023   HGBA1C 5.2 04/14/2023   HGBA1C 5.2 11/17/2022    She is taking Ozempic  1mg  weekly Denies mass in neck, dysphagia, dyspepsia, persistent hoarseness, abdominal pain, or N/V/C  She denies sx's of hypoglycemia  4. GAD 04/25/2024 PCP started her on daily Lexapro  10mg  She endorses improved mood and reduced levels of stress/anxiety Her husband completed a 2 month recovery for alcohol addiction. He is home, refraining from ETOH  Assessment/Plan:   1. Peri-menopause (Primary) Balanced workout plan for perimenopausal women should include a mix of resistance training, high-intensity interval training (HIIT), and joint-friendly aerobic exercises. Resistance training is crucial for building muscle and bone density, while aerobic exercise helps with cardiovascular health. HIIT is beneficial for burning fat, and incorporating flexibility and mobility  work like yoga or Pilates is important for overall well-being  2. Vitamin D  deficiency Check Labs -Vit D Level Refill Vitamin D , Ergocalciferol , (DRISDOL ) 1.25 MG (50000 UNIT) CAPS capsule Take 1 capsule (50,000 Units total) by mouth every 14 (fourteen) days. Dispense: 6 capsule, Refills: 0 of 0 remaining   3. Type 2 diabetes mellitus with other specified complication, without long-term current use of insulin  (HCC) Check Labs -A1c Refill  Semaglutide , 1  MG/DOSE, 4 MG/3ML SOPN Inject 1 mg into the skin once a week. Dispense: 9 mL, Refills: 0 of 0 remaining   4. GAD Continue Lexapro  per PCP Increase regular exercise- home treadmill 30 mins at least 3 x week  5. Obesity, current BMI 21.3  [name] is currently in the action stage of change. As such, her goal is to maintain weight for now. She has agreed to practicing portion control and making smarter food choices, such as increasing vegetables and decreasing simple carbohydrates.   Exercise goals: All adults should avoid inactivity. Some physical activity is better than none, and adults who participate in any amount of physical activity gain some health benefits. Adults should also include muscle-strengthening activities that involve all major muscle groups on 2 or more days a week. Walk on home treadmill 30 mins 3 x week.  Behavioral modification strategies: increasing lean protein intake, decreasing simple carbohydrates, increasing vegetables, increasing water  intake, no skipping meals, meal planning and cooking strategies, keeping healthy foods in the home, emotional eating strategies, holiday eating strategies , and planning for success.  [name] has agreed to follow-up with our clinic in 4 weeks. She was informed of the importance of frequent follow-up visits to maximize her success with intensive lifestyle modifications for her multiple health conditions.   [name] was informed we would discuss her lab results at her next visit unless there is a critical issue that needs to be addressed sooner. [name] agreed to keep her next visit at the agreed upon time to discuss these results.  Objective:   Blood pressure 130/81, pulse 65, temperature 97.7 F (36.5 C), height 5' 11 (1.803 m), weight 153 lb (69.4 kg), SpO2 100%. Body mass index is 21.34 kg/m.  General: Cooperative, alert, well developed, in no acute distress. HEENT: Conjunctivae and lids unremarkable. Cardiovascular: Regular rhythm.   Lungs: Normal work of breathing. Neurologic: No focal deficits.   Lab Results  Component Value Date   CREATININE 0.83 04/20/2024   BUN 13 04/20/2024   NA 139 04/20/2024   K 4.1 04/20/2024   CL 102 04/20/2024   CO2 24 04/20/2024   Lab Results  Component Value Date   ALT 13 04/20/2024   AST 20 04/20/2024   ALKPHOS 58 04/20/2024   BILITOT 0.2 04/20/2024   Lab Results  Component Value Date   HGBA1C 5.2 12/15/2023   HGBA1C 5.2 04/14/2023   HGBA1C 5.2 11/17/2022   HGBA1C 5.2 06/29/2022   HGBA1C 5.2 11/19/2021   Lab Results  Component Value Date   INSULIN  2.9 04/14/2023   INSULIN  28.7 (H) 11/19/2021   INSULIN  8.6 07/09/2021   INSULIN  19.2 04/15/2021   INSULIN  7.8 08/22/2020   Lab Results  Component Value Date   TSH 1.640 04/20/2024   Lab Results  Component Value Date   CHOL 193 04/20/2024   HDL 71 04/20/2024   LDLCALC 109 (H) 04/20/2024   TRIG 72 04/20/2024   CHOLHDL 2.7 04/20/2024   Lab Results  Component Value Date   VD25OH 63.3 12/15/2023   VD25OH 78.4 11/05/2023  VD25OH 63.4 04/14/2023   Lab Results  Component Value Date   WBC 5.8 04/20/2024   HGB 12.4 04/20/2024   HCT 39.7 04/20/2024   MCV 95 04/20/2024   PLT 244 04/20/2024   Lab Results  Component Value Date   IRON  50 04/25/2024   TIBC 340 06/03/2023   FERRITIN 159 (H) 04/25/2024    Attestation Statements:   Reviewed by clinician on day of visit: allergies, medications, problem list, medical history, surgical history, family history, social history, and previous encounter notes.  I have reviewed the above documentation for accuracy and completeness, and I agree with the above. -  Clemie General d. Jaylianna Tatlock, NP-C

## 2024-06-01 ENCOUNTER — Encounter (INDEPENDENT_AMBULATORY_CARE_PROVIDER_SITE_OTHER): Payer: Self-pay | Admitting: Adult Health

## 2024-06-01 LAB — HEMOGLOBIN A1C
Est. average glucose Bld gHb Est-mCnc: 100 mg/dL
Hgb A1c MFr Bld: 5.1 % (ref 4.8–5.6)

## 2024-06-01 LAB — VITAMIN D 25 HYDROXY (VIT D DEFICIENCY, FRACTURES): Vit D, 25-Hydroxy: 66.1 ng/mL (ref 30.0–100.0)

## 2024-06-09 ENCOUNTER — Encounter: Payer: Self-pay | Admitting: Family Medicine

## 2024-06-09 ENCOUNTER — Other Ambulatory Visit: Payer: Self-pay | Admitting: Family Medicine

## 2024-06-09 ENCOUNTER — Telehealth: Payer: Self-pay

## 2024-06-09 ENCOUNTER — Other Ambulatory Visit: Payer: Self-pay

## 2024-06-09 DIAGNOSIS — M5430 Sciatica, unspecified side: Secondary | ICD-10-CM

## 2024-06-09 MED ORDER — CYCLOBENZAPRINE HCL 10 MG PO TABS
10.0000 mg | ORAL_TABLET | Freq: Every day | ORAL | 1 refills | Status: DC
  Filled 2024-06-09: qty 90, 90d supply, fill #0

## 2024-06-09 NOTE — Telephone Encounter (Signed)
Pt requesting refill of cyclobenzaprine.

## 2024-06-09 NOTE — Addendum Note (Signed)
 Addended by: ANTONETTA QUANT E on: 06/09/2024 11:10 AM   Modules accepted: Orders

## 2024-06-20 ENCOUNTER — Other Ambulatory Visit (HOSPITAL_COMMUNITY): Payer: Self-pay

## 2024-06-25 ENCOUNTER — Other Ambulatory Visit: Payer: Self-pay | Admitting: Family Medicine

## 2024-06-26 ENCOUNTER — Other Ambulatory Visit (HOSPITAL_COMMUNITY): Payer: Self-pay

## 2024-06-26 MED ORDER — DICLOFENAC SODIUM 50 MG PO TBEC
50.0000 mg | DELAYED_RELEASE_TABLET | Freq: Every day | ORAL | 5 refills | Status: AC
  Filled 2024-06-26: qty 30, 30d supply, fill #0
  Filled 2024-07-15: qty 30, 30d supply, fill #1

## 2024-07-15 ENCOUNTER — Other Ambulatory Visit: Payer: Self-pay | Admitting: Family Medicine

## 2024-07-16 ENCOUNTER — Encounter (HOSPITAL_COMMUNITY): Payer: Self-pay | Admitting: Oncology

## 2024-07-16 ENCOUNTER — Other Ambulatory Visit: Payer: Self-pay

## 2024-07-16 ENCOUNTER — Other Ambulatory Visit (HOSPITAL_COMMUNITY): Payer: Self-pay

## 2024-07-16 ENCOUNTER — Other Ambulatory Visit: Payer: Self-pay | Admitting: Family Medicine

## 2024-07-17 ENCOUNTER — Other Ambulatory Visit: Payer: Self-pay

## 2024-07-18 ENCOUNTER — Other Ambulatory Visit (HOSPITAL_COMMUNITY): Payer: Self-pay

## 2024-07-18 MED ORDER — LOSARTAN POTASSIUM 25 MG PO TABS
25.0000 mg | ORAL_TABLET | Freq: Every day | ORAL | 3 refills | Status: AC
  Filled 2024-07-18: qty 90, 90d supply, fill #0

## 2024-07-18 MED ORDER — MISOPROSTOL 200 MCG PO TABS
200.0000 ug | ORAL_TABLET | Freq: Two times a day (BID) | ORAL | 0 refills | Status: DC | PRN
  Filled 2024-07-18: qty 180, 90d supply, fill #0

## 2024-07-19 ENCOUNTER — Other Ambulatory Visit: Payer: Self-pay

## 2024-07-19 ENCOUNTER — Ambulatory Visit: Admitting: Family Medicine

## 2024-07-19 VITALS — BP 138/74 | HR 80 | Resp 16 | Ht 71.0 in | Wt 159.0 lb

## 2024-07-19 DIAGNOSIS — M549 Dorsalgia, unspecified: Secondary | ICD-10-CM

## 2024-07-19 DIAGNOSIS — F320 Major depressive disorder, single episode, mild: Secondary | ICD-10-CM

## 2024-07-19 DIAGNOSIS — I10 Essential (primary) hypertension: Secondary | ICD-10-CM | POA: Diagnosis not present

## 2024-07-19 DIAGNOSIS — E559 Vitamin D deficiency, unspecified: Secondary | ICD-10-CM | POA: Diagnosis not present

## 2024-07-19 DIAGNOSIS — M5430 Sciatica, unspecified side: Secondary | ICD-10-CM

## 2024-07-19 DIAGNOSIS — F411 Generalized anxiety disorder: Secondary | ICD-10-CM | POA: Diagnosis not present

## 2024-07-19 MED ORDER — DICLOFENAC SODIUM 3 % EX GEL
CUTANEOUS | 3 refills | Status: AC
  Filled 2024-07-19: qty 100, 25d supply, fill #0

## 2024-07-19 MED ORDER — ESCITALOPRAM OXALATE 10 MG PO TABS
10.0000 mg | ORAL_TABLET | Freq: Every day | ORAL | 2 refills | Status: AC
  Filled 2024-07-19: qty 90, 90d supply, fill #0

## 2024-07-19 MED ORDER — CYCLOBENZAPRINE HCL 10 MG PO TABS
10.0000 mg | ORAL_TABLET | Freq: Every day | ORAL | 1 refills | Status: AC
  Filled 2024-07-19: qty 90, 90d supply, fill #0

## 2024-07-19 NOTE — Assessment & Plan Note (Signed)
 Excellent response to lexapro  at current dose , no s/e reported, continue same

## 2024-07-19 NOTE — Assessment & Plan Note (Signed)
 Change to daily tylenol  60 one to 2 tabs daily, diclofenac  gel and sytrt daily exercises at home for ;low back, hoping to stop diclofenac  tabs s/e and potential drug interactions

## 2024-07-19 NOTE — Assessment & Plan Note (Addendum)
 Controlled, no change in medication DASH diet and commitment to daily physical activity for a minimum of 30 minutes discussed and encouraged, as a part of hypertension management. The importance of attaining a healthy weight is also discussed.     07/19/2024    3:19 PM 05/31/2024    8:00 AM 04/25/2024    4:11 PM 04/25/2024    3:46 PM 03/08/2024    8:00 AM 12/15/2023    8:00 AM 11/12/2023    4:17 PM  BP/Weight  Systolic BP 138 130 138 156 126 128 132  Diastolic BP 74 81 82 89 71 77 69  Wt. (Lbs) 159 153  165.12 156 153 157.08  BMI 22.18 kg/m2 21.34 kg/m2  23.03 kg/m2 21.76 kg/m2 21.34 kg/m2 21.91 kg/m2

## 2024-07-19 NOTE — Assessment & Plan Note (Signed)
 Continue bi monthly supplement

## 2024-07-19 NOTE — Progress Notes (Signed)
" ° °  [name]     MRN: [id number]      DOB: Jan 23, 1971  Chief Complaint  Patient presents with   Hypertension    Follow up     HPI Ms. Corkins is here for follow up and re-evaluation of chronic medical conditions, medication management and review of any available recent lab and radiology data.  Preventive health is updated, specifically  Cancer screening and Immunization.   . The PT denies any adverse reactions to current medications since the last visit. Has not an excellent response to psych meds and is functioning at a much improved level There are no new concerns.  There are no specific complaints   ROS Denies recent fever or chills. Denies sinus pressure, nasal congestion, ear pain or sore throat. Denies chest congestion, productive cough or wheezing. Denies chest pains, palpitations and leg swelling Denies abdominal pain, nausea, vomiting,diarrhea or constipation.   Denies dysuria, frequency, hesitancy or incontinence. Denies joint pain, swelling and limitation in mobility. Denies headaches, seizures, numbness, or tingling. Denies uncontrolled depression, anxiety or insomnia. Denies skin break down or rash.   PE  BP 138/74   Pulse 80   Resp 16   Ht 5' 11 (1.803 m)   Wt 159 lb (72.1 kg)   SpO2 96%   BMI 22.18 kg/m   Patient alert and oriented and in no cardiopulmonary distress.  HEENT: No facial asymmetry, EOMI,     Neck supple .  Chest: Clear to auscultation bilaterally.  CVS: S1, S2 no murmurs, no S3.Regular rate.  ABD: Soft non tender.   Ext: No edema  MS: Adequate ROM spine, shoulders, hips and knees.  Skin: Intact, no ulcerations or rash noted.  Psych: Good eye contact, normal affect. Memory intact not anxious or depressed appearing.  CNS: CN 2-12 intact, power,  normal throughout.no focal deficits noted.   Assessment & Plan  Depression, major, single episode, mild Excellent response to lexapro  continue at current dose  GAD  (generalized anxiety disorder) Excellent response to lexapro  at current dose , no s/e reported, continue same  Hypertension Controlled, no change in medication DASH diet and commitment to daily physical activity for a minimum of 30 minutes discussed and encouraged, as a part of hypertension management. The importance of attaining a healthy weight is also discussed.     07/19/2024    3:19 PM 05/31/2024    8:00 AM 04/25/2024    4:11 PM 04/25/2024    3:46 PM 03/08/2024    8:00 AM 12/15/2023    8:00 AM 11/12/2023    4:17 PM  BP/Weight  Systolic BP 138 130 138 156 126 128 132  Diastolic BP 74 81 82 89 71 77 69  Wt. (Lbs) 159 153  165.12 156 153 157.08  BMI 22.18 kg/m2 21.34 kg/m2  23.03 kg/m2 21.76 kg/m2 21.34 kg/m2 21.91 kg/m2       Vitamin D  deficiency Continue bi monthly supplement  Back pain with sciatica Change to daily tylenol  60 one to 2 tabs daily, diclofenac  gel and sytrt daily exercises at home for ;low back, hoping to stop diclofenac  tabs s/e and potential drug interactions   "

## 2024-07-19 NOTE — Assessment & Plan Note (Signed)
 Excellent response to lexapro  continue at current dose

## 2024-07-19 NOTE — Patient Instructions (Signed)
 F/u in 6 months  Please schedule mammogram at checkout  Stop diclofenac  tablets  and misoprostol , and start diclofenac  gel   Use tylenol  650 mg tab once daily, pelvic and low back exercises every day, let me know if not good  Continue all other meds as before  Thankful much better  It is important that you exercise regularly at least 30 minutes 5 times a week. If you develop chest pain, have severe difficulty breathing, or feel very tired, stop exercising immediately and seek medical attention   Thanks for choosing [facility], we consider it a privelige to serve you.

## 2024-07-20 ENCOUNTER — Other Ambulatory Visit: Payer: Self-pay

## 2024-07-20 ENCOUNTER — Other Ambulatory Visit (HOSPITAL_COMMUNITY): Payer: Self-pay

## 2024-07-21 ENCOUNTER — Other Ambulatory Visit: Payer: Self-pay

## 2024-08-23 ENCOUNTER — Ambulatory Visit (INDEPENDENT_AMBULATORY_CARE_PROVIDER_SITE_OTHER): Admitting: Adult Health

## 2025-01-16 ENCOUNTER — Ambulatory Visit: Payer: Self-pay | Admitting: Family Medicine
